# Patient Record
Sex: Male | Born: 1940 | Race: White | Hispanic: No | Marital: Married | Smoking: Former smoker
Health system: Southern US, Community
[De-identification: ages and names within clinical notes are randomized; demographics above are authoritative.]

## PROBLEM LIST (undated history)

## (undated) DIAGNOSIS — M75101 Unspecified rotator cuff tear or rupture of right shoulder, not specified as traumatic: Secondary | ICD-10-CM

## (undated) DIAGNOSIS — I10 Essential (primary) hypertension: Secondary | ICD-10-CM

## (undated) DIAGNOSIS — Z973 Presence of spectacles and contact lenses: Secondary | ICD-10-CM

## (undated) DIAGNOSIS — M12811 Other specific arthropathies, not elsewhere classified, right shoulder: Secondary | ICD-10-CM

## (undated) DIAGNOSIS — G4733 Obstructive sleep apnea (adult) (pediatric): Secondary | ICD-10-CM

## (undated) DIAGNOSIS — R569 Unspecified convulsions: Secondary | ICD-10-CM

## (undated) DIAGNOSIS — T7840XA Allergy, unspecified, initial encounter: Secondary | ICD-10-CM

## (undated) DIAGNOSIS — Z974 Presence of external hearing-aid: Secondary | ICD-10-CM

## (undated) DIAGNOSIS — I495 Sick sinus syndrome: Secondary | ICD-10-CM

## (undated) DIAGNOSIS — D494 Neoplasm of unspecified behavior of bladder: Secondary | ICD-10-CM

## (undated) DIAGNOSIS — Z85828 Personal history of other malignant neoplasm of skin: Secondary | ICD-10-CM

## (undated) DIAGNOSIS — Z8673 Personal history of transient ischemic attack (TIA), and cerebral infarction without residual deficits: Secondary | ICD-10-CM

## (undated) DIAGNOSIS — K219 Gastro-esophageal reflux disease without esophagitis: Secondary | ICD-10-CM

## (undated) DIAGNOSIS — E785 Hyperlipidemia, unspecified: Secondary | ICD-10-CM

## (undated) DIAGNOSIS — Z950 Presence of cardiac pacemaker: Secondary | ICD-10-CM

## (undated) DIAGNOSIS — C801 Malignant (primary) neoplasm, unspecified: Secondary | ICD-10-CM

## (undated) DIAGNOSIS — Z860101 Personal history of adenomatous and serrated colon polyps: Secondary | ICD-10-CM

## (undated) DIAGNOSIS — M1990 Unspecified osteoarthritis, unspecified site: Secondary | ICD-10-CM

## (undated) DIAGNOSIS — Z859 Personal history of malignant neoplasm, unspecified: Secondary | ICD-10-CM

## (undated) DIAGNOSIS — Z86010 Personal history of colonic polyps: Secondary | ICD-10-CM

## (undated) DIAGNOSIS — G459 Transient cerebral ischemic attack, unspecified: Secondary | ICD-10-CM

## (undated) DIAGNOSIS — Z9889 Other specified postprocedural states: Secondary | ICD-10-CM

## (undated) DIAGNOSIS — G4730 Sleep apnea, unspecified: Secondary | ICD-10-CM

## (undated) DIAGNOSIS — R351 Nocturia: Secondary | ICD-10-CM

## (undated) DIAGNOSIS — G3184 Mild cognitive impairment, so stated: Secondary | ICD-10-CM

## (undated) DIAGNOSIS — G2581 Restless legs syndrome: Secondary | ICD-10-CM

## (undated) DIAGNOSIS — Z87898 Personal history of other specified conditions: Secondary | ICD-10-CM

## (undated) HISTORY — DX: Essential (primary) hypertension: I10

## (undated) HISTORY — PX: PACEMAKER PLACEMENT: SHX43

## (undated) HISTORY — DX: Gastro-esophageal reflux disease without esophagitis: K21.9

## (undated) HISTORY — DX: Unspecified osteoarthritis, unspecified site: M19.90

## (undated) HISTORY — DX: Allergy, unspecified, initial encounter: T78.40XA

## (undated) HISTORY — DX: Sleep apnea, unspecified: G47.30

## (undated) HISTORY — DX: Sick sinus syndrome: I49.5

## (undated) HISTORY — DX: Unspecified convulsions: R56.9

## (undated) HISTORY — PX: COLONOSCOPY: SHX174

## (undated) HISTORY — PX: SKIN CANCER EXCISION: SHX779

## (undated) HISTORY — DX: Hyperlipidemia, unspecified: E78.5

## (undated) HISTORY — PX: POLYPECTOMY: SHX149

---

## 2000-07-29 ENCOUNTER — Encounter (INDEPENDENT_AMBULATORY_CARE_PROVIDER_SITE_OTHER): Payer: Self-pay | Admitting: *Deleted

## 2000-07-29 ENCOUNTER — Ambulatory Visit (HOSPITAL_COMMUNITY): Admission: RE | Admit: 2000-07-29 | Discharge: 2000-07-29 | Payer: Self-pay | Admitting: Gastroenterology

## 2003-04-21 DIAGNOSIS — Z87898 Personal history of other specified conditions: Secondary | ICD-10-CM

## 2003-04-21 HISTORY — DX: Personal history of other specified conditions: Z87.898

## 2004-04-23 ENCOUNTER — Ambulatory Visit: Payer: Self-pay | Admitting: Cardiology

## 2004-04-30 ENCOUNTER — Ambulatory Visit: Payer: Self-pay

## 2005-02-17 ENCOUNTER — Ambulatory Visit (HOSPITAL_BASED_OUTPATIENT_CLINIC_OR_DEPARTMENT_OTHER): Admission: RE | Admit: 2005-02-17 | Discharge: 2005-02-17 | Payer: Self-pay | Admitting: Family Medicine

## 2005-02-22 ENCOUNTER — Ambulatory Visit: Payer: Self-pay | Admitting: Internal Medicine

## 2005-08-18 ENCOUNTER — Ambulatory Visit (HOSPITAL_BASED_OUTPATIENT_CLINIC_OR_DEPARTMENT_OTHER): Admission: RE | Admit: 2005-08-18 | Discharge: 2005-08-18 | Payer: Self-pay | Admitting: Family Medicine

## 2005-08-23 ENCOUNTER — Ambulatory Visit: Payer: Self-pay | Admitting: Internal Medicine

## 2007-08-10 ENCOUNTER — Encounter: Payer: Self-pay | Admitting: Family Medicine

## 2007-12-03 ENCOUNTER — Emergency Department (HOSPITAL_COMMUNITY): Admission: EM | Admit: 2007-12-03 | Discharge: 2007-12-03 | Payer: Self-pay | Admitting: Emergency Medicine

## 2008-04-20 DIAGNOSIS — I495 Sick sinus syndrome: Secondary | ICD-10-CM

## 2008-04-20 HISTORY — DX: Sick sinus syndrome: I49.5

## 2008-04-27 LAB — HM COLONOSCOPY

## 2008-05-02 ENCOUNTER — Encounter: Payer: Self-pay | Admitting: Internal Medicine

## 2008-07-17 ENCOUNTER — Ambulatory Visit: Payer: Self-pay | Admitting: Family Medicine

## 2008-07-17 DIAGNOSIS — E785 Hyperlipidemia, unspecified: Secondary | ICD-10-CM | POA: Insufficient documentation

## 2008-07-17 DIAGNOSIS — I10 Essential (primary) hypertension: Secondary | ICD-10-CM

## 2008-07-17 HISTORY — DX: Hyperlipidemia, unspecified: E78.5

## 2008-07-17 HISTORY — DX: Essential (primary) hypertension: I10

## 2008-07-24 ENCOUNTER — Telehealth: Payer: Self-pay | Admitting: Family Medicine

## 2008-07-24 DIAGNOSIS — R55 Syncope and collapse: Secondary | ICD-10-CM | POA: Insufficient documentation

## 2008-07-25 LAB — CONVERTED CEMR LAB
ALT: 18 units/L (ref 0–53)
AST: 24 units/L (ref 0–37)
Albumin: 4 g/dL (ref 3.5–5.2)
Alkaline Phosphatase: 43 units/L (ref 39–117)
BUN: 14 mg/dL (ref 6–23)
Bilirubin, Direct: 0 mg/dL (ref 0.0–0.3)
CO2: 33 meq/L — ABNORMAL HIGH (ref 19–32)
Calcium: 9.3 mg/dL (ref 8.4–10.5)
Chloride: 100 meq/L (ref 96–112)
Cholesterol: 263 mg/dL — ABNORMAL HIGH (ref 0–200)
Creatinine, Ser: 0.8 mg/dL (ref 0.4–1.5)
Direct LDL: 185.1 mg/dL
GFR calc non Af Amer: 103 mL/min (ref >60)
Glucose, Bld: 98 mg/dL (ref 70–99)
HDL: 58 mg/dL (ref >39.00)
PSA: 0.84 ng/mL (ref 0.10–4.00)
Potassium: 4.9 meq/L (ref 3.5–5.1)
Sodium: 138 meq/L (ref 135–145)
Total Bilirubin: 1.1 mg/dL (ref 0.3–1.2)
Total CHOL/HDL Ratio: 5
Total Protein: 7 g/dL (ref 6.0–8.3)
Triglycerides: 104 mg/dL (ref 0.0–149.0)
VLDL: 20.8 mg/dL (ref 0.0–40.0)

## 2008-08-14 ENCOUNTER — Ambulatory Visit: Payer: Self-pay | Admitting: Internal Medicine

## 2008-08-15 LAB — CONVERTED CEMR LAB
BUN: 13 mg/dL (ref 6–23)
Basophils Absolute: 0 10*3/uL (ref 0.0–0.1)
Basophils Relative: 0.5 % (ref 0.0–3.0)
CO2: 32 meq/L (ref 19–32)
Calcium: 8.9 mg/dL (ref 8.4–10.5)
Chloride: 100 meq/L (ref 96–112)
Creatinine, Ser: 0.9 mg/dL (ref 0.4–1.5)
Eosinophils Absolute: 0.1 10*3/uL (ref 0.0–0.7)
Eosinophils Relative: 2.1 % (ref 0.0–5.0)
GFR calc non Af Amer: 89.89 mL/min (ref >60)
Glucose, Bld: 91 mg/dL (ref 70–99)
HCT: 43.5 % (ref 39.0–52.0)
Hemoglobin: 15 g/dL (ref 13.0–17.0)
INR: 1 (ref 0.8–1.0)
Lymphocytes Relative: 24.8 % (ref 12.0–46.0)
Lymphs Abs: 1.5 10*3/uL (ref 0.7–4.0)
MCHC: 34.6 g/dL (ref 30.0–36.0)
MCV: 95.4 fL (ref 78.0–100.0)
Monocytes Absolute: 0.5 10*3/uL (ref 0.1–1.0)
Monocytes Relative: 7.7 % (ref 3.0–12.0)
Neutro Abs: 4 10*3/uL (ref 1.4–7.7)
Neutrophils Relative %: 64.9 % (ref 43.0–77.0)
Platelets: 176 10*3/uL (ref 150.0–400.0)
Potassium: 4.2 meq/L (ref 3.5–5.1)
Prothrombin Time: 10.5 s — ABNORMAL LOW (ref 10.9–13.3)
RBC: 4.56 M/uL (ref 4.22–5.81)
RDW: 13.6 % (ref 11.5–14.6)
Sodium: 138 meq/L (ref 135–145)
WBC: 6.1 10*3/uL (ref 4.5–10.5)
aPTT: 26.2 s (ref 21.7–28.8)

## 2008-08-17 ENCOUNTER — Ambulatory Visit: Payer: Self-pay | Admitting: Internal Medicine

## 2008-08-17 ENCOUNTER — Ambulatory Visit (HOSPITAL_COMMUNITY): Admission: RE | Admit: 2008-08-17 | Discharge: 2008-08-17 | Payer: Self-pay | Admitting: Internal Medicine

## 2008-08-17 HISTORY — PX: LOOP RECORDER IMPLANT: SHX5954

## 2008-08-20 ENCOUNTER — Encounter: Payer: Self-pay | Admitting: Internal Medicine

## 2008-09-03 ENCOUNTER — Encounter: Payer: Self-pay | Admitting: Internal Medicine

## 2008-09-03 ENCOUNTER — Ambulatory Visit: Payer: Self-pay

## 2008-09-05 ENCOUNTER — Telehealth (INDEPENDENT_AMBULATORY_CARE_PROVIDER_SITE_OTHER): Payer: Self-pay | Admitting: *Deleted

## 2008-12-18 ENCOUNTER — Ambulatory Visit: Payer: Self-pay | Admitting: Internal Medicine

## 2008-12-18 DIAGNOSIS — Z950 Presence of cardiac pacemaker: Secondary | ICD-10-CM

## 2008-12-18 HISTORY — DX: Presence of cardiac pacemaker: Z95.0

## 2009-08-02 ENCOUNTER — Encounter: Payer: Self-pay | Admitting: Family Medicine

## 2009-09-30 ENCOUNTER — Ambulatory Visit: Payer: Self-pay | Admitting: Family Medicine

## 2009-10-11 ENCOUNTER — Ambulatory Visit: Payer: Self-pay | Admitting: Family Medicine

## 2009-10-11 DIAGNOSIS — R071 Chest pain on breathing: Secondary | ICD-10-CM | POA: Insufficient documentation

## 2009-11-25 ENCOUNTER — Telehealth: Payer: Self-pay | Admitting: *Deleted

## 2009-12-24 ENCOUNTER — Ambulatory Visit: Payer: Self-pay | Admitting: Internal Medicine

## 2009-12-26 ENCOUNTER — Telehealth: Payer: Self-pay | Admitting: Internal Medicine

## 2010-01-01 ENCOUNTER — Telehealth: Payer: Self-pay | Admitting: Internal Medicine

## 2010-01-13 ENCOUNTER — Ambulatory Visit: Payer: Self-pay | Admitting: Internal Medicine

## 2010-01-16 ENCOUNTER — Encounter: Payer: Self-pay | Admitting: Family Medicine

## 2010-01-20 ENCOUNTER — Ambulatory Visit: Payer: Self-pay | Admitting: Internal Medicine

## 2010-01-20 LAB — CONVERTED CEMR LAB
BUN: 12 mg/dL (ref 6–23)
Basophils Absolute: 0 10*3/uL (ref 0.0–0.1)
Basophils Relative: 0.5 % (ref 0.0–3.0)
CO2: 30 meq/L (ref 19–32)
Calcium: 9 mg/dL (ref 8.4–10.5)
Chloride: 94 meq/L — ABNORMAL LOW (ref 96–112)
Creatinine, Ser: 0.8 mg/dL (ref 0.4–1.5)
Eosinophils Absolute: 0.2 10*3/uL (ref 0.0–0.7)
Eosinophils Relative: 2.9 % (ref 0.0–5.0)
GFR calc non Af Amer: 99.65 mL/min (ref >60)
Glucose, Bld: 101 mg/dL — ABNORMAL HIGH (ref 70–99)
HCT: 42.3 % (ref 39.0–52.0)
Hemoglobin: 14.7 g/dL (ref 13.0–17.0)
INR: 0.9 (ref 0.8–1.0)
Lymphocytes Relative: 22.7 % (ref 12.0–46.0)
Lymphs Abs: 1.9 10*3/uL (ref 0.7–4.0)
MCHC: 34.8 g/dL (ref 30.0–36.0)
MCV: 96.8 fL (ref 78.0–100.0)
Monocytes Absolute: 0.8 10*3/uL (ref 0.1–1.0)
Monocytes Relative: 9 % (ref 3.0–12.0)
Neutro Abs: 5.5 10*3/uL (ref 1.4–7.7)
Neutrophils Relative %: 64.9 % (ref 43.0–77.0)
Platelets: 179 10*3/uL (ref 150.0–400.0)
Potassium: 5.3 meq/L — ABNORMAL HIGH (ref 3.5–5.1)
Prothrombin Time: 10.1 s (ref 9.7–11.8)
RBC: 4.37 M/uL (ref 4.22–5.81)
RDW: 13.8 % (ref 11.5–14.6)
Sodium: 133 meq/L — ABNORMAL LOW (ref 135–145)
WBC: 8.4 10*3/uL (ref 4.5–10.5)
aPTT: 26.4 s (ref 21.7–28.8)

## 2010-01-27 ENCOUNTER — Ambulatory Visit: Payer: Self-pay | Admitting: Internal Medicine

## 2010-01-27 ENCOUNTER — Ambulatory Visit (HOSPITAL_COMMUNITY): Admission: RE | Admit: 2010-01-27 | Discharge: 2010-01-27 | Payer: Self-pay | Admitting: Internal Medicine

## 2010-01-27 HISTORY — PX: CARDIAC PACEMAKER PLACEMENT: SHX583

## 2010-01-28 ENCOUNTER — Telehealth: Payer: Self-pay | Admitting: Internal Medicine

## 2010-01-30 ENCOUNTER — Encounter: Payer: Self-pay | Admitting: Internal Medicine

## 2010-02-12 ENCOUNTER — Ambulatory Visit: Payer: Self-pay

## 2010-02-12 ENCOUNTER — Encounter: Payer: Self-pay | Admitting: Internal Medicine

## 2010-04-28 ENCOUNTER — Ambulatory Visit
Admission: RE | Admit: 2010-04-28 | Discharge: 2010-04-28 | Payer: Self-pay | Source: Home / Self Care | Attending: Internal Medicine | Admitting: Internal Medicine

## 2010-04-28 ENCOUNTER — Encounter: Payer: Self-pay | Admitting: Internal Medicine

## 2010-05-05 ENCOUNTER — Encounter: Payer: Self-pay | Admitting: Internal Medicine

## 2010-05-20 NOTE — Cardiovascular Report (Signed)
Summary: Office Visit   Office Visit   Imported By: Roderic Ovens 02/27/2010 14:36:36  _____________________________________________________________________  External Attachment:    Type:   Image     Comment:   External Document

## 2010-05-20 NOTE — Letter (Signed)
Summary: Implantable Device Instructions  Architectural technologist, Main Office  [address]   [place], [place] [address]   Phone: [phone]  Fax: [phone]      Implantable Device Instructions  You are scheduled for:  _X____ Permanent Transvenous Pacemaker REMOVAL OF LOOP RECORDER _____ Implantable Cardioverter Defibrillator _____ Implantable Loop Recorder _____ Generator Change  on _10/10/11____ with Dr. __KLEIN___.  1.  Please arrive at the Short Stay Center at [facility] at _8:00____ on the day of your procedure.  2.  Do not eat or drink the night before your procedure.  3.  Complete lab work on __10/3/11___.  The lab at [facility] is open from 8:30 AM to 1:30 PM and from 2:30 PM to 5:00 PM.  The lab at [facility] is open from 7:30 AM to 5:30 PM.  You do not have to be fasting.   4.  Plan for an overnight stay.  Bring your insurance cards and a list of your medications.  5.  Wash your chest and neck with antibacterial soap (any brand) the evening before and the morning of your procedure.  Rinse well.  6 .  Education material received:     Pacemaker ___X__           ICD _____           Arrhythmia _____  *If you have ANY questions after you get home, please call the office [phone].  *Every attempt is made to prevent procedures from being rescheduled.  Due to the nauture of Electrophysiology, rescheduling can happen.  The physician is always aware and directs the staff when this occurs.

## 2010-05-20 NOTE — Cardiovascular Report (Signed)
Summary: Office Visit   Office Visit   Imported By: Roderic Ovens 11/12/2008 14:03:04  _____________________________________________________________________  External Attachment:    Type:   Image     Comment:   External Document

## 2010-05-20 NOTE — Progress Notes (Signed)
Summary: Shingles vaccine offered, LMTCB  Phone Note Outgoing Call   Call placed by: Sid Falcon LPN,  November 25, 2009 4:39 PM Call placed to: Patient Summary of Call: Pt on list to get Shingles vaccine. LMTCB if he would like the Zostavax.  Pt was informed on $290.00 and th check with his Insurance Co.  He will need to sign a form agreeing to pay if insurance does not. Initial call taken by: Sid Falcon LPN,  November 25, 2009 4:41 PM  Follow-up for Phone Call        attempt to call - ans mach - LMTCB by Monday 12noon if interested in rec'ing shingles vaccine. If not  we will remove from list  . Informed about cost , calling ins and signing ppwk at time of injections. KIK Follow-up by: Duard Brady LPN,  November 29, 2009 9:29 AM

## 2010-05-20 NOTE — Cardiovascular Report (Signed)
Summary: Asytole Episode  Asytole Episode   Imported By: Marylou Mccoy 01/27/2010 13:53:16  _____________________________________________________________________  External Attachment:    Type:   Image     Comment:   External Document

## 2010-05-20 NOTE — Procedures (Signed)
Summary: 10-14 day pacemaker ck/mt   Current Medications (verified): 1)  Benazepril Hcl 40 Mg Tabs (Benazepril Hcl) .... Once Daily 2)  Centrum Ultra Mens  Tabs (Multiple Vitamins-Minerals) .... Once Daily 3)  Co Q-10 Plus Red Yeast Rice 60-600 Mg Caps (Coenzyme Q10-Red Yeast Rice) .... Once Daily 4)  Vitamin C 500 Mg Tabs (Ascorbic Acid) .Marland Kitchen.. 1 Once Daily 5)  Fish Oil 1000 Mg Caps (Omega-3 Fatty Acids) .... Take 1 Capsule By Mouth Once A Day 6)  Crestor 5 Mg Tabs (Rosuvastatin Calcium) .... Take 1 Tablet By Mouth Every Other Day 7)  Red Yeast Rice 600 Mg Tabs (Red Yeast Rice Extract) .... Take 1 Tablet By Mouth Once A Day 8)  Saw Palmetto 500 Mg Caps (Saw Palmetto (Serenoa Repens)) .... Take 1 Capsule By Mouth Once A Day 9)  Naproxen Sodium 220 Mg Tabs (Naproxen Sodium) .... Once Daily 10)  Glucosamine-Chondroitin 1500-1200 Mg/11ml Liqd (Glucosamine-Chondroitin) .... Once Daily 11)  Super B-50 B Complex  Caps (B Complex-Biotin-Fa) .Marland Kitchen.. 1 Once Daily 12)  Prostate Therapy Complex  Caps (Misc Natural Products) .Marland Kitchen.. 1 Once Daily  Allergies (verified): No Known Drug Allergies  PPM Specifications Following MD:  Sherryl Manges, MD     PPM Vendor:  St Jude     PPM Model Number:  [id number]     PPM Serial Number:  [id number] PPM DOI:  01/27/2010     PPM Implanting MD:  Sherryl Manges, MD  Lead 1    Location: RA     DOI: 01/27/2010     Model #: [id number]     Serial #: [id number]     Status: active Lead 2    Location: RV     DOI: 01/27/2010     Model #: [id number]     Serial #: [id number]     Status: active  Magnet Response Rate:  BOL 100 ERI 85  Indications:  Sick sinus syndrome   PPM Follow Up Battery Voltage:  3.01 V     Battery Est. Longevity:  6.7-13.2 yrs       PPM Device Measurements Atrium  Amplitude: 4.7 mV, Impedance: 410 ohms, Threshold: 0.75 V at 0.4 msec Right Ventricle  Amplitude: 12.0 mV, Impedance: 480 ohms, Threshold: 0.75 V at 0.4 msec  Episodes MS Episodes:  0     Ventricular High Rate:   0     Atrial Pacing:  1.9%     Ventricular Pacing:  <1%  Parameters Mode:  DDD     Lower Rate Limit:  50     Upper Rate Limit:  110 Paced AV Delay:  250     Sensed AV Delay:  250 Next Cardiology Appt Due:  04/21/2010 Tech Comments:  WOUND CHECK---STERI STRIPS REMOVED.  NO REDNESS OR SWELLING AT SITE.  NORMAL DEVICE FUNCTION.  NO CHANGES MADE. ROV IN [place] W/SK. Vella Kohler  February 12, 2010 12:54 PM  ILR Following MD Sherryl Manges, MD DOI:  08/17/2008 Vendor:  St Jude     Model Number:  [id number]     Serial Number [id number]

## 2010-05-20 NOTE — Assessment & Plan Note (Signed)
Summary: cpx/pt will come in fasting/njr   Vital Signs:  Patient profile:   70 year old male Height:      68 inches Weight:      182 pounds BMI:     27.77 Temp:     97.7 degrees F oral Pulse rate:   72 / minute Pulse rhythm:   regular Resp:     12 per minute BP sitting:   130 / 72  (left arm) Cuff size:   regular  Vitals Entered By: Sid Falcon LPN (September 30, 2009 10:38 AM)  Nutrition Counseling: Patient's BMI is greater than 25 and therefore counseled on weight management options. CC: CPX, pt fasting   History of Present Illness: Here for CPE.  Colonoscopy 2004.  Tetanus up to date.  PVX 2 years ago. No Zostavax.  Exercises most days.  Hx of syncopal episodes but none recently.   Workup per cardiology-s/p loop recorder implantation.  Recent labs per [place] reviewed and no signif abnormality noted.  PMH, SH , AND FH REVIEWED.  Allergies (verified): No Known Drug Allergies  Past History:  Past Medical History: Last updated: 08/20/2008 Hyperlipidemia Hypertension hearing loss Sleep Apnea 2007  Family History: Last updated: 07/17/2008 Family History Hypertension parent Alcoholism, grandparent Stroke, parent  Social History: Last updated: 08/07/2008 Occupation:  Psychologist, sport and exercise Alcohol use-yes--2- beers every day Regular exercise-yes Tobacco Use - Former. --quit 1984  Risk Factors: Exercise: yes (07/17/2008)  Risk Factors: Smoking Status: quit (08/07/2008)  Review of Systems  The patient denies anorexia, fever, weight loss, weight gain, vision loss, decreased hearing, hoarseness, chest pain, syncope, dyspnea on exertion, peripheral edema, prolonged cough, headaches, hemoptysis, abdominal pain, melena, hematochezia, severe indigestion/heartburn, hematuria, incontinence, genital sores, muscle weakness, suspicious skin lesions, transient blindness, difficulty walking, depression, unusual weight change, enlarged lymph nodes, and testicular masses.    Physical  Exam  General:  Well-developed,well-nourished,in no acute distress; alert,appropriate and cooperative throughout examination Head:  Normocephalic and atraumatic without obvious abnormalities. No apparent alopecia or balding. Eyes:  pupils equal, pupils round, and pupils reactive to light.   Ears:  External ear exam shows no significant lesions or deformities.  Otoscopic examination reveals clear canals, tympanic membranes are intact bilaterally without bulging, retraction, inflammation or discharge. Hearing is grossly normal bilaterally. Mouth:  Oral mucosa and oropharynx without lesions or exudates.  Teeth in good repair. Neck:  No deformities, masses, or tenderness noted. Lungs:  Normal respiratory effort, chest expands symmetrically. Lungs are clear to auscultation, no crackles or wheezes. Heart:  Normal rate and regular rhythm. S1 and S2 normal without gallop, murmur, click, rub or other extra sounds. Abdomen:  Bowel sounds positive,abdomen soft and non-tender without masses, organomegaly or hernias noted. Rectal:  No external abnormalities noted. Normal sphincter tone. No rectal masses or tenderness. Prostate:  Prostate gland firm and smooth, no enlargement, nodularity, tenderness, mass, asymmetry or induration. Extremities:  No clubbing, cyanosis, edema, or deformity noted with normal full range of motion of all joints.     Impression & Recommendations:  Problem # 1:  ROUTINE GENERAL MEDICAL EXAM@HEALTH  CARE FACL (ICD-V70.0)  Complete Medication List: 1)  Benazepril Hcl 40 Mg Tabs (Benazepril hcl) .... Once daily 2)  Centrum Ultra Mens Tabs (Multiple vitamins-minerals) .... Once daily 3)  Co Q-10 Plus Red Yeast Rice 60-600 Mg Caps (Coenzyme q10-red yeast rice) .... Once daily 4)  Vitamin C Cr 1000 Mg Cr-tabs (Ascorbic acid) .... Once daily 5)  Fish Oil 1000 Mg Caps (Omega-3 fatty acids) .... Take  1 capsule by mouth once a day 6)  Crestor 10 Mg Tabs (Rosuvastatin calcium) .... Take one  tablet by mouth every other day 7)  Red Yeast Rice 600 Mg Tabs (Red yeast rice extract) .... Take 1 tablet by mouth once a day 8)  Saw Palmetto 500 Mg Caps (Saw palmetto (serenoa repens)) .... Take 1 capsule by mouth once a day 9)  Naproxen Sodium 220 Mg Tabs (Naproxen sodium) .... Once daily 10)  Glucosamine-chondroitin 1500-1200 Mg/32ml Liqd (Glucosamine-chondroitin) .... Once daily  Patient Instructions: 1)  It is important that you exercise reguarly at least 20 minutes 5 times a week. If you develop chest pain, have severe difficulty breathing, or feel very tired, stop exercising immediately and seek medical attention.

## 2010-05-20 NOTE — Cardiovascular Report (Signed)
Summary: Office Visit   Office Visit   Imported By: Roderic Ovens 01/14/2010 15:01:19  _____________________________________________________________________  External Attachment:    Type:   Image     Comment:   External Document

## 2010-05-20 NOTE — Progress Notes (Signed)
Summary: echo results  Phone Note Outgoing Call Call back at [facility] Phone [phone]   Call placed by: Gypsy Balsam RN BSN,  Sep 05, 2008 10:38 AM Call placed to: Patient Summary of Call: Pt's spouse notified of echo results Initial call taken by: Gypsy Balsam RN BSN,  Sep 05, 2008 10:38 AM

## 2010-05-20 NOTE — Assessment & Plan Note (Signed)
Summary: 3 month/dmp   Visit Type:  Follow-up Primary Provider:  Evelena Peat MD  CC:  device check.  History of Present Illness:     [name] [name] is seen because of recurrent syncope. He has had 3 episodes dating back to last 5 years.    he is s/p loop recorder implantation and is without recurrent events  Current Medications (verified): 1)  Benazepril Hcl 40 Mg Tabs (Benazepril Hcl) .... Once Daily 2)  Centrum Ultra Mens  Tabs (Multiple Vitamins-Minerals) .... Once Daily 3)  Co Q-10 Plus Red Yeast Rice 60-600 Mg Caps (Coenzyme Q10-Red Yeast Rice) .... Once Daily 4)  Vitamin C Cr 1000 Mg Cr-Tabs (Ascorbic Acid) .... Once Daily 5)  Fish Oil 1000 Mg Caps (Omega-3 Fatty Acids) .... Take 1 Capsule By Mouth Once A Day 6)  Crestor 10 Mg Tabs (Rosuvastatin Calcium) .... Take One Tablet By Mouth Every Other Day 7)  Red Yeast Rice 600 Mg Tabs (Red Yeast Rice Extract) .... Take 1 Tablet By Mouth Once A Day 8)  Saw Palmetto 500 Mg Caps (Saw Palmetto (Serenoa Repens)) .... Take 1 Capsule By Mouth Once A Day  Allergies (verified): No Known Drug Allergies  Vital Signs:  Patient profile:   70 year old male Height:      68 inches Weight:      177 pounds BMI:     27.28 Pulse rate:   68 / minute Pulse rhythm:   regular Resp:     18 per minute BP sitting:   112 / 66  (left arm) Cuff size:   large  Vitals Entered By: Vikki Ports (December 18, 2008 10:33 AM)  Physical Exam  General:  The patient was alert and oriented in no acute distress. Neck veins were flat, carotids were brisk.  Lungs were clear.  Heart sounds were regular without murmurs or gallops.  Abdomen was soft with active bowel sounds. There is no clubbing cyanosis or edema. Skin Warm and dry\par     ILR Following MD Sherryl Manges, MD DOI:  08/17/2008 Vendor:  [employer] Jude     Model Number:  [id number]     Serial Number [id number]       Tachy Episodes:  1     Brady Episodes:  72 ILR Next Due 03/20/2009  Tech Comments:  All  bradycardia and asystole episodes are undersensing.  Dr Graciela Husbands to interpret tachy episode. Gypsy Balsam RN BSN  December 18, 2008 10:57 AM   Impression & Recommendations:  Problem # 1:  SYNCOPE (ICD-780.2) No recurrences His updated medication list for this problem includes:    Benazepril Hcl 40 Mg Tabs (Benazepril hcl) ..... Once daily  Problem # 2:  IMPLANTABLE LOOP RECORDER (ICD-V45.09) interrogation demonstrated multiple events, both undersensing and oversensing.  There was no syncope and I am interpreting all of the events as artifact.  I have advised him that coming for syncope is important and general surveillance will likely be of little benefit because of the sensing issues  Patient Instructions: 1)  Your physician recommends that you schedule a follow-up appointment in: 12 months
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
## 2010-05-20 NOTE — Miscellaneous (Signed)
Summary: Living Will and Healthcare Power of Attorney  Living Will and Healthcare Power of Attorney   Imported By: Maryln Gottron 01/31/2010 12:21:30  _____________________________________________________________________  External Attachment:    Type:   Image     Comment:   External Document

## 2010-05-20 NOTE — Progress Notes (Signed)
Summary: Question about pt visit and pacemaker  Phone Note Call from Patient Call back at Home Phone [phone] Call back at [phone]   Caller: Spouse/Peggy Summary of Call: Ptv wife calling regarding the pt visit  on 11/23/09 calling with question about pacemaker Initial call taken by: Judie Grieve,  December 26, 2009 2:43 PM  Follow-up for Phone Call        12/26/09--3PM--dr klein and christine--had a coversation today with mr and mrs Mirabella, who is sched for pacer implant on 01/27/10--this couple do not appear to feel mr Spellman needs pacer--they had general ? about electrical activity of heart ( is dr Graciela Husbands sure that mr Lobato's heart accually stops--could the recorder be broken--how can he feel so good, be able to exercise without any problems--he hasn't had syncope since 2010, could the disorder be gone--etc--they don't seem to feel that he needs the pacer right now--i attempted to answer their ?, but feel they don't want to have this done--i advised i would speak to dr Graciela Husbands and cristine to see what they suggest--this couple also asked if they should get second opinion--i spoke with dr Graciela Husbands about all of the above and he stated he would call this family--nt Follow-up by: Ledon Snare, RN,  December 26, 2009 3:26 PM

## 2010-05-20 NOTE — Procedures (Signed)
Summary: eph./ wound check. gd   Current Medications (verified): 1)  Benazepril Hcl 40 Mg Tabs (Benazepril Hcl) .... Once Daily 2)  Omega-3 350 Mg Caps (Omega-3 Fatty Acids) .... Once Daily 3)  Centrum Ultra Mens  Tabs (Multiple Vitamins-Minerals) .... Once Daily 4)  Co Q-10 Plus Red Yeast Rice 60-600 Mg Caps (Coenzyme Q10-Red Yeast Rice) .... Once Daily 5)  Vitamin C Cr 1000 Mg Cr-Tabs (Ascorbic Acid) .... Once Daily 6)  Crestor 5 Mg Tabs (Rosuvastatin Calcium) .... Take One Tablet By Mouth Every Other Day  Allergies (verified): 1)  ! * No Shellfish Allergy 2)  ! * No Latex Allergy 3)  ! * No Shellfish Allergy   ILR   Tachy Episodes:  1     Brady Episodes:  72  Tech Comments:  All episodes noise.   Steri-strips removed by patient.  No redness or edema. Altha Harm, LPN  Sep 03, 2008 2:50 PM

## 2010-05-20 NOTE — Miscellaneous (Signed)
Summary: Device change out  Clinical Lists Changes  Observations: Added new observation of PPM INDICATN: Sick sinus syndrome (01/30/2010 8:47) Added new observation of MAGNET RTE: BOL 100 ERI 85 (01/30/2010 8:47) Added new observation of PPMLEADSTAT2: active (01/30/2010 8:47) Added new observation of PPMLEADSER2: [id number] (01/30/2010 8:47) Added new observation of PPMLEADMOD2: 2088TC (01/30/2010 8:47) Added new observation of PPMLEADLOC2: RV (01/30/2010 8:47) Added new observation of PPMLEADSTAT1: active (01/30/2010 8:47) Added new observation of PPMLEADSER1: [id number] (01/30/2010 8:47) Added new observation of PPMLEADMOD1: 2088TC (01/30/2010 8:47) Added new observation of PPMLEADLOC1: RA (01/30/2010 8:47) Added new observation of PPM IMP MD: Sherryl Manges, MD (01/30/2010 8:47) Added new observation of PPMLEADDOI2: 01/27/2010 (01/30/2010 8:47) Added new observation of PPMLEADDOI1: 01/27/2010 (01/30/2010 8:47) Added new observation of PPM DOI: 01/27/2010 (01/30/2010 8:47) Added new observation of PPM SERL#: [id number]  (01/30/2010 8:47) Added new observation of PPM MODL#: [id number]  (01/30/2010 8:41) Added new observation of PACEMAKERMFG: St Jude  (01/30/2010 8:47) Added new observation of PACEMAKER MD: Sherryl Manges, MD  (01/30/2010 8:47) Added new observation of ILR TECHCOMM: 01/27/10 St. Jude [id number] explanted  (01/30/2010 8:47)      PPM Specifications Following MD:  Sherryl Manges, MD     PPM Vendor:  St Jude     PPM Model Number:  [id number]     PPM Serial Number:  [id number] PPM DOI:  01/27/2010     PPM Implanting MD:  Sherryl Manges, MD  Lead 1    Location: RA     DOI: 01/27/2010     Model #: [id number]     Serial #: [id number]     Status: active Lead 2    Location: RV     DOI: 01/27/2010     Model #: [id number]     Serial #: [id number]     Status: active  Magnet Response Rate:  BOL 100 ERI 85  Indications:  Sick sinus syndrome   ILR Following MD Sherryl Manges, MD DOI:   08/17/2008 Vendor:  St Jude     Model Number:  [id number]     Serial Number [id number]        Tech Comments:  01/27/10 St. Jude [id number] explanted

## 2010-05-20 NOTE — Letter (Signed)
Summary: Records from [facility] 2004 - 2009  Records from [facility] 2004 - 2009   Imported By: Maryln Gottron 08/29/2008 09:50:18  _____________________________________________________________________  External Attachment:    Type:   Image     Comment:   External Document

## 2010-05-20 NOTE — Assessment & Plan Note (Signed)
Summary: ?chest congestion/hurts to take deep breath/pt req to get che...   Vital Signs:  Patient profile:   70 year old male Temp:     98.0 degrees F oral BP sitting:   140 / 72  (left arm) Cuff size:   regular  Vitals Entered By: Sid Falcon LPN (October 11, 2009 3:41 PM) CC: Chest congestion, hurts to take  a breath   History of Present Illness: Yesterday pain with deep breath.  Location L upper chest. No dyspnea.  No chest pain or tightness. Not exertional .  Exercising OK-eliptical 45 minues today without difficulty.  No cough.  No hemoptysis.  Sore to touch. Does some weights.  Better today.  No known injury.  Allergies (verified): No Known Drug Allergies  Past History:  Past Medical History: Last updated: 08/20/2008 Hyperlipidemia Hypertension hearing loss Sleep Apnea 2007  Review of Systems  The patient denies fever, hoarseness, chest pain, syncope, dyspnea on exertion, peripheral edema, prolonged cough, hemoptysis, and abdominal pain.    Physical Exam  General:  Well-developed,well-nourished,in no acute distress; alert,appropriate and cooperative throughout examination Head:  Normocephalic and atraumatic without obvious abnormalities. No apparent alopecia or balding. Ears:  External ear exam shows no significant lesions or deformities.  Otoscopic examination reveals clear canals, tympanic membranes are intact bilaterally without bulging, retraction, inflammation or discharge. Hearing is grossly normal bilaterally. Mouth:  Oral mucosa and oropharynx without lesions or exudates.  Teeth in good repair. Neck:  No deformities, masses, or tenderness noted. Chest Wall:  slisghtly tender L upper chest wall without any rashes. Lungs:  Normal respiratory effort, chest expands symmetrically. Lungs are clear to auscultation, no crackles or wheezes. Heart:  Normal rate and regular rhythm. S1 and S2 normal without gallop, murmur, click, rub or other extra sounds. Abdomen:  soft  and non-tender.     Impression & Recommendations:  Problem # 1:  CHEST WALL PAIN, ACUTE (ICD-786.52) suspsect musculoskeletal. His updated medication list for this problem includes:    Naproxen Sodium 220 Mg Tabs (Naproxen sodium) ..... Once daily  Complete Medication List: 1)  Benazepril Hcl 40 Mg Tabs (Benazepril hcl) .... Once daily 2)  Centrum Ultra Mens Tabs (Multiple vitamins-minerals) .... Once daily 3)  Co Q-10 Plus Red Yeast Rice 60-600 Mg Caps (Coenzyme q10-red yeast rice) .... Once daily 4)  Vitamin C Cr 1000 Mg Cr-tabs (Ascorbic acid) .... Once daily 5)  Fish Oil 1000 Mg Caps (Omega-3 fatty acids) .... Take 1 capsule by mouth once a day 6)  Crestor 10 Mg Tabs (Rosuvastatin calcium) .... Take one tablet by mouth every other day 7)  Red Yeast Rice 600 Mg Tabs (Red yeast rice extract) .... Take 1 tablet by mouth once a day 8)  Saw Palmetto 500 Mg Caps (Saw palmetto (serenoa repens)) .... Take 1 capsule by mouth once a day 9)  Naproxen Sodium 220 Mg Tabs (Naproxen sodium) .... Once daily 10)  Glucosamine-chondroitin 1500-1200 Mg/61ml Liqd (Glucosamine-chondroitin) .... Once daily  Patient Instructions: 1)  Increase Naproxen to twice daily. 2)  Consider topical ice as needed. 3)  Touch base by next week if no better.

## 2010-05-20 NOTE — Progress Notes (Signed)
  Phone Note Call from Patient Call back at [phone]   Caller: Patient- voice mail Reason for Call: Talk to Doctor Summary of Call: Returning Dr Burchette's call from last week. Thanks. Initial call taken by: Warnell Forester,  July 24, 2008 9:02 AM  Follow-up for Phone Call        Spoke with patient.  He has had recurrent syncope of unknown etiology.  First episode about 5 years ago with second episode 01/2008.  Most recent episode 07-12-2008.  No warning sxs.  Prior workup including stress test, holter monitor, carotid dopplers unrevealing.  Recommend referral to Dr. Berton Mount with [facility] for consideration for Tilt Table and possible other testing.  Patient agrees. Follow-up by: Evelena Peat MD,  July 25, 2008 1:03 PM  Additional Follow-up for Phone Call Additional follow up Details #1::        Phone call to patient today and no further syncopal episodes.  He is aware of f/u with cardiologist on April 27. Additional Follow-up by: Evelena Peat MD,  July 31, 2008 8:22 AM  New Problems: SYNCOPE (ICD-780.2)   New Problems: SYNCOPE (ICD-780.2)

## 2010-05-20 NOTE — Assessment & Plan Note (Signed)
Summary: loop check.[email or web address].[email or web address]   Primary Provider:  Evelena Peat MD   History of Present Illness:     [name] [name] is seen because of recurrent syncope. He has had 3 episodes dating back to last 5 years.    he is s/p loop recorder implantation and is without recurrent events  The patient denies SOB, chest pain, edema or palpitations   Current Medications (verified): 1)  Benazepril Hcl 40 Mg Tabs (Benazepril Hcl) .... Once Daily 2)  Centrum Ultra Mens  Tabs (Multiple Vitamins-Minerals) .... Once Daily 3)  Co Q-10 Plus Red Yeast Rice 60-600 Mg Caps (Coenzyme Q10-Red Yeast Rice) .... Once Daily 4)  Vitamin C 500 Mg Tabs (Ascorbic Acid) .Marland Kitchen.. 1 Once Daily 5)  Fish Oil 1000 Mg Caps (Omega-3 Fatty Acids) .... Take 1 Capsule By Mouth Once A Day 6)  Crestor 5 Mg Tabs (Rosuvastatin Calcium) .Marland Kitchen.. 1 Once Daily 7)  Red Yeast Rice 600 Mg Tabs (Red Yeast Rice Extract) .... Take 1 Tablet By Mouth Once A Day 8)  Saw Palmetto 500 Mg Caps (Saw Palmetto (Serenoa Repens)) .... Take 1 Capsule By Mouth Once A Day 9)  Naproxen Sodium 220 Mg Tabs (Naproxen Sodium) .... Once Daily 10)  Glucosamine-Chondroitin 1500-1200 Mg/31ml Liqd (Glucosamine-Chondroitin) .... Once Daily 11)  Super B-50 B Complex  Caps (B Complex-Biotin-Fa) .Marland Kitchen.. 1 Once Daily 12)  Prostate Therapy Complex  Caps (Misc Natural Products) .Marland Kitchen.. 1 Once Daily  Allergies (verified): No Known Drug Allergies  Past History:  Past Medical History: Last updated: 08/20/2008 Hyperlipidemia Hypertension hearing loss Sleep Apnea 2007  Family History: Last updated: 07/17/2008 Family History Hypertension parent Alcoholism, grandparent Stroke, parent  Social History: Last updated: 08/07/2008 Occupation:  Psychologist, sport and exercise Alcohol use-yes--2- beers every day Regular exercise-yes Tobacco Use - Former. --quit 1984  Vital Signs:  Patient profile:   70 year old male Height:      68 inches Weight:      177.8 pounds BMI:     27.13 Pulse rate:   73  / minute Pulse rhythm:   regular BP sitting:   138 / 72  (right arm) Cuff size:   regular  Vitals Entered By: Judithe Modest CMA (December 24, 2009 12:04 PM)  Physical Exam  General:  The patient was alert and oriented in no acute distress. HEENT Normal.  Neck veins were flat, carotids were brisk.  Lungs were clear.  Heart sounds were regular without murmurs or gallops.  Abdomen was soft with active bowel sounds. There is no clubbing cyanosis or edema. Skin Warm and dry     ILR Following MD Sherryl Manges, MD DOI:  08/17/2008 Vendor:  St Jude     Model Number:  [id number]     Serial Number [id number]       Tachy Episodes:  1     Brady Episodes:  1 ILR Next Due 03/20/2010  Tech Comments:  1 asystole episode shows a pause of 5 seconds.  [name] [name] has not had any syncopal episodes.  ROV 3 months clinic. Altha Harm, LPN  December 24, 2009 12:05 PM   Impression & Recommendations:  Problem # 1:  SYNCOPE (ICD-780.2) interrogation of his loop recorder today demonstrates a 5.7 second pause interrupted by junctional beat and followed then by a slowed sinus return. It is a slow sinus return beat that makes me thiink  that this episode out of many "asystole" episodes is in fact real . I have reviewed this with the patient. Will plan  to proceed with loop recorder extraction and dual-chamber pacemaker implantation. I have reviewed the potential benefits as well as risks including but not limited to death infection they decide that he understands these risks and is willing to proceed His updated medication list for this problem includes:    Benazepril Hcl 40 Mg Tabs (Benazepril hcl) ..... Once daily  Problem # 2:  IMPLANTABLE LOOP RECORDER (ICD-V45.09) as above  Problem # 3:  HYPERTENSION (ICD-401.9) stable His updated medication list for this problem includes:    Benazepril Hcl 40 Mg Tabs (Benazepril hcl) ..... Once daily

## 2010-05-20 NOTE — Letter (Signed)
Summary: Tachy Episode  Tachy Episode   Imported By: Kassie Mends 01/09/2009 11:28:45  _____________________________________________________________________  External Attachment:    Type:   Image     Comment:   External Document

## 2010-05-20 NOTE — Progress Notes (Signed)
Summary: pt wants to talk Dr. Marikay Alar  Phone Note Call from Patient Call back at [phone] or [phone]   Caller: Patient Reason for Call: Talk to Nurse, Talk to Doctor Summary of Call: pt still has not heard from Dr. Graciela Husbands and he really would like to talk to him about the procedure and would like to either make an appt or talk to him on the phone which ever is easier but he wants to so it asap Initial call taken by: Omer Jack,  January 01, 2010 11:20 AM  Follow-up for Phone Call        spoke w/pt and he will be out of town next week and really wants to speak w/md regarding pause found on loop recorder. rqst an appointment 9/26 to speak with dr. Graciela Husbands but no appt available. pt stated that dr. Graciela Husbands can call him next week on his cell phone, [phone], and that [place] is 2 hours behind [employer] time. Claris Gladden, RN  Additional Follow-up for Phone Call Additional follow up Details #1::        return office visit scheduled for 9/26 at 4:15, left vox msg to advise pt of appt. Claris Gladden, RN

## 2010-05-20 NOTE — Cardiovascular Report (Signed)
Summary: Implantable Device Registration Form   Implantable Device Registration Form   Imported By: Roderic Ovens 02/27/2010 14:37:38  _____________________________________________________________________  External Attachment:    Type:   Image     Comment:   External Document

## 2010-05-20 NOTE — Miscellaneous (Signed)
Summary: Device preload  Clinical Lists Changes  Observations: Added new observation of ILR DTOFINS: 08/17/2008 (08/20/2008 16:52) Added new observation of ILR SERIAL: [id number]  (08/20/2008 16:52) Added new observation of ILR MODEL: [id number]  (08/20/2008 16:52) Added new observation of ILR VENDOR: St Jude  (08/20/2008 16:52)      ILR Vendor:  St Jude     Model Number:  [id number]     Serial Number [id number]     DOI:  08/17/2008

## 2010-05-20 NOTE — Progress Notes (Signed)
Summary: pt requesting pain med  Phone Note Call from Patient   Caller: Patient Reason for Call: Talk to Nurse Summary of Call: pt was dc from hospital yesterday w/o pain med, wants to know if he can get something [name] [phone] Initial call taken by: Glynda Jaeger,  January 28, 2010 9:27 AM  Follow-up for Phone Call        spoke w/pt wife and she adv he has not taken anything and very sore. recommend taking tylenol and to let me know if not effective.  Follow-up by: Claris Gladden RN,  January 28, 2010 9:42 AM

## 2010-05-20 NOTE — Assessment & Plan Note (Signed)
Summary: pt will come in fasting/njr   Vital Signs:  Patient profile:   70 year old male Height:      68 inches Weight:      182 pounds BMI:     27.77 Temp:     97.6 degrees F oral BP sitting:   130 / 68  (left arm) Cuff size:   regular  Vitals Entered By: Sid Falcon LPN (July 17, 2008 9:16 AM)   History of Present Illness: Patient 70 year old gentleman seeing me to establish care. History hypertension hyperlipidemia. He went through a lifeline screening recently and this was significant for carotid ultrasound revealing 15-39% bilateral plaque. He had echocardiogram which showed trace aortic valve regurgitation otherwise normal. His other screening tests were entirely normal. He went to the [facility] back in December of 2009 and had lipids with LDL of 106 HDL 56. He has been on Crestor for several years but had some neck pain which he thought may be attributable to the Cretor and January he stopped Crestor. His neck pains have improved. He's had previous intolerance to Zocor, Lipitor, Z., Pravachol, and Vytorin. With some of those medications he had upset stomach with others he had muscle ache. He would like to check his labs today off Crestor to see where his lipids stand.  He's not had any problems recently with chest pains or shortness of breath. He takes benazepril and hydrochlorothiazide for his blood pressure. No orthostatic symptoms.  Preventive Screening-Counseling & Management     Smoking Status: never     Does Patient Exercise: yes  Past History:  Family History:    Family History Hypertension parent    Alcoholism, grandparent    Stroke, parent     (07/17/2008)  Social History:    Occupation:  Psychologist, sport and exercise    Never Smoked    Alcohol use-yes    Regular exercise-yes     (07/17/2008)  Risk Factors:    Alcohol Use: N/A    >5 drinks/d w/in last 3 months: N/A    Caffeine Use: N/A    Diet: N/A    Exercise: yes (07/17/2008)  Risk Factors:    Smoking Status:  never (07/17/2008)    Packs/Day: N/A    Cigars/wk: N/A    Pipe Use/wk: N/A    Cans of tobacco/wk: N/A    Passive Smoke Exposure: N/A  Past Medical History:    Hyperlipidemia    Hypertension  Family History:    Family History Hypertension parent    Alcoholism, grandparent    Stroke, parent  Social History:    Occupation:  Psychologist, sport and exercise    Never Smoked    Alcohol use-yes    Regular exercise-yes    Occupation:  employed    Smoking Status:  never    Does Patient Exercise:  yes  Review of Systems  The patient denies weight loss, weight gain, vision loss, chest pain, dyspnea on exertion, peripheral edema, prolonged cough, headaches, hemoptysis, and abdominal pain.    Physical Exam  General:  Well-developed,well-nourished,in no acute distress; alert,appropriate and cooperative throughout examination Head:  Normocephalic and atraumatic without obvious abnormalities. No apparent alopecia or balding. Mouth:  Oral mucosa and oropharynx without lesions or exudates.  Teeth in good repair. Neck:  No deformities, masses, or tenderness noted. Lungs:  Normal respiratory effort, chest expands symmetrically. Lungs are clear to auscultation, no crackles or wheezes. Heart:  Normal rate and regular rhythm. S1 and S2 normal without gallop, murmur, click, rub or other extra  sounds. Pulses:  R and L carotid,radial,femoral,dorsalis pedis and posterior tibial pulses are full and equal bilaterally Extremities:  No clubbing, cyanosis, edema, or deformity noted with normal full range of motion of all joints.   Neurologic:  No cranial nerve deficits noted. Station and gait are normal. Plantar reflexes are down-going bilaterally. DTRs are symmetrical throughout. Sensory, motor and coordinative functions appear intact.   Impression & Recommendations:  Problem # 1:  HYPERTENSION (ICD-401.9)  Stable. Continue current medications. His updated medication list for this problem includes:    Benazepril Hcl 40  Mg Tabs (Benazepril hcl) ..... Once daily    Hydrochlorothiazide 25 Mg Tabs (Hydrochlorothiazide) ..... Once daily  Orders: TLB-BMP (Basic Metabolic Panel-BMET) (80048-METABOL) Venipuncture (16109)  Problem # 2:  HYPERLIPIDEMIA (ICD-272.4)  Possible side effects from Crestor. His pain it was very localized and I would like to check lipids stay off medication but suspect we'll need to start back Crestor possibly a reduced dosage of 5 mg daily  Orders: TLB-Lipid Panel (80061-LIPID) TLB-BMP (Basic Metabolic Panel-BMET) (80048-METABOL) TLB-Hepatic/Liver Function Pnl (80076-HEPATIC) Venipuncture (60454)  Problem # 3:  Screening PSA (ICD-V76.44) Patient requests a screening PSA today. He has not had this in over a year.  Complete Medication List: 1)  Benazepril Hcl 40 Mg Tabs (Benazepril hcl) .... Once daily 2)  Hydrochlorothiazide 25 Mg Tabs (Hydrochlorothiazide) .... Once daily 3)  Aspirin Adult Low Strength 81 Mg Tbec (Aspirin) .... Once daily 4)  Coq10 200 Mg Caps (Coenzyme q10) .... Once daily 5)  Omega-3 350 Mg Caps (Omega-3 fatty acids) .... Once daily 6)  Centrum Ultra Mens Tabs (Multiple vitamins-minerals) .... Once daily 7)  Co Q-10 Plus Red Yeast Rice 60-600 Mg Caps (Coenzyme q10-red yeast rice) .... Once daily 8)  Vitamin C Cr 1000 Mg Cr-tabs (Ascorbic acid) .... Once daily  Other Orders: TLB-PSA (Prostate Specific Antigen) (84153-PSA)  Patient Instructions: 1)  Please schedule a follow-up appointment in 6 months .  2)  It is important that you exercise reguarly at least 20 minutes 5 times a week. If you develop chest pain, have severe difficulty breathing, or feel very tired, stop exercising immediately and seek medical attention.   Preventive Care Screening  Last Pneumovax:    Date:  04/21/2007    Results:  given   Last Tetanus Booster:    Date:  05/21/2006    Results:  Td    Appended Document: Preload-Problems-Medications-Allergies      Past  History:  Past Medical History:    Hyperlipidemia    Hypertension    hearing loss    Sleep Apnea 2007   Colonoscopy Result Date:  07/20/2002 Colonoscopy Result:  normal     Preventive Care Screening  Colonoscopy:    Date:  07/20/2002    Next Due:  07/19/2012    Results:  normal  Last Tetanus Booster:    Date:  11/03/2007    Results:  Tdap

## 2010-05-20 NOTE — Assessment & Plan Note (Signed)
Summary: ROV-ADDITIONAL ?S   Visit Type:  ROV Primary Provider:  Evelena Peat MD  CC:  no complaints.  History of Present Illness:     [name] [name] is seen because of recurrent syncope. He has had 3 episodes dating back to last 5 years.    he is s/p loop recorder implantation and is without recurrent events  The patient denies SOB, chest pain, edema or palpitations   Current Medications (verified): 1)  Benazepril Hcl 40 Mg Tabs (Benazepril Hcl) .... Once Daily 2)  Centrum Ultra Mens  Tabs (Multiple Vitamins-Minerals) .... Once Daily 3)  Co Q-10 Plus Red Yeast Rice 60-600 Mg Caps (Coenzyme Q10-Red Yeast Rice) .... Once Daily 4)  Vitamin C 500 Mg Tabs (Ascorbic Acid) .Marland Kitchen.. 1 Once Daily 5)  Fish Oil 1000 Mg Caps (Omega-3 Fatty Acids) .... Take 1 Capsule By Mouth Once A Day 6)  Crestor 5 Mg Tabs (Rosuvastatin Calcium) .Marland Kitchen.. 1 Once Daily 7)  Red Yeast Rice 600 Mg Tabs (Red Yeast Rice Extract) .... Take 1 Tablet By Mouth Once A Day 8)  Saw Palmetto 500 Mg Caps (Saw Palmetto (Serenoa Repens)) .... Take 1 Capsule By Mouth Once A Day 9)  Naproxen Sodium 220 Mg Tabs (Naproxen Sodium) .... Once Daily 10)  Glucosamine-Chondroitin 1500-1200 Mg/16ml Liqd (Glucosamine-Chondroitin) .... Once Daily 11)  Super B-50 B Complex  Caps (B Complex-Biotin-Fa) .Marland Kitchen.. 1 Once Daily 12)  Prostate Therapy Complex  Caps (Misc Natural Products) .Marland Kitchen.. 1 Once Daily  Allergies (verified): No Known Drug Allergies  Vital Signs:  Patient profile:   70 year old male Height:      68 inches Weight:      180.25 pounds BMI:     27.51 Pulse rate:   62 / minute BP sitting:   136 / 72  (left arm)  Vitals Entered By: Caralee Ates CMA (January 13, 2010 4:22 PM)  Physical Exam  General:  The patient was alert and oriented in no acute distress. HEENT Normal.  Neck veins were flat, carotids were brisk.  Lungs were clear.  Heart sounds were regular without murmurs or gallops.  Abdomen was soft with active bowel  sounds. There is no clubbing cyanosis or edema. Skin Warm and dry     ILR Following MD Sherryl Manges, MD DOI:  08/17/2008 Vendor:  [employer] Jude     Model Number:  [id number]     Serial Number [id number]        Impression & Recommendations:  Problem # 1:  SYNCOPE (ICD-780.2) the patient has sinus node dysfunction identified by loop recorder with antecednet syncope  there was a five second pause.  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  we will remove loop recorder at the same time His updated medication list for this problem includes:    Benazepril Hcl 40 Mg Tabs (Benazepril hcl) ..... Once daily  Problem # 2:  SINUS ARREST (ICD-427.81) as above His updated medication list for this problem includes:    Benazepril Hcl 40 Mg Tabs (Benazepril hcl) ..... Once daily  Problem # 3:  IMPLANTABLE LOOP RECORDER (ICD-V45.09) Device parameters and data were reviewed and no changes were made

## 2010-05-22 NOTE — Cardiovascular Report (Signed)
Summary: Office Visit   Office Visit   Imported By: Roderic Ovens 05/12/2010 11:13:05  _____________________________________________________________________  External Attachment:    Type:   Image     Comment:   External Document

## 2010-05-22 NOTE — Cardiovascular Report (Signed)
Summary: Office Visit   Office Visit   Imported By: Roderic Ovens 05/12/2010 16:19:41  _____________________________________________________________________  External Attachment:    Type:   Image     Comment:   External Document

## 2010-05-22 NOTE — Assessment & Plan Note (Signed)
Summary: eph/3 month per after hours/mt   Visit Type:  PPM-St.Jude Primary Provider:  Evelena Peat MD  CC:  no complaints.  History of Present Illness:     [name] is seen because of recurrent syncope. He has had 3 episodes dating back to last 5 years.    he had undergone loop recorder implant which demonstrated sinus node dysfunction and he underwent PPM implantation in OCT of this year with loop explant.    The patient denies SOB, chest pain, edema or palpitations  He has had no lightheaded spells. There've been no device issues.  Problems Prior to Update: 1)  Noise On The Atrial Lead  (ICD-996.01) 2)  Sinus Arrest  (ICD-427.81) 3)  Chest Wall Pain, Acute  (ICD-786.52) 4)  Routine General Medical Exam@[facility]  (ICD-V70.0) 5)  Pacemaker, Permanent  (ICD-V45.01) 6)  Hypertension  (ICD-401.9) 7)  Hyperlipidemia  (ICD-272.4) 8)  Syncope  (ICD-780.2) 9)  Special Screening Malignant Neoplasm of Prostate  (ICD-V76.44)  Current Medications (verified): 1)  Benazepril Hcl 40 Mg Tabs (Benazepril Hcl) .... 1/2 Daily 2)  Centrum Ultra Mens  Tabs (Multiple Vitamins-Minerals) .... Once Daily 3)  Co Q-10 Plus Red Yeast Rice 60-600 Mg Caps (Coenzyme Q10-Red Yeast Rice) .... Once Daily 4)  Vitamin C 500 Mg Tabs (Ascorbic Acid) .Marland Kitchen.. 1 Once Daily 5)  Fish Oil 1000 Mg Caps (Omega-3 Fatty Acids) .... Take 1 Capsule By Mouth Once A Day 6)  Crestor 5 Mg Tabs (Rosuvastatin Calcium) .... Take 1 Tablet By Mouth Every Day 7)  Red Yeast Rice 600 Mg Tabs (Red Yeast Rice Extract) .... Take 1 Tablet By Mouth Once A Day 8)  Saw Palmetto 500 Mg Caps (Saw Palmetto (Serenoa Repens)) .... Take 1 Capsule By Mouth Once A Day 9)  Naproxen Sodium 220 Mg Tabs (Naproxen Sodium) .... Once Daily 10)  Glucosamine-Chondroitin 1500-1200 Mg/3ml Liqd (Glucosamine-Chondroitin) .... Once Daily 11)  Super B-50 B Complex  Caps (B Complex-Biotin-Fa) .Marland Kitchen.. 1 Once Daily 12)  Prostate Therapy Complex  Caps (Misc  Natural Products) .Marland Kitchen.. 1 Once Daily  Allergies (verified): No Known Drug Allergies  Past History:  Past Medical History: Last updated: 01/10/2010 Hyperlipidemia Hypertension hearing loss Sleep Apnea 2007 Hx syncope- St. Jude Confirm loop-2010  Past Surgical History: Last updated: 01/10/2010  St. Jude loop implantation-St. Jude Confirm/2010  Family History: Last updated: 07/17/2008 Family History Hypertension parent Alcoholism, grandparent Stroke, parent  Social History: Last updated: 08/07/2008 Occupation:  Psychologist, sport and exercise Alcohol use-yes--2- beers every day Regular exercise-yes Tobacco Use - Former. --quit 1984  Risk Factors: Exercise: yes (07/17/2008)  Risk Factors: Smoking Status: quit (08/07/2008)  Vital Signs:  Patient profile:   70 year old male Height:      68 inches Weight:      184 pounds BMI:     28.08 Pulse rate:   72 / minute BP sitting:   128 / 70  (left arm)  Vitals Entered By: Caralee Ates CMA (April 28, 2010 10:43 AM)  Physical Exam  General:  The patient was alert and oriented in no acute distress. HEENT Normal.  Neck veins were flat, carotids were brisk.  Lungs were clear.  Heart sounds were regular without murmurs or gallops.  Abdomen was soft with active bowel sounds. There is no clubbing cyanosis or edema. Skin Warm and dry pacemaker pocket is well-healed   PPM Specifications Following MD:  Sherryl Manges, MD     PPM Vendor:  [facility]  PPM Model Number:  [id number]     PPM Serial Number:  [id number] PPM DOI:  01/27/2010     PPM Implanting MD:  Sherryl Manges, MD  Lead 1    Location: RA     DOI: 01/27/2010     Model #: [id number]     Serial #: [id number]     Status: active Lead 2    Location: RV     DOI: 01/27/2010     Model #: [id number]     Serial #: [id number]     Status: active  Magnet Response Rate:  BOL 100 ERI 85  Indications:  Sick sinus syndrome   PPM Follow Up Remote Check?  No Battery Voltage:  2.98 V     Battery Est. Longevity:   10.6 years     Pacer Dependent:  No       PPM Device Measurements Atrium  Amplitude: 4.9 mV, Impedance: 390 ohms, Threshold: 0.75 V at 0.4 msec Right Ventricle  Amplitude: 9.7 mV, Impedance: 490 ohms, Threshold: 1.375 V at 0.4 msec  Episodes MS Episodes:  3     Percent Mode Switch:  <1%     Coumadin:  No Atrial Pacing:  <1%     Ventricular Pacing:  <1%  Parameters Mode:  DDD     Lower Rate Limit:  50     Upper Rate Limit:  110 Paced AV Delay:  250     Sensed AV Delay:  250 Next Cardiology Appt Due:  01/19/2011 Tech Comments:  Auto capture programmed on in both chambers.  Atrial noise noted with 3 episodes and 2 mode switch episodes noted all 1/8 @ 4pm.  I was not able to reproduce the noise.  ROV 10/12 with Dr.Klein. 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 1/2 daily  Problem # 3:  SYNCOPE (ICD-780.2) no recurrent syncope His updated medication list for this problem includes:    Benazepril Hcl 40 Mg Tabs (Benazepril hcl) .Marland Kitchen... 1/2 daily  Problem # 4:  NOISE ON THE ATRIAL LEAD (ICD-996.01) the patient had a bunch of noise on his atrial lead yesterday. This occurred following [place] on an a shower today Patient. The possibilities are that this represents a short circuit in the sauna or the shower which he was exposed. The noise was picked up on leads which strongly suggests that the exposure was external to the device and not an atrial lead issue.  Patient Instructions: 1)  Your physician recommends that you continue  on your current medications as directed. Please refer to the Current Medication list given to you today. 2)  Your physician wants you to follow-up in: October 2012   You will receive a reminder letter in the mail two months in advance. If you don't receive a letter, please call our office to schedule the follow-up appointment.

## 2010-07-03 LAB — SURGICAL PCR SCREEN
MRSA, PCR: NEGATIVE
Staphylococcus aureus: NEGATIVE

## 2010-08-26 ENCOUNTER — Encounter: Payer: Self-pay | Admitting: Family Medicine

## 2010-08-27 ENCOUNTER — Ambulatory Visit (INDEPENDENT_AMBULATORY_CARE_PROVIDER_SITE_OTHER): Payer: 59 | Admitting: Family Medicine

## 2010-08-27 ENCOUNTER — Encounter: Payer: Self-pay | Admitting: Family Medicine

## 2010-08-27 VITALS — BP 140/68 | Temp 98.5°F | Ht 68.0 in | Wt 180.0 lb

## 2010-08-27 DIAGNOSIS — J45909 Unspecified asthma, uncomplicated: Secondary | ICD-10-CM

## 2010-08-27 MED ORDER — METHYLPREDNISOLONE ACETATE 80 MG/ML IJ SUSP
80.0000 mg | Freq: Once | INTRAMUSCULAR | Status: AC
  Administered 2010-08-27: 80 mg via INTRAMUSCULAR

## 2010-08-27 MED ORDER — DOXYCYCLINE HYCLATE 100 MG PO CAPS: 100.0000 mg | ORAL_CAPSULE | Freq: Two times a day (BID) | ORAL | Status: AC

## 2010-08-27 NOTE — Progress Notes (Signed)
  Subjective:    Patient ID: [name] [name], male    DOB: 31-May-1940, 70 y.o.   MRN: [id number]  HPI Patient seen with onset about 3 weeks ago of cough and some nasal congestion. Cough productive of yellow sputum on occasion. No fever. Some wheezing especially at night. Patient is nonsmoker. Question of some seasonal allergies. He denies any headaches blurry localizing facial pain. No periodic nasal secretions.  History of recurrent syncope. Had a loop monitor her and apparently this revealed some asystole. Patient now has pacemaker with no recurrent syncopal episodes. Hypertension and hyperlipidemia managed with Lotensin and Crestor. Labs followed through the [place] [facility]   Review of Systems  Constitutional: Negative for fever, chills and unexpected weight change.  HENT: Positive for congestion. Negative for sore throat.   Respiratory: Positive for cough and wheezing. Negative for shortness of breath.   Cardiovascular: Negative for chest pain, palpitations and leg swelling.       Objective:   Physical Exam  Constitutional: He appears well-developed and well-nourished.  HENT:  Right Ear: External ear normal.  Left Ear: External ear normal.  Mouth/Throat: Oropharynx is clear and moist. No oropharyngeal exudate.  Neck: Neck supple.  Cardiovascular: Normal rate, regular rhythm and normal heart sounds.   Pulmonary/Chest: Effort normal. No respiratory distress. He has wheezes. He has no rales.  Musculoskeletal: He exhibits no edema.  Lymphadenopathy:    He has no cervical adenopathy.          Assessment & Plan:  Asthmatic bronchitis. Depo-Medrol 80 mg given. Prescription for doxycycline 100 mg twice a day for 10 days start promptly for any fever or worsening productive cough.

## 2010-09-02 NOTE — Op Note (Signed)
NAMEDEMARKO, ZEIMET NO.:  [id number]   MEDICAL RECORD NO.:  [id number]          PATIENT TYPE:  OIB   LOCATION:  2899                         FACILITY:  [facility]   PHYSICIAN:  [name], MD, FACCDATE OF BIRTH:  [birth year]   DATE OF PROCEDURE:  08/17/2008  DATE OF DISCHARGE:  08/17/2008                               OPERATIVE REPORT   SURGEON:  [name], MD, [facility]   Following the negative tilt table testing, the patient was submitted for  loop recorder implantation.   After routine prep and drape of the left upper chest, lidocaine was  infiltrated along the line about 2 cm caudal to the clavicle and 1 cm  lateral to the sternum.  Incision was made and carried down to the layer  of the prepectoral fascia.  Using sharp dissection with electrocautery,  a pocket was formed.  Two 2-0 silk sutures were placed at the cephalad  aspect of the pocket and were used to secure a St. Jude [place], model  [id number], serial number [id number].  The pocket was copiously irrigated with  antibiotic-containing saline solution.  The device was secured.  The  wound was closed in 3 layers in normal fashion.  The wound was washed,  dried, and a benzoin and Steri-Strips dressing was applied.  Needle  counts, sponge counts, and instrument counts were correct at the end of  the procedure according to the staff.  The patient tolerated the  procedure without apparent complication.      [name], MD, [facility]  Electronically Signed     SCK/MEDQ  D:  08/17/2008  T:  08/18/2008  Job:  [id number]

## 2010-09-02 NOTE — Assessment & Plan Note (Signed)
[facility]                         ELECTROPHYSIOLOGY OFFICE NOTE   NAME:[name], [name] BEKELE                       MRN:          [id number]  DATE:12/18/2008                            DOB:          1940/05/23    [name] is seen in followup for recurrent syncope.  He is status post  loop recorder.  There have been no intercurrent episodes.   PHYSICAL EXAMINATION:  VITAL SIGNS:  Blood pressure 112/66; his pulse  was 68; his respirations were 18, unlabored.  LUNGS:  Clear.  HEART:  Heart sounds were regular.  EXTREMITIES:  Without edema.   Interrogation of his St. Jude loop recorder demonstrated artifactual  asystole as well as tachycardia.   We will plan to have him come in for recurrent syncope and otherwise see  him again in 1 year's time.     [name], MD, [facility]  Electronically Signed    SCK/MedQ  DD: 12/18/2008  DT: 12/18/2008  Job #: [id number]

## 2010-09-02 NOTE — Op Note (Signed)
[name] [name], [name] [name]                ACCOUNT NO.:  [id number]   MEDICAL RECORD NO.:  [id number]          PATIENT TYPE:  OIB   LOCATION:  2899                         FACILITY:  [facility]   PHYSICIAN:  [name], MD, FACCDATE OF BIRTH:  Sep 23, 1940   DATE OF PROCEDURE:  DATE OF DISCHARGE:  08/17/2008                               OPERATIVE REPORT   PREOPERATIVE DIAGNOSIS:  Syncope.   POSTOPERATIVE DIAGNOSIS:  Syncope.   PROCEDURE:  Tilt table testing.   The patient was equilibrated in the supine position and then tilted up  to 70 degrees.  Blood pressures remained in the 115-130 range and the  pulses in the 65-75 range which was comparable to his equilibrated  values.   He then received nitroglycerin 400 mcg sublingual.  There was a modest  change in heart rate from the mid 70s to the low 90s.  Blood pressure  remained stable.  The patient was returned to the supine position.   IMPRESSION:  Negative tilt table test.   RECOMMENDATION:  Proceed with loop recorder implantation.      [name], MD, [facility]  Electronically Signed     SCK/MEDQ  D:  08/17/2008  T:  08/18/2008  Job:  [id number]

## 2010-09-05 NOTE — Procedures (Signed)
NAME:  [name] [name], [name] [name]                ACCOUNT NO.:  [id number]   MEDICAL RECORD NO.:  [id number]          PATIENT TYPE:  OUT   LOCATION:  SLEEP CENTER                 [facility]ACILITY[facility]  [facility]   PHYSICIAN:  Clinton D. Maple Hudson, M.D. DATE OF BIRTH:  1941/02/03   DATE OF STUDY:  02/17/2005                              NOCTURNAL POLYSOMNOGRAM   REFERRING PHYSICIAN:  Dr. Dara Hoyer.   DATE OF STUDY:  February 17, 2005.   INDICATION FOR STUDY:  Hypersomnia with sleep apnea.   EPWORTH SLEEPINESS SCORE:  7/24.   BMI:  27.6.   WEIGHT:  187 pounds.   Home medications include Avapro, HCTZ and one other.   SLEEP ARCHITECTURE:  Total sleep time 306 minutes with sleep efficiency 79%.  Stage I 9%, stage II 79%, stages III and IV 1%, REM 11% of total sleep time.  Sleep latency 6 minutes, REM latency 168 minutes, awake after sleep onset 75  minutes, arousal index increased at 79. No bedtime medication taken.   RESPIRATORY DATA:  Apnea/hypopnea index (AHI, RDI) 34.1 obstructive events  per hour. There were 1 central apnea, 52 obstructive apneas, 7 mixed apneas  and 114 hypopneas. All sleep and therefore all events were on right side.  REM AHI 12.2 per hour. There were insufficient early events to permit use of  C-PAP titration by split protocol on the study night.   OXYGEN DATA:  Moderate snoring with oxygen desaturation to a nadir of 88%.  Mean oxygen saturation through the study was 96% on room air.   CARDIAC DATA:  Normal sinus rhythm.   MOVEMENT/PARASOMNIA:  A total of 289 limb jerks were reported of which 181  were associated with arousal or awakening for a significantly abnormal  periodic limb movement with arousal index of 35.5 per hour.   IMPRESSION/RECOMMENDATIONS:  1.  Moderate obstructive sleep apnea/hypopnea syndrome, AHI 34.1 per hour      with moderate snoring and oxygen desaturation to 88%.  2.  Consider return for C-PAP titration or evaluate for alternative      therapies as  appropriate.  3.  Periodic limb movement with arousal syndrome, 35.5 per hour. Consider      specific therapy such as clonazepam or Requip for therapeutic trial if      clinically appropriate.      Clinton D. Maple Hudson, M.D.  Diplomate, Biomedical engineer of Sleep Medicine  Electronically Signed     CDY/MEDQ  D:  02/22/2005 15:23:03  T:  02/23/2005 01:04:10  Job:  [id number]

## 2010-09-05 NOTE — Op Note (Signed)
[place]  Patient:    [name] [name], [name] [name]                         MRN: [id number] Proc. Date: 07/29/00 Attending:  Verlin Grills, M.D. CC:         Teena Irani. Arlyce Dice, M.D., [facility]   Operative Report  PROCEDURE:  Colonoscopy and polypectomy.  REFERRING PHYSICIAN:  Teena Irani. Arlyce Dice, M.D., [facility].  PROCEDURE INDICATION:  [name] (date of birth 1941/02/03) is a 70 year old male who is due for a surveillance colonoscopy with polypectomy to prevent colon cancer.  I discussed with [name] the complications associated with colonoscopy and polypectomy, including intestinal bleeding and intestinal perforation.  [name] has signed the operative permit.  ENDOSCOPIST:  Verlin Grills, M.D.  PREMEDICATION:  Versed 10 mg, Demerol 50 mg.  ENDOSCOPE:  Olympus pediatric colonoscope.  DESCRIPTION OF PROCEDURE:  After obtaining informed consent, the patient was placed in the left lateral decubitus position.  I administered intravenous Demerol and intravenous Versed to achieve conscious sedation for the procedure.  The patients blood pressure, oxygen saturation, and cardiac rhythm were monitored throughout the procedure and documented in the medical record.  Anal inspection was normal.  Digital rectal inspection was normal.  The Olympus pediatric video colonoscope was introduced into the rectum and under direct vision advanced to the cecum, as identified by a normal-appearing ileocecal valve.  Colonic preparation for the exam today was excellent.  Rectum:  From the midrectum, a 2 mm sessile polyp was removed with the electrocautery snare and submitted for pathologic interpretation.  Sigmoid colon and descending colon normal.  Splenic flexure normal.  Transverse colon normal.  Hepatic flexure normal.  Ascending colon:  From the proximal ascending colon, two 1 mm  sessile polyps were removed with the cold biopsy forceps and submitted for pathologic interpretation.  Cecum and ileocecal valve normal.  ASSESSMENT: 1. From the proximal ascending colon, two 1 mm sessile polyps removed with the    cold biopsy forceps and submitted for pathologic interpretation. 2. From the midrectum, a 2 mm sessile polyp removed with the electrocautery    snare and submitted for pathologic interpretation.  RECOMMENDATIONS:  If any of the colonic polyps return neoplastic, [name] should undergo a repeat surveillance colonoscopy in approximately five years. If the polyps removed today are non-neoplastic pathologically, [name] should undergo a repeat surveillance colonoscopy in approximately 10 years. DD:  07/29/00 TD:  07/29/00 Job: [id number]

## 2010-09-05 NOTE — Procedures (Signed)
NAME:  [name] [name], [name] [name]                ACCOUNT NO.:  [id number]   MEDICAL RECORD NO.:  [id number]          PATIENT TYPE:  OUT   LOCATION:  SLEEP CENTER                 [facility]ACILITY[facility]   PHYSICIAN:  Clinton D. Maple Hudson, M.D. DATE OF BIRTH:  1940/10/22   DATE OF STUDY:  08/18/2005                              NOCTURNAL POLYSOMNOGRAM   REFERRING PHYSICIAN:  Dr. Dara Hoyer.   INDICATIONS FOR STUDY:  Hypersomnia with sleep apnea.   EPWORTH SLEEPINESS SCORE:  05/24.   BMI:  27.6.  Weight 187 pounds.   HOME MEDICATION:  Avapro, Crestor, HCTZ.   A baseline diagnostic NPSG on 02/17/2005 gave a AHI of 34.1 per hour.  CPAP  titration is requested.   SLEEP ARCHITECTURE:  Total sleep time 378 minutes with sleep efficiency 82%.  Stage I was 6%, stage II 76%, stages III and IV absent, REM 18% of total  sleep time.  Sleep latency 43 minutes, REM latency 269 minutes, awake after  sleep onset 41 minutes, arousal index 19.7.  No bedtime medication was  reported.   RESPIRATORY DATA:  CPAP titration protocol.  CPAP was titrated to 8 CWP, AHI  0.3 per hour.  A medium ComfortGel mask was used with chin strap and heated  humidifier.   OXYGEN DATA:  Snoring was prevented by final CPAP with oxygen saturation  held 96% on CPAP with room air.   CARDIAC DATA:  Sinus rhythm.  Heart rate 50-62 per minute.   MOVEMENT/PARASOMNIAS:  Frequent limb jerks with a total of 617 recorded of  which 29 were associated with arousal or awakening for periodic limb  movement with arousal index of 4.6 per hour which is mildly increased.   IMPRESSION/RECOMMENDATIONS:  1.  Successful CPAP titration to 8 CWP,  AHI 0.3 per hour.  A medium      ComfortGel mask was used with a chin strap and heated humidifier.  2.  Baseline diagnostic NPSG on 02/17/2005 recorded an AHI of 34.1 per hour.  3.  Periodic limb movement with arousal, 4.6 per hour.  This can be      reconsidered clinically after he has a chance to adjust to home  CPAP,      with consideration of specific therapy such as Requip or Mirapex if      appropriate.      Clinton D. Maple Hudson, M.D.  Diplomate, Biomedical engineer of Sleep Medicine  Electronically Signed     CDY/MEDQ  D:  08/23/2005 11:19:17  T:  08/24/2005 12:41:26  Job:  [id number]

## 2010-10-08 ENCOUNTER — Other Ambulatory Visit: Payer: Self-pay | Admitting: Dermatology

## 2011-01-27 ENCOUNTER — Encounter: Payer: Self-pay | Admitting: Internal Medicine

## 2011-01-27 ENCOUNTER — Ambulatory Visit (INDEPENDENT_AMBULATORY_CARE_PROVIDER_SITE_OTHER): Payer: Medicare Other | Admitting: Internal Medicine

## 2011-01-27 DIAGNOSIS — D509 Iron deficiency anemia, unspecified: Secondary | ICD-10-CM

## 2011-01-27 DIAGNOSIS — I495 Sick sinus syndrome: Secondary | ICD-10-CM

## 2011-01-27 DIAGNOSIS — N183 Chronic kidney disease, stage 3 unspecified: Secondary | ICD-10-CM

## 2011-01-27 DIAGNOSIS — Z950 Presence of cardiac pacemaker: Secondary | ICD-10-CM

## 2011-01-27 DIAGNOSIS — R55 Syncope and collapse: Secondary | ICD-10-CM

## 2011-01-27 HISTORY — DX: Sick sinus syndrome: I49.5

## 2011-01-27 LAB — PACEMAKER DEVICE OBSERVATION
AL AMPLITUDE: 3.9 mv
AL IMPEDENCE PM: 400 Ohm
AL THRESHOLD: 0.75 V
ATRIAL PACING PM: 1.6
BAMS-0001: 150 {beats}/min
BAMS-0003: 70 {beats}/min
BATTERY VOLTAGE: 2.9629 V
DEVICE MODEL PM: 7156802
RV LEAD AMPLITUDE: 7.6 mv
RV LEAD IMPEDENCE PM: 462.5 Ohm
RV LEAD THRESHOLD: 0.75 V
VENTRICULAR PACING PM: 1

## 2011-01-27 NOTE — Patient Instructions (Signed)
Your physician wants you to follow-up in: 1 Year You will receive a reminder letter in the mail two months in advance. If you don't receive a letter, please call our office to schedule the follow-up appointment.   Remote monitoring is used to monitor your Pacemaker of ICD from home. This monitoring reduces the number of office visits required to check your device to one time per year. It allows us to keep an eye on the functioning of your device to ensure it is working properly. You are scheduled for a device check from home on 04/30/2011. You may send your transmission at any time that day. If you have a wireless device, the transmission will be sent automatically. After your physician reviews your transmission, you will receive a postcard with your next transmission date.   

## 2011-01-27 NOTE — Progress Notes (Signed)
  HPI  [name] [name] is a 70 y.o. male  seen because of recurrent syncope. He Had had multiple episodes of syncope over the preceding years and had undergone loop recorder implant which demonstrated sinus node dysfunction . October 2010 he underwent  pacemaker implantation   The patient denies SOB, chest pain, edema or palpitations  He has had no lightheaded spells. There've been no device issues  Past Medical History  Diagnosis Date  . HYPERLIPIDEMIA 07/17/2008  . HYPERTENSION 07/17/2008  . SYNCOPE 07/24/2008  . CHEST WALL PAIN, ACUTE 10/11/2009  . 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 Pacemaker placement     St Jude loop implantation 2010    Current Outpatient Prescriptions  Medication Sig Dispense Refill  . B Complex-C (SUPER B COMPLEX PO) Take 1 tablet by mouth daily.        . benazepril (LOTENSIN) 20 MG tablet Take 20 mg by mouth daily.        . Coenzyme Q10-Vitamin E 100-300 MG-UNIT WAFR Take by mouth.        . Glucosamine-Chondroit-Vit C-Mn (GLUCOSAMINE 1500 COMPLEX) CAPS Take 1 capsule by mouth daily.        . Misc Natural Products (PROSTATE HEALTH) CAPS Take 1 capsule by mouth.        . Multiple Vitamin (MULTIVITAMIN PO) Take 1 tablet by mouth daily.        . naproxen sodium (ANAPROX) 220 MG tablet Take 220 mg by mouth daily.        . Omega-3 Fatty Acids (FISH OIL) 1000 MG CAPS Take 1 capsule by mouth daily.        . rosuvastatin (CRESTOR) 5 MG tablet Take 5 mg by mouth every other day.       . vitamin C (ASCORBIC ACID) 500 MG tablet Take 500 mg by mouth daily.          No Known Allergies  Review of Systems negative except from HPI and PMH  Physical Exam Well developed and well nourished in no acute distress HENT normal E scleral and icterus clear Neck Supple JVP flat; carotids brisk and full Clear to ausculation Regular rate and rhythm, no murmurs gallops or rub Soft with active bowel sounds No clubbing cyanosis and  edema Alert and oriented, grossly normal motor and sensory function Skin Warm and Dry   Assessment and  Plan

## 2011-01-27 NOTE — Assessment & Plan Note (Signed)
2% pacing

## 2011-01-27 NOTE — Assessment & Plan Note (Signed)
The patient's device was interrogated.  The information was reviewed. No changes were made in the programming.    

## 2011-01-27 NOTE — Assessment & Plan Note (Signed)
No recurrnet syncope  

## 2011-04-30 ENCOUNTER — Encounter: Payer: Medicare Other | Admitting: *Deleted

## 2011-05-04 ENCOUNTER — Encounter: Payer: Self-pay | Admitting: *Deleted

## 2011-06-30 ENCOUNTER — Ambulatory Visit (INDEPENDENT_AMBULATORY_CARE_PROVIDER_SITE_OTHER): Payer: Medicare Other | Admitting: Family Medicine

## 2011-06-30 DIAGNOSIS — I10 Essential (primary) hypertension: Secondary | ICD-10-CM

## 2011-06-30 DIAGNOSIS — N320 Bladder-neck obstruction: Secondary | ICD-10-CM | POA: Diagnosis not present

## 2011-06-30 DIAGNOSIS — E785 Hyperlipidemia, unspecified: Secondary | ICD-10-CM

## 2011-06-30 DIAGNOSIS — Z0000 Encounter for general adult medical examination without abnormal findings: Secondary | ICD-10-CM

## 2011-06-30 LAB — HEPATIC FUNCTION PANEL
ALT: 22 U/L (ref 0–53)
AST: 26 U/L (ref 0–37)
Albumin: 4.4 g/dL (ref 3.5–5.2)
Alkaline Phosphatase: 44 U/L (ref 39–117)
Bilirubin, Direct: 0.1 mg/dL (ref 0.0–0.3)
Total Bilirubin: 0.7 mg/dL (ref 0.3–1.2)
Total Protein: 6.8 g/dL (ref 6.0–8.3)

## 2011-06-30 LAB — LIPID PANEL
Cholesterol: 238 mg/dL — ABNORMAL HIGH (ref 0–200)
HDL: 72.1 mg/dL (ref >39.00)
Total CHOL/HDL Ratio: 3
Triglycerides: 51 mg/dL (ref 0.0–149.0)
VLDL: 10.2 mg/dL (ref 0.0–40.0)

## 2011-06-30 LAB — BASIC METABOLIC PANEL
BUN: 15 mg/dL (ref 6–23)
CO2: 28 mEq/L (ref 19–32)
Calcium: 9 mg/dL (ref 8.4–10.5)
Chloride: 94 mEq/L — ABNORMAL LOW (ref 96–112)
Creatinine, Ser: 0.8 mg/dL (ref 0.4–1.5)
GFR: 100.63 mL/min (ref >60.00)
Glucose, Bld: 93 mg/dL (ref 70–99)
Potassium: 4.8 mEq/L (ref 3.5–5.1)
Sodium: 132 mEq/L — ABNORMAL LOW (ref 135–145)

## 2011-06-30 LAB — PSA: PSA: 0.8 ng/mL (ref 0.10–4.00)

## 2011-06-30 LAB — LDL CHOLESTEROL, DIRECT: Direct LDL: 151.9 mg/dL

## 2011-06-30 NOTE — Progress Notes (Signed)
Subjective:    Patient ID: [name] [name], male    DOB: February 16, 1941, 70 y.o.   MRN: [id number]  HPI  Patient for medicare wellness exam and medical followup. Went to the [facility] last October. Had lipids with labs at that tjme. LDL slightly elevated 128. Past medical history is positive for hypertension, hyperlipidemia, sinus node dysfunction with previous syncope stabilized with cardiac pacemaker. Good exercise tolerance at this time. Medications reviewed and compliant.  1.  Risk factors based on Past Medical , Social, and Family history reviewed as indicated elsewhere 2.  Limitations in physical activities exercises regularly. Low risk for fall 3.  Depression/mood no depression issues 4.  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 Followed by cardiologist 5.  ADLs fully independent in all 6.  Cognitive function (orientation to time and place, language, writing, speech,memory) no memory deficits. Judgment intact. Language intact 7.  Home Safety  No issues identified 8.  Height, weight, and visual acuity. All stable 9.  Counseling exercise and diet discussed. Needs repeat colonoscopy. 10. Recommendation of preventive services. Colonoscopy. Abdominal aneurysm screen with risk factors including age, family history of abdominal aneurysm and father, and ex-smoker 4. Labs based on risk factors PSA, lipid panel, hepatic panel, basic metabolic panel 12. Care Plan as above  Past Medical History  Diagnosis Date  . HYPERLIPIDEMIA 07/17/2008  . HYPERTENSION 07/17/2008  . SYNCOPE 07/24/2008  . CHEST WALL PAIN, ACUTE 10/11/2009  . 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 Pacemaker placement     St Jude loop implantation 2010    reports that he quit smoking about 27 years ago. His smoking use included Cigarettes. He has a 20 pack-year smoking history. He does not have any smokeless tobacco history on file. His alcohol and drug histories not on  file. family history includes Alcohol abuse in his other; Hypertension in his other; and Stroke in his other. No Known Allergies     Review of Systems  Constitutional: Negative for fever, activity change, appetite change and fatigue.  HENT: Negative for ear pain, congestion and trouble swallowing.   Eyes: Negative for pain and visual disturbance.  Respiratory: Negative for cough, shortness of breath and wheezing.   Cardiovascular: Negative for chest pain and palpitations.  Gastrointestinal: Negative for nausea, vomiting, abdominal pain, diarrhea, constipation, blood in stool, abdominal distention and rectal pain.  Genitourinary: Negative for dysuria, hematuria and testicular pain.  Musculoskeletal: Negative for joint swelling and arthralgias.  Skin: Negative for rash.  Neurological: Negative for dizziness, syncope and headaches.  Hematological: Negative for adenopathy.  Psychiatric/Behavioral: Negative for confusion and dysphoric mood.       Objective:   Physical Exam  Constitutional: He is oriented to person, place, and time. He appears well-developed and well-nourished. No distress.  HENT:  Head: Normocephalic and atraumatic.  Right Ear: External ear normal.  Left Ear: External ear normal.  Mouth/Throat: Oropharynx is clear and moist.  Eyes: Conjunctivae and EOM are normal. Pupils are equal, round, and reactive to light.  Neck: Normal range of motion. Neck supple. No thyromegaly present.  Cardiovascular: Normal rate, regular rhythm and normal heart sounds.   No murmur heard. Pulmonary/Chest: No respiratory distress. He has no wheezes. He has no rales.  Abdominal: Soft. Bowel sounds are normal. He exhibits no distension and no mass. There is no tenderness. There is no rebound and no guarding.  Genitourinary: Rectum normal and prostate normal.  Musculoskeletal: He exhibits no edema.  Lymphadenopathy:  He has no cervical adenopathy.  Neurological: He is alert and oriented to  person, place, and time. He displays normal reflexes. No cranial nerve deficit.  Skin: No rash noted.  Psychiatric: He has a normal mood and affect. His behavior is normal. Judgment and thought content normal.          Assessment & Plan:  #1Complete physical. Labs ordered. Schedule repeat colonoscopy. Patient requests [facility]. Schedule abdominal aneurysm screen (age, ex-smoker, htn hx, positive FH AAA)  #2Hypertension stable continue Lotensin #3 hyperlipidemia continue Crestor.  Check lipids and hepatic.

## 2011-07-01 ENCOUNTER — Encounter: Payer: Self-pay | Admitting: Family Medicine

## 2011-07-01 NOTE — Progress Notes (Signed)
Quick Note:  Pt informed on home personally identified VM ______ 

## 2011-07-02 ENCOUNTER — Encounter: Payer: Self-pay | Admitting: Internal Medicine

## 2011-07-10 ENCOUNTER — Encounter (INDEPENDENT_AMBULATORY_CARE_PROVIDER_SITE_OTHER): Payer: Medicare Other

## 2011-07-10 DIAGNOSIS — Z0000 Encounter for general adult medical examination without abnormal findings: Secondary | ICD-10-CM | POA: Diagnosis not present

## 2011-07-16 NOTE — Progress Notes (Signed)
Quick Note:  Pt wife Peggy informed ______

## 2011-08-04 ENCOUNTER — Telehealth: Payer: Self-pay | Admitting: Internal Medicine

## 2011-08-04 ENCOUNTER — Ambulatory Visit (AMBULATORY_SURGERY_CENTER): Payer: Medicare Other

## 2011-08-04 VITALS — Ht 68.0 in | Wt 184.2 lb

## 2011-08-04 DIAGNOSIS — Z1211 Encounter for screening for malignant neoplasm of colon: Secondary | ICD-10-CM

## 2011-08-04 MED ORDER — PEG-KCL-NACL-NASULF-NA ASC-C 100 G PO SOLR: 1.0000 | Freq: Once | ORAL | Status: AC

## 2011-08-04 NOTE — Telephone Encounter (Signed)
Received copies from  [facility] 08/04/11. Forwarded 3  pages to Dr. Rhea Belton ,for review.

## 2011-08-04 NOTE — Progress Notes (Signed)
Pt came into the office today for his pre-visit prior to his colonoscopy with Dr Rhea Belton on 08/14/11. Pt states he had a colonoscopy at [facility] in 2002 and will bring a copy of the report to his appointment on 08/14/11. Ulis Rias RN

## 2011-08-11 ENCOUNTER — Encounter: Payer: Self-pay | Admitting: Internal Medicine

## 2011-08-11 DIAGNOSIS — Z86010 Personal history of colonic polyps: Secondary | ICD-10-CM | POA: Insufficient documentation

## 2011-08-14 ENCOUNTER — Encounter: Payer: Self-pay | Admitting: Internal Medicine

## 2011-08-14 ENCOUNTER — Ambulatory Visit (AMBULATORY_SURGERY_CENTER): Payer: Medicare Other | Admitting: Internal Medicine

## 2011-08-14 VITALS — BP 145/88 | HR 68 | Temp 97.3°F | Resp 14 | Ht 68.0 in | Wt 184.0 lb

## 2011-08-14 DIAGNOSIS — D126 Benign neoplasm of colon, unspecified: Secondary | ICD-10-CM

## 2011-08-14 DIAGNOSIS — Z1211 Encounter for screening for malignant neoplasm of colon: Secondary | ICD-10-CM

## 2011-08-14 MED ORDER — SODIUM CHLORIDE 0.9 % IV SOLN: 500.0000 mL | INTRAVENOUS | Status: DC

## 2011-08-14 NOTE — Patient Instructions (Signed)
Discharge instructions given with verbal understanding. Handouts on polyps and diverticulosis given. Resume previous medications. YOU HAD AN ENDOSCOPIC PROCEDURE TODAY AT THE [facility]: Refer to the procedure report that was given to you for any specific questions about what was found during the examination.  If the procedure report does not answer your questions, please call your gastroenterologist to clarify.  If you requested that your care partner not be given the details of your procedure findings, then the procedure report has been included in a sealed envelope for you to review at your convenience later.  YOU SHOULD EXPECT: Some feelings of bloating in the abdomen. Passage of more gas than usual.  Walking can help get rid of the air that was put into your GI tract during the procedure and reduce the bloating. If you had a lower endoscopy (such as a colonoscopy or flexible sigmoidoscopy) you may notice spotting of blood in your stool or on the toilet paper. If you underwent a bowel prep for your procedure, then you may not have a normal bowel movement for a few days.  DIET: Your first meal following the procedure should be a light meal and then it is ok to progress to your normal diet.  A half-sandwich or bowl of soup is an example of a good first meal.  Heavy or fried foods are harder to digest and may make you feel nauseous or bloated.  Likewise meals heavy in dairy and vegetables can cause extra gas to form and this can also increase the bloating.  Drink plenty of fluids but you should avoid alcoholic beverages for 24 hours.  ACTIVITY: Your care partner should take you home directly after the procedure.  You should plan to take it easy, moving slowly for the rest of the day.  You can resume normal activity the day after the procedure however you should NOT DRIVE or use heavy machinery for 24 hours (because of the sedation medicines used during the test).    SYMPTOMS TO REPORT  IMMEDIATELY: A gastroenterologist can be reached at any hour.  During normal business hours, 8:30 AM to 5:00 PM Monday through Friday, call [phone].  After hours and on weekends, please call the GI answering service at [phone] who will take a message and have the physician on call contact you.   Following lower endoscopy (colonoscopy or flexible sigmoidoscopy):  Excessive amounts of blood in the stool  Significant tenderness or worsening of abdominal pains  Swelling of the abdomen that is new, acute  Fever of 100F or higher  Following upper endoscopy (EGD)  Vomiting of blood or coffee ground material  New chest pain or pain under the shoulder blades  Painful or persistently difficult swallowing  New shortness of breath  Fever of 100F or higher  Black, tarry-looking stools  FOLLOW UP: If any biopsies were taken you will be contacted by phone or by letter within the next 1-3 weeks.  Call your gastroenterologist if you have not heard about the biopsies in 3 weeks.  Our staff will call the home number listed on your records the next business day following your procedure to check on you and address any questions or concerns that you may have at that time regarding the information given to you following your procedure. This is a courtesy call and so if there is no answer at the home number and we have not heard from you through the emergency physician on call, we will assume that you   have returned to your regular daily activities without incident.  SIGNATURES/CONFIDENTIALITY: You and/or your care partner have signed paperwork which will be entered into your electronic medical record.  These signatures attest to the fact that that the information above on your After Visit Summary has been reviewed and is understood.  Full responsibility of the confidentiality of this discharge information lies with you and/or your care-partner.  

## 2011-08-14 NOTE — Op Note (Addendum)
[facility] [address] [place], [place]  [address]  COLONOSCOPY PROCEDURE REPORT  PATIENT:  [name] [name], [name] [name]  MR#:  [id number] BIRTHDATE:  29-Dec-1940, 70 yrs. old  GENDER:  male ENDOSCOPIST:  Carie Caddy. Gwynn Chalker, MD REF. BY:  Evelena Peat, M.D. PROCEDURE DATE:  08/14/2011 PROCEDURE:  Colonoscopy with snare polypectomy ASA CLASS:  Class II INDICATIONS:  Routine Risk Screening, 2nd colonoscopy (last 10 years ago) MEDICATIONS:   These medications were titrated to patient response per physician's verbal order, Versed 10 mg IV, Fentanyl 100 mcg IV  DESCRIPTION OF PROCEDURE:   After the risks benefits and alternatives of the procedure were thoroughly explained, informed consent was obtained.  Digital rectal exam was performed and revealed no rectal masses.   The LB CF-[id number] endoscope was introduced through the anus and advanced to the terminal ileum which was intubated for a short distance, without limitations. The quality of the prep was good, using MoviPrep.  The instrument was then slowly withdrawn as the colon was fully examined. <<PROCEDUREIMAGES>> FINDINGS:  Two sessile polyps 3 -5 mm were found in the transverse colon. Polyps were snared without cautery. Retrieval was successful. Four sessile polyps 2 - 5 mm were found in the rectum and sigmoid colon. Polyps were snared, 1 in the sigmoid with cautery and the other 3 without cautery. Retrieval was successful. Mild diverticulosis was found in the left colon.   Retroflexed views in the rectum revealed no abnormalities.   The scope was then withdrawn from the cecum and the procedure completed.  COMPLICATIONS:  None ENDOSCOPIC IMPRESSION: 1) Two polyps in the transverse colon. Removed and sent to pathology. 2) Four polyps in the rectum and sigmoid colon. Removed and sent to pathology. 3) Mild diverticulosis in the left colon  RECOMMENDATIONS: 1) Await pathology results 2) High fiber diet. 3) If the polyps  removed today are proven to be adenomatous (pre-cancerous) polyps, you will need a colonoscopy in 3 years. Otherwise you should continue to follow colorectal cancer screening guidelines for "routine risk" patients with a colonoscopy in 10 years. You will receive a letter within 1-2 weeks with the results of your biopsy as well as final recommendations. Please call my office if you have not received a letter after 3 weeks.  Carie Caddy. Rhea Belton, MD  CC:  Evelena Peat, MD The Patient  n. REVISED:  08/14/2011 09:27 AM eSIGNED:   Carie Caddy. Lafern Brinkley at 08/14/2011 09:27 AM  Janalyn Rouse, [phone]

## 2011-08-14 NOTE — Progress Notes (Signed)
Patient did not experience any of the following events: a burn prior to discharge; a fall within the facility; wrong site/side/patient/procedure/implant event; or a hospital transfer or hospital admission upon discharge from the facility. (G8907) Patient did not have preoperative order for IV antibiotic SSI prophylaxis. (G8918)  

## 2011-08-14 NOTE — Progress Notes (Signed)
Pushed iv meds for colonoscopy , site infiltrated. Iv dc'd intact and pressure dressing applied to hand. New iv started in right wrist without difficulty. Sedation restarted

## 2011-08-17 ENCOUNTER — Telehealth: Payer: Self-pay

## 2011-08-17 NOTE — Telephone Encounter (Signed)
Left message on answering machine. 

## 2011-08-19 ENCOUNTER — Encounter: Payer: Self-pay | Admitting: Internal Medicine

## 2011-09-14 ENCOUNTER — Encounter: Payer: Self-pay | Admitting: *Deleted

## 2011-09-21 ENCOUNTER — Ambulatory Visit (INDEPENDENT_AMBULATORY_CARE_PROVIDER_SITE_OTHER): Payer: Medicare Other | Admitting: *Deleted

## 2011-09-21 ENCOUNTER — Telehealth: Payer: Self-pay | Admitting: Internal Medicine

## 2011-09-21 ENCOUNTER — Encounter: Payer: Self-pay | Admitting: Internal Medicine

## 2011-09-21 DIAGNOSIS — I495 Sick sinus syndrome: Secondary | ICD-10-CM | POA: Diagnosis not present

## 2011-09-21 DIAGNOSIS — Z950 Presence of cardiac pacemaker: Secondary | ICD-10-CM

## 2011-09-21 NOTE — Telephone Encounter (Signed)
Pt got a letter regarding his transmission & I think it was from Jan so I asked him to resend another transmission and we will call him if we don't get this one

## 2011-09-22 LAB — REMOTE PACEMAKER DEVICE
AL AMPLITUDE: 3.6 mv
AL IMPEDENCE PM: 390 Ohm
AL THRESHOLD: 0.75 V
ATRIAL PACING PM: 1
BAMS-0001: 150 {beats}/min
BAMS-0003: 70 {beats}/min
BATTERY VOLTAGE: 2.96 V
DEVICE MODEL PM: 7156802
RV LEAD AMPLITUDE: 7.9 mv
RV LEAD IMPEDENCE PM: 380 Ohm
RV LEAD THRESHOLD: 0.75 V
VENTRICULAR PACING PM: 1

## 2011-09-23 ENCOUNTER — Telehealth: Payer: Self-pay | Admitting: Internal Medicine

## 2011-09-23 NOTE — Telephone Encounter (Signed)
Left message on [phone], transmission came through 09/21/11.  Also home # we have in the system is incorrect, patient aware.

## 2011-09-23 NOTE — Telephone Encounter (Signed)
New msg Pt was calling about letter he received about transmission. Please call him back

## 2011-09-23 NOTE — Telephone Encounter (Signed)
Transmission received, patient aware and he will resend 12/31/11.

## 2011-10-13 ENCOUNTER — Encounter: Payer: Self-pay | Admitting: *Deleted

## 2011-10-14 DIAGNOSIS — L821 Other seborrheic keratosis: Secondary | ICD-10-CM | POA: Diagnosis not present

## 2011-10-14 DIAGNOSIS — L570 Actinic keratosis: Secondary | ICD-10-CM | POA: Diagnosis not present

## 2011-10-14 DIAGNOSIS — Z85828 Personal history of other malignant neoplasm of skin: Secondary | ICD-10-CM | POA: Diagnosis not present

## 2011-12-31 ENCOUNTER — Ambulatory Visit (INDEPENDENT_AMBULATORY_CARE_PROVIDER_SITE_OTHER): Payer: Medicare Other | Admitting: *Deleted

## 2011-12-31 ENCOUNTER — Encounter: Payer: Self-pay | Admitting: Internal Medicine

## 2011-12-31 DIAGNOSIS — Z950 Presence of cardiac pacemaker: Secondary | ICD-10-CM

## 2011-12-31 DIAGNOSIS — I495 Sick sinus syndrome: Secondary | ICD-10-CM

## 2012-01-04 ENCOUNTER — Encounter: Payer: Self-pay | Admitting: *Deleted

## 2012-01-04 LAB — REMOTE PACEMAKER DEVICE
AL AMPLITUDE: 4 mv
AL IMPEDENCE PM: 390 Ohm
AL THRESHOLD: 0.75 V
ATRIAL PACING PM: 2.3
BAMS-0001: 150 {beats}/min
BAMS-0003: 70 {beats}/min
BATTERY VOLTAGE: 2.96 V
DEVICE MODEL PM: 7156802
RV LEAD AMPLITUDE: 9 mv
RV LEAD IMPEDENCE PM: 390 Ohm
RV LEAD THRESHOLD: 0.625 V
VENTRICULAR PACING PM: 1

## 2012-01-27 ENCOUNTER — Encounter: Payer: Self-pay | Admitting: *Deleted

## 2012-02-09 ENCOUNTER — Encounter: Payer: Self-pay | Admitting: Internal Medicine

## 2012-02-09 ENCOUNTER — Ambulatory Visit (INDEPENDENT_AMBULATORY_CARE_PROVIDER_SITE_OTHER): Payer: Medicare Other | Admitting: Internal Medicine

## 2012-02-09 VITALS — BP 120/63 | Ht 68.0 in | Wt 179.8 lb

## 2012-02-09 DIAGNOSIS — R55 Syncope and collapse: Secondary | ICD-10-CM

## 2012-02-09 DIAGNOSIS — Z950 Presence of cardiac pacemaker: Secondary | ICD-10-CM | POA: Diagnosis not present

## 2012-02-09 DIAGNOSIS — I495 Sick sinus syndrome: Secondary | ICD-10-CM | POA: Diagnosis not present

## 2012-02-09 LAB — PACEMAKER DEVICE OBSERVATION
AL AMPLITUDE: 4.8 mv
AL IMPEDENCE PM: 390 Ohm
AL THRESHOLD: 1 V
ATRIAL PACING PM: 2
BAMS-0001: 150 {beats}/min
BAMS-0003: 70 {beats}/min
BATTERY VOLTAGE: 2.96 V
DEVICE MODEL PM: 7156802
RV LEAD AMPLITUDE: 7.6 mv
RV LEAD IMPEDENCE PM: 400 Ohm
RV LEAD THRESHOLD: 0.75 V
VENTRICULAR PACING PM: 1

## 2012-02-09 NOTE — Patient Instructions (Signed)
Remote monitoring is used to monitor your Pacemaker of ICD from home. This monitoring reduces the number of office visits required to check your device to one time per year. It allows [place] to keep an eye on the functioning of your device to ensure it is working properly. You are scheduled for a device check from home on May 16, 2012. You may send your transmission at any time that day. If you have a wireless device, the transmission will be sent automatically. After your physician reviews your transmission, you will receive a postcard with your next transmission date.  Your physician wants you to follow-up in: 1 year with Dr. Graciela Husbands. You will receive a reminder letter in the mail two months in advance. If you don't receive a letter, please call our office to schedule the follow-up appointment.

## 2012-02-09 NOTE — Progress Notes (Signed)
Patient Care Team: [name], MD as PCP - General   HPI  [name] [name] is a 71 y.o. male Seen in followup for recurrent syncope associated with demonstrated sinus node dysfunction by implantable loop recorder. He is status post pacemaker implantation 10/11  No recurrent syncope  Past Medical History  Diagnosis Date  . HYPERLIPIDEMIA 07/17/2008  . HYPERTENSION 07/17/2008  . SYNCOPE 07/24/2008  . CHEST WALL PAIN, ACUTE 10/11/2009  . 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 Sleep apnea     Past Surgical History  Procedure Date  . Pacemaker placement     St Jude loop implantation 2010  . Colonoscopy     Current Outpatient Prescriptions  Medication Sig Dispense Refill  . B Complex-C (SUPER B COMPLEX PO) Take 1 tablet by mouth daily.        . benazepril (LOTENSIN) 20 MG tablet Take 20 mg by mouth daily.        . Coenzyme Q10-Vitamin E 100-300 MG-UNIT WAFR Take by mouth.        . Glucosamine-Chondroit-Vit C-Mn (GLUCOSAMINE 1500 COMPLEX) CAPS Take 1 capsule by mouth daily.        . Misc Natural Products (PROSTATE HEALTH) CAPS Take 1 capsule by mouth.        . Multiple Vitamin (MULTIVITAMIN PO) Take 1 tablet by mouth daily.        . naproxen sodium (ANAPROX) 220 MG tablet Take 220 mg by mouth daily.        . Omega-3 Fatty Acids (FISH OIL) 1000 MG CAPS Take 1 capsule by mouth daily.        . rosuvastatin (CRESTOR) 5 MG tablet Take 5 mg by mouth every other day.       . vitamin C (ASCORBIC ACID) 500 MG tablet Take 500 mg by mouth daily.          No Known Allergies  Review of Systems negative except from HPI and PMH  Physical Exam BP 120/63  Ht 5\' 8"  (1.727 m)  Wt 179 lb 12.8 oz (81.557 kg)  BMI 27.34 kg/m2 Well developed and well nourished in no acute distress HENT normal E scleral and icterus clear Neck Supple JVP flat; carotids brisk and full Clear to ausculation Regular rate and rhythm, no murmurs gallops or rub Soft with active bowel sounds No clubbing cyanosis none  Edema Alert and oriented, grossly normal motor and sensory function Skin Warm and Dry  ECG demonstrates sinus rhythm at 56 intervals 16/10/39 axis of -21 otherwise normal  Assessment and  Plan

## 2012-02-09 NOTE — Assessment & Plan Note (Signed)
No recurrent syncope 

## 2012-02-09 NOTE — Assessment & Plan Note (Signed)
2% atrila pacinig

## 2012-02-09 NOTE — Assessment & Plan Note (Signed)
The patient's device was interrogated.  The information was reviewed. No changes were made in the programming.    

## 2012-02-11 DIAGNOSIS — Z23 Encounter for immunization: Secondary | ICD-10-CM | POA: Diagnosis not present

## 2012-05-03 ENCOUNTER — Ambulatory Visit (INDEPENDENT_AMBULATORY_CARE_PROVIDER_SITE_OTHER): Payer: Medicare Other | Admitting: *Deleted

## 2012-05-03 DIAGNOSIS — I495 Sick sinus syndrome: Secondary | ICD-10-CM | POA: Diagnosis not present

## 2012-05-03 DIAGNOSIS — Z950 Presence of cardiac pacemaker: Secondary | ICD-10-CM | POA: Diagnosis not present

## 2012-05-16 ENCOUNTER — Encounter: Payer: Medicare Other | Admitting: *Deleted

## 2012-05-17 ENCOUNTER — Encounter: Payer: Self-pay | Admitting: *Deleted

## 2012-06-23 ENCOUNTER — Other Ambulatory Visit: Payer: Self-pay

## 2012-06-23 ENCOUNTER — Encounter: Payer: Self-pay | Admitting: Internal Medicine

## 2012-06-23 LAB — REMOTE PACEMAKER DEVICE
AL AMPLITUDE: 3.6 mv
AL IMPEDENCE PM: 380 Ohm
AL THRESHOLD: 0.875 V
ATRIAL PACING PM: 1
BAMS-0001: 150 {beats}/min
BAMS-0003: 70 {beats}/min
BATTERY VOLTAGE: 2.96 V
DEVICE MODEL PM: 7156802
RV LEAD AMPLITUDE: 9.4 mv
RV LEAD IMPEDENCE PM: 360 Ohm
RV LEAD THRESHOLD: 0.75 V
VENTRICULAR PACING PM: 1

## 2012-07-01 ENCOUNTER — Encounter: Payer: Self-pay | Admitting: Family Medicine

## 2012-07-01 ENCOUNTER — Ambulatory Visit (INDEPENDENT_AMBULATORY_CARE_PROVIDER_SITE_OTHER): Payer: Medicare Other | Admitting: Family Medicine

## 2012-07-01 VITALS — BP 120/70 | HR 72 | Temp 98.4°F | Resp 12 | Ht 67.75 in | Wt 185.0 lb

## 2012-07-01 DIAGNOSIS — H0230 Blepharochalasis unspecified eye, unspecified eyelid: Secondary | ICD-10-CM

## 2012-07-01 DIAGNOSIS — I10 Essential (primary) hypertension: Secondary | ICD-10-CM | POA: Diagnosis not present

## 2012-07-01 DIAGNOSIS — Z0000 Encounter for general adult medical examination without abnormal findings: Secondary | ICD-10-CM | POA: Diagnosis not present

## 2012-07-01 DIAGNOSIS — E785 Hyperlipidemia, unspecified: Secondary | ICD-10-CM | POA: Diagnosis not present

## 2012-07-01 LAB — BASIC METABOLIC PANEL
BUN: 18 mg/dL (ref 6–23)
CO2: 30 mEq/L (ref 19–32)
Calcium: 9.1 mg/dL (ref 8.4–10.5)
Chloride: 100 mEq/L (ref 96–112)
Creatinine, Ser: 0.9 mg/dL (ref 0.4–1.5)
GFR: 87.72 mL/min (ref >60.00)
Glucose, Bld: 97 mg/dL (ref 70–99)
Potassium: 4.9 mEq/L (ref 3.5–5.1)
Sodium: 136 mEq/L (ref 135–145)

## 2012-07-01 LAB — LIPID PANEL
Cholesterol: 234 mg/dL — ABNORMAL HIGH (ref 0–200)
HDL: 57.5 mg/dL (ref >39.00)
Total CHOL/HDL Ratio: 4
Triglycerides: 98 mg/dL (ref 0.0–149.0)
VLDL: 19.6 mg/dL (ref 0.0–40.0)

## 2012-07-01 LAB — HEPATIC FUNCTION PANEL
ALT: 24 U/L (ref 0–53)
AST: 26 U/L (ref 0–37)
Albumin: 4.4 g/dL (ref 3.5–5.2)
Alkaline Phosphatase: 49 U/L (ref 39–117)
Bilirubin, Direct: 0.1 mg/dL (ref 0.0–0.3)
Total Bilirubin: 0.9 mg/dL (ref 0.3–1.2)
Total Protein: 7.5 g/dL (ref 6.0–8.3)

## 2012-07-01 LAB — LDL CHOLESTEROL, DIRECT: Direct LDL: 146 mg/dL

## 2012-07-01 NOTE — Progress Notes (Addendum)
Subjective:    Patient ID: [name] [name], male    DOB: 1940-12-07, 72 y.o.   MRN: [id number]  HPI Medicare wellness exam and medical followup Patient has history of hypertension, hyperlipidemia, colon polyps, sinus node dysfunction with pacemaker.  Immunizations up to date. Patient gets medications through [facility] health system. Medications reviewed. Compliant with all. No recent dizziness or syncope. Followed closely by cardiology regarding his pacemaker No other cardiac history. Nonsmoker. He has chronic hearing loss and uses a hearing aid.  Past Medical History  Diagnosis Date  . HYPERLIPIDEMIA 07/17/2008  . HYPERTENSION 07/17/2008  . SYNCOPE 07/24/2008  . CHEST WALL PAIN, ACUTE 10/11/2009  . 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 Sleep apnea    Past Surgical History  Procedure Laterality Date  . Pacemaker placement      St Jude loop implantation 2010  . Colonoscopy      reports that he quit smoking about 29 years ago. His smoking use included Cigarettes. He has a 20 pack-year smoking history. He has never used smokeless tobacco. He reports that he drinks about 7.0 ounces of alcohol per week. He reports that he does not use illicit drugs. family history includes Alcohol abuse in his other; Colitis in his father; Hypertension in his other; and Stroke in his other. No Known Allergies  1.  Risk factors based on Past Medical , Social, and Family history reviewed and as above 2.  Limitations in physical activities no respiratory Regular exercise 3.  Depression/mood no depression or anxiety issues 4.  Hearing chronic hearing loss. Uses hearing aids 5.  ADLs independent in all 6.  Cognitive function (orientation to time and place, language, writing, speech,memory) no memory deficits. Language and judgment intact 7.  Home Safety no issues 8.  Height, weight, and visual acuity. Some mild weight gain over the winter. Vision intact 9.  Counseling continue regular exercise. 10. Recommendation of  preventive services. Immunizations up-to-date. Colonoscopy up to date 11. Labs based on risk factors lipid, hepatic, and basic metabolic panel 12. Care Plan as above    Review of Systems  Constitutional: Negative for fever, activity change, appetite change and fatigue.  HENT: Negative for ear pain, congestion and trouble swallowing.   Eyes: Negative for pain and visual disturbance.  Respiratory: Negative for cough, shortness of breath and wheezing.   Cardiovascular: Negative for chest pain and palpitations.  Gastrointestinal: Negative for nausea, vomiting, abdominal pain, diarrhea, constipation, blood in stool, abdominal distention and rectal pain.  Genitourinary: Negative for dysuria, hematuria and testicular pain.  Musculoskeletal: Negative for joint swelling and arthralgias.  Skin: Negative for rash.  Neurological: Negative for dizziness, syncope and headaches.  Hematological: Negative for adenopathy.  Psychiatric/Behavioral: Negative for confusion and dysphoric mood.       Objective:   Physical Exam  Constitutional: He is oriented to person, place, and time. He appears well-developed and well-nourished. No distress.  HENT:  Head: Normocephalic and atraumatic.  Right Ear: External ear normal.  Left Ear: External ear normal.  Mouth/Throat: Oropharynx is clear and moist.  Eyes: Conjunctivae and EOM are normal. Pupils are equal, round, and reactive to light.  Neck: Normal range of motion. Neck supple. No thyromegaly present.  Cardiovascular: Normal rate, regular rhythm and normal heart sounds.   No murmur heard. Pulmonary/Chest: No respiratory distress. He has no wheezes. He has no rales.  Abdominal: Soft. Bowel sounds are normal. He exhibits no distension and no mass. There is no tenderness. There is no rebound and no guarding.  Musculoskeletal: He exhibits no edema.  Lymphadenopathy:    He has no cervical adenopathy.  Neurological: He is alert and oriented to person, place,  and time. He displays normal reflexes. No cranial nerve deficit.  Skin: No rash noted.  Psychiatric: He has a normal mood and affect.          Assessment & Plan:  Health maintenance. Immunizations up-to-date. Colonoscopy up to date. Continue regular exercise habits. Avoid further weight gain  Hyperlipidemia. Recheck lipid and hepatic panel. Continue low-dose Crestor. His been intolerant of higher doses or other statins  Hypertension. Well controlled. Continue benazepril  Pt called back.  Requesting referral to ophthalmology for possible blepharoplasty. Redundant upper lid tissue starting to affect vision.  Will make referral.

## 2012-07-04 ENCOUNTER — Encounter: Payer: Self-pay | Admitting: Family Medicine

## 2012-07-04 NOTE — Progress Notes (Signed)
Quick Note:  Pt informed on VM ______ 

## 2012-07-05 NOTE — Addendum Note (Signed)
Addended by: [name] on: 07/05/2012 04:32 PM   Modules accepted: Orders

## 2012-07-11 DIAGNOSIS — H02409 Unspecified ptosis of unspecified eyelid: Secondary | ICD-10-CM | POA: Diagnosis not present

## 2012-07-11 DIAGNOSIS — H02839 Dermatochalasis of unspecified eye, unspecified eyelid: Secondary | ICD-10-CM | POA: Diagnosis not present

## 2012-08-01 ENCOUNTER — Encounter: Payer: Medicare Other | Admitting: *Deleted

## 2012-08-02 ENCOUNTER — Other Ambulatory Visit: Payer: Self-pay

## 2012-08-02 ENCOUNTER — Ambulatory Visit (INDEPENDENT_AMBULATORY_CARE_PROVIDER_SITE_OTHER): Payer: Medicare Other | Admitting: *Deleted

## 2012-08-02 ENCOUNTER — Encounter: Payer: Self-pay | Admitting: Internal Medicine

## 2012-08-02 DIAGNOSIS — I495 Sick sinus syndrome: Secondary | ICD-10-CM

## 2012-08-02 DIAGNOSIS — Z950 Presence of cardiac pacemaker: Secondary | ICD-10-CM | POA: Diagnosis not present

## 2012-08-03 LAB — REMOTE PACEMAKER DEVICE
AL AMPLITUDE: 4.2 mv
AL IMPEDENCE PM: 390 Ohm
AL THRESHOLD: 0.75 v
ATRIAL PACING PM: 1
BAMS-0001: 150 {beats}/min
BAMS-0003: 70 {beats}/min
BATTERY VOLTAGE: 2.96 v
DEVICE MODEL PM: 7156802
RV LEAD AMPLITUDE: 11.3 mv
RV LEAD IMPEDENCE PM: 390 Ohm
RV LEAD THRESHOLD: 0.75 v
VENTRICULAR PACING PM: 1

## 2012-08-18 ENCOUNTER — Encounter: Payer: Self-pay | Admitting: *Deleted

## 2012-09-06 ENCOUNTER — Encounter: Payer: Self-pay | Admitting: Internal Medicine

## 2012-10-17 ENCOUNTER — Other Ambulatory Visit: Payer: Self-pay | Admitting: Dermatology

## 2012-10-17 DIAGNOSIS — L570 Actinic keratosis: Secondary | ICD-10-CM | POA: Diagnosis not present

## 2012-10-17 DIAGNOSIS — D044 Carcinoma in situ of skin of scalp and neck: Secondary | ICD-10-CM | POA: Diagnosis not present

## 2012-10-17 DIAGNOSIS — B353 Tinea pedis: Secondary | ICD-10-CM | POA: Diagnosis not present

## 2012-10-17 DIAGNOSIS — D485 Neoplasm of uncertain behavior of skin: Secondary | ICD-10-CM | POA: Diagnosis not present

## 2012-10-17 DIAGNOSIS — L821 Other seborrheic keratosis: Secondary | ICD-10-CM | POA: Diagnosis not present

## 2012-10-17 DIAGNOSIS — Z85828 Personal history of other malignant neoplasm of skin: Secondary | ICD-10-CM | POA: Diagnosis not present

## 2012-10-20 ENCOUNTER — Encounter: Payer: Self-pay | Admitting: Internal Medicine

## 2012-11-07 ENCOUNTER — Encounter: Payer: Medicare Other | Admitting: *Deleted

## 2012-11-08 ENCOUNTER — Encounter: Payer: Self-pay | Admitting: *Deleted

## 2012-11-08 ENCOUNTER — Ambulatory Visit (INDEPENDENT_AMBULATORY_CARE_PROVIDER_SITE_OTHER): Payer: Medicare Other | Admitting: *Deleted

## 2012-11-08 DIAGNOSIS — Z950 Presence of cardiac pacemaker: Secondary | ICD-10-CM | POA: Diagnosis not present

## 2012-11-08 DIAGNOSIS — I495 Sick sinus syndrome: Secondary | ICD-10-CM | POA: Diagnosis not present

## 2012-11-11 LAB — REMOTE PACEMAKER DEVICE
AL AMPLITUDE: 4.4 mv
AL IMPEDENCE PM: 360 Ohm
AL THRESHOLD: 0.875 V
ATRIAL PACING PM: 1
BAMS-0001: 150 {beats}/min
BAMS-0003: 70 {beats}/min
BATTERY VOLTAGE: 2.96 V
DEVICE MODEL PM: 7156802
RV LEAD AMPLITUDE: 6.9 mv
RV LEAD IMPEDENCE PM: 380 Ohm
RV LEAD THRESHOLD: 0.875 V
VENTRICULAR PACING PM: 1

## 2012-11-15 ENCOUNTER — Telehealth: Payer: Self-pay | Admitting: *Deleted

## 2012-11-15 NOTE — Telephone Encounter (Signed)
Confirmed w/ pt his manual home monitor transmission was received on 11/08/12 [name] [name]

## 2012-11-17 ENCOUNTER — Ambulatory Visit (INDEPENDENT_AMBULATORY_CARE_PROVIDER_SITE_OTHER): Payer: Medicare Other | Admitting: Family Medicine

## 2012-11-17 ENCOUNTER — Encounter: Payer: Self-pay | Admitting: Family Medicine

## 2012-11-17 VITALS — BP 120/72 | Temp 98.3°F | Wt 181.0 lb

## 2012-11-17 DIAGNOSIS — J069 Acute upper respiratory infection, unspecified: Secondary | ICD-10-CM | POA: Diagnosis not present

## 2012-11-17 MED ORDER — AMOXICILLIN 875 MG PO TABS: 875.0000 mg | ORAL_TABLET | Freq: Two times a day (BID) | ORAL | Status: DC

## 2012-11-17 NOTE — Patient Instructions (Addendum)
INSTRUCTIONS FOR UPPER RESPIRATORY INFECTION:  -plenty of rest and fluids  -nasal saline wash 2-3 times daily (use prepackaged nasal saline or bottled/distilled water if making your own)   -can use sinex/afrin nasal spray for drainage and nasal congestion - but do NOT use longer then 3-4 days  -can use tylenol or ibuprofen as directed for aches and sorethroat  -if you are taking a cough medication - use only as directed, may also try a teaspoon of honey to coat the throat and throat lozenges  -for sore throat, salt water gargles can help  -follow up if you have fevers, facial pain, tooth pain, difficulty breathing or are worsening or not getting better in 5-7 days

## 2012-11-17 NOTE — Progress Notes (Signed)
Chief Complaint  Patient presents with  . Sinusitis    HPI:  -started: 2 weeks ago -symptoms:nasal congestion, draingage, cough -denies:fever, SOB, NVD, tooth pain -has tried: musinex -sick contacts: wife had similar symptoms recently -Hx of: sinusitis    ROS: See pertinent positives and negatives per HPI.  Past Medical History  Diagnosis Date  . HYPERLIPIDEMIA 07/17/2008  . HYPERTENSION 07/17/2008  . SYNCOPE 07/24/2008  . CHEST WALL PAIN, ACUTE 10/11/2009  . 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 Sleep apnea     Family History  Problem Relation Age of Onset  . Hypertension Other   . Alcohol abuse Other   . Stroke Other   . Colitis Father     History   Social History  . Marital Status: Married    Spouse Name: N/A    Number of Children: N/A  . Years of Education: N/A   Social History Main Topics  . Smoking status: Former Smoker -- 1.00 packs/day for 20 years    Types: Cigarettes    Quit date: 08/27/1982  . Smokeless tobacco: Never Used  . Alcohol Use: 7.0 oz/week    14 drink(s) per week  . Drug Use: No  . Sexually Active: None   Other Topics Concern  . None   Social History Narrative  . None    Current outpatient prescriptions:benazepril (LOTENSIN) 20 MG tablet, Take 20 mg by mouth daily.  , Disp: , Rfl: ;  Coenzyme Q10-Vitamin E 100-300 MG-UNIT WAFR, Take 300 mg by mouth daily. , Disp: , Rfl: ;  Glucosamine-Chondroit-Vit C-Mn (GLUCOSAMINE 1500 COMPLEX) CAPS, Take 1 capsule by mouth daily.  , Disp: , Rfl: ;  Misc Natural Products (PROSTATE HEALTH) CAPS, Take 1 capsule by mouth.  , Disp: , Rfl:  Multiple Vitamin (MULTIVITAMIN PO), Take 1 tablet by mouth daily.  , Disp: , Rfl: ;  naproxen sodium (ANAPROX) 220 MG tablet, Take 220 mg by mouth daily.  , Disp: , Rfl: ;  Omega-3 Fatty Acids (FISH OIL) 1000 MG CAPS, Take 1 capsule by mouth daily.  , Disp: , Rfl: ;  rosuvastatin (CRESTOR) 5 MG tablet, Take 2.5 mg by mouth daily. , Disp: , Rfl: ;  vitamin C (ASCORBIC ACID)  500 MG tablet, Take 500 mg by mouth daily.  , Disp: , Rfl:  amoxicillin (AMOXIL) 875 MG tablet, Take 1 tablet (875 mg total) by mouth 2 (two) times daily., Disp: 20 tablet, Rfl: 0  EXAM:  Filed Vitals:   11/17/12 0802  BP: 120/72  Temp: 98.3 F (36.8 C)    Body mass index is 27.72 kg/(m^2).  GENERAL: vitals reviewed and listed above, alert, oriented, appears well hydrated and in no acute distress  HEENT: atraumatic, conjunttiva clear, no obvious abnormalities on inspection of external nose and ears, normal appearance of ear canals and TMs except for some scarring biat TMs, clear nasal congestion, mild post oropharyngeal erythema with PND, no tonsillar edema or exudate, no sinus TTP  NECK: no obvious masses on inspection  LUNGS: clear to auscultation bilaterally, no wheezes, rales or rhonchi, good air movement  CV: HRRR, no peripheral edema  MS: moves all extremities without noticeable abnormality  PSYCH: pleasant and cooperative, no obvious depression or anxiety  ASSESSMENT AND PLAN:  Discussed the following assessment and plan:  Upper respiratory infection - Plan: amoxicillin (AMOXIL) 875 MG tablet  -likely viral, advised supportive care - rx for abx if not improving or worsening, risks and return precautions discussed -Patient advised to return or notify  a doctor immediately if symptoms worsen or persist or new concerns arise.  Patient Instructions  INSTRUCTIONS FOR UPPER RESPIRATORY INFECTION:  -plenty of rest and fluids  -nasal saline wash 2-3 times daily (use prepackaged nasal saline or bottled/distilled water if making your own)   -can use sinex/afrin nasal spray for drainage and nasal congestion - but do NOT use longer then 3-4 days  -can use tylenol or ibuprofen as directed for aches and sorethroat  -if you are taking a cough medication - use only as directed, may also try a teaspoon of honey to coat the throat and throat lozenges  -for sore throat, salt  water gargles can help  -follow up if you have fevers, facial pain, tooth pain, difficulty breathing or are worsening or not getting better in 5-7 days      [name] Domingo R.

## 2012-11-23 ENCOUNTER — Other Ambulatory Visit: Payer: Self-pay

## 2013-01-05 DIAGNOSIS — Z23 Encounter for immunization: Secondary | ICD-10-CM | POA: Diagnosis not present

## 2013-01-06 ENCOUNTER — Encounter: Payer: Self-pay | Admitting: *Deleted

## 2013-01-10 DIAGNOSIS — H2510 Age-related nuclear cataract, unspecified eye: Secondary | ICD-10-CM | POA: Diagnosis not present

## 2013-01-10 DIAGNOSIS — H40039 Anatomical narrow angle, unspecified eye: Secondary | ICD-10-CM | POA: Diagnosis not present

## 2013-01-17 ENCOUNTER — Encounter: Payer: Self-pay | Admitting: Internal Medicine

## 2013-02-07 ENCOUNTER — Ambulatory Visit (INDEPENDENT_AMBULATORY_CARE_PROVIDER_SITE_OTHER): Payer: Medicare Other | Admitting: Internal Medicine

## 2013-02-07 ENCOUNTER — Encounter: Payer: Self-pay | Admitting: Internal Medicine

## 2013-02-07 VITALS — BP 144/72 | HR 70 | Ht 69.0 in | Wt 179.4 lb

## 2013-02-07 DIAGNOSIS — R55 Syncope and collapse: Secondary | ICD-10-CM | POA: Diagnosis not present

## 2013-02-07 DIAGNOSIS — I495 Sick sinus syndrome: Secondary | ICD-10-CM | POA: Diagnosis not present

## 2013-02-07 DIAGNOSIS — Z950 Presence of cardiac pacemaker: Secondary | ICD-10-CM | POA: Diagnosis not present

## 2013-02-07 LAB — PACEMAKER DEVICE OBSERVATION
AL AMPLITUDE: 3.3 mv
AL IMPEDENCE PM: 362.5 Ohm
AL THRESHOLD: 0.875 V
ATRIAL PACING PM: 1.2
BAMS-0001: 150 {beats}/min
BAMS-0003: 70 {beats}/min
BATTERY VOLTAGE: 2.9629 V
DEVICE MODEL PM: 7156802
RV LEAD AMPLITUDE: 7.6 mv
RV LEAD IMPEDENCE PM: 337.5 Ohm
RV LEAD THRESHOLD: 0.875 V
VENTRICULAR PACING PM: 1

## 2013-02-07 NOTE — Assessment & Plan Note (Signed)
1% atrial pacing without syncope

## 2013-02-07 NOTE — Assessment & Plan Note (Signed)
The patient's device was interrogated.  The information was reviewed. No changes were made in the programming.    

## 2013-02-07 NOTE — Assessment & Plan Note (Signed)
No recurrent syncope 

## 2013-02-07 NOTE — Patient Instructions (Signed)
Remote monitoring is used to monitor your Pacemaker of ICD from home. This monitoring reduces the number of office visits required to check your device to one time per year. It allows us to keep an eye on the functioning of your device to ensure it is working properly. You are scheduled for a device check from home on 05/09/2013. You may send your transmission at any time that day. If you have a wireless device, the transmission will be sent automatically. After your physician reviews your transmission, you will receive a postcard with your next transmission date.  Your physician wants you to follow-up in: one year with Dr. Klein.  You will receive a reminder letter in the mail two months in advance. If you don't receive a letter, please call our office to schedule the follow-up appointment.    

## 2013-02-07 NOTE — Progress Notes (Signed)
      Patient Care Team: [name], MD as PCP - General   HPI  [name] Repress Jadarion Halbig. is a 72 y.o. male Seen in followup for recurrent syncope associated with demonstrated sinus node dysfunction by implantable loop recorder. He is status post pacemaker implantation 10/11    No recurrent syncope. No complaints of chest pain or shortness of breath Past Medical History  Diagnosis Date  . HYPERLIPIDEMIA 07/17/2008  . HYPERTENSION 07/17/2008  . SYNCOPE 07/24/2008  . CHEST WALL PAIN, ACUTE 10/11/2009  . Sleep apnea   . Sinus node dysfunction 01/27/2011  . PACEMAKER-St.Jude 12/18/2008    Past Surgical History  Procedure Laterality Date  . Pacemaker placement      St Jude loop implantation 2010  . Colonoscopy      Current Outpatient Prescriptions  Medication Sig Dispense Refill  . amoxicillin (AMOXIL) 875 MG tablet Take 1 tablet (875 mg total) by mouth 2 (two) times daily.  20 tablet  0  . benazepril (LOTENSIN) 20 MG tablet Take 20 mg by mouth daily.        . Coenzyme Q10-Vitamin E 100-300 MG-UNIT WAFR Take 300 mg by mouth daily.       . Glucosamine-Chondroit-Vit C-Mn (GLUCOSAMINE 1500 COMPLEX) CAPS Take 1 capsule by mouth daily.        . Misc Natural Products (PROSTATE HEALTH) CAPS Take 1 capsule by mouth.        . Multiple Vitamin (MULTIVITAMIN PO) Take 1 tablet by mouth daily.        . naproxen sodium (ANAPROX) 220 MG tablet Take 220 mg by mouth daily.        . Omega-3 Fatty Acids (FISH OIL) 1000 MG CAPS Take 1 capsule by mouth daily.        . rosuvastatin (CRESTOR) 5 MG tablet Take 2.5 mg by mouth daily.       . vitamin C (ASCORBIC ACID) 500 MG tablet Take 500 mg by mouth daily.         No current facility-administered medications for this visit.    No Known Allergies  Review of Systems negative except from HPI and PMH  Physical Exam BP 144/72  Pulse 70  Ht 5\' 9"  (1.753 m)  Wt 179 lb 6.4 oz (81.375 kg)  BMI 26.48 kg/m2 Well developed and well nourished in no acute  distress HENT normal E scleral and icterus clear Neck Supple JVP flat; carotids brisk and full Clear to ausculation  Device pocket well healed; without hematoma or erythema.  There is no tethering Regular rate and rhythm, no murmurs gallops or rub Soft with active bowel sounds No clubbing cyanosis none Edema Alert and oriented, grossly normal motor and sensory function Skin Warm and Dry  ECG is sinus at 70 Intervals 16/10/37  Assessment and  Plan

## 2013-02-14 ENCOUNTER — Encounter: Payer: Self-pay | Admitting: Internal Medicine

## 2013-02-23 ENCOUNTER — Other Ambulatory Visit: Payer: Self-pay

## 2013-04-17 DIAGNOSIS — Z85828 Personal history of other malignant neoplasm of skin: Secondary | ICD-10-CM | POA: Diagnosis not present

## 2013-04-17 DIAGNOSIS — L570 Actinic keratosis: Secondary | ICD-10-CM | POA: Diagnosis not present

## 2013-04-17 DIAGNOSIS — L821 Other seborrheic keratosis: Secondary | ICD-10-CM | POA: Diagnosis not present

## 2013-05-09 ENCOUNTER — Encounter: Payer: Medicare Other | Admitting: *Deleted

## 2013-05-13 ENCOUNTER — Encounter: Payer: Self-pay | Admitting: Internal Medicine

## 2013-05-15 ENCOUNTER — Ambulatory Visit (INDEPENDENT_AMBULATORY_CARE_PROVIDER_SITE_OTHER): Payer: Medicare Other | Admitting: *Deleted

## 2013-05-15 DIAGNOSIS — I495 Sick sinus syndrome: Secondary | ICD-10-CM | POA: Diagnosis not present

## 2013-05-15 LAB — MDC_IDC_ENUM_SESS_TYPE_REMOTE
Battery Remaining Longevity: 119 mo
Battery Voltage: 2.96 V
Brady Statistic AP VP Percent: 1 %
Brady Statistic AP VS Percent: 1 %
Brady Statistic AS VP Percent: 1 %
Brady Statistic AS VS Percent: 99 %
Brady Statistic RA Percent Paced: 1 %
Brady Statistic RV Percent Paced: 1 %
Date Time Interrogation Session: 20150124192101
Implantable Pulse Generator Model: 2110
Implantable Pulse Generator Serial Number: 7156802
Lead Channel Impedance Value: 350 Ohm
Lead Channel Impedance Value: 350 Ohm
Lead Channel Pacing Threshold Amplitude: 0.75 V
Lead Channel Pacing Threshold Amplitude: 0.875 V
Lead Channel Pacing Threshold Pulse Width: 0.4 ms
Lead Channel Pacing Threshold Pulse Width: 0.4 ms
Lead Channel Sensing Intrinsic Amplitude: 3 mV
Lead Channel Sensing Intrinsic Amplitude: 7.7 mV
Lead Channel Setting Pacing Amplitude: 1 V
Lead Channel Setting Pacing Amplitude: 1.875
Lead Channel Setting Pacing Pulse Width: 0.4 ms
Lead Channel Setting Sensing Sensitivity: 2 mV

## 2013-05-16 ENCOUNTER — Encounter: Payer: Self-pay | Admitting: *Deleted

## 2013-05-30 ENCOUNTER — Encounter: Payer: Self-pay | Admitting: *Deleted

## 2013-08-16 ENCOUNTER — Ambulatory Visit (INDEPENDENT_AMBULATORY_CARE_PROVIDER_SITE_OTHER): Payer: Medicare Other | Admitting: *Deleted

## 2013-08-16 DIAGNOSIS — I495 Sick sinus syndrome: Secondary | ICD-10-CM | POA: Diagnosis not present

## 2013-08-22 LAB — MDC_IDC_ENUM_SESS_TYPE_REMOTE
Battery Remaining Longevity: 119 mo
Battery Voltage: 2.96 V
Brady Statistic AP VP Percent: 1 %
Brady Statistic AP VS Percent: 1 %
Brady Statistic AS VP Percent: 1 %
Brady Statistic AS VS Percent: 99 %
Brady Statistic RA Percent Paced: 1 %
Brady Statistic RV Percent Paced: 1 %
Date Time Interrogation Session: 20150429115411
Implantable Pulse Generator Model: 2110
Implantable Pulse Generator Serial Number: 7156802
Lead Channel Impedance Value: 350 Ohm
Lead Channel Impedance Value: 350 Ohm
Lead Channel Pacing Threshold Amplitude: 0.75 V
Lead Channel Pacing Threshold Amplitude: 0.875 V
Lead Channel Pacing Threshold Pulse Width: 0.4 ms
Lead Channel Pacing Threshold Pulse Width: 0.4 ms
Lead Channel Sensing Intrinsic Amplitude: 4.1 mV
Lead Channel Sensing Intrinsic Amplitude: 8.5 mV
Lead Channel Setting Pacing Amplitude: 1.125
Lead Channel Setting Pacing Amplitude: 1.75 V
Lead Channel Setting Pacing Pulse Width: 0.4 ms
Lead Channel Setting Sensing Sensitivity: 2 mV

## 2013-08-31 ENCOUNTER — Ambulatory Visit (INDEPENDENT_AMBULATORY_CARE_PROVIDER_SITE_OTHER): Payer: Medicare Other | Admitting: Family Medicine

## 2013-08-31 ENCOUNTER — Encounter: Payer: Self-pay | Admitting: Family Medicine

## 2013-08-31 VITALS — BP 126/70 | HR 63 | Temp 98.6°F | Ht 68.0 in | Wt 184.0 lb

## 2013-08-31 DIAGNOSIS — Z0000 Encounter for general adult medical examination without abnormal findings: Secondary | ICD-10-CM | POA: Diagnosis not present

## 2013-08-31 DIAGNOSIS — E785 Hyperlipidemia, unspecified: Secondary | ICD-10-CM

## 2013-08-31 DIAGNOSIS — Z23 Encounter for immunization: Secondary | ICD-10-CM | POA: Diagnosis not present

## 2013-08-31 DIAGNOSIS — I10 Essential (primary) hypertension: Secondary | ICD-10-CM

## 2013-08-31 DIAGNOSIS — Z125 Encounter for screening for malignant neoplasm of prostate: Secondary | ICD-10-CM | POA: Diagnosis not present

## 2013-08-31 LAB — BASIC METABOLIC PANEL
BUN: 19 mg/dL (ref 6–23)
CO2: 30 mEq/L (ref 19–32)
Calcium: 9.3 mg/dL (ref 8.4–10.5)
Chloride: 102 mEq/L (ref 96–112)
Creatinine, Ser: 0.8 mg/dL (ref 0.4–1.5)
GFR: 97.22 mL/min (ref >60.00)
Glucose, Bld: 91 mg/dL (ref 70–99)
Potassium: 4.8 mEq/L (ref 3.5–5.1)
Sodium: 137 mEq/L (ref 135–145)

## 2013-08-31 LAB — LIPID PANEL
Cholesterol: 194 mg/dL (ref 0–200)
HDL: 61.7 mg/dL (ref >39.00)
LDL Cholesterol: 122 mg/dL — ABNORMAL HIGH (ref 0–99)
Total CHOL/HDL Ratio: 3
Triglycerides: 53 mg/dL (ref 0.0–149.0)
VLDL: 10.6 mg/dL (ref 0.0–40.0)

## 2013-08-31 LAB — PSA: PSA: 0.86 ng/mL (ref 0.10–4.00)

## 2013-08-31 LAB — HEPATIC FUNCTION PANEL
ALT: 25 U/L (ref 0–53)
AST: 28 U/L (ref 0–37)
Albumin: 4.2 g/dL (ref 3.5–5.2)
Alkaline Phosphatase: 41 U/L (ref 39–117)
Bilirubin, Direct: 0.1 mg/dL (ref 0.0–0.3)
Total Bilirubin: 0.9 mg/dL (ref 0.2–1.2)
Total Protein: 6.6 g/dL (ref 6.0–8.3)

## 2013-08-31 NOTE — Progress Notes (Signed)
Pre visit review using our clinic review tool, if applicable. No additional management support is needed unless otherwise documented below in the visit note. 

## 2013-08-31 NOTE — Progress Notes (Signed)
Subjective:    Patient ID: [name] [name]., male    DOB: 11/03/1940, 73 y.o.   MRN: [id number]  HPI Patient seen for Medicare wellness exam and medical followup. He has hypertension treated with benazepril. Takes low-dose Crestor for hyperlipidemia He has past history of syncope secondary to sinoatrial node dysfunction. His pacemaker and has done well since then with no recurrent syncope. He's had previous 23 Valent pneumococcal vaccine but not Prevnar 13. Other immunizations up to date. Colonoscopy up to date.  Compliant with medications. No side effects. No chest pains. No dyspnea. No change in stool habits. Occasional nocturia.  Past Medical History  Diagnosis Date  . HYPERLIPIDEMIA 07/17/2008  . HYPERTENSION 07/17/2008  . SYNCOPE 07/24/2008  . CHEST WALL PAIN, ACUTE 10/11/2009  . Sleep apnea   . Sinus node dysfunction 01/27/2011  . PACEMAKER-St.Jude 12/18/2008   Past Surgical History  Procedure Laterality Date  . Pacemaker placement      St Jude loop implantation 2010  . Colonoscopy      reports that he quit smoking about 31 years ago. His smoking use included Cigarettes. He has a 20 pack-year smoking history. He has never used smokeless tobacco. He reports that he drinks about 7 ounces of alcohol per week. He reports that he does not use illicit drugs. family history includes Alcohol abuse in his other; Colitis in his father; Hypertension in his other; Stroke in his other. No Known Allergies  1.  Risk factors based on Past Medical , Social, and Family history reviewed and as above with no changes. 2.  Limitations in physical activities very active physically.  No recent falls. Exercises regularly. 3.  Depression/mood no depression issues. 4.  Hearing chronic loss bilaterally assisted 5.  ADLs independent in all. Works full time 6.  Cognitive function (orientation to time and place, language, writing, speech,memory) no memeory deficits.  Judgement intact. 7.  Home Safety no  issues. 8.  Height, weight, and visual acuity.all stable. 9.  Counseling as above. 10. Recommendation of preventive services. 11. Labs based on risk factors 12. Care Plan    Review of Systems  Constitutional: Negative for fever, activity change, appetite change and fatigue.  HENT: Negative for congestion, ear pain and trouble swallowing.   Eyes: Negative for pain and visual disturbance.  Respiratory: Negative for cough, shortness of breath and wheezing.   Cardiovascular: Negative for chest pain and palpitations.  Gastrointestinal: Negative for nausea, vomiting, abdominal pain, diarrhea, constipation, blood in stool, abdominal distention and rectal pain.  Genitourinary: Negative for dysuria, hematuria and testicular pain.  Musculoskeletal: Negative for joint swelling.  Skin: Negative for rash.  Neurological: Negative for dizziness, syncope and headaches.  Hematological: Negative for adenopathy.  Psychiatric/Behavioral: Negative for confusion and dysphoric mood.  All other systems reviewed and are negative.      Objective:   Physical Exam  Constitutional: He is oriented to person, place, and time. He appears well-developed and well-nourished. No distress.  HENT:  Head: Normocephalic and atraumatic.  Right Ear: External ear normal.  Left Ear: External ear normal.  Mouth/Throat: Oropharynx is clear and moist.  Eyes: Conjunctivae and EOM are normal. Pupils are equal, round, and reactive to light.  Neck: Normal range of motion. Neck supple. No thyromegaly present.  Cardiovascular: Normal rate, regular rhythm and normal heart sounds.   No murmur heard. Pulmonary/Chest: No respiratory distress. He has no wheezes. He has no rales.  Abdominal: Soft. Bowel sounds are normal. He exhibits no distension and  no mass. There is no tenderness. There is no rebound and no guarding.  Musculoskeletal: He exhibits no edema.  Lymphadenopathy:    He has no cervical adenopathy.  Neurological: He is  alert and oriented to person, place, and time. He displays normal reflexes. No cranial nerve deficit.  Skin: No rash noted.  Psychiatric: He has a normal mood and affect.          Assessment & Plan:  #1 health maintenance. Prevnar 13 given. Other immunizations up-to-date. #2 hypertension which is stable. Continue Lotensin #3 hyperlipidemia. Check lipid and hepatic panel. Continue low-dose Crestor. Previous intolerance with multiple other statins

## 2013-09-01 ENCOUNTER — Telehealth: Payer: Self-pay | Admitting: Family Medicine

## 2013-09-01 ENCOUNTER — Encounter: Payer: Self-pay | Admitting: Cardiology

## 2013-09-01 NOTE — Telephone Encounter (Signed)
Relevant patient education assigned to patient using Emmi. ° °

## 2013-09-05 ENCOUNTER — Encounter: Payer: Self-pay | Admitting: Internal Medicine

## 2013-10-16 DIAGNOSIS — L821 Other seborrheic keratosis: Secondary | ICD-10-CM | POA: Diagnosis not present

## 2013-10-16 DIAGNOSIS — Z85828 Personal history of other malignant neoplasm of skin: Secondary | ICD-10-CM | POA: Diagnosis not present

## 2013-10-16 DIAGNOSIS — L570 Actinic keratosis: Secondary | ICD-10-CM | POA: Diagnosis not present

## 2013-11-20 ENCOUNTER — Encounter: Payer: Self-pay | Admitting: Family Medicine

## 2013-11-20 ENCOUNTER — Ambulatory Visit (INDEPENDENT_AMBULATORY_CARE_PROVIDER_SITE_OTHER): Payer: Medicare Other | Admitting: *Deleted

## 2013-11-20 DIAGNOSIS — I495 Sick sinus syndrome: Secondary | ICD-10-CM

## 2013-11-21 LAB — MDC_IDC_ENUM_SESS_TYPE_REMOTE
Battery Remaining Longevity: 120 mo
Battery Remaining Percentage: 82 %
Battery Voltage: 2.96 V
Brady Statistic AP VP Percent: 1 %
Brady Statistic AP VS Percent: 1 %
Brady Statistic AS VP Percent: 1 %
Brady Statistic AS VS Percent: 99 %
Brady Statistic RA Percent Paced: 1 %
Brady Statistic RV Percent Paced: 1 %
Date Time Interrogation Session: 20150803154819
Implantable Pulse Generator Model: 2110
Implantable Pulse Generator Serial Number: 7156802
Lead Channel Impedance Value: 340 Ohm
Lead Channel Impedance Value: 390 Ohm
Lead Channel Pacing Threshold Amplitude: 0.75 V
Lead Channel Pacing Threshold Amplitude: 0.75 V
Lead Channel Pacing Threshold Pulse Width: 0.4 ms
Lead Channel Pacing Threshold Pulse Width: 0.4 ms
Lead Channel Sensing Intrinsic Amplitude: 3.6 mV
Lead Channel Sensing Intrinsic Amplitude: 8.2 mV
Lead Channel Setting Pacing Amplitude: 1 V
Lead Channel Setting Pacing Amplitude: 1.75 V
Lead Channel Setting Pacing Pulse Width: 0.4 ms
Lead Channel Setting Sensing Sensitivity: 2 mV

## 2013-11-21 NOTE — Progress Notes (Signed)
Remote pacemaker transmission.   

## 2013-11-22 ENCOUNTER — Telehealth: Payer: Self-pay | Admitting: Internal Medicine

## 2013-11-22 NOTE — Telephone Encounter (Signed)
Patient is scheduled for 12/11/13 10:30 with Amy Esterwood PA.  He is offered an earlier appt, but will be out of town until then

## 2013-12-11 ENCOUNTER — Encounter: Payer: Self-pay | Admitting: Physician Assistant

## 2013-12-11 ENCOUNTER — Ambulatory Visit (INDEPENDENT_AMBULATORY_CARE_PROVIDER_SITE_OTHER): Payer: Medicare Other | Admitting: Physician Assistant

## 2013-12-11 VITALS — BP 124/64 | HR 80 | Ht 67.5 in | Wt 183.5 lb

## 2013-12-11 DIAGNOSIS — Z86010 Personal history of colonic polyps: Secondary | ICD-10-CM

## 2013-12-11 DIAGNOSIS — R1013 Epigastric pain: Secondary | ICD-10-CM

## 2013-12-11 MED ORDER — OMEPRAZOLE 40 MG PO CPDR: 40.0000 mg | DELAYED_RELEASE_CAPSULE | Freq: Every day | ORAL | Status: DC

## 2013-12-11 NOTE — Progress Notes (Addendum)
Subjective:    Patient ID: [name] [name]., male    DOB: 20-Jun-1940, 73 y.o.   MRN: [id number]  HPI  Jahki is a pleasant 73 year old white male known to Dr. Hilarie Fredrickson from colonoscopy done in April of 2013. He was found to have mild diverticulosis and 2 sessile polyps which were tubular adenomas. He is scheduled for 3 year interval followup. He comes in today with a new problem of epigastric pain. He states he's been having abdominal discomfort for about 8 months with intermittent rumbling and gurgling in his abdomen and usually with very early morning of abdominal discomfort which sometimes awakens him from sleep at about 4 AM. He says usually if he gets up and takes Zantac this seems to help the pain subside.Marland Kitchen He describes it as a dull abdominal pain without radiation. His pain has not worsened over the past several months but has been persistent. His appetite has been fine His weight has been stable. He has not noted any change in his bowel habits nor any melena or hematochezia. He does feel that eating sometimes helps his abdominal discomfort as well. He is  not bothered very much during the day.  He has been taking Anaprox 220 mg on a daily basis for the past couple of years and says he does this for generalized aches and pains associated with "old age "not on any other aspirin products. Patient also has history of hypertension and sinus node dysfunction status post pacemaker.    Review of Systems  Constitutional: Negative.   HENT: Negative.   Eyes: Negative.   Respiratory: Negative.   Cardiovascular: Negative.   Gastrointestinal: Positive for abdominal pain.  Endocrine: Negative.   Genitourinary: Negative.   Musculoskeletal: Positive for arthralgias.  Skin: Negative.   Allergic/Immunologic: Negative.   Neurological: Negative.   Hematological: Negative.   Psychiatric/Behavioral: Negative.    Outpatient Prescriptions Prior to Visit  Medication Sig Dispense Refill  . benazepril  (LOTENSIN) 20 MG tablet Take 20 mg by mouth daily.        . Coenzyme Q10-Vitamin E 100-300 MG-UNIT WAFR Take 300 mg by mouth daily.       . Glucosamine-Chondroit-Vit C-Mn (GLUCOSAMINE 1500 COMPLEX) CAPS Take 1 capsule by mouth daily.        . Misc Natural Products (PROSTATE HEALTH) CAPS Take 1 capsule by mouth.        . Multiple Vitamin (MULTIVITAMIN PO) Take 1 tablet by mouth daily.        . naproxen sodium (ANAPROX) 220 MG tablet Take 220 mg by mouth daily.        . Omega-3 Fatty Acids (FISH OIL) 1000 MG CAPS Take 1 capsule by mouth daily.        . rosuvastatin (CRESTOR) 5 MG tablet Take 2.5 mg by mouth daily.       . vitamin C (ASCORBIC ACID) 500 MG tablet Take 500 mg by mouth daily.         No facility-administered medications prior to visit.   No Known Allergies Patient Active Problem List   Diagnosis Date Noted  . History of adenomatous polyp of colon 08/11/2011  . Sinus node dysfunction 01/27/2011  . CHEST WALL PAIN, ACUTE 10/11/2009  . 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 SYNCOPE 07/24/2008  . HYPERLIPIDEMIA 07/17/2008  . HYPERTENSION 07/17/2008   History  Substance Use Topics  . Smoking status: Former Smoker -- 1.00 packs/day for 20 years    Types: Cigarettes    Quit date: 08/27/1982  .  Smokeless tobacco: Never Used  . Alcohol Use: 7.0 oz/week    14 drink(s) per week   family history includes Alcohol abuse in his maternal aunt; Colitis in his father; Hypertension in his father and mother; Stroke in his mother.     Objective:   Physical Exam  well-developed white male in no acute distress, pleasant blood pressure 124/64 pulse 80 height 5 foot 7 weight 183. HEENT; nontraumatic normocephalic EOMI PERRLA sclera anicteric, Supple ;no JVD, Cardiovascular; regular rate and rhythm with S1-S2 no murmur or gallop, capillary clear bilaterally, Abdomen; soft mildly tender in the epigastrium there is no guarding or rebound no palpable mass or hepatosplenomegaly bowel sounds are present,  Rectal; exam not done, Extremities; no clubbing cyanosis or edema skin warm and dry, Psych; mood and affect appropriate        Assessment & Plan:  #49  73 year old male with several month history of dull epigastric slice hypogastric abdominal pain usually most notable in the early morning hours before eating. He is on chronic NSAIDs and suspect she may have an NSAID-induced gastropathy or ulcer. #2 history of adenomatous colon polyps due for followup colonoscopy 2016 #3 sinus node dysfunction status post pacemaker #4 hypertension  Plan; Discussed upper endoscopy with the patient. He would prefer to try medical therapy first and therefore have asked him to stop taking Anaprox and and use Tylenol if needed for arthritic symptoms Start Prilosec 40 mg by mouth every morning Discussed baseline labs, he did have labs done in May with his PCP which were unremarkable and is having his annual labs done through the [place] later this week. He will get copies and bring them with him to his next appointment [facility] plan office followup with Dr. Hilarie Fredrickson or myself in one month, if his symptoms have not completely resolved he will need upper endoscopy and/or if his symptoms worsen in the interim he is to call back and will schedule EGD at that time.  Addendum: Reviewed and agree with initial management. Jerene Bears, MD

## 2013-12-11 NOTE — Patient Instructions (Signed)
Stop the Naproxen.  We made you a follow up appointment with Amy Esterwood PA-C on 9-29- at 10:30 am .  We have given you a prescription for Prilosec 40 mg to take to the [place].  Please get a copy of your labs your are having done and bring the results with you when you see Amy on 9-29.

## 2013-12-12 ENCOUNTER — Encounter: Payer: Self-pay | Admitting: Cardiology

## 2013-12-19 ENCOUNTER — Encounter: Payer: Self-pay | Admitting: Internal Medicine

## 2014-01-15 DIAGNOSIS — Z23 Encounter for immunization: Secondary | ICD-10-CM | POA: Diagnosis not present

## 2014-01-16 ENCOUNTER — Encounter: Payer: Self-pay | Admitting: Physician Assistant

## 2014-01-16 ENCOUNTER — Ambulatory Visit (INDEPENDENT_AMBULATORY_CARE_PROVIDER_SITE_OTHER): Payer: Medicare Other | Admitting: Physician Assistant

## 2014-01-16 VITALS — BP 124/66 | HR 70 | Ht 67.5 in | Wt 183.4 lb

## 2014-01-16 DIAGNOSIS — R1013 Epigastric pain: Secondary | ICD-10-CM | POA: Diagnosis not present

## 2014-01-16 NOTE — Progress Notes (Addendum)
Subjective:    Patient ID: [name] Binet., male    DOB: 07-01-1940, 73 y.o.   MRN: [id number]  HPI  [name] [name] is a pleasant 73 year old white male known to Dr. Hilarie Fredrickson. He was last seen in the office about a month ago with complaints of epigastric pain. He states that he had been having symptoms for about 8 months with intermittent rumbling and gurgling in his abdomen and was waking up most mornings at about 4 AM with achy-type abdominal pain. He had been taking Zantac which was helpful. His weight has been stable. He had not noted any changes in his bowel habits. Appetite is fine. No nausea or indigestion. He had been taking Anaprox chronically and this has since been discontinued. At the time of last office visit he was switched to Prilosec 40 mg by mouth daily. We discussed endoscopy but he preferred to try medication first. He comes in today stating that he is still having symptoms. He had gotten Prilosec from the [place] in 20 mg tablets and has generally been taking 2 at a time. However he is waiting until he wakes up at 4 or 5 AM ,hurting to take the medication. He states usually it subsides within an hour of taking the medicine and he is fine for the rest of the day. He says he is a bit concerned at this point because the pain is persisting.  Last colonoscopy was done in April of 2013, he had mild diverticulosis and 2 tubular adenomas which were removed he is to have followup at a 3 year interval.    Review of Systems  Constitutional: Negative.   HENT: Negative.   Eyes: Negative.   Respiratory: Negative.   Cardiovascular: Negative.   Gastrointestinal: Positive for abdominal pain.  Endocrine: Negative.   Genitourinary: Negative.   Musculoskeletal: Positive for arthralgias.  Skin: Negative.   Allergic/Immunologic: Negative.   Neurological: Negative.   Hematological: Negative.   Psychiatric/Behavioral: Negative.    Outpatient Prescriptions Prior to Visit  Medication Sig Dispense  Refill  . benazepril (LOTENSIN) 20 MG tablet Take 20 mg by mouth daily.        . Coenzyme Q10-Vitamin E 100-300 MG-UNIT WAFR Take 300 mg by mouth daily.       . Glucosamine-Chondroit-Vit C-Mn (GLUCOSAMINE 1500 COMPLEX) CAPS Take 1 capsule by mouth daily.        . Misc Natural Products (PROSTATE HEALTH) CAPS Take 1 capsule by mouth.        . Multiple Vitamin (MULTIVITAMIN PO) Take 1 tablet by mouth daily.        . Omega-3 Fatty Acids (FISH OIL) 1000 MG CAPS Take 1 capsule by mouth daily.        . rosuvastatin (CRESTOR) 5 MG tablet Take 2.5 mg by mouth daily.       . vitamin C (ASCORBIC ACID) 500 MG tablet Take 500 mg by mouth daily.        . naproxen sodium (ANAPROX) 220 MG tablet Take 220 mg by mouth daily.        Marland Kitchen omeprazole (PRILOSEC) 40 MG capsule Take 1 capsule (40 mg total) by mouth daily.  90 capsule  3   No facility-administered medications prior to visit.   No Known Allergies Patient Active Problem List   Diagnosis Date Noted  . History of adenomatous polyp of colon 08/11/2011  . Sinus node dysfunction 01/27/2011  . CHEST WALL PAIN, ACUTE 10/11/2009  . 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 SYNCOPE 07/24/2008  .  HYPERLIPIDEMIA 07/17/2008  . HYPERTENSION 07/17/2008   History  Substance Use Topics  . Smoking status: Former Smoker -- 1.00 packs/day for 20 years    Types: Cigarettes    Quit date: 08/27/1982  . Smokeless tobacco: Never Used  . Alcohol Use: 7.0 oz/week    14 drink(s) per week   family history includes Alcohol abuse in his maternal aunt; Colitis in his father; Hypertension in his father and mother; Stroke in his mother.     Objective:   Physical Exam well-developed older white male in no acute distress, pleasant blood pressure 124/66 pulse 70 height 5 foot 7 weight 183. HEENT; nontraumatic normocephalic EOMI PERRLA sclera anicteric, Supple no JVD, Cardiovascular;egular rate and rhythm with S1-S2 no murmur or gallop, Pulmonary ;clear bilaterally, Abdomen ;soft  basically nontender there is no palpable mass or hepatosplenomegaly, bowel sounds are present, Rectal; exam not done, Extremities ;no clubbing cyanosis or edema skin warm and dry, Psych; mood and affect appropriate        Assessment & Plan:  #74  73 year old male with a several month history of epigastric pain generally waking him from sleep at 4 to 5 AM and relieved with acid blockers. Patient is now on PPI therapy and having persistent symptoms. Need to rule out peptic ulcer disease, gastropathy or occult lesion. #2 history of adenomatous colon polyps due for follow up colonoscopy 2016 #3 status post pacemaker #4 hypertension  Plan; Prilosec will be changed to 20 mg by mouth q. a.m. before breakfast and 20 mg a.c. Dinner Schedule for upper endoscopy with Dr. Hilarie Fredrickson. Procedure discussed in detail with the patient and he is agreeable to proceed. If EGD is negative would proceed with CT scan of the abdomen and pelvis.  Addendum: Reviewed and agree with initial management. Jerene Bears, MD

## 2014-01-16 NOTE — Patient Instructions (Addendum)
You have been scheduled for an endoscopy. Please follow written instructions given to you at your visit today. If you use inhalers (even only as needed), please bring them with you on the day of your procedure. Take the prilosec 20 mg, take 1 cap in the morning with breakfast and take a 2nd dose with your evening meal.

## 2014-02-02 ENCOUNTER — Other Ambulatory Visit: Payer: Self-pay

## 2014-02-05 ENCOUNTER — Ambulatory Visit (AMBULATORY_SURGERY_CENTER): Payer: Medicare Other | Admitting: Internal Medicine

## 2014-02-05 ENCOUNTER — Encounter: Payer: Self-pay | Admitting: Internal Medicine

## 2014-02-05 VITALS — BP 143/77 | HR 50 | Temp 96.6°F | Resp 17 | Ht 67.5 in | Wt 183.0 lb

## 2014-02-05 DIAGNOSIS — I10 Essential (primary) hypertension: Secondary | ICD-10-CM | POA: Diagnosis not present

## 2014-02-05 DIAGNOSIS — E669 Obesity, unspecified: Secondary | ICD-10-CM | POA: Diagnosis not present

## 2014-02-05 DIAGNOSIS — K2950 Unspecified chronic gastritis without bleeding: Secondary | ICD-10-CM | POA: Diagnosis not present

## 2014-02-05 DIAGNOSIS — K2990 Gastroduodenitis, unspecified, without bleeding: Secondary | ICD-10-CM

## 2014-02-05 DIAGNOSIS — R1013 Epigastric pain: Secondary | ICD-10-CM | POA: Diagnosis not present

## 2014-02-05 DIAGNOSIS — R55 Syncope and collapse: Secondary | ICD-10-CM | POA: Diagnosis not present

## 2014-02-05 DIAGNOSIS — K2970 Gastritis, unspecified, without bleeding: Secondary | ICD-10-CM | POA: Diagnosis not present

## 2014-02-05 DIAGNOSIS — K3189 Other diseases of stomach and duodenum: Secondary | ICD-10-CM | POA: Diagnosis not present

## 2014-02-05 DIAGNOSIS — K208 Other esophagitis: Secondary | ICD-10-CM | POA: Diagnosis not present

## 2014-02-05 DIAGNOSIS — G4733 Obstructive sleep apnea (adult) (pediatric): Secondary | ICD-10-CM | POA: Diagnosis not present

## 2014-02-05 HISTORY — PX: ESOPHAGOGASTRODUODENOSCOPY (EGD) WITH PROPOFOL: SHX5813

## 2014-02-05 MED ORDER — SODIUM CHLORIDE 0.9 % IV SOLN: 500.0000 mL | INTRAVENOUS | Status: DC

## 2014-02-05 NOTE — Progress Notes (Signed)
Report to PACU, RN, vss, BBS= Clear.  

## 2014-02-05 NOTE — Patient Instructions (Signed)
Discharge instructions given with verbal understanding. Biopsies taken. Resume previous medications. YOU HAD AN ENDOSCOPIC PROCEDURE TODAY AT THE [facility]: Refer to the procedure report that was given to you for any specific questions about what was found during the examination.  If the procedure report does not answer your questions, please call your gastroenterologist to clarify.  If you requested that your care partner not be given the details of your procedure findings, then the procedure report has been included in a sealed envelope for you to review at your convenience later.  YOU SHOULD EXPECT: Some feelings of bloating in the abdomen. Passage of more gas than usual.  Walking can help get rid of the air that was put into your GI tract during the procedure and reduce the bloating. If you had a lower endoscopy (such as a colonoscopy or flexible sigmoidoscopy) you may notice spotting of blood in your stool or on the toilet paper. If you underwent a bowel prep for your procedure, then you may not have a normal bowel movement for a few days.  DIET: Your first meal following the procedure should be a light meal and then it is ok to progress to your normal diet.  A half-sandwich or bowl of soup is an example of a good first meal.  Heavy or fried foods are harder to digest and may make you feel nauseous or bloated.  Likewise meals heavy in dairy and vegetables can cause extra gas to form and this can also increase the bloating.  Drink plenty of fluids but you should avoid alcoholic beverages for 24 hours.  ACTIVITY: Your care partner should take you home directly after the procedure.  You should plan to take it easy, moving slowly for the rest of the day.  You can resume normal activity the day after the procedure however you should NOT DRIVE or use heavy machinery for 24 hours (because of the sedation medicines used during the test).    SYMPTOMS TO REPORT IMMEDIATELY: A gastroenterologist  can be reached at any hour.  During normal business hours, 8:30 AM to 5:00 PM Monday through Friday, call [phone].  After hours and on weekends, please call the GI answering service at [phone] who will take a message and have the physician on call contact you.   Following upper endoscopy (EGD)  Vomiting of blood or coffee ground material  New chest pain or pain under the shoulder blades  Painful or persistently difficult swallowing  New shortness of breath  Fever of 100F or higher  Black, tarry-looking stools  FOLLOW UP: If any biopsies were taken you will be contacted by phone or by letter within the next 1-3 weeks.  Call your gastroenterologist if you have not heard about the biopsies in 3 weeks.  Our staff will call the home number listed on your records the next business day following your procedure to check on you and address any questions or concerns that you may have at that time regarding the information given to you following your procedure. This is a courtesy call and so if there is no answer at the home number and we have not heard from you through the emergency physician on call, we will assume that you have returned to your regular daily activities without incident.  SIGNATURES/CONFIDENTIALITY: You and/or your care partner have signed paperwork which will be entered into your electronic medical record.  These signatures attest to the fact that that the information above on your After   Visit Summary has been reviewed and is understood.  Full responsibility of the confidentiality of this discharge information lies with you and/or your care-partner. 

## 2014-02-05 NOTE — Op Note (Signed)
[facility]  [address] [place], [id number]   ENDOSCOPY PROCEDURE REPORT  PATIENT: [name] [name]  MR#: [id number] BIRTHDATE: 16-Apr-1941 , 72  yrs. old GENDER: male ENDOSCOPIST: Jerene Bears, MD PROCEDURE DATE:  02/05/2014 PROCEDURE:  EGD w/ biopsy ASA CLASS:     Class III INDICATIONS:  epigastric pain. MEDICATIONS: Monitored anesthesia care and Propofol 200 mg IV TOPICAL ANESTHETIC: none  DESCRIPTION OF PROCEDURE: After the risks benefits and alternatives of the procedure were thoroughly explained, informed consent was obtained.  The LB [id number] endoscope was introduced through the mouth and advanced to the second portion of the duodenum , Without limitations.  The instrument was slowly withdrawn as the mucosa was fully examined.   ESOPHAGUS: The Z line, at 39cm,  appeared irregular and biopsies were taken.   If consistent with Barrett's, Prague Criteria C0M1. The esophagus was otherwise normal.  STOMACH: There was mild antral gastropathy noted.  Cold forcep biopsies were taken at the antrum and angularis.  DUODENUM: Polypoid mucosa was found in the duodenal bulb, query Brunner's gland hyperplasia.  Multiple biopsies were performed using cold forceps.  The remaining examined duodenum was unremarkable to the second portion.  Retroflexed views revealed no abnormalities.     The scope was then withdrawn from the patient and the procedure completed.  COMPLICATIONS: There were no immediate complications.      ENDOSCOPIC IMPRESSION: 1.   Irregular z-line was located 39cm from the incisors; Biopsies to exclude Barrett's esophagus 2.   The esophagus was otherwise normal. 3.   There was mild antral gastropathy noted; multiple biopsies 4.   Polypoid tissue was found in the duodenal bulb; multiple biopsies were performed  RECOMMENDATIONS: 1.  Await biopsy results 2.  Continue taking your PPI (antiacid medicine) as prescribed.  It is best to  be taken 20-30 minutes prior to a meal. 3.  Follow-up of helicobacter pylori status, treat if indicated 4.  If biopsies are unrevealing would pursue imaging to evaluate epigastric abdominal pain which is worse at night   eSigned:  Jerene Bears, MD 02/05/2014 10:29 AM    CC:The Patient and Carolann Littler, MD  PATIENT NAME:  [name] [name], [name] [name] MR#: [id number]

## 2014-02-05 NOTE — Progress Notes (Signed)
Called to room to assist during endoscopic procedure.  Patient ID and intended procedure confirmed with present staff. Received instructions for my participation in the procedure from the performing physician.  

## 2014-02-06 ENCOUNTER — Telehealth: Payer: Self-pay

## 2014-02-06 NOTE — Telephone Encounter (Signed)
  Follow up Call-  Call back number 02/05/2014 08/14/2011  Post procedure Call Back phone  # [phone]  Permission to leave phone message Yes Yes     Patient questions:  Do you have a fever, pain , or abdominal swelling? No. Pain Score  0 *  Have you tolerated food without any problems? Yes.    Have you been able to return to your normal activities? Yes.    Do you have any questions about your discharge instructions: Diet   No. Medications  No. Follow up visit  No.  Do you have questions or concerns about your Care? No.  Actions: * If pain score is 4 or above: No action needed, pain <4.

## 2014-02-14 ENCOUNTER — Encounter: Payer: Self-pay | Admitting: Internal Medicine

## 2014-02-19 ENCOUNTER — Other Ambulatory Visit: Payer: Self-pay

## 2014-02-19 DIAGNOSIS — R109 Unspecified abdominal pain: Secondary | ICD-10-CM

## 2014-02-20 ENCOUNTER — Telehealth: Payer: Self-pay

## 2014-02-20 NOTE — Telephone Encounter (Signed)
Pt scheduled for [place] of abd at [facility] 02/26/14@8am , pt to arrive there at 7:45am. Pt to be NPO after midnight. Pt aware of appt.

## 2014-02-21 ENCOUNTER — Other Ambulatory Visit: Payer: Self-pay | Admitting: Dermatology

## 2014-02-21 DIAGNOSIS — Z85828 Personal history of other malignant neoplasm of skin: Secondary | ICD-10-CM | POA: Diagnosis not present

## 2014-02-21 DIAGNOSIS — D0439 Carcinoma in situ of skin of other parts of face: Secondary | ICD-10-CM | POA: Diagnosis not present

## 2014-02-21 DIAGNOSIS — D485 Neoplasm of uncertain behavior of skin: Secondary | ICD-10-CM | POA: Diagnosis not present

## 2014-02-21 DIAGNOSIS — L821 Other seborrheic keratosis: Secondary | ICD-10-CM | POA: Diagnosis not present

## 2014-02-21 DIAGNOSIS — L570 Actinic keratosis: Secondary | ICD-10-CM | POA: Diagnosis not present

## 2014-02-26 ENCOUNTER — Ambulatory Visit (HOSPITAL_COMMUNITY)
Admission: RE | Admit: 2014-02-26 | Discharge: 2014-02-26 | Disposition: A | Discharge status: Home / Self Care | Payer: Medicare Other | Source: Ambulatory Visit | Attending: Internal Medicine | Admitting: Internal Medicine

## 2014-02-26 DIAGNOSIS — K7689 Other specified diseases of liver: Secondary | ICD-10-CM | POA: Diagnosis not present

## 2014-02-26 DIAGNOSIS — R109 Unspecified abdominal pain: Secondary | ICD-10-CM | POA: Diagnosis not present

## 2014-02-26 DIAGNOSIS — K769 Liver disease, unspecified: Secondary | ICD-10-CM | POA: Diagnosis not present

## 2014-02-26 DIAGNOSIS — N281 Cyst of kidney, acquired: Secondary | ICD-10-CM | POA: Insufficient documentation

## 2014-02-26 DIAGNOSIS — K828 Other specified diseases of gallbladder: Secondary | ICD-10-CM | POA: Diagnosis not present

## 2014-02-28 ENCOUNTER — Telehealth: Payer: Self-pay | Admitting: Internal Medicine

## 2014-02-28 NOTE — Telephone Encounter (Signed)
Pt calling for results of [place], please advise.

## 2014-03-01 NOTE — Telephone Encounter (Signed)
Discussed with pt that Dr. Hilarie Fredrickson is out of town and that we will call him next week when Dr. Hilarie Fredrickson returns.

## 2014-03-01 NOTE — Telephone Encounter (Signed)
Pt calling again to see about results from U/S.  Told pt Dr. Hilarie Fredrickson was not here this week and that may be why he has not heard anything.  He said he would like a call back from a nurse.

## 2014-03-05 ENCOUNTER — Other Ambulatory Visit: Payer: Self-pay

## 2014-03-05 DIAGNOSIS — K769 Liver disease, unspecified: Secondary | ICD-10-CM

## 2014-03-05 NOTE — Telephone Encounter (Signed)
See result note.  

## 2014-03-12 ENCOUNTER — Ambulatory Visit (INDEPENDENT_AMBULATORY_CARE_PROVIDER_SITE_OTHER): Payer: Medicare Other | Admitting: Internal Medicine

## 2014-03-12 ENCOUNTER — Encounter: Payer: Self-pay | Admitting: Internal Medicine

## 2014-03-12 VITALS — BP 154/72 | HR 57 | Ht 67.0 in | Wt 186.8 lb

## 2014-03-12 DIAGNOSIS — Z45018 Encounter for adjustment and management of other part of cardiac pacemaker: Secondary | ICD-10-CM

## 2014-03-12 DIAGNOSIS — I495 Sick sinus syndrome: Secondary | ICD-10-CM | POA: Diagnosis not present

## 2014-03-12 LAB — MDC_IDC_ENUM_SESS_TYPE_INCLINIC
Battery Remaining Longevity: 138 mo
Battery Voltage: 2.95 V
Brady Statistic RA Percent Paced: 0.88 %
Brady Statistic RV Percent Paced: 0.07 %
Date Time Interrogation Session: 20151123093950
Implantable Pulse Generator Model: 2110
Implantable Pulse Generator Serial Number: 7156802
Lead Channel Impedance Value: 337.5 Ohm
Lead Channel Impedance Value: 400 Ohm
Lead Channel Pacing Threshold Amplitude: 0.75 V
Lead Channel Pacing Threshold Amplitude: 0.875 V
Lead Channel Pacing Threshold Pulse Width: 0.4 ms
Lead Channel Pacing Threshold Pulse Width: 0.4 ms
Lead Channel Sensing Intrinsic Amplitude: 4 mV
Lead Channel Sensing Intrinsic Amplitude: 9.3 mV
Lead Channel Setting Pacing Amplitude: 1 V
Lead Channel Setting Pacing Amplitude: 1.875
Lead Channel Setting Pacing Pulse Width: 0.4 ms
Lead Channel Setting Sensing Sensitivity: 2 mV

## 2014-03-12 NOTE — Progress Notes (Signed)
Patient Care Team: [name], MD as PCP - General   HPI  [name] [name]. is a 73 y.o. male Seen in followup for recurrent syncope associated with demonstrated sinus node dysfunction by implantable loop recorder. He is status post pacemaker implantation 10/11    No recurrent syncope. No complaints of chest pain or shortness of breath  His blood pressure home runs in the 130 range or so. Past Medical History  Diagnosis Date  . HYPERLIPIDEMIA 07/17/2008  . HYPERTENSION 07/17/2008  . SYNCOPE 07/24/2008  . CHEST WALL PAIN, ACUTE 10/11/2009  . Sleep apnea   . Sinus node dysfunction 01/27/2011  . 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 GI problem     Past Surgical History  Procedure Laterality Date  . Pacemaker placement      St Jude loop implantation 2010  . Colonoscopy      Current Outpatient Prescriptions  Medication Sig Dispense Refill  . benazepril (LOTENSIN) 20 MG tablet Take 20 mg by mouth daily.      . Coenzyme Q10-Vitamin E 100-300 MG-UNIT WAFR Take 300 mg by mouth daily.     . Glucosamine-Chondroit-Vit C-Mn (GLUCOSAMINE 1500 COMPLEX) CAPS Take 1 capsule by mouth daily.      . Misc Natural Products (PROSTATE HEALTH) CAPS Take 1 capsule by mouth.      . Multiple Vitamin (MULTIVITAMIN PO) Take 1 tablet by mouth daily.      . Omega-3 Fatty Acids (FISH OIL) 1000 MG CAPS Take 1 capsule by mouth daily.      Marland Kitchen omeprazole (PRILOSEC) 20 MG capsule Take 40 mg by mouth daily.    . rosuvastatin (CRESTOR) 5 MG tablet Take 2.5 mg by mouth daily.     . vitamin C (ASCORBIC ACID) 500 MG tablet Take 500 mg by mouth daily.       No current facility-administered medications for this visit.    No Known Allergies  Review of Systems negative except from HPI and PMH  Physical Exam BP 154/72 mmHg  Pulse 57  Ht 5\' 7"  (1.702 m)  Wt 84.732 kg (186 lb 12.8 oz)  BMI 29.25 kg/m2 Well developed and well nourished in no acute distress HENT normal E scleral and icterus clear Neck  Supple JVP flat; carotids brisk and full Clear to ausculation  Device pocket well healed; without hematoma or erythema.  There is no tethering Regular rate and rhythm, no murmurs gallops or rub Soft with active bowel sounds No clubbing cyanosis none Edema Alert and oriented, grossly normal motor and sensory function Skin Warm and Dry  ECG is sinus at 57 Intervals 18/11/40  Assessment and  Plan  Sinus node dysfunction  Syncope  Pacemaker-St. Jude The patient's device was interrogated.  The information was reviewed. No changes were made in the programming.    Hypertension  From an arrhythmia point of view the patient is stable. His pacemaker functions almost never but he has had no recurrent syncope  His blood pressure is high on repeat evaluation today 165. We had a long discussion about the important risks associated with hypertension and new targets based on the SPRINT TRIAL which are now 120. I suggested that he use his home blood pressure machine in conjunction with the blood pressure machines at local pharmacies and/or Dr. Elease Hashimoto to ascertain her blood pressures are at home and if they are in excess of 130-40    for augmented therapy  We spent more than 50% of our >25 min  visit in face to face counseling regarding the above

## 2014-03-12 NOTE — Addendum Note (Signed)
Addended by: Stanton Kidney on: 03/12/2014 10:04 AM   Modules accepted: Level of Service

## 2014-03-12 NOTE — Patient Instructions (Signed)

## 2014-03-13 ENCOUNTER — Encounter: Payer: Self-pay | Admitting: Internal Medicine

## 2014-03-18 ENCOUNTER — Encounter: Payer: Self-pay | Admitting: Internal Medicine

## 2014-03-19 ENCOUNTER — Ambulatory Visit (HOSPITAL_COMMUNITY): Admission: RE | Admit: 2014-03-19 | Payer: Medicare Other | Source: Ambulatory Visit

## 2014-03-19 ENCOUNTER — Telehealth: Payer: Self-pay | Admitting: Internal Medicine

## 2014-03-19 NOTE — Telephone Encounter (Signed)
Spoke with [name] [name] @ [facility].  The patient arrived for his 9am appointment and had to be canceled because he is not MRI compatible.  Per [name] [name] the patient was upset and stated that we([facility]) was aware of the appointment. "  We received an email from Mr. Raju 03/18/14 @ 6:29 pm.

## 2014-03-19 NOTE — Telephone Encounter (Signed)
Pt went for MRI this am but this could not be done due to pts pacemaker. Per MRI Dr. Hilarie Fredrickson could call Dr. [name] Cooks today at [phone] to discuss alternatives to MRI. Dr. Tonye Royalty notified.

## 2014-03-19 NOTE — Telephone Encounter (Signed)
This was handled by device clinic this morning.

## 2014-03-19 NOTE — Telephone Encounter (Signed)
Would proceed with CT scan of the abdomen and pelvis with contrast, same indication as MRI

## 2014-03-20 ENCOUNTER — Other Ambulatory Visit: Payer: Self-pay

## 2014-03-20 DIAGNOSIS — K769 Liver disease, unspecified: Secondary | ICD-10-CM

## 2014-03-20 NOTE — Telephone Encounter (Signed)
Pt scheduled for CT of A/P at [facility] CT 03/27/14@10 :30am. Pt aware and will pick up contrast with instructions and have labs done this week.

## 2014-03-21 ENCOUNTER — Other Ambulatory Visit: Payer: Self-pay | Admitting: Dermatology

## 2014-03-21 ENCOUNTER — Other Ambulatory Visit (INDEPENDENT_AMBULATORY_CARE_PROVIDER_SITE_OTHER): Payer: Medicare Other

## 2014-03-21 DIAGNOSIS — C44319 Basal cell carcinoma of skin of other parts of face: Secondary | ICD-10-CM | POA: Diagnosis not present

## 2014-03-21 DIAGNOSIS — D0439 Carcinoma in situ of skin of other parts of face: Secondary | ICD-10-CM | POA: Diagnosis not present

## 2014-03-21 DIAGNOSIS — K7689 Other specified diseases of liver: Secondary | ICD-10-CM | POA: Diagnosis not present

## 2014-03-21 DIAGNOSIS — K769 Liver disease, unspecified: Secondary | ICD-10-CM

## 2014-03-21 DIAGNOSIS — Z85828 Personal history of other malignant neoplasm of skin: Secondary | ICD-10-CM | POA: Diagnosis not present

## 2014-03-21 DIAGNOSIS — D485 Neoplasm of uncertain behavior of skin: Secondary | ICD-10-CM | POA: Diagnosis not present

## 2014-03-21 LAB — BASIC METABOLIC PANEL
BUN: 17 mg/dL (ref 6–23)
CO2: 30 mEq/L (ref 19–32)
Calcium: 9.2 mg/dL (ref 8.4–10.5)
Chloride: 96 mEq/L (ref 96–112)
Creatinine, Ser: 0.9 mg/dL (ref 0.4–1.5)
GFR: 86.19 mL/min (ref >60.00)
Glucose, Bld: 92 mg/dL (ref 70–99)
Potassium: 4.6 mEq/L (ref 3.5–5.1)
Sodium: 132 mEq/L — ABNORMAL LOW (ref 135–145)

## 2014-03-27 ENCOUNTER — Ambulatory Visit (INDEPENDENT_AMBULATORY_CARE_PROVIDER_SITE_OTHER)
Admission: RE | Admit: 2014-03-27 | Discharge: 2014-03-27 | Disposition: A | Discharge status: Home / Self Care | Payer: Medicare Other | Source: Ambulatory Visit | Attending: Internal Medicine | Admitting: Internal Medicine

## 2014-03-27 DIAGNOSIS — K7689 Other specified diseases of liver: Secondary | ICD-10-CM | POA: Diagnosis not present

## 2014-03-27 DIAGNOSIS — K769 Liver disease, unspecified: Secondary | ICD-10-CM

## 2014-03-29 DIAGNOSIS — Z85828 Personal history of other malignant neoplasm of skin: Secondary | ICD-10-CM | POA: Diagnosis not present

## 2014-03-29 DIAGNOSIS — C44319 Basal cell carcinoma of skin of other parts of face: Secondary | ICD-10-CM | POA: Diagnosis not present

## 2014-04-06 ENCOUNTER — Telehealth: Payer: Self-pay | Admitting: *Deleted

## 2014-04-06 NOTE — Telephone Encounter (Signed)
Patient is calling because when he went for an office visit to Dr Caryl Comes is systolic numbers were high.  He has been recording his blood pressure at home and this morning his blood pressure was 163/67.  His systolic numbers range 998-338 and his distolic numbers have been normal. He is taking lotensin 20 mg daily.  However, he gets his prescriptions from the [place] and they are lotensin 40 mg which he cuts in half. patient would like to know if he should start taking a whole tablet or if he should come in for an office visit. Please advise.

## 2014-04-08 NOTE — Telephone Encounter (Signed)
Increase Lotensin to 40 mg and office follow up within 2-3 weeks to reassess.

## 2014-04-09 NOTE — Telephone Encounter (Signed)
Left message on machine for patient to return our call 

## 2014-04-10 ENCOUNTER — Encounter: Payer: Self-pay | Admitting: Family Medicine

## 2014-04-10 NOTE — Telephone Encounter (Signed)
Patient responded by MyChart.  Message sent patient through [facility].

## 2014-04-17 ENCOUNTER — Telehealth: Payer: Self-pay | Admitting: Family Medicine

## 2014-04-17 ENCOUNTER — Ambulatory Visit (INDEPENDENT_AMBULATORY_CARE_PROVIDER_SITE_OTHER): Payer: Medicare Other | Admitting: Nurse Practitioner

## 2014-04-17 ENCOUNTER — Encounter: Payer: Self-pay | Admitting: Nurse Practitioner

## 2014-04-17 VITALS — BP 156/70 | HR 66 | Temp 98.2°F | Ht 68.0 in | Wt 184.0 lb

## 2014-04-17 DIAGNOSIS — M1990 Unspecified osteoarthritis, unspecified site: Secondary | ICD-10-CM | POA: Insufficient documentation

## 2014-04-17 DIAGNOSIS — I10 Essential (primary) hypertension: Secondary | ICD-10-CM | POA: Diagnosis not present

## 2014-04-17 DIAGNOSIS — G8929 Other chronic pain: Secondary | ICD-10-CM | POA: Insufficient documentation

## 2014-04-17 DIAGNOSIS — R1013 Epigastric pain: Secondary | ICD-10-CM | POA: Diagnosis not present

## 2014-04-17 MED ORDER — AMLODIPINE BESYLATE 5 MG PO TABS: 5.0000 mg | ORAL_TABLET | Freq: Every day | ORAL | Status: DC

## 2014-04-17 NOTE — Telephone Encounter (Signed)
I just sent this patient message via mychart. Can you please schedule the patient for a follow up B/P check.

## 2014-04-17 NOTE — Telephone Encounter (Signed)
Pt states he was going to leave for [place] today. Is it ok to work pt in at the end of day? pls advise on what pt should do.

## 2014-04-17 NOTE — Patient Instructions (Addendum)
Stop naproxen, as it may be raising blood pressure. Cut back alcohol or cut out. You may take 1000 mg tylenol twice daily.  Natural anti-inflammatories can be used as well: Fresh ginger tea daily (grate 1-2 TBLS fresh ginger in & steep in hot water for 5 minutes); handful tart cherries or 1/2 cup tart cherry juice daily; 2-3 curmarin capsules daily. Start 1 supplement at a time & take for 2 weeks before adding a second or third, if needed.  Take probiotic daily. Alternate brands daily :[place] are good brands.  Check blood pressure 3 times at home. Call me if over 150/90 or under 98/60.  When checking blood pressure, feet should be flat on floor, cuff at level of heart, sit for 5 minutes before taking reading.

## 2014-04-17 NOTE — Progress Notes (Signed)
Subjective:     [name] [name]. is a 73 y.o. male c/o elevated bp & epigastric pain. He is accompanied by his wife today. BP: PCP increased lisinopril to 40 mg qd from 20 mg about 10 days ago. Home readings range from 135-176/70. RF: NSAIDS, dyslipidemia. Pt feels well except for occasional epigastric pain that wakes early in am. Reviewed Dr Olin Pia note dated 03/12/14: he recommends BP be treated to goal of 975 systolic. Pt has pacemaker. Exercises regularly. Epigastric pain: Pt saw Dr Hilarie Fredrickson in last few mos, had upper endo revealing gastritis, imaging that revealed GB sludge. RF: Nsaids daily for 5 years, ETOH intake. Pt is taking high dose omeprazole. He has not started probiotic. Pain is worse after not having eaten in several hours.  The following portions of the patient's history were reviewed and updated as appropriate: allergies, current medications, past medical history, past social history, past surgical history and problem list.  Review of Systems Constitutional: negative for fevers and weight loss Ears, nose, mouth, throat, and face: negative for hoarseness, nasal congestion, sore mouth and voice change Respiratory: negative for cough Cardiovascular: negative for chest pain, chest pressure/discomfort, irregular heart beat and near-syncope Gastrointestinal: negative for change in bowel habits, constipation, diarrhea and nausea    Objective:    BP 156/70 mmHg  Pulse 66  Temp(Src) 98.2 F (36.8 C) (Temporal)  Ht 5\' 8"  (1.727 m)  Wt 184 lb (83.462 kg)  BMI 27.98 kg/m2  SpO2 100% BP 156/70 mmHg  Pulse 66  Temp(Src) 98.2 F (36.8 C) (Temporal)  Ht 5\' 8"  (1.727 m)  Wt 184 lb (83.462 kg)  BMI 27.98 kg/m2  SpO2 100% General appearance: alert, cooperative, appears stated age and no distress Head: Normocephalic, without obvious abnormality, atraumatic Eyes: negative findings: lids and lashes normal and conjunctivae and sclerae normal Lungs: clear to auscultation  bilaterally Heart: regular rate and rhythm, S1, S2 normal, no murmur, click, rub or gallop Abdomen: normal findings: no masses palpable and no organomegaly and abnormal findings:  LLQ tender to palpation    Assessment:Plan     1. Essential hypertension, benign Stop NSAIDS, ADD - amLODipine (NORVASC) 5 MG tablet; Take 1 tablet (5 mg total) by mouth daily.  Dispense: 30 tablet; Refill: 2 Check BP at home, F/u 8 to 10 weeks or PRN   2. Abdominal pain, chronic, epigastric Stop NSAIDS, cut back on ETOH intake Start probiotics  3 arthritis, OA, chronic Tylenol Natural anti-inflammatories

## 2014-04-17 NOTE — Progress Notes (Signed)
Pre visit review using our clinic review tool, if applicable. No additional management support is needed unless otherwise documented below in the visit note. 

## 2014-04-17 NOTE — Telephone Encounter (Signed)
Sch with [name] [name] at [facility] today. Patient is aware.

## 2014-04-17 NOTE — Telephone Encounter (Signed)
Pt states he was informed last week to double up on his bp medication due to bp running high.  Pt states he has doubled up on medication but his bp is still elevated.  He is leaving for [place] but wants to know if he should be seen before he leaves since bp is not improving

## 2014-04-19 ENCOUNTER — Telehealth: Payer: Self-pay | Admitting: Nurse Practitioner

## 2014-04-19 NOTE — Telephone Encounter (Signed)
pls call pt: Advise Daily consumption of alcoholic beverages will contribute to gastritis. So, along with stopping NSAIDS, he should cut back on alcoholic beverages for best management of gastritis.

## 2014-04-20 HISTORY — PX: COLONOSCOPY: SHX174

## 2014-04-30 NOTE — Telephone Encounter (Signed)
Unable to reach patient at his preferred contact number. Will try again later.

## 2014-05-23 NOTE — Telephone Encounter (Signed)
Patient notified

## 2014-06-12 LAB — MDC_IDC_ENUM_SESS_TYPE_REMOTE
Battery Remaining Longevity: 109 mo
Battery Remaining Percentage: 74 %
Battery Voltage: 2.95 V
Brady Statistic AP VP Percent: 1 %
Brady Statistic AP VS Percent: 1 %
Brady Statistic AS VP Percent: 1 %
Brady Statistic AS VS Percent: 99 %
Brady Statistic RA Percent Paced: 1 %
Brady Statistic RV Percent Paced: 1 %
Date Time Interrogation Session: 20160223130459
Implantable Pulse Generator Model: 2110
Implantable Pulse Generator Serial Number: 7156802
Lead Channel Impedance Value: 340 Ohm
Lead Channel Impedance Value: 350 Ohm
Lead Channel Pacing Threshold Amplitude: 0.875 V
Lead Channel Pacing Threshold Amplitude: 0.875 V
Lead Channel Pacing Threshold Pulse Width: 0.4 ms
Lead Channel Pacing Threshold Pulse Width: 0.4 ms
Lead Channel Sensing Intrinsic Amplitude: 4.1 mV
Lead Channel Sensing Intrinsic Amplitude: 8.5 mV
Lead Channel Setting Pacing Amplitude: 1.125
Lead Channel Setting Pacing Amplitude: 1.875
Lead Channel Setting Pacing Pulse Width: 0.4 ms
Lead Channel Setting Sensing Sensitivity: 2 mV

## 2014-06-13 ENCOUNTER — Encounter: Payer: Self-pay | Admitting: Internal Medicine

## 2014-06-13 ENCOUNTER — Encounter: Payer: Medicare Other | Admitting: *Deleted

## 2014-06-25 ENCOUNTER — Ambulatory Visit (INDEPENDENT_AMBULATORY_CARE_PROVIDER_SITE_OTHER): Payer: Medicare Other | Admitting: *Deleted

## 2014-06-25 DIAGNOSIS — I495 Sick sinus syndrome: Secondary | ICD-10-CM | POA: Diagnosis not present

## 2014-06-25 NOTE — Progress Notes (Signed)
Remote pacemaker transmission.   

## 2014-06-29 ENCOUNTER — Other Ambulatory Visit: Payer: Self-pay

## 2014-06-29 ENCOUNTER — Telehealth: Payer: Self-pay | Admitting: Internal Medicine

## 2014-06-29 DIAGNOSIS — R1084 Generalized abdominal pain: Secondary | ICD-10-CM

## 2014-06-29 NOTE — Telephone Encounter (Signed)
Pt scheduled for HIDA scan at [facility] 07/23/14@9 :30am. Pt to arrive there at 9:15am. Pt to be NPO after midnight. Pt aware of appt. Pt wants to do Hida and schedule colon later.

## 2014-06-29 NOTE — Telephone Encounter (Signed)
Pt states he is still having abdominal pain a little above his belly button in the middle of his abdomen. Pt states he is due for colon in April but wonders if he should go ahead and have colon done now. Pt also wanted to know if perhaps a hida scan might show something regarding his pain. Please advise.

## 2014-06-29 NOTE — Telephone Encounter (Signed)
Reading back in the chart  HIDA with [facility] was recommended, but he was going to be out of town and was to call back.  It can be ordered now Repeat colonoscopy for polyp surveillance is also due this May 2016.  It can also be scheduled now Thanks

## 2014-07-05 ENCOUNTER — Encounter: Payer: Self-pay | Admitting: Internal Medicine

## 2014-07-19 ENCOUNTER — Encounter: Payer: Self-pay | Admitting: Internal Medicine

## 2014-07-22 ENCOUNTER — Encounter: Payer: Self-pay | Admitting: Internal Medicine

## 2014-07-23 ENCOUNTER — Encounter (HOSPITAL_COMMUNITY)
Admission: RE | Admit: 2014-07-23 | Discharge: 2014-07-23 | Disposition: A | Discharge status: Home / Self Care | Payer: Medicare Other | Source: Ambulatory Visit | Attending: Internal Medicine | Admitting: Internal Medicine

## 2014-07-23 DIAGNOSIS — K828 Other specified diseases of gallbladder: Secondary | ICD-10-CM | POA: Diagnosis not present

## 2014-07-23 DIAGNOSIS — R1084 Generalized abdominal pain: Secondary | ICD-10-CM | POA: Insufficient documentation

## 2014-07-23 MED ORDER — TECHNETIUM TC 99M MEBROFENIN IV KIT
5.3000 | PACK | Freq: Once | INTRAVENOUS | Status: AC | PRN
  Administered 2014-07-23: 5 via INTRAVENOUS

## 2014-07-23 MED ORDER — SINCALIDE 5 MCG IJ SOLR
0.0200 ug/kg | Freq: Once | INTRAMUSCULAR | Status: AC
  Administered 2014-07-23: 1.7 ug via INTRAVENOUS

## 2014-07-24 ENCOUNTER — Encounter: Payer: Self-pay | Admitting: Cardiology

## 2014-07-31 ENCOUNTER — Ambulatory Visit (INDEPENDENT_AMBULATORY_CARE_PROVIDER_SITE_OTHER): Payer: Medicare Other | Admitting: Nurse Practitioner

## 2014-07-31 ENCOUNTER — Encounter: Payer: Self-pay | Admitting: Nurse Practitioner

## 2014-07-31 VITALS — BP 127/74 | HR 57 | Temp 97.7°F | Ht 68.0 in | Wt 180.0 lb

## 2014-07-31 DIAGNOSIS — H6123 Impacted cerumen, bilateral: Secondary | ICD-10-CM

## 2014-07-31 DIAGNOSIS — H6120 Impacted cerumen, unspecified ear: Secondary | ICD-10-CM | POA: Insufficient documentation

## 2014-07-31 NOTE — Progress Notes (Signed)
Subjective:     [name] [name] is a 75 y.o. male who complains of hearing loss. Onset of symptoms was sudden, beginning approximately 1 week ago. Symptoms have been stable since that time. Symptoms include: hearing loss involving both ears. Patient wears hearing aids. Had new aids fitted 1 week ago. He feels that R hearing aid does not go in ear as far as it should. He has been using ear wax removal for several days without relief. Denies ear pain, but ear feels itchy.  The following portions of the patient's history were reviewed and updated as appropriate: allergies, current medications, past medical history, past social history, past surgical history and problem list.  Review of Systems Pertinent items are noted in HPI.    Objective:    BP 127/74 mmHg  Pulse 57  Temp(Src) 97.7 F (36.5 C) (Oral)  Ht 5\' 8"  (1.727 m)  Wt 180 lb (81.647 kg)  BMI 27.38 kg/m2  SpO2 98% General:  alert, cooperative, appears stated age and no distress  Right Ear: canal filled with wax, unable to visualize TM  Left Ear: canal filled with wax, unable to visualize TM  Post lavage: Bilat TMs are opaque. Canals not traumatic. Mild erythema & maceration of canal.  Assessment:Plan  1. Cerumen impaction, bilateral Successful lavage bilat ears. Lavage removed plastic component to hearing aid from R ear  F/u PRN

## 2014-07-31 NOTE — Progress Notes (Signed)
Pre visit review using our clinic review tool, if applicable. No additional management support is needed unless otherwise documented below in the visit note. 

## 2014-07-31 NOTE — Patient Instructions (Signed)
To keep ears clear, use syringe to flush ears with 1:1 solution of warm water & hydrogen peroxide 2-3 times weekly when getting into shower.  Cerumen Impaction A cerumen impaction is when the wax in your ear forms a plug. This plug usually causes reduced hearing. Sometimes it also causes an earache or dizziness. Removing a cerumen impaction can be difficult and painful. The wax sticks to the ear canal. The canal is sensitive and bleeds easily. If you try to remove a heavy wax buildup with a cotton tipped swab, you may push it in further. Irrigation with water, suction, and small ear curettes may be used to clear out the wax. If the impaction is fixed to the skin in the ear canal, ear drops may be needed for a few days to loosen the wax. People who build up a lot of wax frequently can use ear wax removal products available in your local drugstore. SEEK MEDICAL CARE IF:  You develop an earache, increased hearing loss, or marked dizziness. Document Released: 05/14/2004 Document Revised: 06/29/2011 Document Reviewed: 07/04/2009 [facility] Patient Information 2014 [place], [place].

## 2014-08-01 ENCOUNTER — Encounter: Payer: Self-pay | Admitting: Internal Medicine

## 2014-08-24 ENCOUNTER — Encounter: Payer: Medicare Other | Admitting: Internal Medicine

## 2014-09-05 ENCOUNTER — Ambulatory Visit (AMBULATORY_SURGERY_CENTER): Payer: Self-pay | Admitting: *Deleted

## 2014-09-05 VITALS — Ht 67.5 in | Wt 177.8 lb

## 2014-09-05 DIAGNOSIS — Z86010 Personal history of colonic polyps: Secondary | ICD-10-CM

## 2014-09-05 MED ORDER — NA SULFATE-K SULFATE-MG SULF 17.5-3.13-1.6 GM/177ML PO SOLN: 1.0000 | Freq: Once | ORAL | Status: DC

## 2014-09-05 NOTE — Progress Notes (Signed)
No issues with past sedation  No egg/soy allergy  No diet pills   emmi to e mail

## 2014-09-13 ENCOUNTER — Encounter: Payer: Self-pay | Admitting: Internal Medicine

## 2014-09-19 ENCOUNTER — Ambulatory Visit (AMBULATORY_SURGERY_CENTER): Payer: Medicare Other | Admitting: Internal Medicine

## 2014-09-19 ENCOUNTER — Encounter: Payer: Medicare Other | Admitting: Internal Medicine

## 2014-09-19 ENCOUNTER — Encounter: Payer: Self-pay | Admitting: Internal Medicine

## 2014-09-19 VITALS — BP 140/76 | HR 57 | Temp 97.0°F | Resp 36 | Ht 67.0 in | Wt 177.8 lb

## 2014-09-19 DIAGNOSIS — D123 Benign neoplasm of transverse colon: Secondary | ICD-10-CM | POA: Diagnosis not present

## 2014-09-19 DIAGNOSIS — Z86010 Personal history of colonic polyps: Secondary | ICD-10-CM | POA: Diagnosis not present

## 2014-09-19 DIAGNOSIS — D122 Benign neoplasm of ascending colon: Secondary | ICD-10-CM

## 2014-09-19 DIAGNOSIS — G4730 Sleep apnea, unspecified: Secondary | ICD-10-CM | POA: Diagnosis not present

## 2014-09-19 DIAGNOSIS — D126 Benign neoplasm of colon, unspecified: Secondary | ICD-10-CM

## 2014-09-19 MED ORDER — SUCRALFATE 1 G PO TABS: 1.0000 g | ORAL_TABLET | Freq: Three times a day (TID) | ORAL | Status: DC

## 2014-09-19 MED ORDER — SODIUM CHLORIDE 0.9 % IV SOLN: 500.0000 mL | INTRAVENOUS | Status: DC

## 2014-09-19 NOTE — Op Note (Signed)
[place]  [employer] [address]. [place], [address]   COLONOSCOPY PROCEDURE REPORT  PATIENT: [name], [name]  MR#: [id number] BIRTHDATE: 08-23-1940 , 73  yrs. old GENDER: male ENDOSCOPIST: Jerene Bears, MD PROCEDURE DATE:  09/19/2014 PROCEDURE:   Colonoscopy, surveillance , Colonoscopy with snare polypectomy, and Colonoscopy with cold biopsy polypectomy First Screening Colonoscopy - Avg.  risk and is 50 yrs.  old or older - No.  Prior Negative Screening - Now for repeat screening. N/A  History of Adenoma - Now for follow-up colonoscopy & has been > or = to 3 yrs.  Yes hx of adenoma.  Has been 3 or more years since last colonoscopy.  Polyps removed today? Yes ASA CLASS:   Class III INDICATIONS:Surveillance due to prior colonic neoplasia and PH Colon Adenoma. MEDICATIONS: Monitored anesthesia care and Propofol 300 mg IV  DESCRIPTION OF PROCEDURE:   After the risks benefits and alternatives of the procedure were thoroughly explained, informed consent was obtained.  The digital rectal exam revealed no rectal mass.   The LB [id number]  endoscope was introduced through the anus and advanced to the cecum, which was identified by both the appendix and ileocecal valve. No adverse events experienced. The quality of the prep was good.  (Suprep was used)  The instrument was then slowly withdrawn as the colon was fully examined. Estimated blood loss is zero unless otherwise noted in this procedure report.   COLON FINDINGS: Five sessile polyps ranging from 2 to 70mm in size were found in the ascending colon (2), transverse colon (2), and at the splenic flexure (1).  Polypectomies were performed with cold forceps (2) and with a cold snare (3).  The resection was complete, the polyp tissue was completely retrieved and sent to histology. There was mild diverticulosis noted in the left colon.  Retroflexed views revealed no abnormalities. The time to cecum = 3.4 Withdrawal time  = 16.0   The scope was withdrawn and the procedure completed. COMPLICATIONS: There were no immediate complications.  ENDOSCOPIC IMPRESSION: 1.   Five sessile polyps ranging from 2 to 53mm in size were found in the ascending colon, transverse colon, and at the splenic flexure; polypectomies were performed with cold forceps and with a cold snare 2.   Mild diverticulosis was noted in the left colon  RECOMMENDATIONS: 1.  Await pathology results 2.  High fiber diet 3.  Timing of repeat colonoscopy will be determined by pathology findings. 4.  You will receive a letter within 1-2 weeks with the results of your biopsy as well as final recommendations.  Please call my office if you have not received a letter after 3 weeks.  eSigned:  Jerene Bears, MD 09/19/2014 8:35 AM   cc: Carolann Littler, MD and The Patient   PATIENT NAME:  [name] [name], [name] [name] MR#: [id number]

## 2014-09-19 NOTE — Progress Notes (Signed)
Called to room to assist during endoscopic procedure.  Patient ID and intended procedure confirmed with present staff. Received instructions for my participation in the procedure from the performing physician.  

## 2014-09-19 NOTE — Patient Instructions (Signed)
YOU HAD AN ENDOSCOPIC PROCEDURE TODAY AT [facility]:   Refer to the procedure report that was given to you for any specific questions about what was found during the examination.  If the procedure report does not answer your questions, please call your gastroenterologist to clarify.  If you requested that your care partner not be given the details of your procedure findings, then the procedure report has been included in a sealed envelope for you to review at your convenience later.  YOU SHOULD EXPECT: Some feelings of bloating in the abdomen. Passage of more gas than usual.  Walking can help get rid of the air that was put into your GI tract during the procedure and reduce the bloating. If you had a lower endoscopy (such as a colonoscopy or flexible sigmoidoscopy) you may notice spotting of blood in your stool or on the toilet paper. If you underwent a bowel prep for your procedure, you may not have a normal bowel movement for a few days.  Please Note:  You might notice some irritation and congestion in your nose or some drainage.  This is from the oxygen used during your procedure.  There is no need for concern and it should clear up in a day or so.  SYMPTOMS TO REPORT IMMEDIATELY:   Following lower endoscopy (colonoscopy or flexible sigmoidoscopy):  Excessive amounts of blood in the stool  Significant tenderness or worsening of abdominal pains  Swelling of the abdomen that is new, acute  Fever of 100F or higher    For urgent or emergent issues, a gastroenterologist can be reached at any hour by calling [phone].   DIET: Your first meal following the procedure should be a small meal and then it is ok to progress to your normal diet. Heavy or fried foods are harder to digest and may make you feel nauseous or bloated.  Likewise, meals heavy in dairy and vegetables can increase bloating.  Drink plenty of fluids but you should avoid alcoholic beverages for 24  hours.  ACTIVITY:  You should plan to take it easy for the rest of today and you should NOT DRIVE or use heavy machinery until tomorrow (because of the sedation medicines used during the test).    FOLLOW UP: Our staff will call the number listed on your records the next business day following your procedure to check on you and address any questions or concerns that you may have regarding the information given to you following your procedure. If we do not reach you, we will leave a message.  However, if you are feeling well and you are not experiencing any problems, there is no need to return our call.  We will assume that you have returned to your regular daily activities without incident.  If any biopsies were taken you will be contacted by phone or by letter within the next 1-3 weeks.  Please call us at [phone] if you have not heard about the biopsies in 3 weeks.    SIGNATURES/CONFIDENTIALITY: You and/or your care partner have signed paperwork which will be entered into your electronic medical record.  These signatures attest to the fact that that the information above on your After Visit Summary has been reviewed and is understood.  Full responsibility of the confidentiality of this discharge information lies with you and/or your care-partner.   INFORMATION ON POLYPS,DIVERTICULOSIS, &HIGH FIBER DIET GIVEN TO YOU TODAY

## 2014-09-19 NOTE — Progress Notes (Signed)
A/ox3, pleased with MAC, report to RN 

## 2014-09-20 ENCOUNTER — Telehealth: Payer: Self-pay | Admitting: Emergency Medicine

## 2014-09-20 DIAGNOSIS — L570 Actinic keratosis: Secondary | ICD-10-CM | POA: Diagnosis not present

## 2014-09-20 DIAGNOSIS — Z85828 Personal history of other malignant neoplasm of skin: Secondary | ICD-10-CM | POA: Diagnosis not present

## 2014-09-20 DIAGNOSIS — D485 Neoplasm of uncertain behavior of skin: Secondary | ICD-10-CM | POA: Diagnosis not present

## 2014-09-20 DIAGNOSIS — L821 Other seborrheic keratosis: Secondary | ICD-10-CM | POA: Diagnosis not present

## 2014-09-20 NOTE — Telephone Encounter (Signed)
  Follow up Call-  Call back number 09/19/2014 02/05/2014  Post procedure Call Back phone  # [phone]  Permission to leave phone message Yes Yes     Patient questions:  Do you have a fever, pain , or abdominal swelling? No. Pain Score  0 *  Have you tolerated food without any problems? Yes.    Have you been able to return to your normal activities? No.  Do you have any questions about your discharge instructions: Diet   No. Medications  No. Follow up visit  No.  Do you have questions or concerns about your Care? No.  Actions: * If pain score is 4 or above: No action needed, pain <4.

## 2014-09-24 ENCOUNTER — Ambulatory Visit (INDEPENDENT_AMBULATORY_CARE_PROVIDER_SITE_OTHER): Payer: Medicare Other | Admitting: *Deleted

## 2014-09-24 ENCOUNTER — Telehealth: Payer: Self-pay | Admitting: Cardiology

## 2014-09-24 DIAGNOSIS — I495 Sick sinus syndrome: Secondary | ICD-10-CM

## 2014-09-24 NOTE — Telephone Encounter (Signed)
Confirmed remote transmission w/ pt wife.   

## 2014-09-25 NOTE — Progress Notes (Signed)
Remote pacemaker transmission.   

## 2014-09-29 LAB — CUP PACEART REMOTE DEVICE CHECK
Battery Remaining Longevity: 107 mo
Battery Remaining Percentage: 74 %
Battery Voltage: 2.95 V
Brady Statistic AP VP Percent: 1 %
Brady Statistic AP VS Percent: 1 %
Brady Statistic AS VP Percent: 1 %
Brady Statistic AS VS Percent: 99 %
Brady Statistic RA Percent Paced: 1 %
Brady Statistic RV Percent Paced: 1 %
Date Time Interrogation Session: 20160606221830
Lead Channel Impedance Value: 350 Ohm
Lead Channel Impedance Value: 390 Ohm
Lead Channel Pacing Threshold Amplitude: 0.875 V
Lead Channel Pacing Threshold Amplitude: 0.875 V
Lead Channel Pacing Threshold Pulse Width: 0.4 ms
Lead Channel Pacing Threshold Pulse Width: 0.4 ms
Lead Channel Sensing Intrinsic Amplitude: 3.8 mV
Lead Channel Sensing Intrinsic Amplitude: 8.7 mV
Lead Channel Setting Pacing Amplitude: 1.125
Lead Channel Setting Pacing Amplitude: 1.875
Lead Channel Setting Pacing Pulse Width: 0.4 ms
Lead Channel Setting Sensing Sensitivity: 2 mV
Pulse Gen Model: 2110
Pulse Gen Serial Number: 7156802

## 2014-10-01 ENCOUNTER — Encounter: Payer: Self-pay | Admitting: Internal Medicine

## 2014-10-04 ENCOUNTER — Telehealth: Payer: Self-pay | Admitting: Internal Medicine

## 2014-10-04 NOTE — Telephone Encounter (Signed)
New problem    Pt want to know if his remote check was received.

## 2014-10-04 NOTE — Telephone Encounter (Signed)
LMOVM for pt informing him that his transmission was received on 09-24-14.

## 2014-10-10 ENCOUNTER — Encounter: Payer: Self-pay | Admitting: Cardiology

## 2014-10-15 ENCOUNTER — Other Ambulatory Visit: Payer: Self-pay

## 2014-10-23 ENCOUNTER — Encounter: Payer: Self-pay | Admitting: Internal Medicine

## 2014-11-20 ENCOUNTER — Telehealth: Payer: Self-pay | Admitting: Internal Medicine

## 2014-11-20 MED ORDER — SUCRALFATE 1 G PO TABS: 1.0000 g | ORAL_TABLET | Freq: Three times a day (TID) | ORAL | Status: DC

## 2014-11-20 NOTE — Telephone Encounter (Signed)
Ok to refill 

## 2014-11-20 NOTE — Telephone Encounter (Signed)
Left message for patient to call back  

## 2014-11-20 NOTE — Telephone Encounter (Signed)
Dr Hilarie Fredrickson, should I continue to refill?

## 2014-11-21 NOTE — Telephone Encounter (Signed)
Patient is advised to come for rx. He verbalizes understanding.

## 2015-01-04 ENCOUNTER — Ambulatory Visit (INDEPENDENT_AMBULATORY_CARE_PROVIDER_SITE_OTHER): Payer: Medicare Other | Admitting: Family Medicine

## 2015-01-04 ENCOUNTER — Encounter: Payer: Self-pay | Admitting: Family Medicine

## 2015-01-04 VITALS — BP 130/80 | HR 60 | Temp 97.9°F | Ht 67.0 in | Wt 179.0 lb

## 2015-01-04 DIAGNOSIS — I10 Essential (primary) hypertension: Secondary | ICD-10-CM

## 2015-01-04 DIAGNOSIS — Z0000 Encounter for general adult medical examination without abnormal findings: Secondary | ICD-10-CM | POA: Diagnosis not present

## 2015-01-04 DIAGNOSIS — Z23 Encounter for immunization: Secondary | ICD-10-CM

## 2015-01-04 DIAGNOSIS — K219 Gastro-esophageal reflux disease without esophagitis: Secondary | ICD-10-CM | POA: Diagnosis not present

## 2015-01-04 NOTE — Patient Instructions (Signed)
YOU WILL NEED TETANUS BOOSTER IN 3 YEARS OTHER IMMUNIZATIONS ARE UP TO DATE.

## 2015-01-04 NOTE — Progress Notes (Signed)
Pre visit review using our clinic review tool, if applicable. No additional management support is needed unless otherwise documented below in the visit note. 

## 2015-01-04 NOTE — Progress Notes (Signed)
Subjective:    Patient ID: [name] [name], male    DOB: Sep 23, 1940, 74 y.o.   MRN: [id number]  HPI   Patient seen for Medicare wellness exam and medical follow-up. He has history of hypertension, history of recurrent syncope with sinus node dysfunction currently stable with pacemaker. Had some chronic epigastric pain and last year had extensive GI workup which was unrevealing. He does have some diverticulosis had several benign colon polyps for colonoscopy last year. He has chronic hearing loss. Medications reviewed. His blood pressure has been stable on benazepril. He no longer takes amlodipine.  He's had some shoulder pains right shoulder for about 6 months intermittently. Pain is minimal. No weakness. No radiculopathy symptoms. Occasional left hip pains past few months. No pain with ambulation. Pain is mostly anterior. No low back pain. No numbness or weakness.  Past Medical History  Diagnosis Date  . HYPERTENSION 07/17/2008  . SYNCOPE 07/24/2008  . CHEST WALL PAIN, ACUTE 10/11/2009  . Sinus node dysfunction 01/27/2011  . 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 GI problem   . Sleep apnea     doesnt wear cpap  . HYPERLIPIDEMIA 07/17/2008    on medicine  . GERD (gastroesophageal reflux disease)    Past Surgical History  Procedure Laterality Date  . Pacemaker placement      St Jude loop implantation 2010  . Colonoscopy    . Polypectomy      reports that he quit smoking about 32 years ago. His smoking use included Cigarettes. He has a 20 pack-year smoking history. He has never used smokeless tobacco. He reports that he drinks about 7.0 oz of alcohol per week. He reports that he does not use illicit drugs. family history includes Alcohol abuse in his maternal aunt; Colitis in his father; Hypertension in his father and mother; Other in his father; Stroke in his mother. There is no history of Colon cancer, Rectal cancer, Stomach cancer, or Esophageal cancer. No Known Allergies  1.  Risk factors  based on Past Medical , Social, and Family history reviewed and as indicated above with no changes 2.  Limitations in physical activities None.  No recent falls. 3.  Depression/mood No active depression or anxiety issues 4.  Hearing bilateral hearing aids 5.  ADLs independent in all. 6.  Cognitive function (orientation to time and place, language, writing, speech,memory) no short or long term memory issues.  Language and judgement intact. 7.  Home Safety no issues 8.  Height, weight, and visual acuity.all stable. 9.  Counseling discussed ongoing regular exercise 10. Recommendation of preventive services. Flu vaccine 11. Labs based on risk factors- none.  [facility] and these were reviewed.   12. Care Plan as below 13. Other Providers-Cardiology Dr Caryl Comes,  GI-Dr Pyrtle. 14. Written schedule of screening/prevention services given to patient.    Review of Systems  Constitutional: Negative for fever, activity change, appetite change and fatigue.  HENT: Negative for congestion, ear pain and trouble swallowing.   Eyes: Negative for pain and visual disturbance.  Respiratory: Negative for cough, shortness of breath and wheezing.   Cardiovascular: Negative for chest pain and palpitations.  Gastrointestinal: Negative for nausea, vomiting, abdominal pain, diarrhea, constipation, blood in stool, abdominal distention and rectal pain.  Genitourinary: Negative for dysuria, hematuria and testicular pain.  Musculoskeletal: Negative for joint swelling and arthralgias.  Skin: Negative for rash.  Neurological: Negative for dizziness, syncope and headaches.  Hematological: Negative for adenopathy.  Psychiatric/Behavioral: Negative for confusion  and dysphoric mood.       Objective:   Physical Exam  Constitutional: He is oriented to person, place, and time. He appears well-developed and well-nourished. No distress.  HENT:  Head: Normocephalic and atraumatic.  Right Ear:  External ear normal.  Left Ear: External ear normal.  Mouth/Throat: Oropharynx is clear and moist.  Eyes: Conjunctivae and EOM are normal. Pupils are equal, round, and reactive to light.  Neck: Normal range of motion. Neck supple. No thyromegaly present.  Cardiovascular: Normal rate, regular rhythm and normal heart sounds.   No murmur heard. Pulmonary/Chest: No respiratory distress. He has no wheezes. He has no rales.  Abdominal: Soft. Bowel sounds are normal. He exhibits no distension and no mass. There is no tenderness. There is no rebound and no guarding.  Musculoskeletal: He exhibits no edema.  Lymphadenopathy:    He has no cervical adenopathy.  Neurological: He is alert and oriented to person, place, and time. He displays normal reflexes. No cranial nerve deficit.  Skin: No rash noted.  Psychiatric: He has a normal mood and affect.          Assessment & Plan:  #1 health maintenance. Flu vaccine given. Other immunizations up-to-date. Recent colonoscopy as above #2 hypertension stable and at goal. #3 history of GERD stable. Currently on sucralfate. He quit taking omeprazole secondary to concerns reports linking possible Alzheimer's.

## 2015-01-06 DIAGNOSIS — K219 Gastro-esophageal reflux disease without esophagitis: Secondary | ICD-10-CM | POA: Insufficient documentation

## 2015-03-12 ENCOUNTER — Encounter: Payer: Medicare Other | Admitting: Internal Medicine

## 2015-03-26 DIAGNOSIS — L570 Actinic keratosis: Secondary | ICD-10-CM | POA: Diagnosis not present

## 2015-03-26 DIAGNOSIS — D225 Melanocytic nevi of trunk: Secondary | ICD-10-CM | POA: Diagnosis not present

## 2015-03-26 DIAGNOSIS — D692 Other nonthrombocytopenic purpura: Secondary | ICD-10-CM | POA: Diagnosis not present

## 2015-03-26 DIAGNOSIS — C44311 Basal cell carcinoma of skin of nose: Secondary | ICD-10-CM | POA: Diagnosis not present

## 2015-03-26 DIAGNOSIS — L821 Other seborrheic keratosis: Secondary | ICD-10-CM | POA: Diagnosis not present

## 2015-03-26 DIAGNOSIS — Z85828 Personal history of other malignant neoplasm of skin: Secondary | ICD-10-CM | POA: Diagnosis not present

## 2015-03-26 DIAGNOSIS — D0462 Carcinoma in situ of skin of left upper limb, including shoulder: Secondary | ICD-10-CM | POA: Diagnosis not present

## 2015-03-26 DIAGNOSIS — D485 Neoplasm of uncertain behavior of skin: Secondary | ICD-10-CM | POA: Diagnosis not present

## 2015-04-02 ENCOUNTER — Ambulatory Visit (INDEPENDENT_AMBULATORY_CARE_PROVIDER_SITE_OTHER): Payer: Medicare Other | Admitting: Internal Medicine

## 2015-04-02 ENCOUNTER — Encounter: Payer: Self-pay | Admitting: Internal Medicine

## 2015-04-02 VITALS — BP 150/82 | HR 53 | Ht 67.5 in | Wt 178.4 lb

## 2015-04-02 DIAGNOSIS — I495 Sick sinus syndrome: Secondary | ICD-10-CM | POA: Diagnosis not present

## 2015-04-02 DIAGNOSIS — Z950 Presence of cardiac pacemaker: Secondary | ICD-10-CM | POA: Diagnosis not present

## 2015-04-02 MED ORDER — LISINOPRIL-HYDROCHLOROTHIAZIDE 20-12.5 MG PO TABS: 1.0000 | ORAL_TABLET | Freq: Every day | ORAL | Status: DC

## 2015-04-02 NOTE — Patient Instructions (Signed)
Medication Instructions: 1) Stop benazepril 2) Start lisinopril/ hctz 20/12.5 mg one tablet by mouth once daily  Labwork: - Your physician recommends that you return for lab work Tuesday 04/16/15: BMP (lab is open from 7:30 am - 5 pm)   Procedures/Testing: - none  Follow-Up: - Remote monitoring is used to monitor your Pacemaker of ICD from home. This monitoring reduces the number of office visits required to check your device to one time per year. It allows [place] to keep an eye on the functioning of your device to ensure it is working properly. You are scheduled for a device check from home on 07/02/15. You may send your transmission at any time that day. If you have a wireless device, the transmission will be sent automatically. After your physician reviews your transmission, you will receive a postcard with your next transmission date.  - Your physician wants you to follow-up in: 1 year with Dr. Caryl Comes. You will receive a reminder letter in the mail two months in advance. If you don't receive a letter, please call our office to schedule the follow-up appointment.  Any Additional Special Instructions Will Be Listed Below (If Applicable).

## 2015-04-02 NOTE — Progress Notes (Signed)
      Patient Care Team: [name], MD as PCP - General   HPI  [name] [name] is a 74 y.o. male Seen in followup for recurrent syncope associated with demonstrated sinus node dysfunction by implantable loop recorder. He is status post pacemaker implantation 10/11    No recurrent syncope. No complaints of chest pain or shortness of breath  His blood pressure home runs in the 150 range or so. Past Medical History  Diagnosis Date  . HYPERTENSION 07/17/2008  . SYNCOPE 07/24/2008  . CHEST WALL PAIN, ACUTE 10/11/2009  . Sinus node dysfunction ([place]) 01/27/2011  . 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 GI problem   . Sleep apnea     doesnt wear cpap  . HYPERLIPIDEMIA 07/17/2008    on medicine  . GERD (gastroesophageal reflux disease)     Past Surgical History  Procedure Laterality Date  . Pacemaker placement      St Jude loop implantation 2010  . Colonoscopy    . Polypectomy      Current Outpatient Prescriptions  Medication Sig Dispense Refill  . benazepril (LOTENSIN) 20 MG tablet Take 20 mg by mouth 2 (two) times daily.    . Coenzyme Q10-Vitamin E 100-300 MG-UNIT WAFR Take 300 mg by mouth daily.     . Glucosamine-Chondroit-Vit C-Mn (GLUCOSAMINE 1500 COMPLEX) CAPS Take 1 capsule by mouth daily.      . Misc Natural Products (PROSTATE HEALTH) CAPS Take 1 capsule by mouth.      . Multiple Vitamin (MULTIVITAMIN PO) Take 1 tablet by mouth daily.      . Omega-3 Fatty Acids (FISH OIL) 1000 MG CAPS Take 1 capsule by mouth daily.      . rosuvastatin (CRESTOR) 5 MG tablet Take 2.5 mg by mouth daily.     . sucralfate (CARAFATE) 1 G tablet Take 1 tablet (1 g total) by mouth 4 (four) times daily -  with meals and at bedtime. 360 tablet 0  . vitamin C (ASCORBIC ACID) 500 MG tablet Take 500 mg by mouth daily.       No current facility-administered medications for this visit.    No Known Allergies  Review of Systems negative except from HPI and PMH  Physical Exam BP 150/82 mmHg  Pulse  53  Ht 5' 7.5" (1.715 m)  Wt 178 lb 6.4 oz (80.922 kg)  BMI 27.51 kg/m2 Well developed and well nourished in no acute distress HENT normal E scleral and icterus clear Neck Supple JVP flat; carotids brisk and full Clear to ausculation  Device pocket well healed; without hematoma or erythema.  There is no tethering Regular rate and rhythm, no murmurs gallops or rub Soft with active bowel sounds No clubbing cyanosis none Edema Alert and oriented, grossly normal motor and sensory function Skin Warm and Dry  ECG is sinus at 53 Intervals 18/10/41 Otherwise normal  Assessment and  Plan  Sinus node dysfunction  Syncope  Pacemaker-St. Jude The patient's device was interrogated.  The information was reviewed. No changes were made in the programming.    Hypertension  BP is not at goal We will switch him lisinopril and add HCTZ for BP control Chlorthalidone is a cost concert  He remains on low dose statin Rx His LDL 5/15 122 with HDL 62  No syncope

## 2015-04-03 LAB — CUP PACEART INCLINIC DEVICE CHECK
Battery Remaining Longevity: 138
Battery Voltage: 2.95 V
Brady Statistic RA Percent Paced: 0.83 %
Brady Statistic RV Percent Paced: 0.07 %
Date Time Interrogation Session: 20161213214611
Implantable Lead Implant Date: 20111010
Implantable Lead Implant Date: 20111010
Implantable Lead Location: 753859
Implantable Lead Location: 753860
Lead Channel Impedance Value: 350 Ohm
Lead Channel Impedance Value: 387.5 Ohm
Lead Channel Pacing Threshold Amplitude: 1 V
Lead Channel Pacing Threshold Amplitude: 1 V
Lead Channel Pacing Threshold Amplitude: 1 V
Lead Channel Pacing Threshold Amplitude: 1 V
Lead Channel Pacing Threshold Pulse Width: 0.4 ms
Lead Channel Pacing Threshold Pulse Width: 0.4 ms
Lead Channel Pacing Threshold Pulse Width: 0.4 ms
Lead Channel Pacing Threshold Pulse Width: 0.4 ms
Lead Channel Sensing Intrinsic Amplitude: 3.7 mV
Lead Channel Sensing Intrinsic Amplitude: 8.4 mV
Lead Channel Setting Pacing Amplitude: 1 V
Lead Channel Setting Pacing Amplitude: 1.75 V
Lead Channel Setting Pacing Pulse Width: 0.4 ms
Lead Channel Setting Sensing Sensitivity: 2 mV
Pulse Gen Model: 2110
Pulse Gen Serial Number: 7156802

## 2015-04-09 DIAGNOSIS — C44311 Basal cell carcinoma of skin of nose: Secondary | ICD-10-CM | POA: Diagnosis not present

## 2015-04-09 DIAGNOSIS — Z85828 Personal history of other malignant neoplasm of skin: Secondary | ICD-10-CM | POA: Diagnosis not present

## 2015-04-16 ENCOUNTER — Other Ambulatory Visit (INDEPENDENT_AMBULATORY_CARE_PROVIDER_SITE_OTHER): Payer: Medicare Other | Admitting: *Deleted

## 2015-04-16 DIAGNOSIS — I495 Sick sinus syndrome: Secondary | ICD-10-CM

## 2015-04-16 LAB — BASIC METABOLIC PANEL
BUN: 14 mg/dL (ref 7–25)
CO2: 28 mmol/L (ref 20–31)
Calcium: 9.5 mg/dL (ref 8.6–10.3)
Chloride: 94 mmol/L — ABNORMAL LOW (ref 98–110)
Creat: 0.84 mg/dL (ref 0.70–1.18)
Glucose, Bld: 94 mg/dL (ref 65–99)
Potassium: 4.5 mmol/L (ref 3.5–5.3)
Sodium: 132 mmol/L — ABNORMAL LOW (ref 135–146)

## 2015-04-16 NOTE — Addendum Note (Signed)
Addended by: Embry Huss K on: 04/16/2015 07:37 AM   Modules accepted: Orders  

## 2015-07-02 ENCOUNTER — Telehealth: Payer: Self-pay | Admitting: Internal Medicine

## 2015-07-02 ENCOUNTER — Ambulatory Visit (INDEPENDENT_AMBULATORY_CARE_PROVIDER_SITE_OTHER): Payer: Medicare Other | Admitting: *Deleted

## 2015-07-02 DIAGNOSIS — I495 Sick sinus syndrome: Secondary | ICD-10-CM

## 2015-07-02 DIAGNOSIS — Z950 Presence of cardiac pacemaker: Secondary | ICD-10-CM

## 2015-07-02 NOTE — Telephone Encounter (Signed)
error 

## 2015-07-02 NOTE — Progress Notes (Signed)
Remote pacemaker transmission.   

## 2015-08-02 LAB — CUP PACEART REMOTE DEVICE CHECK
Battery Remaining Longevity: 117 mo
Battery Remaining Percentage: 81 %
Battery Voltage: 2.93 V
Brady Statistic AP VP Percent: 1 %
Brady Statistic AP VS Percent: 1 %
Brady Statistic AS VP Percent: 1 %
Brady Statistic AS VS Percent: 99 %
Brady Statistic RA Percent Paced: 1 %
Brady Statistic RV Percent Paced: 1 %
Date Time Interrogation Session: 20170314142715
Implantable Lead Implant Date: 20111010
Implantable Lead Implant Date: 20111010
Implantable Lead Location: 753859
Implantable Lead Location: 753860
Lead Channel Impedance Value: 330 Ohm
Lead Channel Impedance Value: 350 Ohm
Lead Channel Pacing Threshold Amplitude: 0.75 V
Lead Channel Pacing Threshold Amplitude: 0.75 V
Lead Channel Pacing Threshold Pulse Width: 0.4 ms
Lead Channel Pacing Threshold Pulse Width: 0.4 ms
Lead Channel Sensing Intrinsic Amplitude: 3 mV
Lead Channel Sensing Intrinsic Amplitude: 8.1 mV
Lead Channel Setting Pacing Amplitude: 1 V
Lead Channel Setting Pacing Amplitude: 1.75 V
Lead Channel Setting Pacing Pulse Width: 0.4 ms
Lead Channel Setting Sensing Sensitivity: 2 mV
Pulse Gen Model: 2110
Pulse Gen Serial Number: 7156802

## 2015-08-06 ENCOUNTER — Encounter: Payer: Self-pay | Admitting: Cardiology

## 2015-08-27 ENCOUNTER — Encounter: Payer: Self-pay | Admitting: Family Medicine

## 2015-08-28 NOTE — Telephone Encounter (Signed)
Please review. Should we just have him come in to discuss?

## 2015-09-09 IMAGING — CT CT ABD-PEL WO/W CM
2 of 9 series · 14 of 46 positions shown, 19 images · IV contrast (Omnipaque 300)
Comparison: Ultrasound 02/26/2014

CLINICAL DATA: Indeterminate hepatic lesion. Patient not a
candidate for MRI due to pacemaker.

CT ABDOMEN AND PELVIS WITHOUT AND WITH CONTRAST
TECHNIQUE: Multidetector CT imaging of the abdomen and pelvis was performed
following the standard protocol before and following the bolus
administration of intravenous contrast.
CONTRAST:  100 cc Omnipaque

[Series 6: liver portal-venous · axial · portal-venous · 0.75mm/px · z∈[-556,-156]mm · 11 of 157 slices shown, 16 images]
[im 12/157  soft-tissue]
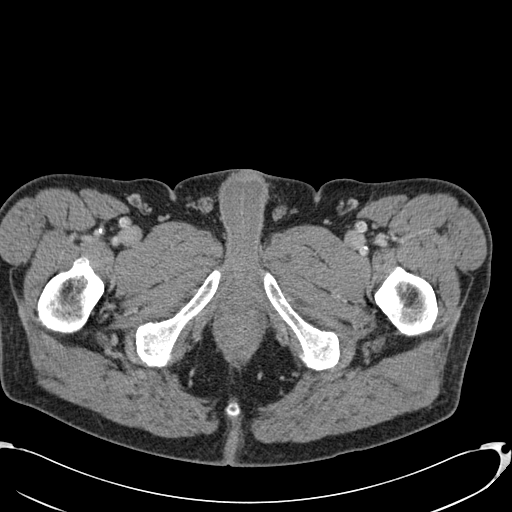
[im 12/157  bone]
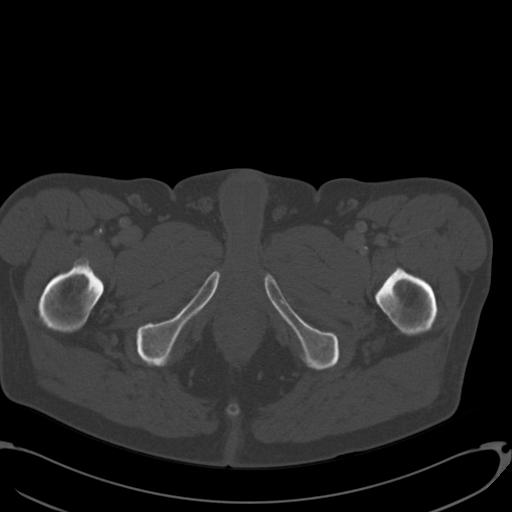
[im 23/157  soft-tissue]
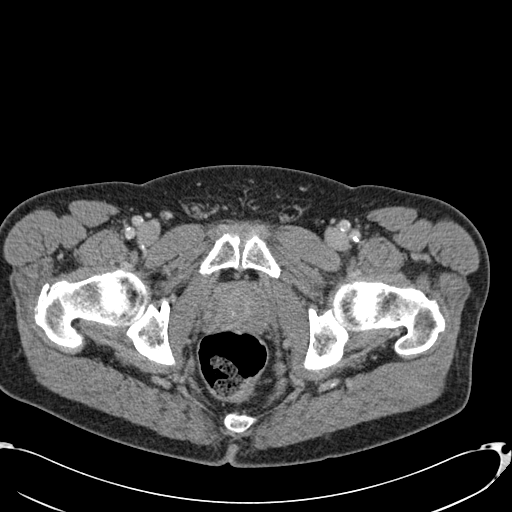
[im 45/157  soft-tissue]
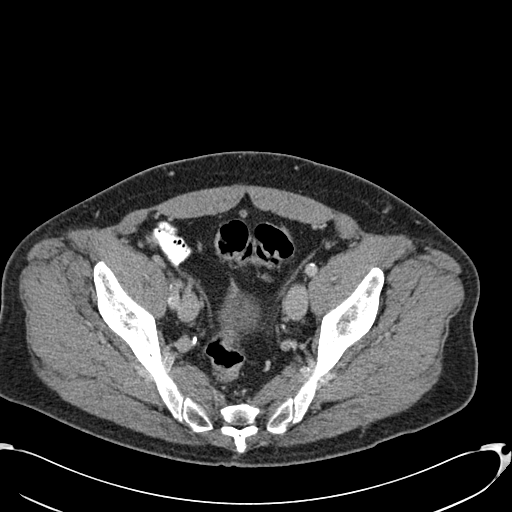
[im 56/157  soft-tissue]
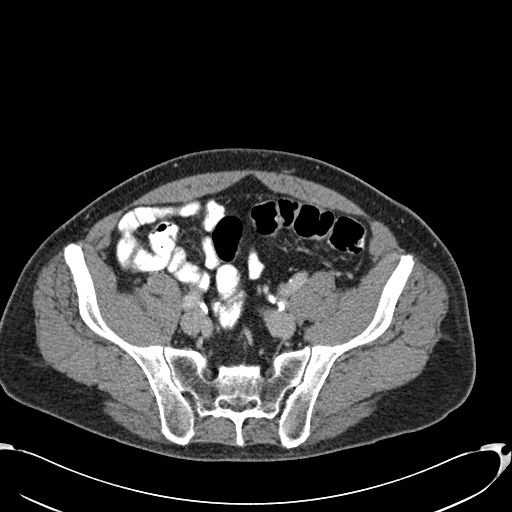
[im 67/157  soft-tissue]
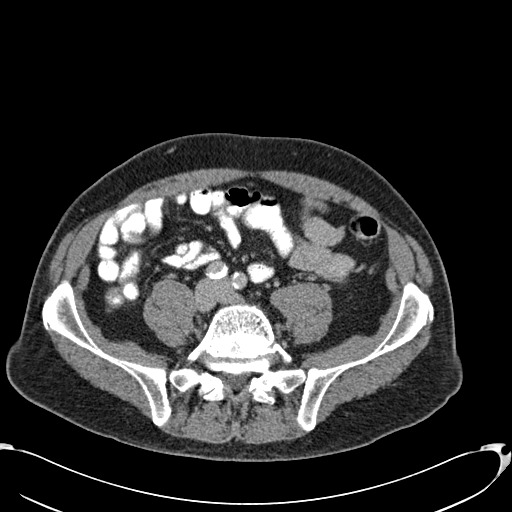
[im 90/157  soft-tissue]
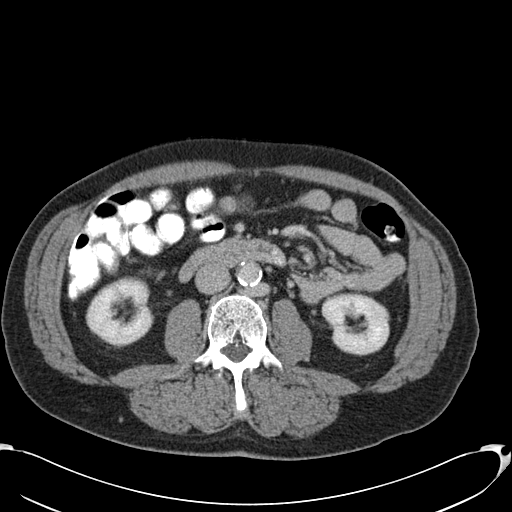
[im 101/157  soft-tissue]
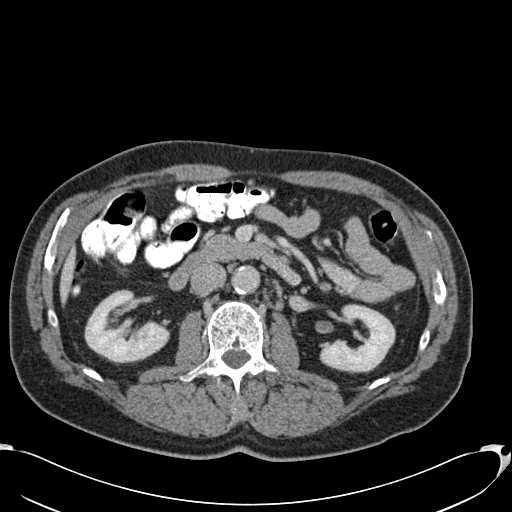
[im 112/157  soft-tissue]
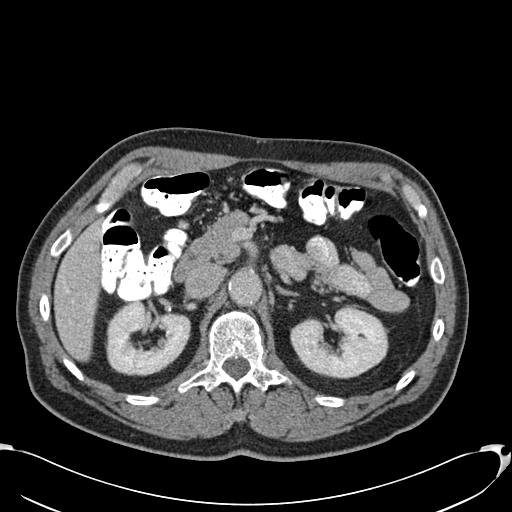
[im 112/157  lung]
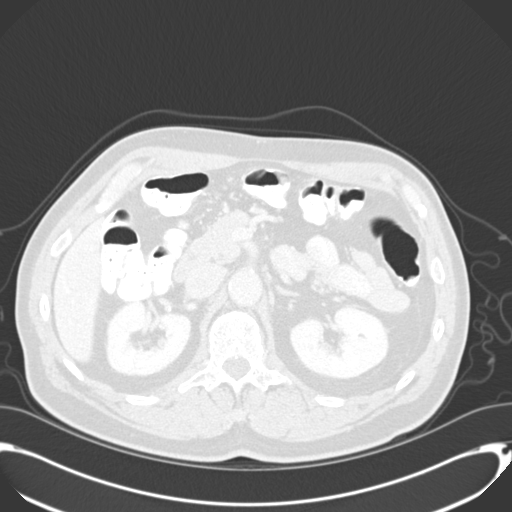
[im 123/157  lung]
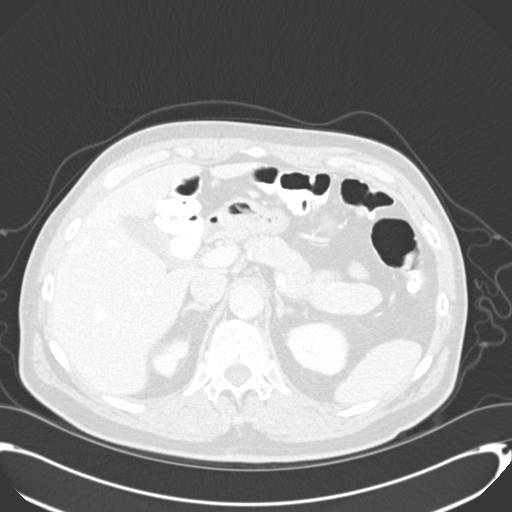
[im 134/157  soft-tissue]
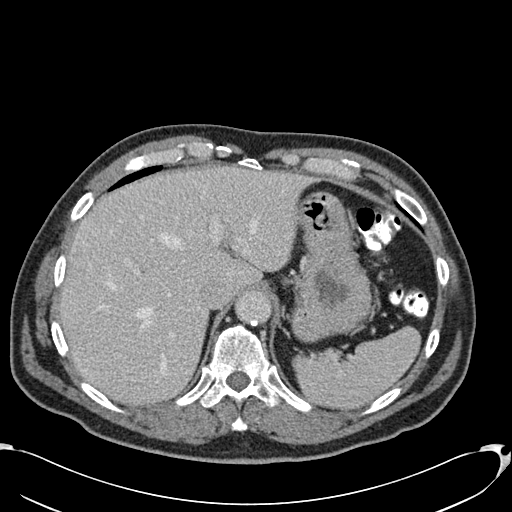
[im 134/157  lung]
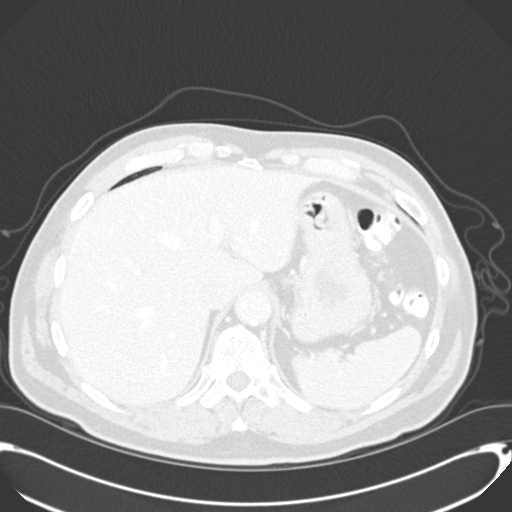
[im 134/157  bone]
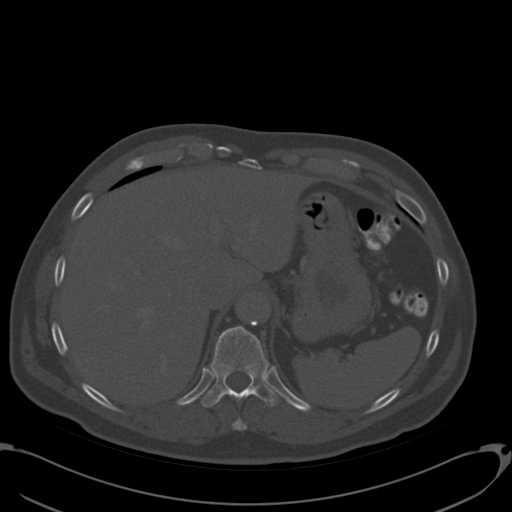
[im 145/157  soft-tissue]
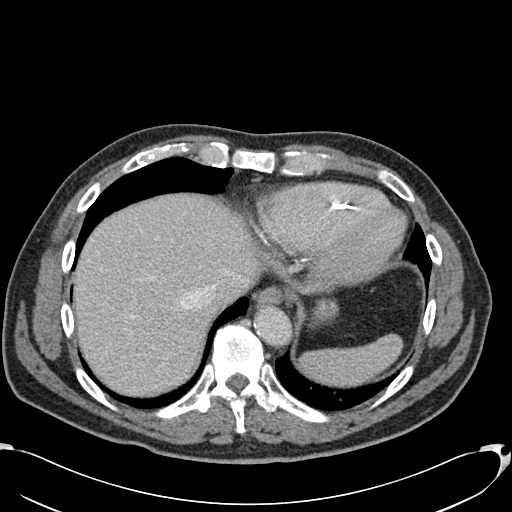
[im 145/157  lung]
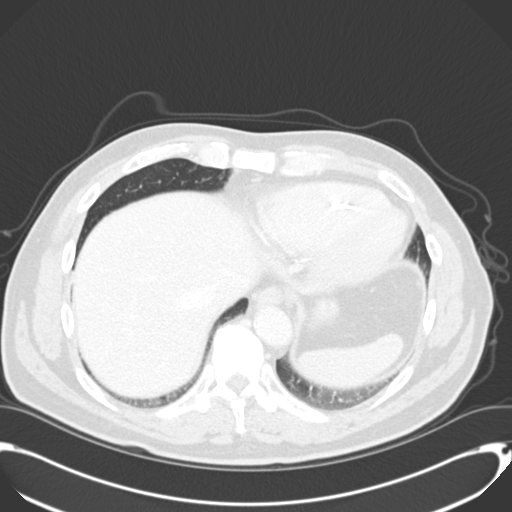

[Series 602: coronal arterial · coronal · arterial · 0.75mm/px · 3 of 114 slices shown]
[im 29/114  soft-tissue]
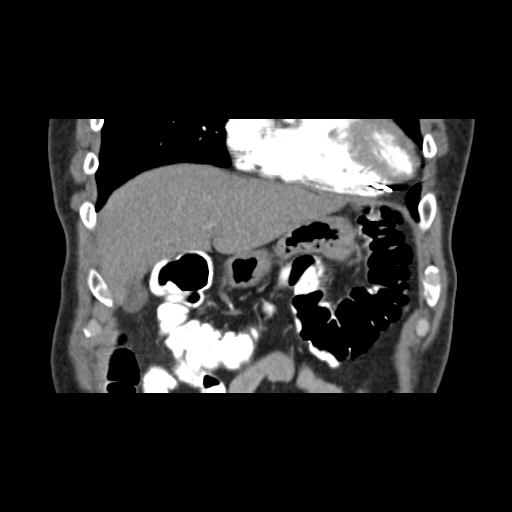
[im 57/114  soft-tissue]
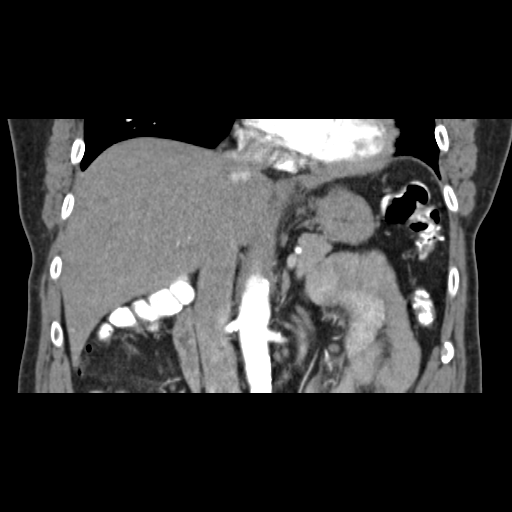
[im 85/114  soft-tissue]
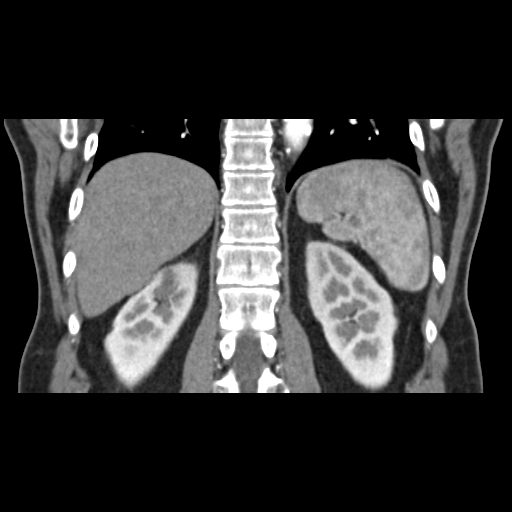

[14 of 46 positions shown; findings below may reference images not displayed]

FINDINGS: Liver: Within the subcapsular right hepatic lobe, there is a
low-density nonenhancing 13 mm lesion consistent benign hepatic
cyst(image 28 series 6). Within the inferior right hepatic lobe
there is an early arterial enhancing lesion measuring 19 mm (image
46 series 5). The is vascular lesion is evident on the more delayed
series but poorly defined. No additional hepatic lesions are
identified. The bladder is normal.

Pancreas, spleen, adrenal glands and kidneys are normal.

The stomach and limited view of the small bowel: Unremarkable.

Abdominal aorta is mildly calcified. No retroperitoneal periportal.
The
IMPRESSION: 1. 1 benign subcapsular cyst the right hepatic lobe.
2. Second hypervascular lesion inferior right hepatic lobe likely
represents a flash filling atypical hemangioma or other benign
vascular lesion. Recommend followup CT in 6 months to demonstrate
stability.

## 2015-09-25 ENCOUNTER — Ambulatory Visit: Payer: Medicare Other | Admitting: Family Medicine

## 2015-09-25 ENCOUNTER — Encounter: Payer: Self-pay | Admitting: Family Medicine

## 2015-09-25 ENCOUNTER — Ambulatory Visit (INDEPENDENT_AMBULATORY_CARE_PROVIDER_SITE_OTHER): Payer: Medicare Other | Admitting: Family Medicine

## 2015-09-25 VITALS — BP 130/80 | HR 82 | Temp 97.9°F | Ht 67.5 in | Wt 172.0 lb

## 2015-09-25 DIAGNOSIS — R1032 Left lower quadrant pain: Secondary | ICD-10-CM | POA: Diagnosis not present

## 2015-09-25 DIAGNOSIS — D1801 Hemangioma of skin and subcutaneous tissue: Secondary | ICD-10-CM | POA: Diagnosis not present

## 2015-09-25 DIAGNOSIS — M25511 Pain in right shoulder: Secondary | ICD-10-CM

## 2015-09-25 DIAGNOSIS — L570 Actinic keratosis: Secondary | ICD-10-CM | POA: Diagnosis not present

## 2015-09-25 DIAGNOSIS — L821 Other seborrheic keratosis: Secondary | ICD-10-CM | POA: Diagnosis not present

## 2015-09-25 DIAGNOSIS — Z85828 Personal history of other malignant neoplasm of skin: Secondary | ICD-10-CM | POA: Diagnosis not present

## 2015-09-25 NOTE — Progress Notes (Signed)
Pre visit review using our clinic review tool, if applicable. No additional management support is needed unless otherwise documented below in the visit note. 

## 2015-09-25 NOTE — Patient Instructions (Addendum)
Rotator Cuff Tendinitis Rotator cuff tendinitis is inflammation of the tough, cord-like bands that connect muscle to bone (tendons) in your rotator cuff. Your rotator cuff is the collection of all the muscles and tendons that connect your arm to your shoulder. Your rotator cuff holds the head of your upper arm bone (humerus) in the cup (fossa) of your shoulder blade (scapula). CAUSES Rotator cuff tendinitis is usually caused by overusing the joint involved.  SIGNS AND SYMPTOMS  Deep ache in the shoulder also felt on the outside upper arm over the shoulder muscle.  Point tenderness over the area that is injured.  Pain comes on gradually and becomes worse with lifting the arm to the side (abduction) or turning it inward (internal rotation).  May lead to a chronic tear: When a rotator cuff tendon becomes inflamed, it runs the risk of losing its blood supply, causing some tendon fibers to die. This increases the risk that the tendon can fray and partially or completely tear. DIAGNOSIS Rotator cuff tendinitis is diagnosed by taking a medical history, performing a physical exam, and reviewing results of imaging exams. The medical history is useful to help determine the type of rotator cuff injury. The physical exam will include looking at the injured shoulder, feeling the injured area, and watching you do range-of-motion exercises. X-ray exams are typically done to rule out other causes of shoulder pain, such as fractures. MRI is the imaging exam usually used for significant shoulder injuries. Sometimes a dye study called CT arthrogram is done, but it is not as widely used as MRI. In some institutions, special ultrasound tests may also be used to aid in the diagnosis. TREATMENT  Less Severe Cases  Use of a sling to rest the shoulder for a short period of time. Prolonged use of the sling can cause stiffness, weakness, and loss of motion of the shoulder joint.  Anti-inflammatory medicines, such as  ibuprofen or naproxen sodium, may be prescribed. More Severe Cases  Physical therapy.  Use of steroid injections into the shoulder joint.  Surgery. HOME CARE INSTRUCTIONS   Use a sling or splint until the pain decreases. Prolonged use of the sling can cause stiffness, weakness, and loss of motion of the shoulder joint.  Apply ice to the injured area:  Put ice in a plastic bag.  Place a towel between your skin and the bag.  Leave the ice on for 20 minutes, 2-3 times a day.  Try to avoid use other than gentle range of motion while your shoulder is painful. Use the shoulder and exercise only as directed by your health care provider. Stop exercises or range of motion if pain or discomfort increases, unless directed otherwise by your health care provider.  Only take over-the-counter or prescription medicines for pain, discomfort, or fever as directed by your health care provider.  If you were given a shoulder sling and straps (immobilizer), do not remove it except as directed, or until you see a health care provider for a follow-up exam. If you need to remove it, move your arm as little as possible or as directed.  You may want to sleep on several pillows at night to lessen swelling and pain. SEEK IMMEDIATE MEDICAL CARE IF:   Your shoulder pain increases or new pain develops in your arm, hand, or fingers and is not relieved with medicines.  You have new, unexplained symptoms, especially increased numbness in the hands or loss of strength.  You develop any worsening of the problems that  brought you in for care.  Your arm, hand, or fingers are numb or tingling.  Your arm, hand, or fingers are swollen, painful, or turn white or blue. MAKE SURE YOU:  Understand these instructions.  Will watch your condition.  Will get help right away if you are not doing well or get worse.   This information is not intended to replace advice given to you by your health care provider. Make sure  you discuss any questions you have with your health care provider.   Document Released: 06/27/2003 Document Revised: 04/27/2014 Document Reviewed: 11/16/2012 Elsevier Interactive Patient Education 2016 [employer].  Try ice application two to three times daily, 15 to 20 minute per application.

## 2015-09-25 NOTE — Progress Notes (Signed)
Subjective:    Patient ID: [name] [name], male    DOB: July 24, 1940, 75 y.o.   MRN: [id number]  HPI  Seen for the following items  Right shoulder pain. He's had some issues now for several months. No injury. Really very minimal pain. He's noted slightly restricted range of motion at times with things like abduction and internal rotation. No neck pain. No recent x-rays. Currently not limiting activity months. 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 No radiculopathy symptoms.  Has taken occasional Aleve without much improvement  Intermittent left lower quadrant bowel pain for a few years. Extensive workup during the past couple years with GI with CT abdomen pelvis as well as colonoscopy. Mild diverticulosis. No history of diverticulitis. No recent stool changes. No bloody stools. No appetite or weight changes. No exacerbating or alleviating factors.  Past Medical History  Diagnosis Date  . HYPERTENSION 07/17/2008  . SYNCOPE 07/24/2008  . CHEST WALL PAIN, ACUTE 10/11/2009  . Sinus node dysfunction ([place]) 01/27/2011  . 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 GI problem   . Sleep apnea     doesnt wear cpap  . HYPERLIPIDEMIA 07/17/2008    on medicine  . GERD (gastroesophageal reflux disease)    Past Surgical History  Procedure Laterality Date  . Pacemaker placement      St Jude loop implantation 2010  . Colonoscopy    . Polypectomy      reports that he quit smoking about 33 years ago. His smoking use included Cigarettes. He has a 20 pack-year smoking history. He has never used smokeless tobacco. He reports that he drinks about 7.0 oz of alcohol per week. He reports that he does not use illicit drugs. family history includes Alcohol abuse in his maternal aunt; Colitis in his father; Hypertension in his father and mother; Other in his father; Stroke in his mother. There is no history of Colon cancer, Rectal cancer, Stomach cancer, or Esophageal cancer. No Known  Allergies   Review of Systems  Constitutional: Negative for fever and chills.  Respiratory: Negative for cough.   Cardiovascular: Negative for chest pain.  Gastrointestinal: Positive for abdominal pain. Negative for nausea, vomiting, diarrhea, constipation, blood in stool and abdominal distention.  Genitourinary: Negative for dysuria.  Musculoskeletal: Negative for back pain.  Neurological: Negative for weakness and numbness.       Objective:   Physical Exam  Constitutional: He appears well-developed and well-nourished. No distress.  Cardiovascular: Normal rate and regular rhythm.   Pulmonary/Chest: Effort normal and breath sounds normal. No respiratory distress. He has no wheezes. He has no rales.  Abdominal: Soft. Bowel sounds are normal. He exhibits no distension and no mass. There is no rebound and no guarding.  Minimally tender to palpation left lower quadrant. No guarding or rebound. No mass.  Musculoskeletal:  Shoulders are symmetric in appearance. He has good range of motion in both shoulders. Minimal biceps tendon tenderness over the proximal biceps tendon. He has minimal pain with abduction against resistance. Mild pain with internal rotation. No acromioclavicular tenderness.  Neurological:  No rotator cuff weakness          Assessment & Plan:  #1 right shoulder pain. Suspect he may have combination of some biceps tendinitis as well as rotator cuff tendinitis. No evidence for significant adhesive capsulitis at this time. Continue range of motion. He'll try some icing. We offered injection for his rotator cuff tendinitis but he declines at this point. Avoid regular use of non-steroidals  #  2 chronic intermittent left lower quadrant abdominal pain. Previous GI workup unrevealing as above. Could have some mild intermittent diverticulitis but has not had a recent fever and symptoms are stable this time. Follow-up promptly for any fever or any persistent or progressive  symptoms  [name] MD [facility]

## 2015-10-01 ENCOUNTER — Ambulatory Visit (INDEPENDENT_AMBULATORY_CARE_PROVIDER_SITE_OTHER): Payer: Medicare Other | Admitting: *Deleted

## 2015-10-01 DIAGNOSIS — I495 Sick sinus syndrome: Secondary | ICD-10-CM

## 2015-10-01 DIAGNOSIS — Z950 Presence of cardiac pacemaker: Secondary | ICD-10-CM | POA: Diagnosis not present

## 2015-10-01 NOTE — Progress Notes (Signed)
Remote pacemaker transmission.   

## 2015-10-07 LAB — CUP PACEART REMOTE DEVICE CHECK
Battery Remaining Longevity: 130 mo
Battery Remaining Percentage: 91 %
Battery Voltage: 2.95 V
Brady Statistic AP VP Percent: 1 %
Brady Statistic AP VS Percent: 1 %
Brady Statistic AS VP Percent: 1 %
Brady Statistic AS VS Percent: 99 %
Brady Statistic RA Percent Paced: 1 %
Brady Statistic RV Percent Paced: 1 %
Date Time Interrogation Session: 20170613121828
Implantable Lead Implant Date: 20111010
Implantable Lead Implant Date: 20111010
Implantable Lead Location: 753859
Implantable Lead Location: 753860
Lead Channel Impedance Value: 330 Ohm
Lead Channel Impedance Value: 340 Ohm
Lead Channel Pacing Threshold Amplitude: 0.75 V
Lead Channel Pacing Threshold Amplitude: 1 V
Lead Channel Pacing Threshold Pulse Width: 0.4 ms
Lead Channel Pacing Threshold Pulse Width: 0.4 ms
Lead Channel Sensing Intrinsic Amplitude: 3.1 mV
Lead Channel Sensing Intrinsic Amplitude: 6.4 mV
Lead Channel Setting Pacing Amplitude: 1.25 V
Lead Channel Setting Pacing Amplitude: 1.75 V
Lead Channel Setting Pacing Pulse Width: 0.4 ms
Lead Channel Setting Sensing Sensitivity: 2 mV
Pulse Gen Model: 2110
Pulse Gen Serial Number: 7156802

## 2015-10-09 ENCOUNTER — Encounter: Payer: Self-pay | Admitting: Cardiology

## 2015-12-05 LAB — CBC AND DIFFERENTIAL
HCT: 40 % — AB (ref 41–53)
Hemoglobin: 14.3 g/dL (ref 13.5–17.5)
Neutrophils Absolute: 58 /uL
Platelets: 171 10*3/uL (ref 150–399)
WBC: 7.1 10^3/mL

## 2015-12-05 LAB — HEPATIC FUNCTION PANEL
ALT: 32 U/L (ref 10–40)
AST: 22 U/L (ref 14–40)
Alkaline Phosphatase: 47 U/L (ref 25–125)
Bilirubin, Total: 0.7 mg/dL

## 2015-12-05 LAB — LIPID PANEL
Cholesterol: 215 mg/dL — AB (ref 0–200)
HDL: 72 mg/dL — AB (ref 35–70)
LDL Cholesterol: 125 mg/dL
Triglycerides: 90 mg/dL (ref 40–160)

## 2015-12-05 LAB — BASIC METABOLIC PANEL
BUN: 21 mg/dL (ref 4–21)
Creatinine: 0.9 mg/dL (ref 0.6–1.3)
Glucose: 88 mg/dL
Potassium: 4.4 mmol/L (ref 3.4–5.3)
Sodium: 136 mmol/L — AB (ref 137–147)

## 2015-12-05 LAB — PSA: PSA: 1.14

## 2015-12-19 ENCOUNTER — Encounter: Payer: Self-pay | Admitting: Family Medicine

## 2015-12-27 ENCOUNTER — Encounter: Payer: Self-pay | Admitting: Family Medicine

## 2015-12-31 ENCOUNTER — Telehealth: Payer: Self-pay | Admitting: Cardiology

## 2015-12-31 ENCOUNTER — Other Ambulatory Visit: Payer: Self-pay

## 2015-12-31 ENCOUNTER — Encounter: Payer: Medicare Other | Admitting: *Deleted

## 2015-12-31 DIAGNOSIS — M25511 Pain in right shoulder: Secondary | ICD-10-CM

## 2015-12-31 NOTE — Telephone Encounter (Signed)
LMOVM reminding pt to send remote transmission.   

## 2016-01-03 ENCOUNTER — Encounter: Payer: Self-pay | Admitting: Cardiology

## 2016-01-06 ENCOUNTER — Ambulatory Visit (INDEPENDENT_AMBULATORY_CARE_PROVIDER_SITE_OTHER): Payer: Medicare Other | Admitting: *Deleted

## 2016-01-06 DIAGNOSIS — I495 Sick sinus syndrome: Secondary | ICD-10-CM | POA: Diagnosis not present

## 2016-01-06 DIAGNOSIS — Z950 Presence of cardiac pacemaker: Secondary | ICD-10-CM

## 2016-01-06 NOTE — Progress Notes (Signed)
Remote pacemaker transmission.   

## 2016-01-08 ENCOUNTER — Encounter: Payer: Self-pay | Admitting: Cardiology

## 2016-01-20 DIAGNOSIS — M19011 Primary osteoarthritis, right shoulder: Secondary | ICD-10-CM | POA: Diagnosis not present

## 2016-01-23 DIAGNOSIS — Z23 Encounter for immunization: Secondary | ICD-10-CM | POA: Diagnosis not present

## 2016-01-30 DIAGNOSIS — M19011 Primary osteoarthritis, right shoulder: Secondary | ICD-10-CM | POA: Diagnosis not present

## 2016-01-30 LAB — CUP PACEART REMOTE DEVICE CHECK
Battery Remaining Longevity: 116 mo
Battery Remaining Percentage: 81 %
Battery Voltage: 2.93 V
Brady Statistic AP VP Percent: 1 %
Brady Statistic AP VS Percent: 1 %
Brady Statistic AS VP Percent: 1 %
Brady Statistic AS VS Percent: 99 %
Brady Statistic RA Percent Paced: 1 %
Brady Statistic RV Percent Paced: 1 %
Date Time Interrogation Session: 20170918121329
Implantable Lead Implant Date: 20111010
Implantable Lead Implant Date: 20111010
Implantable Lead Location: 753859
Implantable Lead Location: 753860
Lead Channel Impedance Value: 310 Ohm
Lead Channel Impedance Value: 330 Ohm
Lead Channel Pacing Threshold Amplitude: 0.75 V
Lead Channel Pacing Threshold Amplitude: 1 V
Lead Channel Pacing Threshold Pulse Width: 0.4 ms
Lead Channel Pacing Threshold Pulse Width: 0.4 ms
Lead Channel Sensing Intrinsic Amplitude: 2.2 mV
Lead Channel Sensing Intrinsic Amplitude: 5.9 mV
Lead Channel Setting Pacing Amplitude: 1.25 V
Lead Channel Setting Pacing Amplitude: 1.75 V
Lead Channel Setting Pacing Pulse Width: 0.4 ms
Lead Channel Setting Sensing Sensitivity: 2 mV
Pulse Gen Model: 2110
Pulse Gen Serial Number: 7156802

## 2016-03-04 DIAGNOSIS — M19011 Primary osteoarthritis, right shoulder: Secondary | ICD-10-CM | POA: Diagnosis not present

## 2016-03-05 ENCOUNTER — Other Ambulatory Visit: Payer: Self-pay | Admitting: Specialist

## 2016-03-05 DIAGNOSIS — M25511 Pain in right shoulder: Secondary | ICD-10-CM

## 2016-03-11 ENCOUNTER — Other Ambulatory Visit: Payer: Self-pay | Admitting: Specialist

## 2016-03-11 DIAGNOSIS — M25511 Pain in right shoulder: Secondary | ICD-10-CM

## 2016-03-23 ENCOUNTER — Other Ambulatory Visit: Payer: Self-pay | Admitting: Internal Medicine

## 2016-03-24 ENCOUNTER — Telehealth: Payer: Self-pay | Admitting: Internal Medicine

## 2016-03-24 NOTE — Telephone Encounter (Signed)
Medication Detail    Disp Refills Start End   lisinopril-hydrochlorothiazide (PRINZIDE,ZESTORETIC) 20-12.5 MG tablet 90 tablet 0 03/24/2016    Sig: TAKE ONE TABLET BY MOUTH DAILY   Notes to Pharmacy: Please keep upcoming appointment for future refills. Thank you   E-Prescribing Status: Receipt confirmed by pharmacy (03/24/2016 8:37 AM EST)   Pharmacy   [facility] [place], [place] [id number]

## 2016-03-24 NOTE — Telephone Encounter (Signed)
Already sent in today as below. 

## 2016-03-24 NOTE — Telephone Encounter (Signed)
New message  1. Lisinopril 20 12.5 mg 1 tab daily 2. [facility]/[place] 3. 90 day supply

## 2016-03-27 ENCOUNTER — Encounter: Payer: Self-pay | Admitting: Internal Medicine

## 2016-03-27 ENCOUNTER — Other Ambulatory Visit: Payer: Self-pay

## 2016-03-30 DIAGNOSIS — Z85828 Personal history of other malignant neoplasm of skin: Secondary | ICD-10-CM | POA: Diagnosis not present

## 2016-03-30 DIAGNOSIS — L821 Other seborrheic keratosis: Secondary | ICD-10-CM | POA: Diagnosis not present

## 2016-03-30 DIAGNOSIS — L570 Actinic keratosis: Secondary | ICD-10-CM | POA: Diagnosis not present

## 2016-03-30 DIAGNOSIS — D1801 Hemangioma of skin and subcutaneous tissue: Secondary | ICD-10-CM | POA: Diagnosis not present

## 2016-03-30 DIAGNOSIS — D692 Other nonthrombocytopenic purpura: Secondary | ICD-10-CM | POA: Diagnosis not present

## 2016-04-01 ENCOUNTER — Other Ambulatory Visit: Payer: Medicare Other

## 2016-04-02 ENCOUNTER — Encounter: Payer: Self-pay | Admitting: Internal Medicine

## 2016-04-02 ENCOUNTER — Ambulatory Visit (INDEPENDENT_AMBULATORY_CARE_PROVIDER_SITE_OTHER): Payer: Medicare Other | Admitting: Internal Medicine

## 2016-04-02 VITALS — BP 116/64 | HR 89 | Ht 67.0 in | Wt 169.6 lb

## 2016-04-02 DIAGNOSIS — I495 Sick sinus syndrome: Secondary | ICD-10-CM

## 2016-04-02 DIAGNOSIS — Z950 Presence of cardiac pacemaker: Secondary | ICD-10-CM | POA: Diagnosis not present

## 2016-04-02 LAB — CUP PACEART INCLINIC DEVICE CHECK
Battery Voltage: 2.93 V
Brady Statistic RA Percent Paced: 0.73 %
Brady Statistic RV Percent Paced: 0.07 %
Date Time Interrogation Session: 20171214165359
Implantable Lead Implant Date: 20111010
Implantable Lead Implant Date: 20111010
Implantable Lead Location: 753859
Implantable Lead Location: 753860
Implantable Pulse Generator Implant Date: 20111010
Lead Channel Impedance Value: 325 Ohm
Lead Channel Impedance Value: 337.5 Ohm
Lead Channel Pacing Threshold Amplitude: 0.75 V
Lead Channel Pacing Threshold Amplitude: 0.75 V
Lead Channel Pacing Threshold Amplitude: 1 V
Lead Channel Pacing Threshold Amplitude: 1 V
Lead Channel Pacing Threshold Pulse Width: 0.4 ms
Lead Channel Pacing Threshold Pulse Width: 0.4 ms
Lead Channel Pacing Threshold Pulse Width: 0.4 ms
Lead Channel Pacing Threshold Pulse Width: 0.4 ms
Lead Channel Sensing Intrinsic Amplitude: 2.5 mV
Lead Channel Sensing Intrinsic Amplitude: 8.5 mV
Lead Channel Setting Pacing Amplitude: 1.125
Lead Channel Setting Pacing Amplitude: 1.75 V
Lead Channel Setting Pacing Pulse Width: 0.4 ms
Lead Channel Setting Sensing Sensitivity: 2 mV
Pulse Gen Model: 2110
Pulse Gen Serial Number: 7156802

## 2016-04-02 MED ORDER — BENAZEPRIL HCL 20 MG PO TABS: 20.0000 mg | ORAL_TABLET | Freq: Every day | ORAL | 3 refills | Status: DC

## 2016-04-02 NOTE — Patient Instructions (Addendum)
Medication Instructions: Your physician has recommended you make the following change in your medication:   --1. START Benazepril 20 mg - take 1 tablet by mouth daily  Labwork: None Ordered  Procedures/Testing: None Ordered  Follow-Up: Remote monitoring is used to monitor your Pacemaker of ICD from home. This monitoring reduces the number of office visits required to check your device to one time per year. It allows [place] to keep an eye on the functioning of your device to ensure it is working properly. You are scheduled for a device check from home on 07/02/2016. You may send your transmission at any time that day. If you have a wireless device, the transmission will be sent automatically. After your physician reviews your transmission, you will receive a postcard with your next transmission date.  Your physician wants you to follow-up in: 1 Year with Chanetta Marshall, NP You will receive a reminder letter in the mail two months in advance. If you don't receive a letter, please call our office to schedule the follow-up appointment.  If you need a refill on your cardiac medications before your next appointment, please call your pharmacy.

## 2016-04-02 NOTE — Progress Notes (Signed)
      Patient Care Team: [name], MD as PCP - General   HPI  [name] [name] is a 75 y.o. male Seen in followup for recurrent syncope associated with demonstrated sinus node dysfunction by implantable loop recorder. He is status post pacemaker implantation 10/11    No recurrent syncope. No complaints of chest pain or shortness of breath  He is selling the [employer]   Past Medical History:  Diagnosis Date  . CHEST WALL PAIN, ACUTE 10/11/2009  . GERD (gastroesophageal reflux disease)   . GI problem   . HYPERLIPIDEMIA 07/17/2008   on medicine  . HYPERTENSION 07/17/2008  . 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 Sinus node dysfunction ([place]) 01/27/2011  . Sleep apnea    doesnt wear cpap  . SYNCOPE 07/24/2008    Past Surgical History:  Procedure Laterality Date  . COLONOSCOPY    . PACEMAKER PLACEMENT     St Jude loop implantation 2010  . POLYPECTOMY      Current Outpatient Prescriptions  Medication Sig Dispense Refill  . lisinopril-hydrochlorothiazide (PRINZIDE,ZESTORETIC) 20-12.5 MG tablet TAKE ONE TABLET BY MOUTH DAILY 90 tablet 0  . rosuvastatin (CRESTOR) 5 MG tablet Take 2.5 mg by mouth daily.     . sucralfate (CARAFATE) 1 GM/10ML suspension Take 1 g by mouth 4 (four) times daily -  with meals and at bedtime.     No current facility-administered medications for this visit.     No Known Allergies  Review of Systems negative except from HPI and PMH  Physical Exam BP 116/64   Pulse 89   Ht 5\' 7"  (1.702 m)   Wt 169 lb 9.6 oz (76.9 kg)   SpO2 97%   BMI 26.56 kg/m  Well developed and well nourished in no acute distress HENT normal E scleral and icterus clear Neck Supple JVP flat; carotids brisk and full Clear to ausculation  Device pocket well healed; without hematoma or erythema.  There is no tethering Regular rate and rhythm, no murmurs gallops or rub Soft with active bowel sounds No clubbing cyanosis none Edema Alert and oriented, grossly normal motor and  sensory function Skin Warm and Dry  ECG is sinus at 53 Intervals 18/10/41 Otherwise normal  Assessment and  Plan  Sinus node dysfunction  Syncope  Pacemaker-St. Jude The patient's device was interrogated.  The information was reviewed. No changes were made in the programming.    Hypertension  Blood pressure is much better. He is frustrated with the diuresis. We will put him back on an benazparil 20 milligrams a day.     No syncope

## 2016-04-09 ENCOUNTER — Ambulatory Visit
Admission: RE | Admit: 2016-04-09 | Discharge: 2016-04-09 | Disposition: A | Discharge status: Home / Self Care | Payer: Medicare Other | Source: Ambulatory Visit | Attending: Specialist | Admitting: Specialist

## 2016-04-09 DIAGNOSIS — M25511 Pain in right shoulder: Secondary | ICD-10-CM | POA: Diagnosis not present

## 2016-04-09 DIAGNOSIS — M7581 Other shoulder lesions, right shoulder: Secondary | ICD-10-CM | POA: Diagnosis not present

## 2016-04-09 MED ORDER — IOPAMIDOL (ISOVUE-M 200) INJECTION 41%
15.0000 mL | Freq: Once | INTRAMUSCULAR | Status: AC
  Administered 2016-04-09: 15 mL via INTRA_ARTICULAR

## 2016-05-05 ENCOUNTER — Encounter: Payer: Self-pay | Admitting: Internal Medicine

## 2016-05-13 DIAGNOSIS — M19011 Primary osteoarthritis, right shoulder: Secondary | ICD-10-CM | POA: Diagnosis not present

## 2016-06-04 ENCOUNTER — Telehealth: Payer: Self-pay | Admitting: Family Medicine

## 2016-06-04 NOTE — Telephone Encounter (Signed)
Pt would like to have a referral to a urologist pt went to the [facility] in [place] 05/28/16 and they stated that he needs to go and see a urologist but they aren't able to get him in until late April and would like to have a call to discuss this further with Dr. Elease Hashimoto.

## 2016-06-05 ENCOUNTER — Encounter: Payer: Self-pay | Admitting: Family Medicine

## 2016-06-05 NOTE — Telephone Encounter (Signed)
[facility]ychart message sent on this pt as well. Please review. Thanks.

## 2016-06-05 NOTE — Telephone Encounter (Signed)
OK to refer but need diagnosis/concern for why they were recommending.

## 2016-06-08 ENCOUNTER — Other Ambulatory Visit: Payer: Self-pay

## 2016-06-08 DIAGNOSIS — N2889 Other specified disorders of kidney and ureter: Secondary | ICD-10-CM

## 2016-06-08 NOTE — Telephone Encounter (Signed)
Order entered for referral. Pt is aware via [facility].

## 2016-06-10 DIAGNOSIS — D3001 Benign neoplasm of right kidney: Secondary | ICD-10-CM | POA: Diagnosis not present

## 2016-07-02 ENCOUNTER — Telehealth: Payer: Self-pay | Admitting: Cardiology

## 2016-07-02 ENCOUNTER — Ambulatory Visit (INDEPENDENT_AMBULATORY_CARE_PROVIDER_SITE_OTHER): Payer: Medicare Other | Admitting: *Deleted

## 2016-07-02 ENCOUNTER — Telehealth: Payer: Self-pay | Admitting: Internal Medicine

## 2016-07-02 DIAGNOSIS — I495 Sick sinus syndrome: Secondary | ICD-10-CM | POA: Diagnosis not present

## 2016-07-02 NOTE — Telephone Encounter (Signed)
New message    Pt is calling about his home remote. He states his tower light came on and all the lights but the read icon never came on.

## 2016-07-02 NOTE — Progress Notes (Signed)
Remote pacemaker transmission.   

## 2016-07-02 NOTE — Telephone Encounter (Signed)
LMOVM reminding pt to send remote transmission.   

## 2016-07-02 NOTE — Telephone Encounter (Signed)
Spoke with pt regarding remote transmission, pt having trouble with home monitor getting transmission to push through, number given to [facility], pt stated he would call.

## 2016-07-03 ENCOUNTER — Encounter: Payer: Self-pay | Admitting: Cardiology

## 2016-07-06 LAB — CUP PACEART REMOTE DEVICE CHECK
Battery Remaining Longevity: 115 mo
Battery Remaining Percentage: 81 %
Battery Voltage: 2.93 V
Brady Statistic AP VP Percent: 1 %
Brady Statistic AP VS Percent: 1 %
Brady Statistic AS VP Percent: 1 %
Brady Statistic AS VS Percent: 99 %
Brady Statistic RA Percent Paced: 1 %
Brady Statistic RV Percent Paced: 1 %
Date Time Interrogation Session: 20180315113251
Implantable Lead Implant Date: 20111010
Implantable Lead Implant Date: 20111010
Implantable Lead Location: 753859
Implantable Lead Location: 753860
Implantable Pulse Generator Implant Date: 20111010
Lead Channel Impedance Value: 310 Ohm
Lead Channel Impedance Value: 330 Ohm
Lead Channel Pacing Threshold Amplitude: 0.75 V
Lead Channel Pacing Threshold Amplitude: 1.125 V
Lead Channel Pacing Threshold Pulse Width: 0.4 ms
Lead Channel Pacing Threshold Pulse Width: 0.4 ms
Lead Channel Sensing Intrinsic Amplitude: 2.7 mV
Lead Channel Sensing Intrinsic Amplitude: 6.4 mV
Lead Channel Setting Pacing Amplitude: 1.375
Lead Channel Setting Pacing Amplitude: 1.75 V
Lead Channel Setting Pacing Pulse Width: 0.4 ms
Lead Channel Setting Sensing Sensitivity: 2 mV
Pulse Gen Model: 2110
Pulse Gen Serial Number: 7156802

## 2016-09-28 DIAGNOSIS — D485 Neoplasm of uncertain behavior of skin: Secondary | ICD-10-CM | POA: Diagnosis not present

## 2016-09-28 DIAGNOSIS — Z85828 Personal history of other malignant neoplasm of skin: Secondary | ICD-10-CM | POA: Diagnosis not present

## 2016-09-28 DIAGNOSIS — L570 Actinic keratosis: Secondary | ICD-10-CM | POA: Diagnosis not present

## 2016-09-28 DIAGNOSIS — B353 Tinea pedis: Secondary | ICD-10-CM | POA: Diagnosis not present

## 2016-09-28 DIAGNOSIS — L821 Other seborrheic keratosis: Secondary | ICD-10-CM | POA: Diagnosis not present

## 2016-09-28 DIAGNOSIS — L720 Epidermal cyst: Secondary | ICD-10-CM | POA: Diagnosis not present

## 2016-10-01 ENCOUNTER — Ambulatory Visit (INDEPENDENT_AMBULATORY_CARE_PROVIDER_SITE_OTHER): Payer: Medicare Other | Admitting: *Deleted

## 2016-10-01 DIAGNOSIS — I495 Sick sinus syndrome: Secondary | ICD-10-CM | POA: Diagnosis not present

## 2016-10-01 NOTE — Progress Notes (Signed)
Remote pacemaker transmission.   

## 2016-10-02 ENCOUNTER — Encounter: Payer: Self-pay | Admitting: Cardiology

## 2016-11-16 DIAGNOSIS — K5790 Diverticulosis of intestine, part unspecified, without perforation or abscess without bleeding: Secondary | ICD-10-CM | POA: Diagnosis present

## 2016-11-16 DIAGNOSIS — I10 Essential (primary) hypertension: Secondary | ICD-10-CM | POA: Diagnosis not present

## 2016-11-16 DIAGNOSIS — R918 Other nonspecific abnormal finding of lung field: Secondary | ICD-10-CM | POA: Diagnosis present

## 2016-11-16 DIAGNOSIS — Z950 Presence of cardiac pacemaker: Secondary | ICD-10-CM | POA: Diagnosis not present

## 2016-11-16 DIAGNOSIS — R10814 Left lower quadrant abdominal tenderness: Secondary | ICD-10-CM | POA: Diagnosis not present

## 2016-11-16 DIAGNOSIS — I495 Sick sinus syndrome: Secondary | ICD-10-CM | POA: Diagnosis present

## 2016-11-16 DIAGNOSIS — N281 Cyst of kidney, acquired: Secondary | ICD-10-CM | POA: Diagnosis not present

## 2016-11-16 DIAGNOSIS — Z87891 Personal history of nicotine dependence: Secondary | ICD-10-CM | POA: Diagnosis not present

## 2016-11-16 DIAGNOSIS — N2889 Other specified disorders of kidney and ureter: Secondary | ICD-10-CM | POA: Diagnosis present

## 2016-11-16 DIAGNOSIS — K7689 Other specified diseases of liver: Secondary | ICD-10-CM | POA: Diagnosis not present

## 2016-11-16 DIAGNOSIS — K5730 Diverticulosis of large intestine without perforation or abscess without bleeding: Secondary | ICD-10-CM | POA: Diagnosis not present

## 2016-11-16 DIAGNOSIS — D72829 Elevated white blood cell count, unspecified: Secondary | ICD-10-CM | POA: Diagnosis not present

## 2016-11-16 DIAGNOSIS — N289 Disorder of kidney and ureter, unspecified: Secondary | ICD-10-CM | POA: Diagnosis not present

## 2016-11-16 DIAGNOSIS — R932 Abnormal findings on diagnostic imaging of liver and biliary tract: Secondary | ICD-10-CM | POA: Diagnosis not present

## 2016-11-16 DIAGNOSIS — D72819 Decreased white blood cell count, unspecified: Secondary | ICD-10-CM | POA: Diagnosis not present

## 2016-11-16 DIAGNOSIS — R5081 Fever presenting with conditions classified elsewhere: Secondary | ICD-10-CM | POA: Diagnosis present

## 2016-11-16 DIAGNOSIS — R509 Fever, unspecified: Secondary | ICD-10-CM | POA: Diagnosis not present

## 2016-11-16 DIAGNOSIS — R3129 Other microscopic hematuria: Secondary | ICD-10-CM | POA: Diagnosis not present

## 2016-11-16 DIAGNOSIS — E785 Hyperlipidemia, unspecified: Secondary | ICD-10-CM | POA: Diagnosis present

## 2016-11-16 DIAGNOSIS — M545 Low back pain: Secondary | ICD-10-CM | POA: Diagnosis not present

## 2016-11-16 DIAGNOSIS — R1032 Left lower quadrant pain: Secondary | ICD-10-CM | POA: Diagnosis not present

## 2016-11-16 DIAGNOSIS — R9431 Abnormal electrocardiogram [ECG] [EKG]: Secondary | ICD-10-CM | POA: Diagnosis not present

## 2016-11-16 DIAGNOSIS — D709 Neutropenia, unspecified: Secondary | ICD-10-CM | POA: Diagnosis not present

## 2016-11-16 DIAGNOSIS — D696 Thrombocytopenia, unspecified: Secondary | ICD-10-CM | POA: Diagnosis not present

## 2016-11-16 DIAGNOSIS — I7409 Other arterial embolism and thrombosis of abdominal aorta: Secondary | ICD-10-CM | POA: Diagnosis not present

## 2016-11-16 LAB — CUP PACEART REMOTE DEVICE CHECK
Date Time Interrogation Session: 20180730092812
Implantable Lead Implant Date: 20111010
Implantable Lead Implant Date: 20111010
Implantable Lead Location: 753859
Implantable Lead Location: 753860
Implantable Pulse Generator Implant Date: 20111010
Pulse Gen Model: 2110
Pulse Gen Serial Number: 7156802

## 2016-11-25 ENCOUNTER — Ambulatory Visit (INDEPENDENT_AMBULATORY_CARE_PROVIDER_SITE_OTHER): Payer: Medicare Other | Admitting: Family Medicine

## 2016-11-25 VITALS — BP 120/70 | HR 85 | Temp 98.2°F | Wt 172.0 lb

## 2016-11-25 DIAGNOSIS — D72819 Decreased white blood cell count, unspecified: Secondary | ICD-10-CM

## 2016-11-25 DIAGNOSIS — D696 Thrombocytopenia, unspecified: Secondary | ICD-10-CM | POA: Diagnosis not present

## 2016-11-25 DIAGNOSIS — A770 Spotted fever due to Rickettsia rickettsii: Secondary | ICD-10-CM | POA: Diagnosis not present

## 2016-11-25 NOTE — Progress Notes (Addendum)
Subjective:     Patient ID: [name] [name], male   DOB: [birth year], 76 y.o.   MRN: [id number]  HPI Patient seen for hospital follow-up. He was up and [place] couple weeks ago visiting family. He went up there July 27 and the following day (which was Saturday) had fever 102.3 along with some malaise, bodyaches, and headache. He initially went to urgent care and had several labs done and then was referred to hospital and was admitted there on Monday. They apparently placed him on broad-spectrum antibiotics here he states he was "dehydrated ". CT abdomen and pelvis reportedly unremarkable. He had by report very low white blood count and low platelets.  We do not have records at this time but he did bring by some this past week and these are currently being scanned into the chart-not immediately available for review.  Marland Kitchen He was not improving with IV fluids and broad-spectrum antibiotics. He was seen in consultation by infectious disease. They apparently added doxycycline to his regimen on Wednesday and by Friday he was improving greatly. He was afebrile and headaches and body aches were improving. He was discharged last Friday and came home last Sunday. He actually returned to home exercises with work out 2 days ago. Is feeling close to baseline at this time.  Never had any rash. He states he was diagnosed with [facility] spotted fever. He is still taking doxycycline .  He recalls a small ?deer tick over a month ago but no more recent tick bites.  Past Medical History:  Diagnosis Date  . CHEST WALL PAIN, ACUTE 10/11/2009  . GERD (gastroesophageal reflux disease)   . GI problem   . HYPERLIPIDEMIA 07/17/2008   on medicine  . HYPERTENSION 07/17/2008  . 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 Sinus node dysfunction ([place]) 01/27/2011  . Sleep apnea    doesnt wear cpap  . SYNCOPE 07/24/2008   Past Surgical History:  Procedure Laterality Date  . COLONOSCOPY    . PACEMAKER PLACEMENT     St Jude loop  implantation 2010  . POLYPECTOMY      reports that he quit smoking about 34 years ago. His smoking use included Cigarettes. He has a 20.00 pack-year smoking history. He has never used smokeless tobacco. He reports that he drinks about 7.0 oz of alcohol per week . He reports that he does not use drugs. family history includes Alcohol abuse in his maternal aunt; Colitis in his father; Hypertension in his father and mother; Other in his father; Stroke in his mother. No Known Allergies   Review of Systems  Constitutional: Negative for chills and fever.  Respiratory: Negative for cough and shortness of breath.   Gastrointestinal: Negative for diarrhea, nausea and vomiting.  Genitourinary: Negative for dysuria.  Musculoskeletal: Negative for back pain.  Skin: Negative for rash.  Neurological: Negative for dizziness and headaches.  Psychiatric/Behavioral: Negative for confusion.       Objective:   Physical Exam  Constitutional: He appears well-developed and well-nourished.  Neck: Neck supple.  Cardiovascular: Normal rate and regular rhythm.   Pulmonary/Chest: Effort normal and breath sounds normal. No respiratory distress. He has no wheezes. He has no rales.  Abdominal: Soft. There is no tenderness.  Musculoskeletal: He exhibits no edema.  Lymphadenopathy:    He has no cervical adenopathy.  Skin: No rash noted.       Assessment:     Recent admission for reported [facility] spotted fever. He had leukopenia and thrombocytopenia and improved  promptly after changing antibiotics to doxycycline.    Plan:     -Repeat CBC and comprehensive metabolic panel. -Finish out doxycycline  -We'll look for old records for review of his hospital admission -Follow-up promptly for any fever, headaches, or other concerns.  [name] MD [facility]  Pt called back and his discharge antibiotics included Doxycycline, Azithromycin, and Atovaquone 750 po bid.  He  continues to do well with no fever.  [name] MD [facility] at [facility]

## 2016-11-26 ENCOUNTER — Other Ambulatory Visit: Payer: Self-pay

## 2016-11-26 DIAGNOSIS — D72819 Decreased white blood cell count, unspecified: Secondary | ICD-10-CM

## 2016-11-26 DIAGNOSIS — D696 Thrombocytopenia, unspecified: Secondary | ICD-10-CM

## 2016-11-26 LAB — CBC WITH DIFFERENTIAL/PLATELET
Basophils Absolute: 0.2 10*3/uL — ABNORMAL HIGH (ref 0.0–0.1)
Basophils Relative: 2.1 % (ref 0.0–3.0)
Eosinophils Absolute: 0.1 10*3/uL (ref 0.0–0.7)
Eosinophils Relative: 1.3 % (ref 0.0–5.0)
HCT: 40.1 % (ref 39.0–52.0)
Hemoglobin: 13.4 g/dL (ref 13.0–17.0)
Lymphocytes Relative: 42.6 % (ref 12.0–46.0)
Lymphs Abs: 4.2 10*3/uL — ABNORMAL HIGH (ref 0.7–4.0)
MCHC: 33.5 g/dL (ref 30.0–36.0)
MCV: 94.3 fl (ref 78.0–100.0)
Monocytes Absolute: 0.9 10*3/uL (ref 0.1–1.0)
Monocytes Relative: 9.2 % (ref 3.0–12.0)
Neutro Abs: 4.4 10*3/uL (ref 1.4–7.7)
Neutrophils Relative %: 44.8 % (ref 43.0–77.0)
Platelets: 243 10*3/uL (ref 150.0–400.0)
RBC: 4.25 Mil/uL (ref 4.22–5.81)
RDW: 14.8 % (ref 11.5–15.5)
WBC: 9.8 10*3/uL (ref 4.0–10.5)

## 2016-11-26 LAB — COMPREHENSIVE METABOLIC PANEL
ALT: 71 U/L — ABNORMAL HIGH (ref 0–53)
AST: 36 U/L (ref 0–37)
Albumin: 4 g/dL (ref 3.5–5.2)
Alkaline Phosphatase: 142 U/L — ABNORMAL HIGH (ref 39–117)
BUN: 20 mg/dL (ref 6–23)
CO2: 31 mEq/L (ref 19–32)
Calcium: 8.8 mg/dL (ref 8.4–10.5)
Chloride: 100 mEq/L (ref 96–112)
Creatinine, Ser: 1.09 mg/dL (ref 0.40–1.50)
GFR: 70.34 mL/min (ref >60.00)
Glucose, Bld: 121 mg/dL — ABNORMAL HIGH (ref 70–99)
Potassium: 4.8 mEq/L (ref 3.5–5.1)
Sodium: 136 mEq/L (ref 135–145)
Total Bilirubin: 0.7 mg/dL (ref 0.2–1.2)
Total Protein: 6.4 g/dL (ref 6.0–8.3)

## 2016-11-27 ENCOUNTER — Encounter: Payer: Self-pay | Admitting: Family Medicine

## 2016-12-22 ENCOUNTER — Encounter: Payer: Self-pay | Admitting: Family Medicine

## 2016-12-22 ENCOUNTER — Ambulatory Visit (INDEPENDENT_AMBULATORY_CARE_PROVIDER_SITE_OTHER): Payer: Medicare Other | Admitting: Family Medicine

## 2016-12-22 VITALS — BP 120/78 | HR 71 | Temp 98.3°F | Wt 174.2 lb

## 2016-12-22 DIAGNOSIS — Z23 Encounter for immunization: Secondary | ICD-10-CM

## 2016-12-22 DIAGNOSIS — I10 Essential (primary) hypertension: Secondary | ICD-10-CM | POA: Diagnosis not present

## 2016-12-22 DIAGNOSIS — R05 Cough: Secondary | ICD-10-CM | POA: Diagnosis not present

## 2016-12-22 DIAGNOSIS — E785 Hyperlipidemia, unspecified: Secondary | ICD-10-CM | POA: Diagnosis not present

## 2016-12-22 DIAGNOSIS — R059 Cough, unspecified: Secondary | ICD-10-CM

## 2016-12-22 LAB — LIPID PANEL
Cholesterol: 212 mg/dL — ABNORMAL HIGH (ref 0–200)
HDL: 58.7 mg/dL (ref >39.00)
LDL Cholesterol: 138 mg/dL — ABNORMAL HIGH (ref 0–99)
NonHDL: 153.04
Total CHOL/HDL Ratio: 4
Triglycerides: 75 mg/dL (ref 0.0–149.0)
VLDL: 15 mg/dL (ref 0.0–40.0)

## 2016-12-22 NOTE — Progress Notes (Signed)
Subjective:     Patient ID: [name] [name], male   DOB: 30-Jan-1941, 76 y.o.   MRN: [id number]  HPI Patient seen for routine medical follow-up.  Recent episode or [facility] spotted fever and patient promptly improved and recovered after starting doxycycline. He just returned from trip to [place] early Sunday morning. One-day history of cough. Nonproductive. No dyspnea. No fever. No chills. No hemoptysis.  Chronic problems include history of hyperlipidemia, hypertension, and GERD. Medications reviewed. Compliant with all. Has not had lipids in one year. Recent hepatic panel during his hospitalization for [facility] spotted fever. His CBC had returned to normal at follow-up. August 9.  Generally feels well.  Past Medical History:  Diagnosis Date  . CHEST WALL PAIN, ACUTE 10/11/2009  . GERD (gastroesophageal reflux disease)   . GI problem   . HYPERLIPIDEMIA 07/17/2008   on medicine  . HYPERTENSION 07/17/2008  . 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 Sinus node dysfunction ([place]) 01/27/2011  . Sleep apnea    doesnt wear cpap  . SYNCOPE 07/24/2008   Past Surgical History:  Procedure Laterality Date  . COLONOSCOPY    . PACEMAKER PLACEMENT     St Jude loop implantation 2010  . POLYPECTOMY      reports that he quit smoking about 34 years ago. His smoking use included Cigarettes. He has a 20.00 pack-year smoking history. He has never used smokeless tobacco. He reports that he drinks about 7.0 oz of alcohol per week . He reports that he does not use drugs. family history includes Alcohol abuse in his maternal aunt; Colitis in his father; Hypertension in his father and mother; Other in his father; Stroke in his mother. No Known Allergies   Review of Systems  Constitutional: Negative for fatigue.  Eyes: Negative for visual disturbance.  Respiratory: Positive for cough. Negative for chest tightness, shortness of breath and wheezing.   Cardiovascular: Negative for chest pain, palpitations and leg  swelling.  Neurological: Negative for dizziness, syncope, weakness, light-headedness and headaches.       Objective:   Physical Exam  Constitutional: He is oriented to person, place, and time. He appears well-developed and well-nourished.  HENT:  Right Ear: External ear normal.  Left Ear: External ear normal.  Mouth/Throat: Oropharynx is clear and moist.  Eyes: Pupils are equal, round, and reactive to light.  Neck: Neck supple. No thyromegaly present.  Cardiovascular: Normal rate and regular rhythm.   Pulmonary/Chest: Effort normal and breath sounds normal. No respiratory distress. He has no wheezes. He has no rales.  Musculoskeletal: He exhibits no edema.  Neurological: He is alert and oriented to person, place, and time.       Assessment:     #1 hypertension stable and at goal  #2 hyperlipidemia  #3 dry cough-suspect viral. Nonfocal exam  #4 recent [facility] spotted fever    Plan:     -Observation regarding his cough. Follow-up promptly for any fever or change of symptoms -Check lipid panel -Continue current medications -Set up Medicare annual wellness visit  [name] MD [facility] at [facility]

## 2016-12-22 NOTE — Patient Instructions (Addendum)
Set up Medicare Annual Wellness visit.

## 2016-12-31 ENCOUNTER — Ambulatory Visit (INDEPENDENT_AMBULATORY_CARE_PROVIDER_SITE_OTHER): Payer: Medicare Other | Admitting: *Deleted

## 2016-12-31 DIAGNOSIS — I495 Sick sinus syndrome: Secondary | ICD-10-CM | POA: Diagnosis not present

## 2016-12-31 NOTE — Progress Notes (Signed)
Remote pacemaker transmission.   

## 2017-01-01 ENCOUNTER — Encounter: Payer: Self-pay | Admitting: Cardiology

## 2017-01-07 ENCOUNTER — Encounter: Payer: Self-pay | Admitting: Family Medicine

## 2017-01-08 LAB — CUP PACEART REMOTE DEVICE CHECK
Battery Remaining Longevity: 116 mo
Battery Remaining Percentage: 81 %
Battery Voltage: 2.93 V
Brady Statistic AP VP Percent: 1 %
Brady Statistic AP VS Percent: 1 %
Brady Statistic AS VP Percent: 1 %
Brady Statistic AS VS Percent: 99 %
Brady Statistic RA Percent Paced: 1 %
Brady Statistic RV Percent Paced: 1 %
Date Time Interrogation Session: 20180913113755
Implantable Lead Implant Date: 20111010
Implantable Lead Implant Date: 20111010
Implantable Lead Location: 753859
Implantable Lead Location: 753860
Implantable Pulse Generator Implant Date: 20111010
Lead Channel Impedance Value: 310 Ohm
Lead Channel Impedance Value: 330 Ohm
Lead Channel Pacing Threshold Amplitude: 0.75 V
Lead Channel Pacing Threshold Amplitude: 1.25 V
Lead Channel Pacing Threshold Pulse Width: 0.4 ms
Lead Channel Pacing Threshold Pulse Width: 0.4 ms
Lead Channel Sensing Intrinsic Amplitude: 2.5 mV
Lead Channel Sensing Intrinsic Amplitude: 5.4 mV
Lead Channel Setting Pacing Amplitude: 1.5 V
Lead Channel Setting Pacing Amplitude: 1.75 V
Lead Channel Setting Pacing Pulse Width: 0.4 ms
Lead Channel Setting Sensing Sensitivity: 2 mV
Pulse Gen Model: 2110
Pulse Gen Serial Number: 7156802

## 2017-01-12 ENCOUNTER — Ambulatory Visit (INDEPENDENT_AMBULATORY_CARE_PROVIDER_SITE_OTHER): Payer: Medicare Other

## 2017-01-12 VITALS — BP 122/80 | HR 71 | Ht 67.0 in | Wt 175.0 lb

## 2017-01-12 DIAGNOSIS — Z0000 Encounter for general adult medical examination without abnormal findings: Secondary | ICD-10-CM

## 2017-01-12 NOTE — Patient Instructions (Addendum)
[name] , Thank you for taking time to come for your Medicare Wellness Visit. I appreciate your ongoing commitment to your health goals. Please review the following plan we discussed and let me know if I can assist you in the future.   Check with the [place] regarding your pneumonia vaccinations and dates.  The Centers for Disease Control are now recommending 2 pneumonia vaccinations after 28. The first is the Prevnar 13. This helps to boost your immunity to community acquired pneumonia as well as some protection from bacterial pneumonia  The 2nd is the pneumovax 23, which offers more broad protection!  ( we have  A date of 04/21/2007)  Please consider taking these as this is your best protection against pneumonia.  Shingrix is a vaccine for the prevention of Shingles in Adults 50 and older.  If you are on Medicare, you can request a prescription from your doctor to be filled at a pharmacy.  Please check with your benefits regarding applicable copays or out of pocket expenses.  The Shingrix is given in 2 vaccines approx 8 weeks apart. You must receive the 2nd dose prior to 6 months from receipt of the first.      These are the goals we discussed: Goals    . patient          Maintain health and curtail ETOH to 2 per day or less        This is a list of the screening recommended for you and due dates:  Health Maintenance  Topic Date Due  . Pneumonia vaccines (2 of 2 - PPSV23) 01/15/2018*  . Tetanus Vaccine  11/02/2017  . Colon Cancer Screening  09/19/2019  . Flu Shot  Completed  *Topic was postponed. The date shown is not the original due date.     Prevention of falls: Remove rugs or any tripping hazards in the home Use Non slip mats in bathtubs and showers Placing grab bars next to the toilet and or shower Placing handrails on both sides of the stair way Adding extra lighting in the home.   Personal safety issues reviewed:  1. Consider starting a community watch program per  [facility] 2.  Changes batteries is smoke detector and/or carbon monoxide detector  3.  If you have firearms; keep them in a safe place 4.  Wear protection when in the sun; Always wear sunscreen or a hat; It is good to have your doctor check your skin annually or review any new areas of concern 5. Driving safety; Keep in the right lane; stay 3 car lengths behind the car in front of you on the highway; look 3 times prior to pulling out; carry your cell phone everywhere you go!    Learn about the Yellow Dot program:  The program allows first responders at your emergency to have access to who your physician is, as well as your medications and medical conditions.  Citizens requesting the Yellow Dot Packages should contact Master Corporal Nunzio Cobbs at the [facility] [phone] for the first week of the program and beginning the week after Easter citizens should contact their Scientist, physiological.       Fall Prevention in the Home Falls can cause injuries. They can happen to people of all ages. There are many things you can do to make your home safe and to help prevent falls. What can I do on the outside of my home?  Regularly fix the edges of  walkways and driveways and fix any cracks.  Remove anything that might make you trip as you walk through a door, such as a raised step or threshold.  Trim any bushes or trees on the path to your home.  Use bright outdoor lighting.  Clear any walking paths of anything that might make someone trip, such as rocks or tools.  Regularly check to see if handrails are loose or broken. Make sure that both sides of any steps have handrails.  Any raised decks and porches should have guardrails on the edges.  Have any leaves, snow, or ice cleared regularly.  Use sand or salt on walking paths during winter.  Clean up any spills in your garage right away. This includes oil or grease spills. What can I do in  the bathroom?  Use night lights.  Install grab bars by the toilet and in the tub and shower. Do not use towel bars as grab bars.  Use non-skid mats or decals in the tub or shower.  If you need to sit down in the shower, use a plastic, non-slip stool.  Keep the floor dry. Clean up any water that spills on the floor as soon as it happens.  Remove soap buildup in the tub or shower regularly.  Attach bath mats securely with double-sided non-slip rug tape.  Do not have throw rugs and other things on the floor that can make you trip. What can I do in the bedroom?  Use night lights.  Make sure that you have a light by your bed that is easy to reach.  Do not use any sheets or blankets that are too big for your bed. They should not hang down onto the floor.  Have a firm chair that has side arms. You can use this for support while you get dressed.  Do not have throw rugs and other things on the floor that can make you trip. What can I do in the kitchen?  Clean up any spills right away.  Avoid walking on wet floors.  Keep items that you use a lot in easy-to-reach places.  If you need to reach something above you, use a strong step stool that has a grab bar.  Keep electrical cords out of the way.  Do not use floor polish or wax that makes floors slippery. If you must use wax, use non-skid floor wax.  Do not have throw rugs and other things on the floor that can make you trip. What can I do with my stairs?  Do not leave any items on the stairs.  Make sure that there are handrails on both sides of the stairs and use them. Fix handrails that are broken or loose. Make sure that handrails are as long as the stairways.  Check any carpeting to make sure that it is firmly attached to the stairs. Fix any carpet that is loose or worn.  Avoid having throw rugs at the top or bottom of the stairs. If you do have throw rugs, attach them to the floor with carpet tape.  Make sure that you  have a light switch at the top of the stairs and the bottom of the stairs. If you do not have them, ask someone to add them for you. What else can I do to help prevent falls?  Wear shoes that: ? Do not have high heels. ? Have rubber bottoms. ? Are comfortable and fit you well. ? Are closed at the toe. Do not wear  sandals.  If you use a stepladder: ? Make sure that it is fully opened. Do not climb a closed stepladder. ? Make sure that both sides of the stepladder are locked into place. ? Ask someone to hold it for you, if possible.  Clearly mark and make sure that you can see: ? Any grab bars or handrails. ? First and last steps. ? Where the edge of each step is.  Use tools that help you move around (mobility aids) if they are needed. These include: ? Canes. ? Walkers. ? Scooters. ? Crutches.  Turn on the lights when you go into a dark area. Replace any light bulbs as soon as they burn out.  Set up your furniture so you have a clear path. Avoid moving your furniture around.  If any of your floors are uneven, fix them.  If there are any pets around you, be aware of where they are.  Review your medicines with your doctor. Some medicines can make you feel dizzy. This can increase your chance of falling. Ask your doctor what other things that you can do to help prevent falls. This information is not intended to replace advice given to you by your health care provider. Make sure you discuss any questions you have with your health care provider. Document Released: 01/31/2009 Document Revised: 09/12/2015 Document Reviewed: 05/11/2014 Elsevier Interactive Patient Education  2018 [employer] Maintenance, Male A healthy lifestyle and preventive care is important for your health and wellness. Ask your health care provider about what schedule of regular examinations is right for you. What should I know about weight and diet? Eat a Healthy Diet  Eat plenty of vegetables,  fruits, whole grains, low-fat dairy products, and lean protein.  Do not eat a lot of foods high in solid fats, added sugars, or salt.  Maintain a Healthy Weight Regular exercise can help you achieve or maintain a healthy weight. You should:  Do at least 150 minutes of exercise each week. The exercise should increase your heart rate and make you sweat (moderate-intensity exercise).  Do strength-training exercises at least twice a week.  Watch Your Levels of Cholesterol and Blood Lipids  Have your blood tested for lipids and cholesterol every 5 years starting at 74 years of age. If you are at high risk for heart disease, you should start having your blood tested when you are 74 years old. You may need to have your cholesterol levels checked more often if: ? Your lipid or cholesterol levels are high. ? You are older than 74 years of age. ? You are at high risk for heart disease.  What should I know about cancer screening? Many types of cancers can be detected early and may often be prevented. Lung Cancer  You should be screened every year for lung cancer if: ? You are a current smoker who has smoked for at least 30 years. ? You are a former smoker who has quit within the past 15 years.  Talk to your health care provider about your screening options, when you should start screening, and how often you should be screened.  Colorectal Cancer  Routine colorectal cancer screening usually begins at 74 years of age and should be repeated every 5-10 years until you are 74 years old. You may need to be screened more often if early forms of precancerous polyps or small growths are found. Your health care provider may recommend screening at an earlier age if you have risk  factors for colon cancer.  Your health care provider may recommend using home test kits to check for hidden blood in the stool.  A small camera at the end of a tube can be used to examine your colon (sigmoidoscopy or  colonoscopy). This checks for the earliest forms of colorectal cancer.  Prostate and Testicular Cancer  Depending on your age and overall health, your health care provider may do certain tests to screen for prostate and testicular cancer.  Talk to your health care provider about any symptoms or concerns you have about testicular or prostate cancer.  Skin Cancer  Check your skin from head to toe regularly.  Tell your health care provider about any new moles or changes in moles, especially if: ? There is a change in a mole's size, shape, or color. ? You have a mole that is larger than a pencil eraser.  Always use sunscreen. Apply sunscreen liberally and repeat throughout the day.  Protect yourself by wearing long sleeves, pants, a wide-brimmed hat, and sunglasses when outside.  What should I know about heart disease, diabetes, and high blood pressure?  If you are 46-59 years of age, have your blood pressure checked every 3-5 years. If you are 37 years of age or older, have your blood pressure checked every year. You should have your blood pressure measured twice-once when you are at a hospital or clinic, and once when you are not at a hospital or clinic. Record the average of the two measurements. To check your blood pressure when you are not at a hospital or clinic, you can use: ? An automated blood pressure machine at a pharmacy. ? A home blood pressure monitor.  Talk to your health care provider about your target blood pressure.  If you are between 58-8 years old, ask your health care provider if you should take aspirin to prevent heart disease.  Have regular diabetes screenings by checking your fasting blood sugar level. ? If you are at a normal weight and have a low risk for diabetes, have this test once every three years after the age of 38. ? If you are overweight and have a high risk for diabetes, consider being tested at a younger age or more often.  A one-time screening for  abdominal aortic aneurysm (AAA) by ultrasound is recommended for men aged 53-75 years who are current or former smokers. What should I know about preventing infection? Hepatitis B If you have a higher risk for hepatitis B, you should be screened for this virus. Talk with your health care provider to find out if you are at risk for hepatitis B infection. Hepatitis C Blood testing is recommended for:  Everyone born from [birth year] through 1965.  Anyone with known risk factors for hepatitis C.  Sexually Transmitted Diseases (STDs)  You should be screened each year for STDs including gonorrhea and chlamydia if: ? You are sexually active and are younger than 74 years of age. ? You are older than 74 years of age and your health care provider tells you that you are at risk for this type of infection. ? Your sexual activity has changed since you were last screened and you are at an increased risk for chlamydia or gonorrhea. Ask your health care provider if you are at risk.  Talk with your health care provider about whether you are at high risk of being infected with HIV. Your health care provider may recommend a prescription medicine to help prevent HIV infection.  What else can I do?  Schedule regular health, dental, and eye exams.  Stay current with your vaccines (immunizations).  Do not use any tobacco products, such as cigarettes, chewing tobacco, and e-cigarettes. If you need help quitting, ask your health care provider.  Limit alcohol intake to no more than 2 drinks per day. One drink equals 12 ounces of beer, 5 ounces of wine, or 1 ounces of hard liquor.  Do not use street drugs.  Do not share needles.  Ask your health care provider for help if you need support or information about quitting drugs.  Tell your health care provider if you often feel depressed.  Tell your health care provider if you have ever been abused or do not feel safe at home. This information is not intended to  replace advice given to you by your health care provider. Make sure you discuss any questions you have with your health care provider. Document Released: 10/03/2007 Document Revised: 12/04/2015 Document Reviewed: 01/08/2015 Elsevier Interactive Patient Education  [employer].

## 2017-01-12 NOTE — Progress Notes (Addendum)
Subjective:   [name] [name] is a 76 y.o. male who presents for Medicare Annual/Subsequent preventive examination.  The Patient was informed that the wellness visit is to identify future health risk and educate and initiate measures that can reduce risk for increased disease through the lifespan.    Annual Wellness Assessment  Reports health as  Recent episode of RMSF - now resolved  Has twin grand-dtr in [place] and was visiting them.   Had found tick in his left arm a couple of days prior to his trip  Preventive Screening -Counseling & Management  Medicare Annual Preventive Care Visit - Subsequent Last OV 12/22/2016  There are no preventive care reminders to display for this patient. Note that is not specific as to when he rec'd [facility] 23; 2009;  BD in November and will be 76; could have had prior to [place] he feels the [place] gave him a pneumonia vaccine so he will check there   [place] as poor, fair, good or great? 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 Satira Sark; gingivitis   Stressors: doing well;   Sleep patterns: sleeps well  Was taking diuretic; melatonin at hs   Pain some Shoulder pain OA  Fell and broken collar bone; 76 yo     Cardiac Risk Factors Addressed Hyperlipidemia - chol 212    Advanced Directives - yes   Patient Care Team: [name], MD as PCP - General  Dr. Ronnald Ramp dermatology GI at [facility] doctor at the [facility]  Dr. Jens Som; pacemaker     Cardiac Risk Factors include: advanced age (>84men, >19 women);dyslipidemia;hypertension;male gender     Objective:    Vitals: BP 122/80   Pulse 71   Ht 5\' 7"   (1.702 m)   Wt 175 lb (79.4 kg)   SpO2 96%   BMI 27.41 kg/m   Body mass index is 27.41 kg/m.  Tobacco History  Smoking Status  . Former Smoker  . Packs/day: 1.00  . Years: 20.00  . Types: Cigarettes  . Quit date: 08/27/1982  Smokeless Tobacco  . Never Used     Counseling given: Yes   Past Medical History:  Diagnosis Date  . CHEST WALL PAIN, ACUTE 10/11/2009  . GERD (gastroesophageal reflux disease)   . GI problem   . HYPERLIPIDEMIA 07/17/2008   on medicine  . HYPERTENSION 07/17/2008  . 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 Sinus node dysfunction ([place]) 01/27/2011  . Sleep apnea    doesnt wear cpap  . SYNCOPE 07/24/2008   Past Surgical History:  Procedure Laterality Date  . COLONOSCOPY    . PACEMAKER PLACEMENT     St Jude loop implantation 2010  . POLYPECTOMY     Family History  Problem Relation Age of Onset  . Hypertension Mother   . Stroke Mother   . Colitis Father   . Hypertension Father   . Other Father        esophageal stricture  . Alcohol abuse Maternal Aunt   . Colon cancer Neg Hx   . Rectal cancer Neg Hx   . Stomach  cancer Neg Hx   . Esophageal cancer Neg Hx    History  Sexual Activity  . Sexual activity: Not on file    Outpatient Encounter Prescriptions as of 01/12/2017  Medication Sig  . atorvastatin (LIPITOR) 10 MG tablet Take 2.5 mg by mouth daily.  . benazepril (LOTENSIN) 20 MG tablet Take 1 tablet (20 mg total) by mouth daily.  Marland Kitchen omeprazole (PRILOSEC) 20 MG capsule Take 20 mg by mouth daily.   No facility-administered encounter medications on file as of 01/12/2017.     Activities of Daily Living In your present state of health, do you have any difficulty performing the following activities: 01/12/2017  Hearing? N  Vision? N  Difficulty concentrating or making decisions? N  Walking or climbing stairs? N  Dressing or bathing? N  Doing errands, shopping? N  Preparing Food and eating ? N  Using the Toilet? N  Comment up at hs   In the past six  months, have you accidently leaked urine? N  Do you have problems with loss of bowel control? N  Managing your Medications? N  Managing your Finances? N  Housekeeping or managing your Housekeeping? N  Some recent data might be hidden    Patient Care Team: [name], MD as PCP - General   Assessment:     Exercise Activities and Dietary recommendations Current Exercise Habits: Home exercise routine, Time (Minutes): > 60, Frequency (Times/Week): 5, Weekly Exercise (Minutes/Week): 0, Intensity: Moderate  Goals    . patient          Maintain health and curtail ETOH to 2 per day or less       Fall Risk Fall Risk  01/12/2017 03/27/2016 01/04/2015 09/01/2013 08/31/2013  Falls in the past year? No No No No No  Comment - Emmi Telephone Survey: data to providers prior to load - - -   Depression Screen PHQ 2/9 Scores 01/12/2017 01/04/2015 09/01/2013 08/31/2013  PHQ - 2 Score 0 0 0 0    Cognitive Function Ad8 score reviewed for issues:  Issues making decisions:  Less interest in hobbies / activities:  Repeats questions, stories (family complaining):  Trouble using ordinary gadgets (microwave, computer, phone):  Forgets the month or year:   Mismanaging finances:   Remembering appts:  Daily problems with thinking and/or memory: Ad8 score is=0 Mother had alzheimer's - in her 16's          Immunization History  Administered Date(s) Administered  . Influenza Split 03/02/2011  . Influenza, High Dose Seasonal PF 12/22/2016  . Influenza,inj,Quad PF,6+ Mos 01/04/2015  . Influenza-Unspecified 12/19/2012, 01/17/2014  . Pneumococcal Conjugate-13 08/31/2013  . Pneumococcal Polysaccharide-23 04/21/2007  . Td 05/21/2006, 11/03/2007   Screening Tests Health Maintenance  Topic Date Due  . PNA vac Low Risk Adult (2 of 2 - PPSV23) 01/15/2018 (Originally 09/01/2014)  . TETANUS/TDAP  11/02/2017  . COLONOSCOPY  09/19/2019  . INFLUENZA VACCINE  Completed      Plan:     PCP  Notes   Health Maintenance Had zoster but not documented Educated regarding Shingrix vaccinations  Discussed PSV 23; thinks he had one at the [place] and will check   Abnormal Screens  None  Referrals  none  Patient concerns; none  Nurse Concerns; none  Next PCP apt TBS just seen 12/22/16      I have personally reviewed and noted the following in the patient's chart:   . Medical and social history . Use of alcohol, tobacco or illicit  drugs  . Current medications and supplements . Functional ability and status . Nutritional status . Physical activity . Advanced directives . List of other physicians . Hospitalizations, surgeries, and ER visits in previous 12 months . Vitals . Screenings to include cognitive, depression, and falls . Referrals and appointments  In addition, I have reviewed and discussed with patient certain preventive protocols, quality metrics, and best practice recommendations. A written personalized care plan for preventive services as well as general preventive health recommendations were provided to patient.     VAPOL,IDCVU, RN  01/12/2017  Agree with assesement as above.  [name] MD [facility] at [facility]

## 2017-02-08 ENCOUNTER — Encounter: Payer: Self-pay | Admitting: Family Medicine

## 2017-02-12 ENCOUNTER — Ambulatory Visit (INDEPENDENT_AMBULATORY_CARE_PROVIDER_SITE_OTHER): Payer: Medicare Other | Admitting: Family Medicine

## 2017-02-12 ENCOUNTER — Encounter: Payer: Self-pay | Admitting: Family Medicine

## 2017-02-12 VITALS — BP 130/80 | HR 64 | Temp 98.0°F | Wt 179.4 lb

## 2017-02-12 DIAGNOSIS — Z7184 Encounter for health counseling related to travel: Secondary | ICD-10-CM

## 2017-02-12 DIAGNOSIS — Z7189 Other specified counseling: Secondary | ICD-10-CM

## 2017-02-12 DIAGNOSIS — Z23 Encounter for immunization: Secondary | ICD-10-CM | POA: Diagnosis not present

## 2017-02-12 MED ORDER — TYPHOID VACCINE PO CPDR: 1.0000 | DELAYED_RELEASE_CAPSULE | ORAL | 0 refills | Status: DC

## 2017-02-12 NOTE — Patient Instructions (Signed)
Food Poisoning and Traveling Food poisoning is an illness caused by organisms present in something you ate or drank. Some types of food poisoning trigger symptoms quickly. Others may take 1-2 weeks for symptoms to appear. Symptoms of food poisoning include:  Diarrhea.  Cramping.  Fever.  Vomiting.  Dizziness.  Aches and pains.  Before you travel, learn as much as you can about the foodborne illnesses that are common in the areas where you are going. The risk for food poisoning varies from country to country and from one region of the world to another. Countries with a low risk include:  The [place].  [place].  [place].  [place].  [place].  Some countries in [place].  Countries with a mid-range risk include:  Countries in Eastern [place].  [place].  Some [place] islands.  Countries with a high risk include:  Countries in Asia.  Countries in the [place].  Countries in [place].  [place].  Countries in [place] and [place].  What types of illness can be passed through food and drinks? Most cases of food poisoning are caused by bacteria or viruses, such as:  E. coli.  Campylobacteriosis.  Shigellosis.  Salmonellosis.  Norovirus.  Rotavirus.  Astrovirus.  Food poisoning can also be caused by some microscopic parasites. These are organisms that live off of another larger organism. Illness caused by parasites can take 1-2 weeks to appear and may last several months. The illnesses include:  Giardiasis.  Amebiasis.  Cyclosporiasis.  Cryptosporidiosis.  Medicines are available to treat these infections. How can I decrease my risk of food poisoning while traveling? Good hand hygiene always helps protect your health. Carry small bottles of alcohol-based hand sanitizer. Use it to clean your hands before you eat. Also follow these basic guidelines for eating and drinking while traveling. Foods that are generally safe to  eat:  Food that is thoroughly cooked.  Food that is served hot.  Hard-boiled eggs.  Fruits and vegetables you wash and peel yourself.  Milk or cheese that is treated with high heat (pasteurized).  Foods to avoid:  Raw or undercooked foods.  Raw or runny eggs.  Food that is not hot (such as food that has been on a buffet or picnic table for a while).  Raw fruits or vegetables that have not been washed and peeled.  Other items made with fresh vegetables or fruits, like salad and salsa.  Milk or cheese that is not pasteurized.  Meat from local animals, such as monkeys and bats.  Drinks that are generally safe:  Bottled waters, soda, or sports drinks.  Drinks you know were sealed until you opened them.  Water you know has been treated, boiled, or filtered to remove microorganisms.  Ice from treated or bottled water.  Drinks made with boiling water, such as tea or coffee.  Pasteurized milk.  Drinks to avoid:  Water from the tap or a well.  Water from a fresh water source, such as a stream.  Ice from a tap, well, or fresh water source.  Beverages that include water from a well, tap, or fresh water source.  Milk that is not pasteurized.  Beverages from soda fountains.  What should I do if I think I have developed food poisoning while traveling?  If you have been vomiting or have diarrhea, drink water or other fluids to replace what you lost.  Take over-the-counter medicine to stop diarrhea. Consider packing a supply of antidiarrheal medicine to take with you.    Contact your health care provider if your symptoms do not clear up after a few days. How is food poisoning treated? Most cases of food poisoning go away without treatment within 48 hours. Food poisoning caused by bacteria may be treated with antibiotic medicines.Viruses cannot be treated with antibiotics.Illnesses caused by parasites might respond to antiparasitic medicines. You can also treat symptoms  of food poisoning with medicines to:  Prevent or slow diarrhea (antidiarrheals).  Rehydrate your body. Oral rehydration therapy (ORT) helps your body replace fluids and electrolytes lost in diarrhea or vomit.  Treat rashes caused by diarrhea using hydrocortisone cream.  Should I be proactively treated for food poisoning before I travel? Talk to your health care provider about your personal risk for contracting food poisoning while traveling. Discuss the foodborne illnesses that are common in the areas you plan to visit. Make sure you understand how to use any medicines your health care provider recommends. Some medicines can help prevent food poisoning. These include:  Bismuth subsalicylate (BSS). This is an ingredient in over-the-counter antidiarrheal medicines. It might help some adult travelers prevent illness.  Probiotics or "good" bacteria. Taking probiotics might help prevent some illnesses.  Antibiotics. These protect against bacteria only. Taking antibiotics to prevent food poisoning is usually suggested only for people who have a compromised immune system.  How can I learn more?  Visit a travel medicine clinic or speak with a health care provider who specializes in travel medicine as soon as you know your travel plans.  Check the Travelers' Health section on the website of the Centers for Disease Control and Prevention (CDC): http://wwwnc.cdc.gov/travel This information is not intended to replace advice given to you by your health care provider. Make sure you discuss any questions you have with your health care provider. Document Released: 06/27/2002 Document Revised: 09/03/2015 Document Reviewed: 07/14/2013 Elsevier Interactive Patient Education  2018 Elsevier Inc.  

## 2017-02-12 NOTE — Progress Notes (Signed)
Subjective:     Patient ID: [name] [name], male   DOB: 1940-05-14, 76 y.o.   MRN: [id number]  HPI Patient seen for travel advice encounter. He plans to travel with his wife to the [place] around end of November.  Centers for Disease Control recommendations include hepatitis A and typhoid vaccination. He has not had either of these previously. His tetanus will run out next year. He is already had flu vaccination.  Regular medications reviewed. His blood pressures been stable.  Past Medical History:  Diagnosis Date  . CHEST WALL PAIN, ACUTE 10/11/2009  . GERD (gastroesophageal reflux disease)   . GI problem   . HYPERLIPIDEMIA 07/17/2008   on medicine  . HYPERTENSION 07/17/2008  . 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 Sinus node dysfunction ([place]) 01/27/2011  . Sleep apnea    doesnt wear cpap  . SYNCOPE 07/24/2008   Past Surgical History:  Procedure Laterality Date  . COLONOSCOPY    . PACEMAKER PLACEMENT     St Jude loop implantation 2010  . POLYPECTOMY      reports that he quit smoking about 34 years ago. His smoking use included Cigarettes. He has a 20.00 pack-year smoking history. He has never used smokeless tobacco. He reports that he drinks about 7.0 oz of alcohol per week . He reports that he does not use drugs. family history includes Alcohol abuse in his maternal aunt; Colitis in his father; Hypertension in his father and mother; Other in his father; Stroke in his mother. No Known Allergies   Review of Systems  Constitutional: Negative for fatigue.  Eyes: Negative for visual disturbance.  Respiratory: Negative for cough, chest tightness and shortness of breath.   Cardiovascular: Negative for chest pain, palpitations and leg swelling.  Neurological: Negative for dizziness, syncope, weakness, light-headedness and headaches.       Objective:   Physical Exam  Constitutional: He appears well-developed and well-nourished.  Cardiovascular: Normal rate and regular rhythm.    Pulmonary/Chest: Effort normal and breath sounds normal. No respiratory distress. He has no wheezes. He has no rales.  Musculoskeletal: He exhibits no edema.       Assessment:     Travel advice encounter    Plan:     -We recommended hepatitis A vaccination followed by booster in 6 months -Discussed typhoid vaccination with options of shot through health department versus oral and he prefers the latter.  Vivotif-take 1 every other day for 4 doses.  Avoid concomitant use of antibiotics -Handout given on food and water safety with travel  [name] MD [facility]

## 2017-02-12 NOTE — Addendum Note (Signed)
Addended by: Westley Hummer B on: 02/12/2017 12:25 PM   Modules accepted: Orders

## 2017-03-25 ENCOUNTER — Other Ambulatory Visit: Payer: Self-pay

## 2017-03-30 DIAGNOSIS — D692 Other nonthrombocytopenic purpura: Secondary | ICD-10-CM | POA: Diagnosis not present

## 2017-03-30 DIAGNOSIS — L821 Other seborrheic keratosis: Secondary | ICD-10-CM | POA: Diagnosis not present

## 2017-03-30 DIAGNOSIS — L570 Actinic keratosis: Secondary | ICD-10-CM | POA: Diagnosis not present

## 2017-03-30 DIAGNOSIS — Z85828 Personal history of other malignant neoplasm of skin: Secondary | ICD-10-CM | POA: Diagnosis not present

## 2017-04-01 ENCOUNTER — Ambulatory Visit (INDEPENDENT_AMBULATORY_CARE_PROVIDER_SITE_OTHER): Payer: Medicare Other | Admitting: *Deleted

## 2017-04-01 ENCOUNTER — Telehealth: Payer: Self-pay | Admitting: Cardiology

## 2017-04-01 DIAGNOSIS — I495 Sick sinus syndrome: Secondary | ICD-10-CM | POA: Diagnosis not present

## 2017-04-01 NOTE — Progress Notes (Signed)
Remote pacemaker transmission.   

## 2017-04-01 NOTE — Telephone Encounter (Signed)
Spoke with pt and reminded pt of remote transmission that is due today. Pt verbalized understanding.   
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
## 2017-04-06 ENCOUNTER — Encounter: Payer: Self-pay | Admitting: Cardiology

## 2017-04-06 ENCOUNTER — Ambulatory Visit (INDEPENDENT_AMBULATORY_CARE_PROVIDER_SITE_OTHER): Payer: Medicare Other | Admitting: Nurse Practitioner

## 2017-04-06 ENCOUNTER — Encounter: Payer: Self-pay | Admitting: Nurse Practitioner

## 2017-04-06 VITALS — BP 156/73 | HR 60 | Ht 67.0 in | Wt 179.0 lb

## 2017-04-06 DIAGNOSIS — I10 Essential (primary) hypertension: Secondary | ICD-10-CM | POA: Diagnosis not present

## 2017-04-06 DIAGNOSIS — I495 Sick sinus syndrome: Secondary | ICD-10-CM

## 2017-04-06 LAB — CUP PACEART INCLINIC DEVICE CHECK
Date Time Interrogation Session: 20181218081328
Implantable Lead Implant Date: 20111010
Implantable Lead Implant Date: 20111010
Implantable Lead Location: 753859
Implantable Lead Location: 753860
Implantable Pulse Generator Implant Date: 20111010
Pulse Gen Model: 2110
Pulse Gen Serial Number: 7156802

## 2017-04-06 NOTE — Patient Instructions (Addendum)
Medication Instructions:    Your physician recommends that you continue on your current medications as directed. Please refer to the Current Medication list given to you today.   If you need a refill on your cardiac medications before your next appointment, please call your pharmacy.  Labwork: NONE ORDERED  TODAY    Testing/Procedures: NONE ORDERED  TODAY    Follow-Up:  Your physician wants you to follow-up in: [place] will receive a reminder letter in the mail two months in advance. If you don't receive a letter, please call our office to schedule the follow-up appointment.  Remote monitoring is used to monitor your Pacemaker of ICD from home. This monitoring reduces the number of office visits required to check your device to one time per year. It allows [place] to keep an eye on the functioning of your device to ensure it is working properly. You are scheduled for a device check from home in  06-2017 You may send your transmission at any time that day. If you have a wireless device, the transmission will be sent automatically. After your physician reviews your transmission, you will receive a postcard with your next transmission date.     Any Other Special Instructions Will Be Listed Below (If Applicable).

## 2017-04-07 ENCOUNTER — Encounter: Payer: Medicare Other | Admitting: Nurse Practitioner

## 2017-04-23 LAB — CUP PACEART REMOTE DEVICE CHECK
Battery Remaining Longevity: 115 mo
Battery Remaining Percentage: 81 %
Battery Voltage: 2.93 V
Brady Statistic AP VP Percent: 1 %
Brady Statistic AP VS Percent: 1 %
Brady Statistic AS VP Percent: 1 %
Brady Statistic AS VS Percent: 99 %
Brady Statistic RA Percent Paced: 1 %
Brady Statistic RV Percent Paced: 1 %
Date Time Interrogation Session: 20181213162301
Implantable Lead Implant Date: 20111010
Implantable Lead Implant Date: 20111010
Implantable Lead Location: 753859
Implantable Lead Location: 753860
Implantable Pulse Generator Implant Date: 20111010
Lead Channel Impedance Value: 300 Ohm
Lead Channel Impedance Value: 300 Ohm
Lead Channel Pacing Threshold Amplitude: 0.75 V
Lead Channel Pacing Threshold Amplitude: 0.875 V
Lead Channel Pacing Threshold Pulse Width: 0.4 ms
Lead Channel Pacing Threshold Pulse Width: 0.4 ms
Lead Channel Sensing Intrinsic Amplitude: 2.9 mV
Lead Channel Sensing Intrinsic Amplitude: 7.6 mV
Lead Channel Setting Pacing Amplitude: 1.125
Lead Channel Setting Pacing Amplitude: 1.75 V
Lead Channel Setting Pacing Pulse Width: 0.4 ms
Lead Channel Setting Sensing Sensitivity: 2 mV
Pulse Gen Model: 2110
Pulse Gen Serial Number: 7156802

## 2017-06-28 DIAGNOSIS — D485 Neoplasm of uncertain behavior of skin: Secondary | ICD-10-CM | POA: Diagnosis not present

## 2017-06-28 DIAGNOSIS — C4441 Basal cell carcinoma of skin of scalp and neck: Secondary | ICD-10-CM | POA: Diagnosis not present

## 2017-06-28 DIAGNOSIS — Z85828 Personal history of other malignant neoplasm of skin: Secondary | ICD-10-CM | POA: Diagnosis not present

## 2017-07-01 ENCOUNTER — Ambulatory Visit (INDEPENDENT_AMBULATORY_CARE_PROVIDER_SITE_OTHER): Payer: Medicare HMO | Admitting: *Deleted

## 2017-07-01 DIAGNOSIS — I495 Sick sinus syndrome: Secondary | ICD-10-CM

## 2017-07-01 NOTE — Progress Notes (Signed)
Remote pacemaker transmission.   

## 2017-07-02 ENCOUNTER — Encounter: Payer: Self-pay | Admitting: Cardiology

## 2017-07-18 LAB — CUP PACEART REMOTE DEVICE CHECK
Battery Remaining Longevity: 118 mo
Battery Remaining Percentage: 81 %
Battery Voltage: 2.93 V
Brady Statistic AP VP Percent: 1 %
Brady Statistic AP VS Percent: 1 %
Brady Statistic AS VP Percent: 1 %
Brady Statistic AS VS Percent: 99 %
Brady Statistic RA Percent Paced: 1 %
Brady Statistic RV Percent Paced: 1 %
Date Time Interrogation Session: 20190314144440
Implantable Lead Implant Date: 20111010
Implantable Lead Implant Date: 20111010
Implantable Lead Location: 753859
Implantable Lead Location: 753860
Implantable Pulse Generator Implant Date: 20111010
Lead Channel Impedance Value: 330 Ohm
Lead Channel Impedance Value: 340 Ohm
Lead Channel Pacing Threshold Amplitude: 0.625 V
Lead Channel Pacing Threshold Amplitude: 0.875 V
Lead Channel Pacing Threshold Pulse Width: 0.4 ms
Lead Channel Pacing Threshold Pulse Width: 0.4 ms
Lead Channel Sensing Intrinsic Amplitude: 2.3 mV
Lead Channel Sensing Intrinsic Amplitude: 5.4 mV
Lead Channel Setting Pacing Amplitude: 1.125
Lead Channel Setting Pacing Amplitude: 1.625
Lead Channel Setting Pacing Pulse Width: 0.4 ms
Lead Channel Setting Sensing Sensitivity: 2 mV
Pulse Gen Model: 2110
Pulse Gen Serial Number: 7156802

## 2017-07-26 ENCOUNTER — Telehealth: Payer: Self-pay | Admitting: Cardiology
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
## 2017-07-27 DIAGNOSIS — Z85828 Personal history of other malignant neoplasm of skin: Secondary | ICD-10-CM | POA: Diagnosis not present

## 2017-07-27 DIAGNOSIS — C44319 Basal cell carcinoma of skin of other parts of face: Secondary | ICD-10-CM | POA: Diagnosis not present

## 2017-07-27 NOTE — Telephone Encounter (Signed)
Mr. Doenges calling back. I assisted him with a manual transmission- unable to stay on the phone as his monitor is connected through the landline.
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
## 2017-07-27 NOTE — Telephone Encounter (Signed)
Transmission still not received. LMOM to call back for assistance.

## 2017-07-28 NOTE — Telephone Encounter (Signed)
Spoke with [name] [name] informed him that transmission was received and reviewed and there were no episodes over the weekend at all on the transmission and that his pacemaker is functioning normally. Encouraged pt to f/u with PCP regarding this incident

## 2017-09-17 LAB — LIPID PANEL
Cholesterol: 171 (ref 0–200)
HDL: 64 (ref 35–70)
LDL Cholesterol: 94
Triglycerides: 63 (ref 40–160)

## 2017-09-28 DIAGNOSIS — Z85828 Personal history of other malignant neoplasm of skin: Secondary | ICD-10-CM | POA: Diagnosis not present

## 2017-09-28 DIAGNOSIS — D0439 Carcinoma in situ of skin of other parts of face: Secondary | ICD-10-CM | POA: Diagnosis not present

## 2017-09-28 DIAGNOSIS — D692 Other nonthrombocytopenic purpura: Secondary | ICD-10-CM | POA: Diagnosis not present

## 2017-09-28 DIAGNOSIS — D485 Neoplasm of uncertain behavior of skin: Secondary | ICD-10-CM | POA: Diagnosis not present

## 2017-09-28 DIAGNOSIS — L570 Actinic keratosis: Secondary | ICD-10-CM | POA: Diagnosis not present

## 2017-09-30 ENCOUNTER — Ambulatory Visit (INDEPENDENT_AMBULATORY_CARE_PROVIDER_SITE_OTHER): Payer: Medicare HMO | Admitting: *Deleted

## 2017-09-30 DIAGNOSIS — I10 Essential (primary) hypertension: Secondary | ICD-10-CM

## 2017-09-30 DIAGNOSIS — I495 Sick sinus syndrome: Secondary | ICD-10-CM | POA: Diagnosis not present

## 2017-09-30 NOTE — Progress Notes (Signed)
Remote pacemaker transmission.   

## 2017-10-13 DIAGNOSIS — M19011 Primary osteoarthritis, right shoulder: Secondary | ICD-10-CM | POA: Diagnosis not present

## 2017-10-13 DIAGNOSIS — M25511 Pain in right shoulder: Secondary | ICD-10-CM | POA: Diagnosis not present

## 2017-10-14 DIAGNOSIS — Z85828 Personal history of other malignant neoplasm of skin: Secondary | ICD-10-CM | POA: Diagnosis not present

## 2017-10-14 DIAGNOSIS — D0439 Carcinoma in situ of skin of other parts of face: Secondary | ICD-10-CM | POA: Diagnosis not present

## 2017-10-22 LAB — CUP PACEART REMOTE DEVICE CHECK
Battery Remaining Longevity: 105 mo
Battery Remaining Percentage: 73 %
Battery Voltage: 2.92 V
Brady Statistic AP VP Percent: 1 %
Brady Statistic AP VS Percent: 1.3 %
Brady Statistic AS VP Percent: 1 %
Brady Statistic AS VS Percent: 99 %
Brady Statistic RA Percent Paced: 1.3 %
Brady Statistic RV Percent Paced: 1 %
Date Time Interrogation Session: 20190613115359
Implantable Lead Implant Date: 20111010
Implantable Lead Implant Date: 20111010
Implantable Lead Location: 753859
Implantable Lead Location: 753860
Implantable Pulse Generator Implant Date: 20111010
Lead Channel Impedance Value: 330 Ohm
Lead Channel Impedance Value: 330 Ohm
Lead Channel Pacing Threshold Amplitude: 0.75 V
Lead Channel Pacing Threshold Amplitude: 1.125 V
Lead Channel Pacing Threshold Pulse Width: 0.4 ms
Lead Channel Pacing Threshold Pulse Width: 0.4 ms
Lead Channel Sensing Intrinsic Amplitude: 2.6 mV
Lead Channel Sensing Intrinsic Amplitude: 6.6 mV
Lead Channel Setting Pacing Amplitude: 1.375
Lead Channel Setting Pacing Amplitude: 1.75 V
Lead Channel Setting Pacing Pulse Width: 0.4 ms
Lead Channel Setting Sensing Sensitivity: 2 mV
Pulse Gen Model: 2110
Pulse Gen Serial Number: 7156802

## 2017-10-26 DIAGNOSIS — Z823 Family history of stroke: Secondary | ICD-10-CM | POA: Diagnosis not present

## 2017-10-26 DIAGNOSIS — I10 Essential (primary) hypertension: Secondary | ICD-10-CM | POA: Diagnosis not present

## 2017-10-26 DIAGNOSIS — Z950 Presence of cardiac pacemaker: Secondary | ICD-10-CM | POA: Diagnosis not present

## 2017-10-26 DIAGNOSIS — Z85828 Personal history of other malignant neoplasm of skin: Secondary | ICD-10-CM | POA: Diagnosis not present

## 2017-10-26 DIAGNOSIS — E785 Hyperlipidemia, unspecified: Secondary | ICD-10-CM | POA: Diagnosis not present

## 2017-10-26 DIAGNOSIS — Z87891 Personal history of nicotine dependence: Secondary | ICD-10-CM | POA: Diagnosis not present

## 2017-11-15 ENCOUNTER — Encounter: Payer: Self-pay | Admitting: Family Medicine

## 2017-11-15 ENCOUNTER — Ambulatory Visit (INDEPENDENT_AMBULATORY_CARE_PROVIDER_SITE_OTHER): Payer: Medicare HMO | Admitting: Family Medicine

## 2017-11-15 VITALS — BP 118/58 | HR 53 | Temp 97.8°F | Wt 172.7 lb

## 2017-11-15 DIAGNOSIS — D692 Other nonthrombocytopenic purpura: Secondary | ICD-10-CM | POA: Diagnosis not present

## 2017-11-15 DIAGNOSIS — T148XXA Other injury of unspecified body region, initial encounter: Secondary | ICD-10-CM

## 2017-11-15 DIAGNOSIS — Z23 Encounter for immunization: Secondary | ICD-10-CM

## 2017-11-15 DIAGNOSIS — B351 Tinea unguium: Secondary | ICD-10-CM

## 2017-11-15 MED ORDER — CICLOPIROX 8 % EX SOLN: Freq: Every day | CUTANEOUS | 5 refills | Status: DC

## 2017-11-15 NOTE — Patient Instructions (Addendum)
Let us know if any worsening of wounds. Keep wounds moist for best healing.   Topical toenail treatment nightly. Expect at least 3 months of treatment for visible improvement.   Fungal Nail Infection Fungal nail infection is a common fungal infection of the toenails or fingernails. This condition affects toenails more often than fingernails. More than one nail may be infected. The condition can be passed from person to person (is contagious). What are the causes? This condition is caused by a fungus. Several types of funguses can cause the infection. These funguses are common in moist and warm areas. If your hands or feet come into contact with the fungus, it may get into a crack in your fingernail or toenail and cause the infection. What increases the risk? The following factors may make you more likely to develop this condition:  Being male.  Having diabetes.  Being of older age.  Living with someone who has the fungus.  Walking barefoot in areas where the fungus thrives, such as showers or locker rooms.  Having poor circulation.  Wearing shoes and socks that cause your feet to sweat.  Having athlete's foot.  Having a nail injury or history of a recent nail surgery.  Having psoriasis.  Having a weak body defense system (immune system).  What are the signs or symptoms? Symptoms of this condition include:  A pale spot on the nail.  Thickening of the nail.  A nail that becomes yellow or brown.  A brittle or ragged nail edge.  A crumbling nail.  A nail that has lifted away from the nail bed.  How is this diagnosed? This condition is diagnosed with a physical exam. Your health care provider may take a scraping or clipping from your nail to test for the fungus. How is this treated? Mild infections do not need treatment. If you have significant nail changes, treatment may include:  Oral antifungal medicines. You may need to take the medicine for several weeks or  several months, and you may not see the results for a long time. These medicines can cause side effects. Ask your health care provider what problems to watch for.  Antifungal nail polish and nail cream. These may be used along with oral antifungal medicines.  Laser treatment of the nail.  Surgery to remove the nail. This may be needed for the most severe infections.  Treatment takes a long time, and the infection may come back. Follow these instructions at home: Medicines  Take or apply over-the-counter and prescription medicines only as told by your health care provider.  Ask your health care provider about using over-the-counter mentholated ointment on your nails. Lifestyle   Do not share personal items, such as towels or nail clippers.  Trim your nails often.  Wash and dry your hands and feet every day.  Wear absorbent socks, and change your socks frequently.  Wear shoes that allow air to circulate, such as sandals or canvas tennis shoes. Throw out old shoes.  Wear rubber gloves if you are working with your hands in wet areas.  Do not walk barefoot in shower rooms or locker rooms.  Do not use a nail salon that does not use clean instruments.  Do not use artificial nails. General instructions  Keep all follow-up visits as told by your health care provider. This is important.  Use antifungal foot powder on your feet and in your shoes. Contact a health care provider if: Your infection is not getting better or it is  getting worse after several months. This information is not intended to replace advice given to you by your health care provider. Make sure you discuss any questions you have with your health care provider. Document Released: 04/03/2000 Document Revised: 09/12/2015 Document Reviewed: 10/08/2014 Elsevier Interactive Patient Education  2018 [employer].

## 2017-11-15 NOTE — Progress Notes (Signed)
  [name] [name] DOB: 1941/04/16 Encounter date: 11/15/2017  This is a 77 y.o. male who presents with Chief Complaint  Patient presents with  . Abrasion    scratch, left ankle, rusty cable, friday, cleaned with peroxide and used neosporin and bandaid,     History of present illness:  Scratched left leg with rusty cable. Had some bleeding, skin sort of scratched off. No more bleeding. No drainage, no redness or swelling. No current pain. Wonders if due for tetanus shot.   Has been working out, going to the gym. Working on losing some weight.   States that toenails have been yellowed/thickened for years. Wondering if there is treatment for this or what is causing problem.    No Known Allergies No outpatient medications have been marked as taking for the 11/15/17 encounter (Office Visit) with Caren Macadam, MD.    Review of Systems  Constitutional: Negative for chills, fever and unexpected weight change (has been working on healthier weight; exercise/pickle ball).  Respiratory: Negative for chest tightness and shortness of breath.   Cardiovascular: Negative for chest pain and leg swelling.  Skin: Positive for wound (improved over weekend).  Hematological: Bruises/bleeds easily (notes that skin is thinning; easier to bruise and easier to bleed).    Objective:  BP (!) 118/58 (BP Location: Left Arm, Patient Position: Sitting, Cuff Size: Normal)   Pulse (!) 53   Temp 97.8 F (36.6 C) (Oral)   Wt 172 lb 11.2 oz (78.3 kg)   SpO2 97%   BMI 27.05 kg/m   Weight: 172 lb 11.2 oz (78.3 kg)   BP Readings from Last 3 Encounters:  11/15/17 (!) 118/58  04/06/17 (!) 156/73  02/12/17 130/80   Wt Readings from Last 3 Encounters:  11/15/17 172 lb 11.2 oz (78.3 kg)  04/06/17 179 lb (81.2 kg)  02/12/17 179 lb 6.4 oz (81.4 kg)    Physical Exam  Constitutional: He appears well-developed and well-nourished. No distress.  Cardiovascular: Normal rate, regular rhythm and normal heart  sounds. Exam reveals no friction rub.  No murmur heard. Pulmonary/Chest: Effort normal and breath sounds normal. No respiratory distress. He has no wheezes. He has no rales.  Skin:  There is a healing abrasion front of left leg with two areas of approx 0.25cm skin tear. No surrounding erythema, no drainage from wounds.   Toenails - thickened, yellow.   Skin on hands and lower legs is thinner. There are noted benign appearing purpura bilat hands.  Psychiatric: He has a normal mood and affect.    Assessment/Plan  1. Abrasion Overdue for tetanus booster; will complete today. Wound healing is appropriate. Ok to keep wounds well moisturized with topical bacitracin/neosporin.  2. Onychomycosis Discussed chances of recurrence and difficulty with treating. Due to less side effects he would like to proceed with topical treatment using penlac.   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 No treatment needed.       Micheline Rough, MD

## 2017-11-16 ENCOUNTER — Encounter: Payer: Self-pay | Admitting: Family Medicine

## 2017-12-30 ENCOUNTER — Ambulatory Visit (INDEPENDENT_AMBULATORY_CARE_PROVIDER_SITE_OTHER): Payer: Medicare HMO | Admitting: *Deleted

## 2017-12-30 ENCOUNTER — Telehealth: Payer: Self-pay

## 2017-12-30 DIAGNOSIS — I495 Sick sinus syndrome: Secondary | ICD-10-CM | POA: Diagnosis not present

## 2017-12-30 NOTE — Telephone Encounter (Signed)
LMOVM reminding pt to send remote transmission.   

## 2017-12-31 NOTE — Progress Notes (Signed)
Remote pacemaker transmission.   

## 2018-01-07 DIAGNOSIS — R69 Illness, unspecified: Secondary | ICD-10-CM | POA: Diagnosis not present

## 2018-01-14 ENCOUNTER — Ambulatory Visit: Payer: Medicare Other

## 2018-01-18 NOTE — Progress Notes (Addendum)
Subjective:   [name] [name] is a 77 y.o. male who presents for Medicare Annual/Subsequent preventive examination.  Reports health as good  BMI 26  Episode of RMSF [facility] to [facility] PSV 23  Diet  In am english muffin with pb and banana Strawberry smoothie Lunch sandwich ; [place] sandwich   Supper; Hello fresh 3 meals per day  To much work; lots of prep  Stopped eating ice cream as much    BMI 27   Exercise routine in gym Elliptical Upper body or and weights  Generally spends > 60 minutes  Play a lot of pickle ball   ETOH 2 beers a day   [facility] him     There are no preventive care reminders to display for this patient.  AAA checked 06/2011 due to remote smoking hx   Colonoscopy 08/2014 and due 09/2019  Dr. Ronnald Ramp dermatology GI going to [facility] doctor at the [place] - new hearing aids last year  Dr. Jens Som; pacemaker   Cardiac Risk Factors include: advanced age (>47men, >19 women);dyslipidemia;hypertension     Objective:    Vitals: BP (!) 144/80   Pulse 76   Ht 5\' 7"  (1.702 m)   Wt 171 lb (77.6 kg)   SpO2 96%   BMI 26.78 kg/m   Body mass index is 26.78 kg/m.  Advanced Directives 01/19/2018 01/12/2017 09/05/2014  Does Patient Have a Medical Advance Directive? Yes Yes Yes  Type of Advance Directive - - [place];Living will    Tobacco Social History   Tobacco Use  Smoking Status Former Smoker  . Packs/day: 1.00  . Years: 20.00  . Pack years: 20.00  . Types: Cigarettes  . Last attempt to quit: 08/27/1982  . Years since quitting: 35.4  Smokeless Tobacco Never Used     Counseling given: Yes   Clinical Intake:   Past Medical History:  Diagnosis Date  . GERD (gastroesophageal reflux disease)   . HYPERLIPIDEMIA 07/17/2008  . HYPERTENSION 07/17/2008  . Sinus node dysfunction ([place]) 01/27/2011   a. s/p STJ dual chamber PPM   . Sleep apnea    doesnt wear cpap   Past Surgical History:  Procedure  Laterality Date  . COLONOSCOPY    . PACEMAKER PLACEMENT     STJ dual chamber PPM implant 2011  . POLYPECTOMY     Family History  Problem Relation Age of Onset  . Hypertension Mother   . Stroke Mother   . Colitis Father   . Hypertension Father   . Other Father        esophageal stricture  . Alcohol abuse Maternal Aunt   . Colon cancer Neg Hx   . Rectal cancer Neg Hx   . Stomach cancer Neg Hx   . Esophageal cancer Neg Hx    Social History   Socioeconomic History  . Marital status: Married    Spouse name: Not on file  . Number of children: 1  . Years of education: Not on file  . Highest education level: Not on file  Occupational History  . Occupation: CO-OWNER    Employer: [employer]  . Financial resource strain: Not on file  . Food insecurity:    Worry: Not on file    Inability: Not on file  . Transportation needs:    Medical: Not on file    Non-medical: Not on file  Tobacco Use  . Smoking status:  Former Smoker    Packs/day: 1.00    Years: 20.00    Pack years: 20.00    Types: Cigarettes    Last attempt to quit: 08/27/1982    Years since quitting: 35.4  . Smokeless tobacco: Never Used  Substance and Sexual Activity  . Alcohol use: Yes    Alcohol/week: 14.0 standard drinks    Types: 14 Standard drinks or equivalent per week    Comment: beer x 2 per day with meals/ may slow down   . Drug use: No  . Sexual activity: Not on file  Lifestyle  . Physical activity:    Days per week: Not on file    Minutes per session: Not on file  . Stress: Not on file  Relationships  . Social connections:    Talks on phone: Not on file    Gets together: Not on file    Attends religious service: Not on file    Active member of club or organization: Not on file    Attends meetings of clubs or organizations: Not on file    Relationship status: Not on file  Other Topics Concern  . Not on file  Social History Narrative  . Not on file    Outpatient Encounter  Medications as of 01/19/2018  Medication Sig  . benazepril (LOTENSIN) 40 MG tablet Take 40 mg by mouth daily.  . ciclopirox (PENLAC) 8 % solution Apply topically at bedtime. Apply over nail and surrounding skin.  . rosuvastatin (CRESTOR) 5 MG tablet 5 mg. Half tab daily   No facility-administered encounter medications on file as of 01/19/2018.     Activities of Daily Living In your present state of health, do you have any difficulty performing the following activities: 01/19/2018  Hearing? N  Vision? N  Difficulty concentrating or making decisions? N  Walking or climbing stairs? N  Dressing or bathing? N  Doing errands, shopping? N  Preparing Food and eating ? N  Using the Toilet? N  In the past six months, have you accidently leaked urine? Y  Comment does get up a couple times at hs   Do you have problems with loss of bowel control? N  Managing your Medications? N  Managing your Finances? N  Housekeeping or managing your Housekeeping? N  Some recent data might be hidden    Patient Care Team: [name], MD as PCP - General   Assessment:   This is a routine wellness examination for [name] [name].  Exercise Activities and Dietary recommendations Current Exercise Habits: Home exercise routine, Type of exercise: strength training/weights(pickle ball; elliptical ), Time (Minutes): 60, Frequency (Times/Week): 5, Weekly Exercise (Minutes/Week): 300, Intensity: Moderate  Goals    . patient     Maintain health and curtail ETOH to 2 per day or less     . Patient Stated     Will maintain good habits in exercise and nutrition       Fall Risk Fall Risk  01/19/2018 03/25/2017 01/12/2017 03/27/2016 01/04/2015  Falls in the past year? No Yes No No No  Comment - Emmi Telephone Survey: data to providers prior to load - Emmi Telephone Survey: data to providers prior to load -  Number falls in past yr: - 1 - - -  Comment - Emmi Telephone Survey Actual Response = 1 - - -  Injury with Fall? -  No - - -     Depression Screen PHQ 2/9 Scores 01/19/2018 01/12/2017 01/04/2015 09/01/2013  PHQ -  2 Score 0 0 0 0    Cognitive Function MMSE - Mini Mental State Exam 01/19/2018  Not completed: (No Data)   Ad8 score reviewed for issues:  Issues making decisions:  Less interest in hobbies / activities:  Repeats questions, stories (family complaining):  Trouble using ordinary gadgets (microwave, computer, phone):  Forgets the month or year:   Mismanaging finances:   Remembering appts:  Daily problems with thinking and/or memory: Ad8 score is=0      Immunization History  Administered Date(s) Administered  . Hepatitis A, Adult 02/12/2017  . Influenza Split 03/02/2011  . Influenza, High Dose Seasonal PF 12/22/2016  . Influenza,inj,Quad PF,6+ Mos 01/04/2015  . Influenza-Unspecified 12/19/2012, 01/17/2014, 01/07/2018  . Pneumococcal Conjugate-13 08/31/2013  . Pneumococcal Polysaccharide-23 04/21/2007  . Td 05/21/2006, 11/03/2007, 11/15/2017      Screening Tests Health Maintenance  Topic Date Due  . PNA vac Low Risk Adult (2 of 2 - PPSV23) 04/19/2018 (Originally 09/01/2014)  . COLONOSCOPY  09/19/2019  . TETANUS/TDAP  11/16/2027  . INFLUENZA VACCINE  Completed           Plan:      PCP Notes   Health Maintenance AAA checked 06/2011 Colonoscopy 08/2014 and due 09/2019 Had PSV 23 most likely at 57 at the [facility]   Dr. Ronnald Ramp dermatology GI going to [facility] doctor at the [place] - new hearing aids last year  Dr. Jens Som; pacemaker     Abnormal Screens  none  Referrals  none  Patient concerns; As noted   Nurse Concerns; As noted   Next PCP apt Will make an apt to see Dr. Elease Hashimoto  Will schedule AWV after he sees Dr. Elease Hashimoto this year    I have personally reviewed and noted the following in the patient's chart:   . Medical and social history . Use of alcohol, tobacco or illicit drugs  . Current medications and supplements . Functional  ability and status . Nutritional status . Physical activity . Advanced directives . List of other physicians . Hospitalizations, surgeries, and ER visits in previous 12 months . Vitals . Screenings to include cognitive, depression, and falls . Referrals and appointments  In addition, I have reviewed and discussed with patient certain preventive protocols, quality metrics, and best practice recommendations. A written personalized care plan for preventive services as well as general preventive health recommendations were provided to patient.     OEVOJ,JKKXF, RN  01/19/2018  I have reviewed the documentation for the AWV and [place] provided by the health coach and agree with their documentation. I was immediately available for any questions  [name] MD [facility]

## 2018-01-19 ENCOUNTER — Ambulatory Visit (INDEPENDENT_AMBULATORY_CARE_PROVIDER_SITE_OTHER): Payer: Medicare HMO

## 2018-01-19 VITALS — BP 144/80 | HR 76 | Ht 67.0 in | Wt 171.0 lb

## 2018-01-19 DIAGNOSIS — Z0000 Encounter for general adult medical examination without abnormal findings: Secondary | ICD-10-CM

## 2018-01-19 LAB — CUP PACEART REMOTE DEVICE CHECK
Battery Remaining Longevity: 105 mo
Battery Remaining Percentage: 73 %
Battery Voltage: 2.92 V
Brady Statistic AP VP Percent: 1 %
Brady Statistic AP VS Percent: 1.6 %
Brady Statistic AS VP Percent: 1 %
Brady Statistic AS VS Percent: 98 %
Brady Statistic RA Percent Paced: 1.5 %
Brady Statistic RV Percent Paced: 1 %
Date Time Interrogation Session: 20190912204317
Implantable Lead Implant Date: 20111010
Implantable Lead Implant Date: 20111010
Implantable Lead Location: 753859
Implantable Lead Location: 753860
Implantable Pulse Generator Implant Date: 20111010
Lead Channel Impedance Value: 310 Ohm
Lead Channel Impedance Value: 330 Ohm
Lead Channel Pacing Threshold Amplitude: 0.625 V
Lead Channel Pacing Threshold Amplitude: 0.875 V
Lead Channel Pacing Threshold Pulse Width: 0.4 ms
Lead Channel Pacing Threshold Pulse Width: 0.4 ms
Lead Channel Sensing Intrinsic Amplitude: 2.6 mV
Lead Channel Sensing Intrinsic Amplitude: 5.9 mV
Lead Channel Setting Pacing Amplitude: 1.125
Lead Channel Setting Pacing Amplitude: 1.625
Lead Channel Setting Pacing Pulse Width: 0.4 ms
Lead Channel Setting Sensing Sensitivity: 2 mV
Pulse Gen Model: 2110
Pulse Gen Serial Number: 7156802

## 2018-01-19 NOTE — Patient Instructions (Addendum)
[name] [name] , Thank you for taking time to come for your Medicare Wellness Visit. I appreciate your ongoing commitment to your health goals. Please review the following plan we discussed and let me know if I can assist you in the future.    Check with the [place] for Official date of your pneumovax (23)  It was most likely after March 11 2008  Check with the [place] about getting your shingrix  Shingrix is a vaccine for the prevention of Shingles in Adults 50 and older.  If you are on Medicare, the shingrix is covered under your Part D plan, so you will take both of the vaccines in the series at your pharmacy. Please check with your benefits regarding applicable copays or out of pocket expenses.  The Shingrix is given in 2 vaccines approx 8 weeks apart. You must receive the 2nd dose prior to 6 months from receipt of the first. Please have the pharmacist print out you Immunization  dates for our office records    These are the goals we discussed: Goals    . patient     Maintain health and curtail ETOH to 2 per day or less     . Patient Stated     Will maintain good habits in exercise and nutrition       This is a list of the screening recommended for you and due dates:  Health Maintenance  Topic Date Due  . Pneumonia vaccines (2 of 2 - PPSV23) 04/19/2018*  . Colon Cancer Screening  09/19/2019  . Tetanus Vaccine  11/16/2027  . Flu Shot  Completed  *Topic was postponed. The date shown is not the original due date.      Fall Prevention in the Home Falls can cause injuries. They can happen to people of all ages. There are many things you can do to make your home safe and to help prevent falls. What can I do on the outside of my home?  Regularly fix the edges of walkways and driveways and fix any cracks.  Remove anything that might make you trip as you walk through a door, such as a raised step or threshold.  Trim any bushes or trees on the path to your home.  Use bright outdoor  lighting.  Clear any walking paths of anything that might make someone trip, such as rocks or tools.  Regularly check to see if handrails are loose or broken. Make sure that both sides of any steps have handrails.  Any raised decks and porches should have guardrails on the edges.  Have any leaves, snow, or ice cleared regularly.  Use sand or salt on walking paths during winter.  Clean up any spills in your garage right away. This includes oil or grease spills. What can I do in the bathroom?  Use night lights.  Install grab bars by the toilet and in the tub and shower. Do not use towel bars as grab bars.  Use non-skid mats or decals in the tub or shower.  If you need to sit down in the shower, use a plastic, non-slip stool.  Keep the floor dry. Clean up any water that spills on the floor as soon as it happens.  Remove soap buildup in the tub or shower regularly.  Attach bath mats securely with double-sided non-slip rug tape.  Do not have throw rugs and other things on the floor that can make you trip. What can I do in the bedroom?  Use night  lights.  Make sure that you have a light by your bed that is easy to reach.  Do not use any sheets or blankets that are too big for your bed. They should not hang down onto the floor.  Have a firm chair that has side arms. You can use this for support while you get dressed.  Do not have throw rugs and other things on the floor that can make you trip. What can I do in the kitchen?  Clean up any spills right away.  Avoid walking on wet floors.  Keep items that you use a lot in easy-to-reach places.  If you need to reach something above you, use a strong step stool that has a grab bar.  Keep electrical cords out of the way.  Do not use floor polish or wax that makes floors slippery. If you must use wax, use non-skid floor wax.  Do not have throw rugs and other things on the floor that can make you trip. What can I do with my  stairs?  Do not leave any items on the stairs.  Make sure that there are handrails on both sides of the stairs and use them. Fix handrails that are broken or loose. Make sure that handrails are as long as the stairways.  Check any carpeting to make sure that it is firmly attached to the stairs. Fix any carpet that is loose or worn.  Avoid having throw rugs at the top or bottom of the stairs. If you do have throw rugs, attach them to the floor with carpet tape.  Make sure that you have a light switch at the top of the stairs and the bottom of the stairs. If you do not have them, ask someone to add them for you. What else can I do to help prevent falls?  Wear shoes that: ? Do not have high heels. ? Have rubber bottoms. ? Are comfortable and fit you well. ? Are closed at the toe. Do not wear sandals.  If you use a stepladder: ? Make sure that it is fully opened. Do not climb a closed stepladder. ? Make sure that both sides of the stepladder are locked into place. ? Ask someone to hold it for you, if possible.  Clearly mark and make sure that you can see: ? Any grab bars or handrails. ? First and last steps. ? Where the edge of each step is.  Use tools that help you move around (mobility aids) if they are needed. These include: ? Canes. ? Walkers. ? Scooters. ? Crutches.  Turn on the lights when you go into a dark area. Replace any light bulbs as soon as they burn out.  Set up your furniture so you have a clear path. Avoid moving your furniture around.  If any of your floors are uneven, fix them.  If there are any pets around you, be aware of where they are.  Review your medicines with your doctor. Some medicines can make you feel dizzy. This can increase your chance of falling. Ask your doctor what other things that you can do to help prevent falls. This information is not intended to replace advice given to you by your health care provider. Make sure you discuss any  questions you have with your health care provider. Document Released: 01/31/2009 Document Revised: 09/12/2015 Document Reviewed: 05/11/2014 Elsevier Interactive Patient Education  2018 [place] Maintenance, Male A healthy lifestyle and preventive care is important for  your health and wellness. Ask your health care provider about what schedule of regular examinations is right for you. What should I know about weight and diet? Eat a Healthy Diet  Eat plenty of vegetables, fruits, whole grains, low-fat dairy products, and lean protein.  Do not eat a lot of foods high in solid fats, added sugars, or salt.  Maintain a Healthy Weight Regular exercise can help you achieve or maintain a healthy weight. You should:  Do at least 150 minutes of exercise each week. The exercise should increase your heart rate and make you sweat (moderate-intensity exercise).  Do strength-training exercises at least twice a week.  Watch Your Levels of Cholesterol and Blood Lipids  Have your blood tested for lipids and cholesterol every 5 years starting at 75 years of age. If you are at high risk for heart disease, you should start having your blood tested when you are 75 years old. You may need to have your cholesterol levels checked more often if: ? Your lipid or cholesterol levels are high. ? You are older than 75 years of age. ? You are at high risk for heart disease.  What should I know about cancer screening? Many types of cancers can be detected early and may often be prevented. Lung Cancer  You should be screened every year for lung cancer if: ? You are a current smoker who has smoked for at least 30 years. ? You are a former smoker who has quit within the past 15 years.  Talk to your health care provider about your screening options, when you should start screening, and how often you should be screened.  Colorectal Cancer  Routine colorectal cancer screening usually begins at 75  years of age and should be repeated every 5-10 years until you are 75 years old. You may need to be screened more often if early forms of precancerous polyps or small growths are found. Your health care provider may recommend screening at an earlier age if you have risk factors for colon cancer.  Your health care provider may recommend using home test kits to check for hidden blood in the stool.  A small camera at the end of a tube can be used to examine your colon (sigmoidoscopy or colonoscopy). This checks for the earliest forms of colorectal cancer.  Prostate and Testicular Cancer  Depending on your age and overall health, your health care provider may do certain tests to screen for prostate and testicular cancer.  Talk to your health care provider about any symptoms or concerns you have about testicular or prostate cancer.  Skin Cancer  Check your skin from head to toe regularly.  Tell your health care provider about any new moles or changes in moles, especially if: ? There is a change in a mole's size, shape, or color. ? You have a mole that is larger than a pencil eraser.  Always use sunscreen. Apply sunscreen liberally and repeat throughout the day.  Protect yourself by wearing long sleeves, pants, a wide-brimmed hat, and sunglasses when outside.  What should I know about heart disease, diabetes, and high blood pressure?  If you are 23-44 years of age, have your blood pressure checked every 3-5 years. If you are 29 years of age or older, have your blood pressure checked every year. You should have your blood pressure measured twice-once when you are at a hospital or clinic, and once when you are not at a hospital or clinic. Record the average of  the two measurements. To check your blood pressure when you are not at a hospital or clinic, you can use: ? An automated blood pressure machine at a pharmacy. ? A home blood pressure monitor.  Talk to your health care provider about your  target blood pressure.  If you are between 52-23 years old, ask your health care provider if you should take aspirin to prevent heart disease.  Have regular diabetes screenings by checking your fasting blood sugar level. ? If you are at a normal weight and have a low risk for diabetes, have this test once every three years after the age of 60. ? If you are overweight and have a high risk for diabetes, consider being tested at a younger age or more often.  A one-time screening for abdominal aortic aneurysm (AAA) by ultrasound is recommended for men aged 69-75 years who are current or former smokers. What should I know about preventing infection? Hepatitis B If you have a higher risk for hepatitis B, you should be screened for this virus. Talk with your health care provider to find out if you are at risk for hepatitis B infection. Hepatitis C Blood testing is recommended for:  Everyone born from [birth year] through 1963.  Anyone with known risk factors for hepatitis C.  Sexually Transmitted Diseases (STDs)  You should be screened each year for STDs including gonorrhea and chlamydia if: ? You are sexually active and are younger than 75 years of age. ? You are older than 75 years of age and your health care provider tells you that you are at risk for this type of infection. ? Your sexual activity has changed since you were last screened and you are at an increased risk for chlamydia or gonorrhea. Ask your health care provider if you are at risk.  Talk with your health care provider about whether you are at high risk of being infected with HIV. Your health care provider may recommend a prescription medicine to help prevent HIV infection.  What else can I do?  Schedule regular health, dental, and eye exams.  Stay current with your vaccines (immunizations).  Do not use any tobacco products, such as cigarettes, chewing tobacco, and e-cigarettes. If you need help quitting, ask your health care  provider.  Limit alcohol intake to no more than 2 drinks per day. One drink equals 12 ounces of beer, 5 ounces of wine, or 1 ounces of hard liquor.  Do not use street drugs.  Do not share needles.  Ask your health care provider for help if you need support or information about quitting drugs.  Tell your health care provider if you often feel depressed.  Tell your health care provider if you have ever been abused or do not feel safe at home. This information is not intended to replace advice given to you by your health care provider. Make sure you discuss any questions you have with your health care provider. Document Released: 10/03/2007 Document Revised: 12/04/2015 Document Reviewed: 01/08/2015 Elsevier Interactive Patient Education  [employer].

## 2018-01-25 ENCOUNTER — Ambulatory Visit (INDEPENDENT_AMBULATORY_CARE_PROVIDER_SITE_OTHER): Payer: Medicare HMO | Admitting: Family Medicine

## 2018-01-25 ENCOUNTER — Encounter: Payer: Self-pay | Admitting: Family Medicine

## 2018-01-25 ENCOUNTER — Other Ambulatory Visit: Payer: Self-pay

## 2018-01-25 VITALS — BP 136/80 | HR 54 | Temp 97.5°F | Ht 67.0 in | Wt 169.9 lb

## 2018-01-25 DIAGNOSIS — E785 Hyperlipidemia, unspecified: Secondary | ICD-10-CM | POA: Diagnosis not present

## 2018-01-25 DIAGNOSIS — I495 Sick sinus syndrome: Secondary | ICD-10-CM | POA: Diagnosis not present

## 2018-01-25 DIAGNOSIS — I10 Essential (primary) hypertension: Secondary | ICD-10-CM | POA: Diagnosis not present

## 2018-01-25 MED ORDER — BENAZEPRIL HCL 40 MG PO TABS: 40.0000 mg | ORAL_TABLET | Freq: Every day | ORAL | 3 refills | Status: DC

## 2018-01-25 MED ORDER — ROSUVASTATIN CALCIUM 5 MG PO TABS: 2.5000 mg | ORAL_TABLET | Freq: Every day | ORAL | 3 refills | Status: DC

## 2018-01-25 NOTE — Progress Notes (Signed)
  Subjective:     Patient ID: [name] [name], male   DOB: 07-13-1940, 77 y.o.   MRN: [id number]  HPI Patient seen for medical follow-up.  He has hypertension treated with Lotensin 40 mg daily.  Blood pressures been stable.  No dizziness.  No headaches.  No recent chest pains.  History of syncope and sinus node dysfunction.  He has pacemaker and sees cardiologist once yearly.  Hyperlipidemia treated with Crestor.  He is on low-dose of 2.5 mg daily.  He had recent lipids done at the [place] back in the spring and these were reviewed.  Generally doing well.  Exercising several days per week.  No recent chest pains.  He had recent Medicare wellness visit.  Immunizations were addressed then.  Colonoscopy up-to-date.  Received recent tetanus.  Flu vaccine back in September  Past Medical History:  Diagnosis Date  . GERD (gastroesophageal reflux disease)   . HYPERLIPIDEMIA 07/17/2008  . HYPERTENSION 07/17/2008  . Sinus node dysfunction ([place]) 01/27/2011   a. s/p STJ dual chamber PPM   . Sleep apnea    doesnt wear cpap   Past Surgical History:  Procedure Laterality Date  . COLONOSCOPY    . PACEMAKER PLACEMENT     STJ dual chamber PPM implant 2011  . POLYPECTOMY      reports that he quit smoking about 35 years ago. His smoking use included cigarettes. He has a 20.00 pack-year smoking history. He has never used smokeless tobacco. He reports that he drinks about 14.0 standard drinks of alcohol per week. He reports that he does not use drugs. family history includes Alcohol abuse in his maternal aunt; Colitis in his father; Hypertension in his father and mother; Other in his father; Stroke in his mother. No Known Allergies   Review of Systems  Constitutional: Negative for fatigue and unexpected weight change.  Eyes: Negative for visual disturbance.  Respiratory: Negative for cough, chest tightness and shortness of breath.   Cardiovascular: Negative for chest pain, palpitations and leg swelling.   Endocrine: Negative for polydipsia and polyuria.  Neurological: Negative for dizziness, syncope, weakness, light-headedness and headaches.       Objective:   Physical Exam  Constitutional: He is oriented to person, place, and time. He appears well-developed and well-nourished.  HENT:  Right Ear: External ear normal.  Left Ear: External ear normal.  Mouth/Throat: Oropharynx is clear and moist.  Eyes: Pupils are equal, round, and reactive to light.  Neck: Neck supple. No thyromegaly present.  Cardiovascular: Normal rate and regular rhythm.  Pulmonary/Chest: Effort normal and breath sounds normal. No respiratory distress. He has no wheezes. He has no rales.  Musculoskeletal: He exhibits no edema.  Neurological: He is alert and oriented to person, place, and time.       Assessment:     #1 hypertension stable  #2 hyperlipidemia-on Crestor  #3 history of sinus node dysfunction with past history of syncope -stable with pacemaker    Plan:     -Refilled benazepril and Crestor for 1 year -He will continue to get lab work through the [place] and have results sent here -Continue regular exercise habits  [name] MD [facility] at [facility]

## 2018-02-07 DIAGNOSIS — R69 Illness, unspecified: Secondary | ICD-10-CM | POA: Diagnosis not present

## 2018-02-28 DIAGNOSIS — D0471 Carcinoma in situ of skin of right lower limb, including hip: Secondary | ICD-10-CM | POA: Diagnosis not present

## 2018-02-28 DIAGNOSIS — L821 Other seborrheic keratosis: Secondary | ICD-10-CM | POA: Diagnosis not present

## 2018-02-28 DIAGNOSIS — Z85828 Personal history of other malignant neoplasm of skin: Secondary | ICD-10-CM | POA: Diagnosis not present

## 2018-02-28 DIAGNOSIS — D485 Neoplasm of uncertain behavior of skin: Secondary | ICD-10-CM | POA: Diagnosis not present

## 2018-02-28 DIAGNOSIS — L570 Actinic keratosis: Secondary | ICD-10-CM | POA: Diagnosis not present

## 2018-03-31 ENCOUNTER — Telehealth: Payer: Self-pay | Admitting: Cardiology

## 2018-03-31 NOTE — Telephone Encounter (Signed)
LMOVM reminding pt to send remote transmission.   

## 2018-04-01 ENCOUNTER — Encounter: Payer: Self-pay | Admitting: Cardiology

## 2018-04-26 ENCOUNTER — Telehealth: Payer: Self-pay | Admitting: Internal Medicine

## 2018-04-26 NOTE — Telephone Encounter (Signed)
See phone note attached to pt my chart message.

## 2018-04-26 NOTE — Telephone Encounter (Signed)
  Patient would like a call back to make sure his transmission went through

## 2018-04-26 NOTE — Telephone Encounter (Signed)
Spoke w/ pt and he informed me that he is currently in [facility] until April 2020 and he has a hard time finding a land line phone to hook the monitor up to. I instructed pt that I would be sending him a cell adapter to replace the land line phone. Pt verbalized understanding.

## 2018-05-09 ENCOUNTER — Ambulatory Visit (INDEPENDENT_AMBULATORY_CARE_PROVIDER_SITE_OTHER): Payer: Medicare HMO

## 2018-05-09 DIAGNOSIS — I495 Sick sinus syndrome: Secondary | ICD-10-CM | POA: Diagnosis not present

## 2018-05-10 NOTE — Progress Notes (Signed)
Remote pacemaker transmission.   

## 2018-05-11 LAB — CUP PACEART REMOTE DEVICE CHECK
Battery Remaining Longevity: 94 mo
Battery Remaining Percentage: 65 %
Battery Voltage: 2.9 V
Brady Statistic AP VP Percent: 1 %
Brady Statistic AP VS Percent: 1.7 %
Brady Statistic AS VP Percent: 1 %
Brady Statistic AS VS Percent: 98 %
Brady Statistic RA Percent Paced: 1.6 %
Brady Statistic RV Percent Paced: 1 %
Date Time Interrogation Session: 20200117215418
Implantable Lead Implant Date: 20111010
Implantable Lead Implant Date: 20111010
Implantable Lead Location: 753859
Implantable Lead Location: 753860
Implantable Pulse Generator Implant Date: 20111010
Lead Channel Impedance Value: 310 Ohm
Lead Channel Impedance Value: 310 Ohm
Lead Channel Pacing Threshold Amplitude: 0.625 V
Lead Channel Pacing Threshold Amplitude: 1 V
Lead Channel Pacing Threshold Pulse Width: 0.4 ms
Lead Channel Pacing Threshold Pulse Width: 0.4 ms
Lead Channel Sensing Intrinsic Amplitude: 2.7 mV
Lead Channel Sensing Intrinsic Amplitude: 8.2 mV
Lead Channel Setting Pacing Amplitude: 1.25 V
Lead Channel Setting Pacing Amplitude: 1.625
Lead Channel Setting Pacing Pulse Width: 0.4 ms
Lead Channel Setting Sensing Sensitivity: 2 mV
Pulse Gen Model: 2110
Pulse Gen Serial Number: 7156802

## 2018-08-02 ENCOUNTER — Other Ambulatory Visit: Payer: Self-pay

## 2018-08-02 ENCOUNTER — Telehealth (INDEPENDENT_AMBULATORY_CARE_PROVIDER_SITE_OTHER): Payer: Medicare HMO | Admitting: Internal Medicine

## 2018-08-02 ENCOUNTER — Telehealth: Payer: Medicare HMO | Admitting: Internal Medicine

## 2018-08-02 VITALS — BP 127/75 | HR 61 | Ht 67.0 in | Wt 170.0 lb

## 2018-08-02 DIAGNOSIS — I10 Essential (primary) hypertension: Secondary | ICD-10-CM

## 2018-08-02 DIAGNOSIS — E785 Hyperlipidemia, unspecified: Secondary | ICD-10-CM

## 2018-08-02 DIAGNOSIS — I495 Sick sinus syndrome: Secondary | ICD-10-CM

## 2018-08-02 DIAGNOSIS — Z950 Presence of cardiac pacemaker: Secondary | ICD-10-CM

## 2018-08-02 NOTE — Progress Notes (Signed)
Electrophysiology TeleHealth Note   Due to national recommendations of social distancing due to COVID 19, an audio/video telehealth visit is felt to be most appropriate for this patient at this time.  See MyChart message from today for the patient's consent to telehealth for [facility].   Date:  08/02/2018   ID:  [name] [name], DOB Sep 12, 1940, MRN [id number]  Location: patient's home  Provider location: [address], [place]  Evaluation Performed: Follow-up visit  PCP:  [name], MD    Electrophysiologist:  SK   Chief Complaint:  syncope  History of Present Illness:    [name] [name] is a 78 y.o. male who presents via audio/video conferencing for a telehealth visit today.  Since last being seen in our clinic, the patient denies chest pain, shortness of breath, nocturnal dyspnea, orthopnea or peripheral edema.  There have been no palpitations, lightheadedness or syncope.    Playing pickle ball   Hx of syncope 2/2 sinus node dysfunction demonstrated by loop recorder.  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  Staying isolated   Past Medical History:  Diagnosis Date  . GERD (gastroesophageal reflux disease)   . HYPERLIPIDEMIA 07/17/2008  . HYPERTENSION 07/17/2008  . Sinus node dysfunction ([place]) 01/27/2011   a. s/p STJ dual chamber PPM   . Sleep apnea    doesnt wear cpap    Past Surgical History:  Procedure Laterality Date  . COLONOSCOPY    . PACEMAKER PLACEMENT     STJ dual chamber PPM implant 2011  . POLYPECTOMY      Current Outpatient Medications  Medication Sig Dispense Refill  . acetaminophen (TYLENOL) 500 MG tablet Take 500 mg by mouth daily.    . benazepril (LOTENSIN) 40 MG tablet Take 1 tablet (40 mg total) by mouth daily. 90 tablet 3  . rosuvastatin (CRESTOR) 5 MG tablet Take 0.5 tablets (2.5 mg total) by mouth at bedtime. Half tab daily 45  tablet 3  . ciclopirox (PENLAC) 8 % solution Apply topically at bedtime. Apply over nail and surrounding skin. (Patient not taking: Reported on 08/02/2018) 6.6 mL 5   No current facility-administered medications for this visit.     Allergies:   Patient has no known allergies.   Social History:  The patient  reports that he quit smoking about 35 years ago. His smoking use included cigarettes. He has a 20.00 pack-year smoking history. He has never used smokeless tobacco. He reports current alcohol use of about 14.0 standard drinks of alcohol per week. He reports that he does not use drugs.   Family History:  The patient's   family history includes Alcohol abuse in his maternal aunt; Colitis in his father; Hypertension in his father and mother; Other in his father; Stroke in his mother.   ROS:  Please see the history of present illness.   All other systems are personally reviewed and negative.    Exam:    Vital Signs:  BP 127/75   Pulse 61   Ht 5\' 7"  (1.702 m)   Wt 170 lb (77.1 kg)   BMI 26.63 kg/m     Well appearing, alert and conversant, regular work of breathing,  good skin color Eyes- anicteric, neuro- grossly intact, skin- no apparent rash or lesions or cyanosis, mouth- oral mucosa is pink   Labs/Other Tests and Data Reviewed:    Recent Labs: No  results found for requested labs within last 8760 hours.   Wt Readings from Last 3 Encounters:  08/02/18 170 lb (77.1 kg)  01/25/18 169 lb 14.4 oz (77.1 kg)  01/19/18 171 lb (77.6 kg)     Other studies personally reviewed:  *  Last device remote is reviewed from [facility] PDF dated 1/20 which reveals normal device function, est longevity 7-8 yrs  ASSESSMENT & PLAN:    Syncope  Sinus node dysfunction  Pacemaker St Jude    Device function normal  No syncope  BP well controlled     COVID 19 screen The patient denies symptoms of COVID 19 at this time.  The importance of social distancing was discussed today.   Follow-up:  34 m Next remote: As Scheduled   Current medicines are reviewed at length with the patient today.   The patient does not have concerns regarding his medicines.  The following changes were made today:  none  Labs/ tests ordered today include:   No orders of the defined types were placed in this encounter.   Future tests ( post COVID )    Patient Risk:  after full review of this patients clinical status, I feel that they are at moderate risk at this time.  Today, I have spent 18 minutes with the patient with telehealth technology discussing the above.  Signed, Virl Axe, MD  08/02/2018 2:55 PM     [place] [address] [place] [address] [phone] (office) [phone] (fax)

## 2018-08-03 DIAGNOSIS — D3001 Benign neoplasm of right kidney: Secondary | ICD-10-CM | POA: Diagnosis not present

## 2018-08-08 ENCOUNTER — Other Ambulatory Visit: Payer: Self-pay

## 2018-08-08 ENCOUNTER — Ambulatory Visit (INDEPENDENT_AMBULATORY_CARE_PROVIDER_SITE_OTHER): Payer: Medicare HMO | Admitting: *Deleted

## 2018-08-08 DIAGNOSIS — I495 Sick sinus syndrome: Secondary | ICD-10-CM

## 2018-08-09 ENCOUNTER — Telehealth: Payer: Self-pay

## 2018-08-09 LAB — CUP PACEART REMOTE DEVICE CHECK
Battery Remaining Longevity: 94 mo
Battery Remaining Percentage: 65 %
Battery Voltage: 2.9 V
Brady Statistic AP VP Percent: 1 %
Brady Statistic AP VS Percent: 2 %
Brady Statistic AS VP Percent: 1 %
Brady Statistic AS VS Percent: 98 %
Brady Statistic RA Percent Paced: 1.9 %
Brady Statistic RV Percent Paced: 1 %
Date Time Interrogation Session: 20200421132634
Implantable Lead Implant Date: 20111010
Implantable Lead Implant Date: 20111010
Implantable Lead Location: 753859
Implantable Lead Location: 753860
Implantable Pulse Generator Implant Date: 20111010
Lead Channel Impedance Value: 330 Ohm
Lead Channel Impedance Value: 330 Ohm
Lead Channel Pacing Threshold Amplitude: 0.75 V
Lead Channel Pacing Threshold Amplitude: 0.875 V
Lead Channel Pacing Threshold Pulse Width: 0.4 ms
Lead Channel Pacing Threshold Pulse Width: 0.4 ms
Lead Channel Sensing Intrinsic Amplitude: 3.2 mV
Lead Channel Sensing Intrinsic Amplitude: 6.4 mV
Lead Channel Setting Pacing Amplitude: 1.125
Lead Channel Setting Pacing Amplitude: 1.75 V
Lead Channel Setting Pacing Pulse Width: 0.4 ms
Lead Channel Setting Sensing Sensitivity: 2 mV
Pulse Gen Model: 2110
Pulse Gen Serial Number: 7156802

## 2018-08-09 NOTE — Telephone Encounter (Signed)
Spoke with patient to remind of missed remote transmission 

## 2018-08-16 ENCOUNTER — Encounter: Payer: Self-pay | Admitting: Cardiology

## 2018-08-16 NOTE — Progress Notes (Signed)
Remote pacemaker transmission.   

## 2018-08-30 DIAGNOSIS — Z85828 Personal history of other malignant neoplasm of skin: Secondary | ICD-10-CM | POA: Diagnosis not present

## 2018-08-30 DIAGNOSIS — L570 Actinic keratosis: Secondary | ICD-10-CM | POA: Diagnosis not present

## 2018-08-30 DIAGNOSIS — L821 Other seborrheic keratosis: Secondary | ICD-10-CM | POA: Diagnosis not present

## 2018-08-30 DIAGNOSIS — L603 Nail dystrophy: Secondary | ICD-10-CM | POA: Diagnosis not present

## 2018-08-30 DIAGNOSIS — D2372 Other benign neoplasm of skin of left lower limb, including hip: Secondary | ICD-10-CM | POA: Diagnosis not present

## 2018-08-30 DIAGNOSIS — D225 Melanocytic nevi of trunk: Secondary | ICD-10-CM | POA: Diagnosis not present

## 2018-10-27 DIAGNOSIS — Z823 Family history of stroke: Secondary | ICD-10-CM | POA: Diagnosis not present

## 2018-10-27 DIAGNOSIS — Z7722 Contact with and (suspected) exposure to environmental tobacco smoke (acute) (chronic): Secondary | ICD-10-CM | POA: Diagnosis not present

## 2018-10-27 DIAGNOSIS — G4730 Sleep apnea, unspecified: Secondary | ICD-10-CM | POA: Diagnosis not present

## 2018-10-27 DIAGNOSIS — I10 Essential (primary) hypertension: Secondary | ICD-10-CM | POA: Diagnosis not present

## 2018-10-27 DIAGNOSIS — Z8249 Family history of ischemic heart disease and other diseases of the circulatory system: Secondary | ICD-10-CM | POA: Diagnosis not present

## 2018-10-27 DIAGNOSIS — Z85828 Personal history of other malignant neoplasm of skin: Secondary | ICD-10-CM | POA: Diagnosis not present

## 2018-10-27 DIAGNOSIS — Z820 Family history of epilepsy and other diseases of the nervous system: Secondary | ICD-10-CM | POA: Diagnosis not present

## 2018-10-27 DIAGNOSIS — E785 Hyperlipidemia, unspecified: Secondary | ICD-10-CM | POA: Diagnosis not present

## 2018-10-27 DIAGNOSIS — R69 Illness, unspecified: Secondary | ICD-10-CM | POA: Diagnosis not present

## 2018-10-27 DIAGNOSIS — N529 Male erectile dysfunction, unspecified: Secondary | ICD-10-CM | POA: Diagnosis not present

## 2018-10-27 DIAGNOSIS — G4700 Insomnia, unspecified: Secondary | ICD-10-CM | POA: Diagnosis not present

## 2018-11-07 ENCOUNTER — Ambulatory Visit (INDEPENDENT_AMBULATORY_CARE_PROVIDER_SITE_OTHER): Payer: Medicare HMO | Admitting: *Deleted

## 2018-11-07 DIAGNOSIS — I495 Sick sinus syndrome: Secondary | ICD-10-CM | POA: Diagnosis not present

## 2018-11-08 LAB — CUP PACEART REMOTE DEVICE CHECK
Date Time Interrogation Session: 20200721092014
Implantable Lead Implant Date: 20111010
Implantable Lead Implant Date: 20111010
Implantable Lead Location: 753859
Implantable Lead Location: 753860
Implantable Pulse Generator Implant Date: 20111010
Pulse Gen Model: 2110
Pulse Gen Serial Number: 7156802

## 2018-11-21 ENCOUNTER — Encounter: Payer: Self-pay | Admitting: Cardiology

## 2018-11-21 NOTE — Progress Notes (Signed)
Remote pacemaker transmission.   

## 2018-12-05 DIAGNOSIS — R69 Illness, unspecified: Secondary | ICD-10-CM | POA: Diagnosis not present

## 2019-02-06 ENCOUNTER — Ambulatory Visit (INDEPENDENT_AMBULATORY_CARE_PROVIDER_SITE_OTHER): Payer: Medicare HMO | Admitting: *Deleted

## 2019-02-06 DIAGNOSIS — R55 Syncope and collapse: Secondary | ICD-10-CM

## 2019-02-06 DIAGNOSIS — I495 Sick sinus syndrome: Secondary | ICD-10-CM

## 2019-02-06 LAB — CUP PACEART REMOTE DEVICE CHECK
Date Time Interrogation Session: 20201019220646
Implantable Lead Implant Date: 20111010
Implantable Lead Implant Date: 20111010
Implantable Lead Location: 753859
Implantable Lead Location: 753860
Implantable Pulse Generator Implant Date: 20111010
Pulse Gen Model: 2110
Pulse Gen Serial Number: 7156802

## 2019-02-27 NOTE — Progress Notes (Signed)
Remote pacemaker transmission.   

## 2019-03-02 DIAGNOSIS — Z85828 Personal history of other malignant neoplasm of skin: Secondary | ICD-10-CM | POA: Diagnosis not present

## 2019-03-02 DIAGNOSIS — D1801 Hemangioma of skin and subcutaneous tissue: Secondary | ICD-10-CM | POA: Diagnosis not present

## 2019-03-02 DIAGNOSIS — L821 Other seborrheic keratosis: Secondary | ICD-10-CM | POA: Diagnosis not present

## 2019-03-02 DIAGNOSIS — L570 Actinic keratosis: Secondary | ICD-10-CM | POA: Diagnosis not present

## 2019-03-03 ENCOUNTER — Ambulatory Visit (INDEPENDENT_AMBULATORY_CARE_PROVIDER_SITE_OTHER): Payer: Medicare HMO | Admitting: Family Medicine

## 2019-03-03 ENCOUNTER — Encounter: Payer: Self-pay | Admitting: Family Medicine

## 2019-03-03 ENCOUNTER — Other Ambulatory Visit: Payer: Self-pay

## 2019-03-03 VITALS — BP 116/70 | HR 59 | Temp 98.4°F | Resp 16 | Ht 67.0 in | Wt 164.2 lb

## 2019-03-03 DIAGNOSIS — Z0000 Encounter for general adult medical examination without abnormal findings: Secondary | ICD-10-CM | POA: Diagnosis not present

## 2019-03-03 NOTE — Progress Notes (Signed)
Subjective:     Patient ID: [name] [name], male   DOB: 1940-07-14, 78 y.o.   MRN: [id number]  HPI [name] [name] is seen today for physical exam.  He has history of sinus node dysfunction with remote history of syncope.  He has pacemaker in place and gets that followed by cardiology regularly.  No recent dizziness or syncope.  No chest pains.  He has hypertension treated with benazepril.  He has hyperlipidemia treated with low-dose Crestor.  He brought in copy yesterday of labs from [place] that were done in October.  This included chemistries and lipid panel.  Lipids were well controlled.  A1c 5.7%.  Renal function normal.  CBC normal.  He plays pickle ball regularly.  No recent falls.  No depression issues.  Sold a marina couple years ago  Past Medical History:  Diagnosis Date  . GERD (gastroesophageal reflux disease)   . HYPERLIPIDEMIA 07/17/2008  . HYPERTENSION 07/17/2008  . Sinus node dysfunction () 01/27/2011   a. s/p STJ dual chamber PPM   . Sleep apnea    doesnt wear cpap   Past Surgical History:  Procedure Laterality Date  . COLONOSCOPY    . PACEMAKER PLACEMENT     STJ dual chamber PPM implant 2011  . POLYPECTOMY      reports that he quit smoking about 36 years ago. His smoking use included cigarettes. He has a 20.00 pack-year smoking history. He has never used smokeless tobacco. He reports current alcohol use of about 14.0 standard drinks of alcohol per week. He reports that he does not use drugs. family history includes Alcohol abuse in his maternal aunt; Colitis in his father; Hypertension in his father and mother; Other in his father; Stroke in his mother. No Known Allergies   Review of Systems  Constitutional: Negative for activity change, appetite change, fatigue and fever.  HENT: Negative for congestion, ear pain and trouble swallowing.   Eyes: Negative for pain and visual disturbance.  Respiratory: Negative for cough, shortness of breath and wheezing.   Cardiovascular:  Negative for chest pain and palpitations.  Gastrointestinal: Negative for abdominal distention, abdominal pain, blood in stool, constipation, diarrhea, nausea, rectal pain and vomiting.  Genitourinary: Negative for dysuria, hematuria and testicular pain.  Musculoskeletal: Negative for arthralgias and joint swelling.  Skin: Negative for rash.  Neurological: Negative for dizziness, syncope and headaches.  Hematological: Negative for adenopathy.  Psychiatric/Behavioral: Negative for confusion and dysphoric mood.       Objective:   Physical Exam Constitutional:      General: He is not in acute distress.    Appearance: He is well-developed.  HENT:     Head: Normocephalic and atraumatic.     Right Ear: External ear normal.     Left Ear: External ear normal.  Eyes:     Conjunctiva/sclera: Conjunctivae normal.     Pupils: Pupils are equal, round, and reactive to light.  Neck:     Musculoskeletal: Normal range of motion and neck supple.     Thyroid: No thyromegaly.  Cardiovascular:     Rate and Rhythm: Normal rate and regular rhythm.     Heart sounds: Normal heart sounds. No murmur.  Pulmonary:     Effort: No respiratory distress.     Breath sounds: No wheezing or rales.  Abdominal:     General: Bowel sounds are normal. There is no distension.     Palpations: Abdomen is soft. There is no mass.     Tenderness: There is  no abdominal tenderness. There is no guarding or rebound.  Lymphadenopathy:     Cervical: No cervical adenopathy.  Skin:    Findings: No rash.  Neurological:     Mental Status: He is alert and oriented to person, place, and time.     Cranial Nerves: No cranial nerve deficit.     Deep Tendon Reflexes: Reflexes normal.        Assessment:     Physical exam.  Chronic stable problems as above.  We discussed the following health maintenance issues    Plan:     -Flu vaccine already given -Patient recalls having previous Zostavax.  We discussed Shingrix vaccine and  he will check on insurance coverage and through the [place] -Continue current medications -Other immunizations up-to-date -Colonoscopy due next year  [name] MD [facility]

## 2019-03-03 NOTE — Patient Instructions (Signed)
Discuss with [facility] possible Shingrix vaccine at next follow up.

## 2019-04-03 ENCOUNTER — Other Ambulatory Visit: Payer: Self-pay | Admitting: Family Medicine

## 2019-04-21 DIAGNOSIS — G40909 Epilepsy, unspecified, not intractable, without status epilepticus: Secondary | ICD-10-CM

## 2019-04-21 HISTORY — DX: Epilepsy, unspecified, not intractable, without status epilepticus: G40.909

## 2019-05-01 DIAGNOSIS — R69 Illness, unspecified: Secondary | ICD-10-CM | POA: Diagnosis not present

## 2019-05-08 ENCOUNTER — Ambulatory Visit (INDEPENDENT_AMBULATORY_CARE_PROVIDER_SITE_OTHER): Payer: Medicare HMO | Admitting: *Deleted

## 2019-05-08 DIAGNOSIS — I495 Sick sinus syndrome: Secondary | ICD-10-CM | POA: Diagnosis not present

## 2019-05-09 DIAGNOSIS — M25561 Pain in right knee: Secondary | ICD-10-CM | POA: Diagnosis not present

## 2019-05-15 ENCOUNTER — Telehealth: Payer: Self-pay

## 2019-05-15 LAB — CUP PACEART REMOTE DEVICE CHECK
Battery Remaining Longevity: 73 mo
Battery Remaining Percentage: 51 %
Battery Voltage: 2.87 V
Brady Statistic AP VP Percent: 1 %
Brady Statistic AP VS Percent: 2.2 %
Brady Statistic AS VP Percent: 1 %
Brady Statistic AS VS Percent: 98 %
Brady Statistic RA Percent Paced: 2.1 %
Brady Statistic RV Percent Paced: 1 %
Date Time Interrogation Session: 20210119183734
Implantable Lead Implant Date: 20111010
Implantable Lead Implant Date: 20111010
Implantable Lead Location: 753859
Implantable Lead Location: 753860
Implantable Pulse Generator Implant Date: 20111010
Lead Channel Impedance Value: 290 Ohm
Lead Channel Impedance Value: 300 Ohm
Lead Channel Pacing Threshold Amplitude: 0.625 V
Lead Channel Pacing Threshold Amplitude: 1.375 V
Lead Channel Pacing Threshold Pulse Width: 0.4 ms
Lead Channel Pacing Threshold Pulse Width: 0.4 ms
Lead Channel Sensing Intrinsic Amplitude: 3 mV
Lead Channel Sensing Intrinsic Amplitude: 5.8 mV
Lead Channel Setting Pacing Amplitude: 1.625
Lead Channel Setting Pacing Amplitude: 1.625
Lead Channel Setting Pacing Pulse Width: 0.4 ms
Lead Channel Setting Sensing Sensitivity: 2 mV
Pulse Gen Model: 2110
Pulse Gen Serial Number: 7156802

## 2019-05-15 NOTE — Telephone Encounter (Signed)
Pt states he been trying to send a transmission since Monday. He spoke with [facility] and the issue is unresolved. He is reaching out to [place] again today.

## 2019-05-22 ENCOUNTER — Other Ambulatory Visit: Payer: Self-pay | Admitting: Family Medicine

## 2019-05-23 ENCOUNTER — Ambulatory Visit (INDEPENDENT_AMBULATORY_CARE_PROVIDER_SITE_OTHER): Payer: Medicare HMO

## 2019-05-23 VITALS — BP 150/75 | HR 76 | Ht 67.0 in | Wt 165.0 lb

## 2019-05-23 DIAGNOSIS — Z0000 Encounter for general adult medical examination without abnormal findings: Secondary | ICD-10-CM

## 2019-05-23 NOTE — Patient Instructions (Addendum)
[name] [name] , Thank you for taking time to participate in your Medicare Wellness Visit. I appreciate your ongoing commitment to your health goals. Please review the following plan we discussed and let me know if I can assist you in the future.   Screening recommendations/referrals: Colorectal Screening: colonoscopy completed 09/19/2014; due again 09/20/2019.   Vision and Dental Exams: Recommended annual ophthalmology exams for early detection of glaucoma and other disorders of the eye. He has this done at [facility]. Recommended annual dental exams for proper oral hygiene. He sees a dentist 2 x year.  Diabetic Exams: Diabetic Eye Exam: N/A Diabetic Foot Exam: N/A  Vaccinations: Influenza vaccine: completed 12/05/2018. Pneumococcal vaccine: completed 04/21/2007 & 08/31/2013. Tdap vaccine: completed 11/15/2017; repeat 11/16/2027. Shingles vaccine: Please obtain these from [facility] at least one month after you complete the covid vaccines. This is a series of two vaccines that are to be given 2-6 months apart.  Advanced directives: Advance directives discussed with you today.  Please bring a copy of your POA (Power of [place]) and/or Living Will to your next appointment.  Goals: Recommend to drink at least 6-8 8oz glasses of water per day.  Continue to exercise for at least 150 minutes per week.  Recommend to remove any items from the home that may cause slips or trips.  Next appointment: Please schedule your Annual Wellness Visit with your Nurse Health Advisor in one year.  Preventive Care 42 Years and Older, Male Preventive care refers to lifestyle choices and visits with your health care provider that can promote health and wellness. What does preventive care include?  A yearly physical exam. This is also called an annual well check.  Dental exams once or twice a year.  Routine eye exams. Ask your health care provider how often you should have your eyes checked.  Personal  lifestyle choices, including:  Daily care of your teeth and gums.  Regular physical activity.  Eating a healthy diet.  Avoiding tobacco and drug use.  Limiting alcohol use.  Practicing safe sex.  Taking low doses of aspirin every day if recommended by your health care provider..  Taking vitamin and mineral supplements as recommended by your health care provider. What happens during an annual well check? The services and screenings done by your health care provider during your annual well check will depend on your age, overall health, lifestyle risk factors, and family history of disease. Counseling  Your health care provider may ask you questions about your:  Alcohol use.  Tobacco use.  Drug use.  Emotional well-being.  Home and relationship well-being.  Sexual activity.  Eating habits.  History of falls.  Memory and ability to understand (cognition).  Work and work Statistician. Screening  You may have the following tests or measurements:  Height, weight, and BMI.  Blood pressure.  Lipid and cholesterol levels. These may be checked every 5 years, or more frequently if you are over 78 years old.  Skin check.  Lung cancer screening. You may have this screening every year starting at age 95 if you have a 30-pack-year history of smoking and currently smoke or have quit within the past 15 years.  Fecal occult blood test (FOBT) of the stool. You may have this test every year starting at age 70.  Flexible sigmoidoscopy or colonoscopy. You may have a sigmoidoscopy every 5 years or a colonoscopy every 10 years starting at age 35.  Prostate cancer screening. Recommendations will vary depending on your family history and other  risks.  Hepatitis C blood test.  Hepatitis B blood test.  Sexually transmitted disease (STD) testing.  Diabetes screening. This is done by checking your blood sugar (glucose) after you have not eaten for a while (fasting). You may have  this done every 1-3 years.  Abdominal aortic aneurysm (AAA) screening. You may need this if you are a current or former smoker.  Osteoporosis. You may be screened starting at age 3 if you are at high risk. Talk with your health care provider about your test results, treatment options, and if necessary, the need for more tests. Vaccines  Your health care provider may recommend certain vaccines, such as:  Influenza vaccine. This is recommended every year.  Tetanus, diphtheria, and acellular pertussis (Tdap, Td) vaccine. You may need a Td booster every 10 years.  Zoster vaccine. You may need this after age 51.  Pneumococcal 13-valent conjugate (PCV13) vaccine. One dose is recommended after age 16.  Pneumococcal polysaccharide (PPSV23) vaccine. One dose is recommended after age 56. Talk to your health care provider about which screenings and vaccines you need and how often you need them. This information is not intended to replace advice given to you by your health care provider. Make sure you discuss any questions you have with your health care provider. Document Released: 05/03/2015 Document Revised: 12/25/2015 Document Reviewed: 02/05/2015 Elsevier Interactive Patient Education  2017 [place] Prevention in the Home Falls can cause injuries. They can happen to people of all ages. There are many things you can do to make your home safe and to help prevent falls. What can I do on the outside of my home?  Regularly fix the edges of walkways and driveways and fix any cracks.  Remove anything that might make you trip as you walk through a door, such as a raised step or threshold.  Trim any bushes or trees on the path to your home.  Use bright outdoor lighting.  Clear any walking paths of anything that might make someone trip, such as rocks or tools.  Regularly check to see if handrails are loose or broken. Make sure that both sides of any steps have handrails.  Any raised  decks and porches should have guardrails on the edges.  Have any leaves, snow, or ice cleared regularly.  Use sand or salt on walking paths during winter.  Clean up any spills in your garage right away. This includes oil or grease spills. What can I do in the bathroom?  Use night lights.  Install grab bars by the toilet and in the tub and shower. Do not use towel bars as grab bars.  Use non-skid mats or decals in the tub or shower.  If you need to sit down in the shower, use a plastic, non-slip stool.  Keep the floor dry. Clean up any water that spills on the floor as soon as it happens.  Remove soap buildup in the tub or shower regularly.  Attach bath mats securely with double-sided non-slip rug tape.  Do not have throw rugs and other things on the floor that can make you trip. What can I do in the bedroom?  Use night lights.  Make sure that you have a light by your bed that is easy to reach.  Do not use any sheets or blankets that are too big for your bed. They should not hang down onto the floor.  Have a firm chair that has side arms. You can use this for support  while you get dressed.  Do not have throw rugs and other things on the floor that can make you trip. What can I do in the kitchen?  Clean up any spills right away.  Avoid walking on wet floors.  Keep items that you use a lot in easy-to-reach places.  If you need to reach something above you, use a strong step stool that has a grab bar.  Keep electrical cords out of the way.  Do not use floor polish or wax that makes floors slippery. If you must use wax, use non-skid floor wax.  Do not have throw rugs and other things on the floor that can make you trip. What can I do with my stairs?  Do not leave any items on the stairs.  Make sure that there are handrails on both sides of the stairs and use them. Fix handrails that are broken or loose. Make sure that handrails are as long as the stairways.  Check  any carpeting to make sure that it is firmly attached to the stairs. Fix any carpet that is loose or worn.  Avoid having throw rugs at the top or bottom of the stairs. If you do have throw rugs, attach them to the floor with carpet tape.  Make sure that you have a light switch at the top of the stairs and the bottom of the stairs. If you do not have them, ask someone to add them for you. What else can I do to help prevent falls?  Wear shoes that:  Do not have high heels.  Have rubber bottoms.  Are comfortable and fit you well.  Are closed at the toe. Do not wear sandals.  If you use a stepladder:  Make sure that it is fully opened. Do not climb a closed stepladder.  Make sure that both sides of the stepladder are locked into place.  Ask someone to hold it for you, if possible.  Clearly mark and make sure that you can see:  Any grab bars or handrails.  First and last steps.  Where the edge of each step is.  Use tools that help you move around (mobility aids) if they are needed. These include:  Canes.  Walkers.  Scooters.  Crutches.  Turn on the lights when you go into a dark area. Replace any light bulbs as soon as they burn out.  Set up your furniture so you have a clear path. Avoid moving your furniture around.  If any of your floors are uneven, fix them.  If there are any pets around you, be aware of where they are.  Review your medicines with your doctor. Some medicines can make you feel dizzy. This can increase your chance of falling. Ask your doctor what other things that you can do to help prevent falls. This information is not intended to replace advice given to you by your health care provider. Make sure you discuss any questions you have with your health care provider. Document Released: 01/31/2009 Document Revised: 09/12/2015 Document Reviewed: 05/11/2014 Elsevier Interactive Patient Education  2017 [employer].

## 2019-05-23 NOTE — Progress Notes (Signed)
This visit is being conducted via phone call due to the COVID-19 pandemic. This patient has given me verbal consent via phone to conduct this visit, patient states they are participating from their home address. Some vital signs may be absent or patient reported.   Patient identification: identified by name, DOB, and current address.  Location provider: [facility], Office Persons participating in the virtual visit:  Mr. Erroll "Nat" Flores and Franne Forts, LPN.    Subjective:   AVANTE WITTKOPP is a 79 y.o. male who presents for Medicare Annual/Subsequent preventive examination.  Mr. Murrie is doing well at this time. He is physically active at the gym and playing pickle ball. He eats 3 balanced meals every day. Additionally, he maintains care at [facility] as well.   Review of Systems:  No ROS; Annual Medicare Wellness subsequent visit       Objective:    Vitals: BP (!) 150/75   Pulse 76   Ht 5\' 7"  (1.702 m)   Wt 165 lb (74.8 kg)   BMI 25.84 kg/m   Body mass index is 25.84 kg/m.  Advanced Directives 05/23/2019 01/19/2018 01/12/2017 09/05/2014  Does Patient Have a Medical Advance Directive? Yes Yes Yes Yes  Type of Paramedic of [place];Living will - - [place];Living will  Does patient want to make changes to medical advance directive? No - Patient declined - - -  Copy of [place] in Chart? No - copy requested - - -    Tobacco Social History   Tobacco Use  Smoking Status Former Smoker  . Packs/day: 1.00  . Years: 20.00  . Pack years: 20.00  . Types: Cigarettes  . Quit date: 08/27/1982  . Years since quitting: 36.7  Smokeless Tobacco Never Used     Counseling given: Not Answered   Clinical Intake:     Pain : No/denies pain     BMI - recorded: 25.84 Nutritional Status: BMI 25 -29 Overweight Nutritional Risks: Other (Comment) Diabetes: No  How often do you need to have someone help you  when you read instructions, pamphlets, or other written materials from your doctor or pharmacy?: 1 - Never  Interpreter Needed?: No  Information entered by :: Franne Forts, LPN.  Past Medical History:  Diagnosis Date  . GERD (gastroesophageal reflux disease)   . HYPERLIPIDEMIA 07/17/2008  . HYPERTENSION 07/17/2008  . Sinus node dysfunction ([place]) 01/27/2011   a. s/p STJ dual chamber PPM   . Sleep apnea    doesnt wear cpap   Past Surgical History:  Procedure Laterality Date  . COLONOSCOPY    . PACEMAKER PLACEMENT     STJ dual chamber PPM implant 2011  . POLYPECTOMY     Family History  Problem Relation Age of Onset  . Hypertension Mother   . Stroke Mother   . Colitis Father   . Hypertension Father   . Other Father        esophageal stricture  . Alcohol abuse Maternal Aunt   . Colon cancer Neg Hx   . Rectal cancer Neg Hx   . Stomach cancer Neg Hx   . Esophageal cancer Neg Hx    Social History   Socioeconomic History  . Marital status: Married    Spouse name: Not on file  . Number of children: 1  . Years of education: Not on file  . Highest education level: Not on file  Occupational History  . Occupation: CO-OWNER  Employer: [employer]  Tobacco Use  . Smoking status: Former Smoker    Packs/day: 1.00    Years: 20.00    Pack years: 20.00    Types: Cigarettes    Quit date: 08/27/1982    Years since quitting: 36.7  . Smokeless tobacco: Never Used  Substance and Sexual Activity  . Alcohol use: Yes    Alcohol/week: 14.0 standard drinks    Types: 14 Standard drinks or equivalent per week    Comment: beer x 2 per day with meals/ may slow down   . Drug use: No  . Sexual activity: Not on file  Other Topics Concern  . Not on file  Social History Narrative   Married   [place] 2   2 children    3-4 c caffeine    Social Determinants of Health   Financial Resource Strain: Low Risk   . Difficulty of Paying Living Expenses: Not hard at all  Food Insecurity: No  Food Insecurity  . Worried About Charity fundraiser in the Last Year: Never true  . Ran Out of Food in the Last Year: Never true  Transportation Needs: No Transportation Needs  . Lack of Transportation (Medical): No  . Lack of Transportation (Non-Medical): No  Physical Activity: Sufficiently Active  . Days of Exercise per Week: 7 days  . Minutes of Exercise per Session: 90 min  Stress: No Stress Concern Present  . Feeling of Stress : Only a little  Social Connections: Unknown  . Frequency of Communication with Friends and Family: More than three times a week  . Frequency of Social Gatherings with Friends and Family: Once a week  . Attends Religious Services: Not on file  . Active Member of Clubs or Organizations: Not on file  . Attends Archivist Meetings: Not on file  . Marital Status: Married    Outpatient Encounter Medications as of 05/23/2019  Medication Sig  . benazepril (LOTENSIN) 40 MG tablet TAKE 1 TABLET BY MOUTH EVERY DAY  . rosuvastatin (CRESTOR) 5 MG tablet TAKE 0.5 TABLETS (2.5 MG TOTAL) BY MOUTH AT BEDTIME.  . ciclopirox (PENLAC) 8 % solution Apply topically at bedtime. Apply over nail and surrounding skin. (Patient not taking: Reported on 08/02/2018)   No facility-administered encounter medications on file as of 05/23/2019.    Activities of Daily Living No flowsheet data found.  Patient Care Team: [name], MD as PCP - General   Assessment:   This is a routine wellness examination for [place].  Exercise Activities and Dietary recommendations    Goals    . patient     Maintain health and curtail ETOH to 2 per day or less     . Patient Stated     Will maintain good habits in exercise and nutrition       Fall Risk Fall Risk  03/03/2019 01/19/2018 03/25/2017 01/12/2017 03/27/2016  Falls in the past year? 0 No Yes No No  Comment - - Emmi Telephone Survey: data to providers prior to load - Emmi Telephone Survey: data to providers prior to load   Number falls in past yr: - - 1 - -  Comment - - Emmi Telephone Survey Actual Response = 1 - -  Injury with Fall? - - No - -   Is the patient's home free of loose throw rugs in walkways, pet beds, electrical cords, etc?   yes      Grab bars in the bathroom? yes  Handrails on the stairs?   yes      Adequate lighting?   yes  Timed Get Up and Go Performed: N/A due to telephone visit  Depression Screen PHQ 2/9 Scores 05/23/2019 03/03/2019 01/19/2018 01/12/2017  PHQ - 2 Score 0 0 0 0    Cognitive Function MMSE - Mini Mental State Exam 01/19/2018  Not completed: (No Data)     6CIT Screen 05/23/2019  What Year? 0 points  What month? 0 points  What time? 0 points  Count back from 20 0 points  Months in reverse 0 points  Repeat phrase 0 points  Total Score 0    Immunization History  Administered Date(s) Administered  . Hepatitis A, Adult 02/12/2017  . Influenza Split 03/02/2011  . Influenza, High Dose Seasonal PF 12/22/2016, 01/07/2018, 12/05/2018  . Influenza,inj,Quad PF,6+ Mos 01/04/2015  . Influenza-Unspecified 12/19/2012, 01/17/2014, 01/07/2018  . Pneumococcal Conjugate-13 08/31/2013  . Pneumococcal Polysaccharide-23 04/21/2007  . Td 05/21/2006, 11/03/2007, 11/15/2017    Qualifies for Shingles Vaccine? He plans on getting this at [facility] after he completes covid vaccines.  Screening Tests Health Maintenance  Topic Date Due  . PNA vac Low Risk Adult (2 of 2 - PPSV23) 09/01/2014  . COLONOSCOPY  09/19/2019  . TETANUS/TDAP  11/16/2027  . INFLUENZA VACCINE  Completed   Cancer Screenings: Lung: Low Dose CT Chest recommended if Age 38-80 years, 30 pack-year currently smoking OR have quit w/in 15years. Patient does not qualify. Colorectal: yes  Additional Screenings: Hepatitis C Screening: N/A due to age    Plan:   Mr. Quigg has already received #1 of 2 covid vaccines (Moderna)  from [facility] and is scheduled to receive next one week of 05/29/2019. He will be able  to obtain the shingrix vaccines from [facility] as well. Lastly, he will be due for possibly his last colonoscopy in June 2021.   I have personally reviewed and noted the following in the patient's chart:   . Medical and social history . Use of alcohol, tobacco or illicit drugs  . Current medications and supplements . Functional ability and status . Nutritional status . Physical activity . Advanced directives . List of other physicians . Hospitalizations, surgeries, and ER visits in previous 12 months . Vitals . Screenings to include cognitive, depression, and falls . Referrals and appointments  In addition, I have reviewed and discussed with patient certain preventive protocols, quality metrics, and best practice recommendations. A written personalized care plan for preventive services as well as general preventive health recommendations were provided to patient.     Franne Forts, LPN  [id number]

## 2019-06-06 DIAGNOSIS — M25561 Pain in right knee: Secondary | ICD-10-CM | POA: Diagnosis not present

## 2019-06-30 ENCOUNTER — Encounter: Payer: Self-pay | Admitting: Family Medicine

## 2019-07-03 ENCOUNTER — Other Ambulatory Visit: Payer: Self-pay

## 2019-07-03 ENCOUNTER — Ambulatory Visit (INDEPENDENT_AMBULATORY_CARE_PROVIDER_SITE_OTHER): Payer: Medicare HMO | Admitting: Family Medicine

## 2019-07-03 ENCOUNTER — Encounter: Payer: Self-pay | Admitting: Family Medicine

## 2019-07-03 VITALS — BP 120/64 | HR 76 | Temp 97.6°F | Wt 162.9 lb

## 2019-07-03 DIAGNOSIS — M7121 Synovial cyst of popliteal space [Baker], right knee: Secondary | ICD-10-CM | POA: Diagnosis not present

## 2019-07-03 DIAGNOSIS — M25561 Pain in right knee: Secondary | ICD-10-CM

## 2019-07-03 NOTE — Progress Notes (Signed)
  Subjective:     Patient ID: [name] [name], male   DOB: 09/16/1940, 79 y.o.   MRN: [id number]  HPI  [name] [name] seen with right knee pain.  Onset back in January.  [name] [name] was playing pickle ball and planted his foot and twisted felt a pain medial aspect of the right knee.  [name] [name] applied some ice and Ace wrap.  [name] [name] was seen by [facility] by PA apparently back in January.  [name] [name] had x-rays which were unremarkable.  [name] [name] was given cortisone injection and had some really good improvement for several weeks.  [name] [name] now has some recurrent pain on the medial aspect of the knee.  Has had some persistent swelling.  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  Still playing pickle ball about 4 days/week.  Able to ambulate without much difficulty but is having persistent medial knee pain.  No locking or giving way.  No prior knee surgery.  Past Medical History:  Diagnosis Date  . GERD (gastroesophageal reflux disease)   . HYPERLIPIDEMIA 07/17/2008  . HYPERTENSION 07/17/2008  . Sinus node dysfunction ([place]) 01/27/2011   a. s/p STJ dual chamber PPM   . Sleep apnea    doesnt wear cpap   Past Surgical History:  Procedure Laterality Date  . COLONOSCOPY    . PACEMAKER PLACEMENT     STJ dual chamber PPM implant 2011  . POLYPECTOMY      reports that [name] [name] quit smoking about 36 years ago. His smoking use included cigarettes. [name] [name] has a 20.00 pack-year smoking history. [name] [name] has never used smokeless tobacco. [name] [name] reports current alcohol use of about 14.0 standard drinks of alcohol per week. [name] [name] reports that [name] [name] does not use drugs. family history includes Alcohol abuse in his maternal aunt; Colitis in his father; Hypertension in his father and mother; Other in his father; Stroke in his mother. No Known Allergies   Review of Systems  Neurological: Negative for weakness and numbness.       Objective:   Physical Exam Vitals reviewed.  Constitutional:      Appearance: Normal  appearance.  Cardiovascular:     Rate and Rhythm: Normal rate and regular rhythm.  Musculoskeletal:     Comments: Right knee reveals no erythema.  No ecchymosis.  No warmth.  Full range of motion.  [name] [name] has small effusion.  [name] [name] has some mild medial joint line tenderness.  [name] [name] has swelling posterior knee medially consistent with Baker's cyst  Neurological:     Mental Status: [name] [name] is alert.        Assessment:     Acute right knee pain with onset back in January.  [name] [name] has small effusion and evidence for Baker's cyst posteriorly and suspect probable medial meniscus tear.  Did have initial steroid injection back in January per orthopedics with some transient improvement    Plan:     -Continue with some icing as needed and set up sports medicine referral -Avoid aggravating activities such as squatting and twisting  [name] MD [facility] at [facility]

## 2019-07-03 NOTE — Patient Instructions (Signed)
Baker Cyst ° °A Baker cyst, also called a popliteal cyst, is a growth that forms at the back of the knee. The cyst forms when the fluid-filled sac (bursa) that cushions the knee joint becomes enlarged. °What are the causes? °In most cases, a Baker cyst results from another knee problem that causes swelling inside the knee. This makes the fluid inside the knee joint (synovial fluid) flow into the bursa behind the knee, causing the bursa to enlarge. °What increases the risk? °You may be more likely to develop a Baker cyst if you already have a knee problem, such as: °· A tear in cartilage that cushions the knee joint (meniscal tear). °· A tear in the tissues that connect the bones of the knee joint (ligament tear). °· Knee swelling from osteoarthritis, rheumatoid arthritis, or gout. °What are the signs or symptoms? °The main symptom of this condition is a lump behind the knee. This may be the only symptom of the condition. The lump may be painful, especially when the knee is straightened. If the lump is painful, the pain may come and go. The knee may also be stiff. °Symptoms may quickly get more severe if the cyst breaks open (ruptures). If the cyst ruptures, you may feel the following in your knee and calf: °· Sudden or worsening pain. °· Swelling. °· Bruising. °· Redness in the calf. °A Baker cyst does not always cause symptoms. °How is this diagnosed? °This condition may be diagnosed based on your symptoms and medical history. Your health care provider will also do a physical exam. This may include: °· Feeling the cyst to check whether it is tender. °· Checking your knee for signs of another knee condition that causes swelling. °You may have imaging tests, such as: °· X-rays. °· MRI. °· Ultrasound. °How is this treated? °A Baker cyst that is not painful may go away without treatment. If the cyst gets large or painful, it will likely get better if the underlying knee problem is treated. °If needed, treatment for a  Baker cyst may include: °· Resting. °· Keeping weight off of the knee. This means not leaning on the knee to support your body weight. °· Taking NSAIDs, such as ibuprofen, to reduce pain and swelling. °· Having a procedure to drain the fluid from the cyst with a needle (aspiration). You may also get an injection of a medicine that reduces swelling (steroid). °· Having surgery. This may be needed if other treatments do not work. This usually involves correcting knee damage and removing the cyst. °Follow these instructions at home: ° °Activity °· Rest as told by your health care provider. °· Avoid activities that make pain or swelling worse. °· Return to your normal activities as told by your health care provider. Ask your health care provider what activities are safe for you. °· Do not use the injured limb to support your body weight until your health care provider says that you can. Use crutches as told by your health care provider. °General instructions °· Take over-the-counter and prescription medicines only as told by your health care provider. °· Keep all follow-up visits as told by your health care provider. This is important. °Contact a health care provider if: °· You have knee pain, stiffness, or swelling that does not get better. °Get help right away if: °· You have sudden or worsening pain and swelling in your calf area. °Summary °· A Baker cyst, also called a popliteal cyst, is a growth that forms at the   back of the knee. °· In most cases, a Baker cyst results from another knee problem that causes swelling inside the knee. °· A Baker cyst that is not painful may go away without treatment. °· If needed, treatment for a Baker cyst may include resting, keeping weight off of the knee, medicines, or draining fluid from the cyst. °· Surgery may be needed if other treatments are not effective. °This information is not intended to replace advice given to you by your health care provider. Make sure you discuss any  questions you have with your health care provider. °Document Revised: 08/19/2018 Document Reviewed: 08/19/2018 °Elsevier Patient Education © 2020 [employer] ° ° ° °

## 2019-07-04 ENCOUNTER — Encounter: Payer: Self-pay | Admitting: Family Medicine

## 2019-07-13 ENCOUNTER — Other Ambulatory Visit: Payer: Self-pay

## 2019-07-13 ENCOUNTER — Encounter: Payer: Self-pay | Admitting: Family Medicine

## 2019-07-13 ENCOUNTER — Ambulatory Visit (INDEPENDENT_AMBULATORY_CARE_PROVIDER_SITE_OTHER): Payer: Medicare HMO

## 2019-07-13 ENCOUNTER — Ambulatory Visit: Payer: Medicare HMO | Admitting: Family Medicine

## 2019-07-13 VITALS — BP 118/82 | HR 50 | Ht 67.0 in | Wt 162.0 lb

## 2019-07-13 DIAGNOSIS — G8929 Other chronic pain: Secondary | ICD-10-CM

## 2019-07-13 DIAGNOSIS — M7121 Synovial cyst of popliteal space [Baker], right knee: Secondary | ICD-10-CM

## 2019-07-13 DIAGNOSIS — M1711 Unilateral primary osteoarthritis, right knee: Secondary | ICD-10-CM | POA: Insufficient documentation

## 2019-07-13 DIAGNOSIS — S83241A Other tear of medial meniscus, current injury, right knee, initial encounter: Secondary | ICD-10-CM | POA: Diagnosis not present

## 2019-07-13 DIAGNOSIS — M2550 Pain in unspecified joint: Secondary | ICD-10-CM

## 2019-07-13 DIAGNOSIS — M25561 Pain in right knee: Secondary | ICD-10-CM

## 2019-07-13 DIAGNOSIS — Z800 Family history of malignant neoplasm of digestive organs: Secondary | ICD-10-CM | POA: Diagnosis not present

## 2019-07-13 DIAGNOSIS — R7989 Other specified abnormal findings of blood chemistry: Secondary | ICD-10-CM | POA: Diagnosis not present

## 2019-07-13 LAB — COMPREHENSIVE METABOLIC PANEL
ALT: 20 U/L (ref 0–53)
AST: 23 U/L (ref 0–37)
Albumin: 4.2 g/dL (ref 3.5–5.2)
Alkaline Phosphatase: 52 U/L (ref 39–117)
BUN: 16 mg/dL (ref 6–23)
CO2: 31 mEq/L (ref 19–32)
Calcium: 9 mg/dL (ref 8.4–10.5)
Chloride: 97 mEq/L (ref 96–112)
Creatinine, Ser: 0.83 mg/dL (ref 0.40–1.50)
GFR: 90 mL/min (ref >60.00)
Glucose, Bld: 93 mg/dL (ref 70–99)
Potassium: 4.9 mEq/L (ref 3.5–5.1)
Sodium: 132 mEq/L — ABNORMAL LOW (ref 135–145)
Total Bilirubin: 0.6 mg/dL (ref 0.2–1.2)
Total Protein: 6.5 g/dL (ref 6.0–8.3)

## 2019-07-13 LAB — IBC PANEL
Iron: 78 ug/dL (ref 42–165)
Saturation Ratios: 22.1 % (ref 20.0–50.0)
Transferrin: 252 mg/dL (ref 212.0–360.0)

## 2019-07-13 LAB — CBC WITH DIFFERENTIAL/PLATELET
Basophils Absolute: 0.1 10*3/uL (ref 0.0–0.1)
Basophils Relative: 1.1 % (ref 0.0–3.0)
Eosinophils Absolute: 0.1 10*3/uL (ref 0.0–0.7)
Eosinophils Relative: 1.6 % (ref 0.0–5.0)
HCT: 40.6 % (ref 39.0–52.0)
Hemoglobin: 13.9 g/dL (ref 13.0–17.0)
Lymphocytes Relative: 22.8 % (ref 12.0–46.0)
Lymphs Abs: 1.2 10*3/uL (ref 0.7–4.0)
MCHC: 34.2 g/dL (ref 30.0–36.0)
MCV: 94.8 fl (ref 78.0–100.0)
Monocytes Absolute: 0.5 10*3/uL (ref 0.1–1.0)
Monocytes Relative: 9 % (ref 3.0–12.0)
Neutro Abs: 3.6 10*3/uL (ref 1.4–7.7)
Neutrophils Relative %: 65.5 % (ref 43.0–77.0)
Platelets: 174 10*3/uL (ref 150.0–400.0)
RBC: 4.29 Mil/uL (ref 4.22–5.81)
RDW: 13.8 % (ref 11.5–15.5)
WBC: 5.5 10*3/uL (ref 4.0–10.5)

## 2019-07-13 LAB — URIC ACID: Uric Acid, Serum: 4.1 mg/dL (ref 4.0–7.8)

## 2019-07-13 LAB — PSA: PSA: 0.88 ng/mL (ref 0.10–4.00)

## 2019-07-13 LAB — SEDIMENTATION RATE: Sed Rate: 23 mm/hr — ABNORMAL HIGH (ref 0–20)

## 2019-07-13 LAB — TESTOSTERONE: Testosterone: 569.22 ng/dL (ref 300.00–890.00)

## 2019-07-13 LAB — FERRITIN: Ferritin: 43.5 ng/mL (ref 22.0–322.0)

## 2019-07-13 LAB — VITAMIN D 25 HYDROXY (VIT D DEFICIENCY, FRACTURES): VITD: 31.81 ng/mL (ref 30.00–100.00)

## 2019-07-13 NOTE — Patient Instructions (Addendum)
Good to see you.  Labs today Drained Baker's cyst Ice 20 minutes 2 times daily. Usually after activity and before bed. Exercises 3 times a week.  Thurmond Butts will call about brace Turmeric 500mg  daily  Tart cherry extract 1200mg  at night Vitamin D 2000 IU daily  See me again in 4-5 weeks

## 2019-07-13 NOTE — Assessment & Plan Note (Signed)
New undiagnosed problem.  Patient did have a Baker's cyst aspirated today and steroid injection given.  Could be candidate may be for viscosupplementation.  Due to family history of liver cancer will get a different laboratory work-up with patient's abnormal weight loss he states.  Patient's will get a medial unloader brace secondary to the abnormal thigh to calf ratio and the instability.  We discussed topical anti-inflammatories and over-the-counter medications.  Follow-up again in 4 to 6 weeks.

## 2019-07-13 NOTE — Assessment & Plan Note (Signed)
Baker's cyst aspiration given.Discussed icing regimen compression, repetitive injections secondary to the meniscal tear and the arthritis.  Follow-up again in 4 to 6 weeks

## 2019-07-13 NOTE — Progress Notes (Signed)
[place] Phone: [phone] Subjective:    I'm seeing this patient by the request  of:  [name], MD  CC: Right knee pain  [id number]  [name] [name] is a 79 y.o. male coming in with complaint of right knee pain. Patient states that he was playing pickleball on January 19th and he planted, twisted and felt a pop. Has cortisone injection from [facility]. Pain went away but came back. Went back to see Dr and didn't give him any further treatments. Is able to play but uses ice regularly. Was told he has Baker's cyst as well. Pain over medial and anterior aspect.    Xray 05/09/2019 [facility] reviewed x-rays of right knee showing athersclerosis but no fracture or signs of osteoarthritis  exam suggests possible meniscal tear and hamstring strain  reviewed options and pt would like to try and injection for pain relief and watch his symptoms  recommend MRI for worsening symptoms  pt in agreement  denies any need for pain medications  gentle activity as tolerated  Patient educational handouts: No information available.   Past Medical History:  Diagnosis Date  . GERD (gastroesophageal reflux disease)   . HYPERLIPIDEMIA 07/17/2008  . HYPERTENSION 07/17/2008  . Sinus node dysfunction ([place]) 01/27/2011   a. s/p STJ dual chamber PPM   . Sleep apnea    doesnt wear cpap   Past Surgical History:  Procedure Laterality Date  . COLONOSCOPY    . PACEMAKER PLACEMENT     STJ dual chamber PPM implant 2011  . POLYPECTOMY     Social History   Socioeconomic History  . Marital status: Married    Spouse name: Not on file  . Number of children: 1  . Years of education: Not on file  . Highest education level: Not on file  Occupational History  . Occupation: CO-OWNER    Employer:  [employer]  Tobacco Use  . Smoking status: Former Smoker    Packs/day: 1.00    Years: 20.00    Pack years: 20.00   Types: Cigarettes    Quit date: 08/27/1982    Years since quitting: 36.9  . Smokeless tobacco: Never Used  Substance and Sexual Activity  . Alcohol use: Yes    Alcohol/week: 14.0 standard drinks    Types: 14 Standard drinks or equivalent per week    Comment: beer x 2 per day with meals/ may slow down   . Drug use: No  . Sexual activity: Not on file  Other Topics Concern  . Not on file  Social History Narrative   Married   [place] 2   2 children    3-4 c caffeine    Social Determinants of Health   Financial Resource Strain: Low Risk   . Difficulty of Paying Living Expenses: Not hard at all  Food Insecurity: No Food Insecurity  . Worried About Charity fundraiser in the Last Year: Never true  . Ran Out of Food in the Last Year: Never true  Transportation Needs: No Transportation Needs  . Lack of Transportation (Medical): No  . Lack of Transportation (Non-Medical): No  Physical Activity: Sufficiently Active  . Days of Exercise per Week: 7 days  . Minutes of Exercise per Session: 90 min  Stress: No Stress Concern Present  . Feeling of Stress : Only a little  Social Connections: Unknown  . Frequency of Communication with Friends and Family: More than  three times a week  . Frequency of Social Gatherings with Friends and Family: Once a week  . Attends Religious Services: Not on file  . Active Member of Clubs or Organizations: Not on file  . Attends Archivist Meetings: Not on file  . Marital Status: Married   No Known Allergies Family History  Problem Relation Age of Onset  . Hypertension Mother   . Stroke Mother   . Colitis Father   . Hypertension Father   . Other Father        esophageal stricture  . Alcohol abuse Maternal Aunt   . Colon cancer Neg Hx   . Rectal cancer Neg Hx   . Stomach cancer Neg Hx   . Esophageal cancer Neg Hx      Current Outpatient Medications (Cardiovascular):  .  benazepril (LOTENSIN) 40 MG tablet, TAKE 1 TABLET BY MOUTH EVERY DAY .   rosuvastatin (CRESTOR) 5 MG tablet, TAKE 0.5 TABLETS (2.5 MG TOTAL) BY MOUTH AT BEDTIME.     Current Outpatient Medications (Other):  .  ciclopirox (PENLAC) 8 % solution, Apply topically at bedtime. Apply over nail and surrounding skin.   Reviewed prior external information including notes and imaging from  primary care provider As well as notes that were available from care everywhere and other healthcare systems.  Past medical history, social, surgical and family history all reviewed in electronic medical record.  No pertanent information unless stated regarding to the chief complaint.   Review of Systems:  No headache, visual changes, nausea, vomiting, diarrhea, constipation, dizziness, abdominal pain, skin rash, fevers, chills, night sweats, weight loss, swollen lymph nodes, body aches, joint swelling, chest pain, shortness of breath, mood changes. POSITIVE muscle aches  Objective  Blood pressure 118/82, pulse (!) 50, height 5\' 7"  (1.702 m), weight 162 lb (73.5 kg), SpO2 99 %.   General: No apparent distress alert and oriented x3 mood and affect normal, dressed appropriately.  HEENT: Pupils equal, extraocular movements intact  Respiratory: Patient's speak in full sentences and does not appear short of breath  Cardiovascular: No lower extremity edema, non tender, no erythema  Neuro: Cranial nerves II through XII are intact, neurovascularly intact in all extremities with 2+ DTRs and 2+ pulses.  Gait normal with good balance and coordination.  MSK:  Non tender with full range of motion and good stability and symmetric strength and tone of shoulders, elbows, wrist, hip and ankles bilaterally.  Right knee exam shows the patient does have tenderness to palpation over the medial joint line.  Positive McMurray's.  Mild instability noted with valgus varus and valgus force.  Mild abnormal thigh to calf ratio noted.  Near full range of motion though but does lack the last 10 degrees of flexion.   Baker's cyst palpated in the posterior region.  Limited musculoskeletal ultrasound was performed and interpreted by Lyndal Pulley  Limited ultrasound of patient's knee shows the patient does have a large medial meniscal tear noted.  Mild displacement noted of at least 25%.  In addition to this large Baker's cyst noted in the popliteal fossa.  Moderate narrowing of the joint space medially.  Procedure: Real-time Ultrasound Guided Injection of right knee Device: GE Logiq Q7 Ultrasound guided injection is preferred based studies that show increased duration, increased effect, greater accuracy, decreased procedural pain, increased response rate, and decreased cost with ultrasound guided versus blind injection.  Verbal informed consent obtained.  Time-out conducted.  Noted no overlying erythema, induration, or other signs  of local infection.  Skin prepped in a sterile fashion.  Local anesthesia: Topical Ethyl chloride.  With sterile technique and under real time ultrasound guidance: With a 22-gauge 2 inch needle patient was injected with 4 cc of 0.5% Marcaine aspirated 25 cc of straw like colored fluid then injected 1 cc of Kenalog 40 mg/dL. This was from a posterior approach.  Completed without difficulty  Pain immediately resolved suggesting accurate placement of the medication.  Advised to call if fevers/chills, erythema, induration, drainage, or persistent bleeding.  Images permanently stored and available for review in the ultrasound unit.  Impression: Technically successful ultrasound guided injection.   Impression and Recommendations:     This case required medical decision making of moderate complexity. The above documentation has been reviewed and is accurate and complete Lyndal Pulley, DO       Note: This dictation was prepared with Dragon dictation along with smaller phrase technology. Any transcriptional errors that result from this process are unintentional.

## 2019-07-14 LAB — PTH, INTACT AND CALCIUM
Calcium: 9 mg/dL (ref 8.6–10.3)
PTH: 34 pg/mL (ref 14–64)

## 2019-07-14 LAB — AFP TUMOR MARKER: AFP-Tumor Marker: 3 ng/mL (ref <6.1)

## 2019-07-14 LAB — CALCIUM, IONIZED: Calcium, Ion: 4.97 mg/dL (ref 4.8–5.6)

## 2019-07-14 LAB — ANA: Anti Nuclear Antibody (ANA): NEGATIVE

## 2019-07-19 DIAGNOSIS — R69 Illness, unspecified: Secondary | ICD-10-CM | POA: Diagnosis not present

## 2019-07-19 DIAGNOSIS — Z85828 Personal history of other malignant neoplasm of skin: Secondary | ICD-10-CM | POA: Diagnosis not present

## 2019-07-19 DIAGNOSIS — M1990 Unspecified osteoarthritis, unspecified site: Secondary | ICD-10-CM | POA: Diagnosis not present

## 2019-07-19 DIAGNOSIS — Z809 Family history of malignant neoplasm, unspecified: Secondary | ICD-10-CM | POA: Diagnosis not present

## 2019-07-19 DIAGNOSIS — Z87891 Personal history of nicotine dependence: Secondary | ICD-10-CM | POA: Diagnosis not present

## 2019-07-19 DIAGNOSIS — Z823 Family history of stroke: Secondary | ICD-10-CM | POA: Diagnosis not present

## 2019-07-19 DIAGNOSIS — I10 Essential (primary) hypertension: Secondary | ICD-10-CM | POA: Diagnosis not present

## 2019-07-19 DIAGNOSIS — G8929 Other chronic pain: Secondary | ICD-10-CM | POA: Diagnosis not present

## 2019-07-19 DIAGNOSIS — Z950 Presence of cardiac pacemaker: Secondary | ICD-10-CM | POA: Diagnosis not present

## 2019-07-19 DIAGNOSIS — E785 Hyperlipidemia, unspecified: Secondary | ICD-10-CM | POA: Diagnosis not present

## 2019-07-21 DIAGNOSIS — M1711 Unilateral primary osteoarthritis, right knee: Secondary | ICD-10-CM | POA: Diagnosis not present

## 2019-08-07 ENCOUNTER — Ambulatory Visit (INDEPENDENT_AMBULATORY_CARE_PROVIDER_SITE_OTHER): Payer: Medicare HMO | Admitting: *Deleted

## 2019-08-07 DIAGNOSIS — I495 Sick sinus syndrome: Secondary | ICD-10-CM | POA: Diagnosis not present

## 2019-08-07 DIAGNOSIS — D3001 Benign neoplasm of right kidney: Secondary | ICD-10-CM | POA: Diagnosis not present

## 2019-08-08 LAB — CUP PACEART REMOTE DEVICE CHECK
Battery Remaining Longevity: 73 mo
Battery Remaining Percentage: 51 %
Battery Voltage: 2.87 V
Brady Statistic AP VP Percent: 1 %
Brady Statistic AP VS Percent: 2.1 %
Brady Statistic AS VP Percent: 1 %
Brady Statistic AS VS Percent: 98 %
Brady Statistic RA Percent Paced: 2 %
Brady Statistic RV Percent Paced: 1 %
Date Time Interrogation Session: 20210420070235
Implantable Lead Implant Date: 20111010
Implantable Lead Implant Date: 20111010
Implantable Lead Location: 753859
Implantable Lead Location: 753860
Implantable Pulse Generator Implant Date: 20111010
Lead Channel Impedance Value: 300 Ohm
Lead Channel Impedance Value: 330 Ohm
Lead Channel Pacing Threshold Amplitude: 0.75 V
Lead Channel Pacing Threshold Amplitude: 1.25 V
Lead Channel Pacing Threshold Pulse Width: 0.4 ms
Lead Channel Pacing Threshold Pulse Width: 0.4 ms
Lead Channel Sensing Intrinsic Amplitude: 2.8 mV
Lead Channel Sensing Intrinsic Amplitude: 5 mV
Lead Channel Setting Pacing Amplitude: 1.5 V
Lead Channel Setting Pacing Amplitude: 1.75 V
Lead Channel Setting Pacing Pulse Width: 0.4 ms
Lead Channel Setting Sensing Sensitivity: 2 mV
Pulse Gen Model: 2110
Pulse Gen Serial Number: 7156802

## 2019-08-08 NOTE — Progress Notes (Signed)
Ppm Remote  

## 2019-08-09 NOTE — Progress Notes (Signed)
[place] Phone: [phone] Subjective:    I'm seeing this patient by the request  of:  [name], MD  CC: Knee pain, shoulder pain  [id number]   07/13/2019 New undiagnosed problem.  Patient did have a Baker's cyst aspirated today and steroid injection given.  Could be candidate may be for viscosupplementation.  Due to family history of liver cancer will get a different laboratory work-up with patient's abnormal weight loss he states.  Patient's will get a medial unloader brace secondary to the abnormal thigh to calf ratio and the instability.  We discussed topical anti-inflammatories and over-the-counter medications.  Follow-up again in 4 to 6 weeks.  Update 08/10/2019 [name] [name] is a 79 y.o. male coming in with complaint of right knee, Baker's cyst. Patient states the knee is better is unsure if he is wearing the brace correctly.  Patient states that feeling 80% better and can do more activity.  Patient does complain more of her right shoulder.  Having significant discomfort with cough.  States that the back pain has become more difficult.  Rates the severity of pain is 8 out of 10 sometimes.    Past Medical History:  Diagnosis Date  . GERD (gastroesophageal reflux disease)   . HYPERLIPIDEMIA 07/17/2008  . HYPERTENSION 07/17/2008  . Sinus node dysfunction ([place]) 01/27/2011   a. s/p STJ dual chamber PPM   . Sleep apnea    doesnt wear cpap   Past Surgical History:  Procedure Laterality Date  . COLONOSCOPY    . PACEMAKER PLACEMENT     STJ dual chamber PPM implant 2011  . POLYPECTOMY     Social History   Socioeconomic History  . Marital status: Married    Spouse name: Not on file  . Number of children: 1  . Years of education: Not on file  . Highest education level: Not on file  Occupational History  . Occupation: CO-OWNER    Employer: [employer]  Tobacco Use  . Smoking status:  Former Smoker    Packs/day: 1.00    Years: 20.00    Pack years: 20.00    Types: Cigarettes    Quit date: 08/27/1982    Years since quitting: 36.9  . Smokeless tobacco: Never Used  Substance and Sexual Activity  . Alcohol use: Yes    Alcohol/week: 14.0 standard drinks    Types: 14 Standard drinks or equivalent per week    Comment: beer x 2 per day with meals/ may slow down   . Drug use: No  . Sexual activity: Not on file  Other Topics Concern  . Not on file  Social History Narrative   Married   [place] 2   2 children    3-4 c caffeine    Social Determinants of Health   Financial Resource Strain: Low Risk   . Difficulty of Paying Living Expenses: Not hard at all  Food Insecurity: No Food Insecurity  . Worried About Charity fundraiser in the Last Year: Never true  . Ran Out of Food in the Last Year: Never true  Transportation Needs: No Transportation Needs  . Lack of Transportation (Medical): No  . Lack of Transportation (Non-Medical): No  Physical Activity: Sufficiently Active  . Days of Exercise per Week: 7 days  . Minutes of Exercise per Session: 90 min  Stress: No Stress Concern Present  . Feeling of Stress : Only a little  Social  Connections: Unknown  . Frequency of Communication with Friends and Family: More than three times a week  . Frequency of Social Gatherings with Friends and Family: Once a week  . Attends Religious Services: Not on file  . Active Member of Clubs or Organizations: Not on file  . Attends Archivist Meetings: Not on file  . Marital Status: Married   No Known Allergies Family History  Problem Relation Age of Onset  . Hypertension Mother   . Stroke Mother   . Colitis Father   . Hypertension Father   . Other Father        esophageal stricture  . Alcohol abuse Maternal Aunt   . Colon cancer Neg Hx   . Rectal cancer Neg Hx   . Stomach cancer Neg Hx   . Esophageal cancer Neg Hx      Current Outpatient Medications (Cardiovascular):    .  benazepril (LOTENSIN) 40 MG tablet, TAKE 1 TABLET BY MOUTH EVERY DAY .  rosuvastatin (CRESTOR) 5 MG tablet, TAKE 0.5 TABLETS (2.5 MG TOTAL) BY MOUTH AT BEDTIME.     Current Outpatient Medications (Other):  .  ciclopirox (PENLAC) 8 % solution, Apply topically at bedtime. Apply over nail and surrounding skin.   Reviewed prior external information including notes and imaging from  primary care provider As well as notes that were available from care everywhere and other healthcare systems.  Past medical history, social, surgical and family history all reviewed in electronic medical record.  No pertanent information unless stated regarding to the chief complaint.   Review of Systems:  No headache, visual changes, nausea, vomiting, diarrhea, constipation, dizziness, abdominal pain, skin rash, fevers, chills, night sweats, weight loss, swollen lymph nodes, body aches, joint swelling, chest pain, shortness of breath, mood changes. POSITIVE muscle aches  Objective  Blood pressure 120/60, pulse 73, height 5\' 7"  (1.702 m), weight 160 lb (72.6 kg), SpO2 99 %.   General: No apparent distress alert and oriented x3 mood and affect normal, dressed appropriately.  HEENT: Pupils equal, extraocular movements intact  Respiratory: Patient's speak in full sentences and does not appear short of breath  Cardiovascular: No lower extremity edema, non tender, no erythema  Neuro: Cranial nerves II through XII are intact, neurovascularly intact in all extremities with 2+ DTRs and 2+ pulses.  Gait improvement in gait with brace MSK: Patient still has some tenderness to palpation in the knee itself.  More on the medial joint line.  Did not stress knee secondary to patient wearing the brace.  Right shoulder does show significant decrease in range of motion.  Crepitus noted with range of motion.  Patient has 4 out of 5 strength compared to contralateral side.    Impression and Recommendations:     This case  required medical decision making of moderate complexity. The above documentation has been reviewed and is accurate and complete Lyndal Pulley, DO       Note: This dictation was prepared with Dragon dictation along with smaller phrase technology. Any transcriptional errors that result from this process are unintentional.

## 2019-08-10 ENCOUNTER — Ambulatory Visit: Payer: Medicare HMO | Admitting: Family Medicine

## 2019-08-10 ENCOUNTER — Other Ambulatory Visit: Payer: Self-pay

## 2019-08-10 ENCOUNTER — Encounter: Payer: Self-pay | Admitting: Family Medicine

## 2019-08-10 DIAGNOSIS — M1711 Unilateral primary osteoarthritis, right knee: Secondary | ICD-10-CM

## 2019-08-10 DIAGNOSIS — M12811 Other specific arthropathies, not elsewhere classified, right shoulder: Secondary | ICD-10-CM | POA: Diagnosis not present

## 2019-08-10 DIAGNOSIS — M75101 Unspecified rotator cuff tear or rupture of right shoulder, not specified as traumatic: Secondary | ICD-10-CM

## 2019-08-10 NOTE — Patient Instructions (Addendum)
Good to see you  Glad the knee is doing better Contact [name] for brace See me in May for PRP in shoulder

## 2019-08-11 ENCOUNTER — Encounter: Payer: Self-pay | Admitting: Family Medicine

## 2019-08-11 DIAGNOSIS — M12811 Other specific arthropathies, not elsewhere classified, right shoulder: Secondary | ICD-10-CM | POA: Insufficient documentation

## 2019-08-11 NOTE — Assessment & Plan Note (Signed)
Patient does have what appears to be more of a rotator cuff arthropathy.  Discussed with patient in great length about icing regimen, home exercise, which activities to doing which wants to avoid.  Patient should do relatively well with injections could be possible.  Discussed PRP and patient will consider this.

## 2019-08-11 NOTE — Assessment & Plan Note (Signed)
Doing much better after the operation of her cyst as well as a custom brace helping with the instability of the knee.  Patient should do relatively well and can follow-up with me again as needed.

## 2019-08-24 ENCOUNTER — Other Ambulatory Visit: Payer: Self-pay

## 2019-08-24 ENCOUNTER — Telehealth: Payer: Self-pay

## 2019-08-24 ENCOUNTER — Telehealth (INDEPENDENT_AMBULATORY_CARE_PROVIDER_SITE_OTHER): Payer: Medicare HMO | Admitting: Internal Medicine

## 2019-08-24 VITALS — BP 127/54 | HR 52 | Wt 160.0 lb

## 2019-08-24 DIAGNOSIS — Z950 Presence of cardiac pacemaker: Secondary | ICD-10-CM

## 2019-08-24 DIAGNOSIS — R69 Illness, unspecified: Secondary | ICD-10-CM | POA: Diagnosis not present

## 2019-08-24 DIAGNOSIS — R55 Syncope and collapse: Secondary | ICD-10-CM | POA: Diagnosis not present

## 2019-08-24 DIAGNOSIS — I495 Sick sinus syndrome: Secondary | ICD-10-CM

## 2019-08-24 NOTE — Patient Instructions (Signed)

## 2019-08-24 NOTE — Progress Notes (Signed)
Electrophysiology TeleHealth Note   Due to national recommendations of social distancing due to COVID 19, an audio/video telehealth visit is felt to be most appropriate for this patient at this time.  See MyChart message from today for the patient's consent to telehealth for [facility].   Date:  08/24/2019   ID:  [name] [name], DOB 08/14/1940, MRN [id number]  Location: patient's home  Provider location: [address], [place]  Evaluation Performed: Follow-up visit  PCP:  [name], MD  Cardiologist:     Electrophysiologist:  SK   Chief Complaint:  Syncope   History of Present Illness:    [name] [name] is a 79 y.o. male who presents via audio/video conferencing for a telehealth visit today.  Since last being seen in our clinic for syncope and sinus node dysfunction--s/p pacemaker St Jude 10/11, the patient reports doing well  The patient denies chest pain, shortness of breath, nocturnal dyspnea, orthopnea or peripheral edema.  There have been no palpitations, lightheadedness or syncope.    Playing pickleball.   Vaccinated x 2   The patient denies symptoms of fevers, chills, cough, or new SOB worrisome for COVID 19.    Past Medical History:  Diagnosis Date  . GERD (gastroesophageal reflux disease)   . HYPERLIPIDEMIA 07/17/2008  . HYPERTENSION 07/17/2008  . Sinus node dysfunction ([place]) 01/27/2011   a. s/p STJ dual chamber PPM   . Sleep apnea    doesnt wear cpap    Past Surgical History:  Procedure Laterality Date  . COLONOSCOPY    . PACEMAKER PLACEMENT     STJ dual chamber PPM implant 2011  . POLYPECTOMY      Current Outpatient Medications  Medication Sig Dispense Refill  . benazepril (LOTENSIN) 40 MG tablet TAKE 1 TABLET BY MOUTH EVERY DAY 90 tablet 2  . cholecalciferol (VITAMIN D3) 25 MCG (1000 UNIT) tablet Take 1,000 Units by mouth daily.    . melatonin 5 MG TABS Take 5 mg by mouth.    Marland Kitchen omeprazole (PRILOSEC) 20 MG capsule Take 20  mg by mouth daily.    . rosuvastatin (CRESTOR) 5 MG tablet TAKE 0.5 TABLETS (2.5 MG TOTAL) BY MOUTH AT BEDTIME. 45 tablet 3  . Turmeric (QC TUMERIC COMPLEX PO) Take by mouth.    . ciclopirox (PENLAC) 8 % solution Apply topically at bedtime. Apply over nail and surrounding skin. (Patient not taking: Reported on 08/24/2019) 6.6 mL 5   No current facility-administered medications for this visit.    Allergies:   Patient has no known allergies.   Social History:  The patient  reports that he quit smoking about 37 years ago. His smoking use included cigarettes. He has a 20.00 pack-year smoking history. He has never used smokeless tobacco. He reports current alcohol use of about 14.0 standard drinks of alcohol per week. He reports that he does not use drugs.   Family History:  The patient's   family history includes Alcohol abuse in his maternal aunt; Colitis in his father; Hypertension in his father and mother; Other in his father; Stroke in his mother.   ROS:  Please see the history of present illness.   All other systems are personally reviewed and negative.    Exam:    Vital Signs:  BP (!) 127/54   Pulse (!) 52   Wt 160 lb (72.6 kg)   BMI 25.06 kg/m     Labs/Other Tests and Data Reviewed:  Recent Labs: 07/13/2019: ALT 20; BUN 16; Creatinine, Ser 0.83; Hemoglobin 13.9; Platelets 174.0; Potassium 4.9; Sodium 132   Personally reviewed    Wt Readings from Last 3 Encounters:  08/24/19 160 lb (72.6 kg)  08/10/19 160 lb (72.6 kg)  07/13/19 162 lb (73.5 kg)     Other studies personally reviewed: Additional studies/ records that were reviewed today include: As above   Last device remote is reviewed from [facility] PDF dated 4/21* which reveals normal device function,   arrhythmias - none  False positive afib 2/2 atrial oversensing    ASSESSMENT & PLAN:   Syncope  Sinus node dysfunction  Pacemaker St Jude The patient's device was interrogated.  The information was reviewed. No  changes were made in the programming.     Doing exceptionally well  No syncope   COVID 19 screen The patient denies symptoms of COVID 19 at this time.  The importance of social distancing was discussed today.  Follow-up: 64m  Current medicines are reviewed at length with the patient today.   The patient does not have concerns regarding his medicines.  The following changes were made today:  none  Labs/ tests ordered today include:   No orders of the defined types were placed in this encounter.   Future tests ( post COVID )     Patient Risk:  after full review of this patients clinical status, I feel that they are at moderate risk at this time.  Today, I have spent 7  minutes with the patient with telehealth technology discussing the above.  Signed, Virl Axe, MD  08/24/2019 3:34 PM     [facility] [address] [place]  [address] [phone] (office) [phone] (fax)

## 2019-08-24 NOTE — Telephone Encounter (Signed)
  Patient Consent for Virtual Visit         [name] [name] has provided verbal consent on 08/24/2019 for a virtual visit (video or telephone).   CONSENT FOR VIRTUAL VISIT FOR:  [name] [name]  By participating in this virtual visit I agree to the following:  I hereby voluntarily request, consent and authorize [facility] and its employed or contracted physicians, physician assistants, nurse practitioners or other licensed health care professionals (the Practitioner), to provide me with telemedicine health care services (the "Services") as deemed necessary by the treating Practitioner. I acknowledge and consent to receive the Services by the Practitioner via telemedicine. I understand that the telemedicine visit will involve communicating with the Practitioner through live audiovisual communication technology and the disclosure of certain medical information by electronic transmission. I acknowledge that I have been given the opportunity to request an in-person assessment or other available alternative prior to the telemedicine visit and am voluntarily participating in the telemedicine visit.  I understand that I have the right to withhold or withdraw my consent to the use of telemedicine in the course of my care at any time, without affecting my right to future care or treatment, and that the Practitioner or I may terminate the telemedicine visit at any time. I understand that I have the right to inspect all information obtained and/or recorded in the course of the telemedicine visit and may receive copies of available information for a reasonable fee.  I understand that some of the potential risks of receiving the Services via telemedicine include:  Marland Kitchen Delay or interruption in medical evaluation due to technological equipment failure or disruption; . Information transmitted may not be sufficient (e.g. poor resolution of images) to allow for appropriate medical decision making by the Practitioner;  and/or  . In rare instances, security protocols could fail, causing a breach of personal health information.  Furthermore, I acknowledge that it is my responsibility to provide information about my medical history, conditions and care that is complete and accurate to the best of my ability. I acknowledge that Practitioner's advice, recommendations, and/or decision may be based on factors not within their control, such as incomplete or inaccurate data provided by me or distortions of diagnostic images or specimens that may result from electronic transmissions. I understand that the practice of medicine is not an exact science and that Practitioner makes no warranties or guarantees regarding treatment outcomes. I acknowledge that a copy of this consent can be made available to me via my patient portal ([facility]), or I can request a printed copy by calling the office of [facility].    I understand that my insurance will be billed for this visit.   I have read or had this consent read to me. . I understand the contents of this consent, which adequately explains the benefits and risks of the Services being provided via telemedicine.  . I have been provided ample opportunity to ask questions regarding this consent and the Services and have had my questions answered to my satisfaction. . I give my informed consent for the services to be provided through the use of telemedicine in my medical care

## 2019-08-30 DIAGNOSIS — B353 Tinea pedis: Secondary | ICD-10-CM | POA: Diagnosis not present

## 2019-08-30 DIAGNOSIS — Z85828 Personal history of other malignant neoplasm of skin: Secondary | ICD-10-CM | POA: Diagnosis not present

## 2019-08-30 DIAGNOSIS — L570 Actinic keratosis: Secondary | ICD-10-CM | POA: Diagnosis not present

## 2019-08-30 DIAGNOSIS — D692 Other nonthrombocytopenic purpura: Secondary | ICD-10-CM | POA: Diagnosis not present

## 2019-09-01 ENCOUNTER — Ambulatory Visit (INDEPENDENT_AMBULATORY_CARE_PROVIDER_SITE_OTHER): Payer: Medicare HMO | Admitting: Adult Health

## 2019-09-01 ENCOUNTER — Other Ambulatory Visit: Payer: Self-pay

## 2019-09-01 ENCOUNTER — Encounter: Payer: Self-pay | Admitting: Adult Health

## 2019-09-01 VITALS — BP 142/78 | Temp 98.2°F | Wt 159.0 lb

## 2019-09-01 DIAGNOSIS — H6123 Impacted cerumen, bilateral: Secondary | ICD-10-CM

## 2019-09-01 NOTE — Progress Notes (Signed)
Subjective:    Patient ID: [name] [name], male    DOB: [birth year], 79 y.o.   MRN: [id number]  HPI 79 year old male who  has a past medical history of GERD (gastroesophageal reflux disease), HYPERLIPIDEMIA (07/17/2008), HYPERTENSION (07/17/2008), Sinus node dysfunction ([place]) (01/27/2011), and Sleep apnea.  He presents to the office today for the feeling of ear fullness and decreased hearing. He denies any other symptoms   Review of Systems See HPI   Past Medical History:  Diagnosis Date  . GERD (gastroesophageal reflux disease)   . HYPERLIPIDEMIA 07/17/2008  . HYPERTENSION 07/17/2008  . Sinus node dysfunction ([place]) 01/27/2011   a. s/p STJ dual chamber PPM   . Sleep apnea    doesnt wear cpap    Social History   Socioeconomic History  . Marital status: Married    Spouse name: Not on file  . Number of children: 1  . Years of education: Not on file  . Highest education level: Not on file  Occupational History  . Occupation: CO-OWNER    Employer: [employer]  Tobacco Use  . Smoking status: Former Smoker    Packs/day: 1.00    Years: 20.00    Pack years: 20.00    Types: Cigarettes    Quit date: 08/27/1982    Years since quitting: 37.0  . Smokeless tobacco: Never Used  Substance and Sexual Activity  . Alcohol use: Yes    Alcohol/week: 14.0 standard drinks    Types: 14 Standard drinks or equivalent per week    Comment: beer x 2 per day with meals/ may slow down   . Drug use: No  . Sexual activity: Not on file  Other Topics Concern  . Not on file  Social History Narrative   Married   [place] 2   2 children    3-4 c caffeine    Social Determinants of Health   Financial Resource Strain: Low Risk   . Difficulty of Paying Living Expenses: Not hard at all  Food Insecurity: No Food Insecurity  . Worried About Charity fundraiser in the Last Year: Never true  . Ran Out of Food in the Last Year: Never true  Transportation Needs: No Transportation Needs  . Lack of  Transportation (Medical): No  . Lack of Transportation (Non-Medical): No  Physical Activity: Sufficiently Active  . Days of Exercise per Week: 7 days  . Minutes of Exercise per Session: 90 min  Stress: No Stress Concern Present  . Feeling of Stress : Only a little  Social Connections: Unknown  . Frequency of Communication with Friends and Family: More than three times a week  . Frequency of Social Gatherings with Friends and Family: Once a week  . Attends Religious Services: Not on file  . Active Member of Clubs or Organizations: Not on file  . Attends Archivist Meetings: Not on file  . Marital Status: Married  Human resources officer Violence:   . Fear of Current or Ex-Partner:   . Emotionally Abused:   Marland Kitchen Physically Abused:   . Sexually Abused:     Past Surgical History:  Procedure Laterality Date  . COLONOSCOPY    . PACEMAKER PLACEMENT     STJ dual chamber PPM implant 2011  . POLYPECTOMY      Family History  Problem Relation Age of Onset  . Hypertension Mother   . Stroke Mother   . Colitis Father   . Hypertension Father   . Other Father  esophageal stricture  . Alcohol abuse Maternal Aunt   . Colon cancer Neg Hx   . Rectal cancer Neg Hx   . Stomach cancer Neg Hx   . Esophageal cancer Neg Hx     No Known Allergies  Current Outpatient Medications on File Prior to Visit  Medication Sig Dispense Refill  . benazepril (LOTENSIN) 40 MG tablet TAKE 1 TABLET BY MOUTH EVERY DAY 90 tablet 2  . cholecalciferol (VITAMIN D3) 25 MCG (1000 UNIT) tablet Take 1,000 Units by mouth daily.    . ciclopirox (PENLAC) 8 % solution Apply topically at bedtime. Apply over nail and surrounding skin. (Patient not taking: Reported on 08/24/2019) 6.6 mL 5  . melatonin 5 MG TABS Take 5 mg by mouth.    Marland Kitchen omeprazole (PRILOSEC) 20 MG capsule Take 20 mg by mouth daily.    . rosuvastatin (CRESTOR) 5 MG tablet TAKE 0.5 TABLETS (2.5 MG TOTAL) BY MOUTH AT BEDTIME. 45 tablet 3  . Turmeric (QC  TUMERIC COMPLEX PO) Take by mouth.     No current facility-administered medications on file prior to visit.    There were no vitals taken for this visit.      Objective:   Physical Exam Vitals and nursing note reviewed.  Constitutional:      Appearance: Normal appearance.  HENT:     Right Ear: Tympanic membrane, ear canal and external ear normal. There is impacted cerumen.     Left Ear: Tympanic membrane, ear canal and external ear normal. There is impacted cerumen.  Skin:    Capillary Refill: Capillary refill takes less than 2 seconds.  Neurological:     General: No focal deficit present.     Mental Status: He is alert and oriented to person, place, and time.  Psychiatric:        Mood and Affect: Mood normal.        Behavior: Behavior normal.        Thought Content: Thought content normal.        Judgment: Judgment normal.           Assessment & Plan:  1. Bilateral impacted cerumen Warm water was applied and gentle ear lavage performed bilaterally.There was no complications and following the disimpaction the tympanic membrane were visible.  Tympanic membrane was intact following the procedure.  Auditory canals are normal.  The patient reported relief of symptoms after removal of cerumen. Patient tolerated procedure well.   Dorothyann Peng, NP

## 2019-09-12 DIAGNOSIS — R109 Unspecified abdominal pain: Secondary | ICD-10-CM | POA: Diagnosis not present

## 2019-09-12 DIAGNOSIS — R4182 Altered mental status, unspecified: Secondary | ICD-10-CM | POA: Diagnosis not present

## 2019-09-12 DIAGNOSIS — M25552 Pain in left hip: Secondary | ICD-10-CM | POA: Diagnosis not present

## 2019-09-12 DIAGNOSIS — R4781 Slurred speech: Secondary | ICD-10-CM | POA: Diagnosis not present

## 2019-09-12 DIAGNOSIS — R0600 Dyspnea, unspecified: Secondary | ICD-10-CM | POA: Diagnosis not present

## 2019-09-12 DIAGNOSIS — R531 Weakness: Secondary | ICD-10-CM | POA: Diagnosis not present

## 2019-09-12 DIAGNOSIS — G459 Transient cerebral ischemic attack, unspecified: Secondary | ICD-10-CM | POA: Diagnosis not present

## 2019-09-12 DIAGNOSIS — N2889 Other specified disorders of kidney and ureter: Secondary | ICD-10-CM | POA: Diagnosis not present

## 2019-09-12 DIAGNOSIS — G9340 Encephalopathy, unspecified: Secondary | ICD-10-CM | POA: Diagnosis not present

## 2019-09-12 DIAGNOSIS — I2510 Atherosclerotic heart disease of native coronary artery without angina pectoris: Secondary | ICD-10-CM | POA: Diagnosis not present

## 2019-09-12 DIAGNOSIS — I6523 Occlusion and stenosis of bilateral carotid arteries: Secondary | ICD-10-CM | POA: Diagnosis not present

## 2019-09-12 DIAGNOSIS — Z743 Need for continuous supervision: Secondary | ICD-10-CM | POA: Diagnosis not present

## 2019-09-12 DIAGNOSIS — K7689 Other specified diseases of liver: Secondary | ICD-10-CM | POA: Diagnosis not present

## 2019-09-12 DIAGNOSIS — I495 Sick sinus syndrome: Secondary | ICD-10-CM | POA: Diagnosis not present

## 2019-09-12 DIAGNOSIS — N400 Enlarged prostate without lower urinary tract symptoms: Secondary | ICD-10-CM | POA: Diagnosis not present

## 2019-09-12 DIAGNOSIS — R410 Disorientation, unspecified: Secondary | ICD-10-CM | POA: Diagnosis not present

## 2019-09-12 DIAGNOSIS — R55 Syncope and collapse: Secondary | ICD-10-CM | POA: Diagnosis not present

## 2019-09-12 DIAGNOSIS — Z1159 Encounter for screening for other viral diseases: Secondary | ICD-10-CM | POA: Diagnosis not present

## 2019-09-12 DIAGNOSIS — R05 Cough: Secondary | ICD-10-CM | POA: Diagnosis not present

## 2019-09-12 DIAGNOSIS — R9431 Abnormal electrocardiogram [ECG] [EKG]: Secondary | ICD-10-CM | POA: Diagnosis not present

## 2019-09-12 DIAGNOSIS — N281 Cyst of kidney, acquired: Secondary | ICD-10-CM | POA: Diagnosis not present

## 2019-09-12 DIAGNOSIS — E785 Hyperlipidemia, unspecified: Secondary | ICD-10-CM | POA: Diagnosis not present

## 2019-09-12 DIAGNOSIS — Z01810 Encounter for preprocedural cardiovascular examination: Secondary | ICD-10-CM | POA: Diagnosis not present

## 2019-09-12 DIAGNOSIS — I700 Atherosclerosis of aorta: Secondary | ICD-10-CM | POA: Diagnosis not present

## 2019-09-12 DIAGNOSIS — K769 Liver disease, unspecified: Secondary | ICD-10-CM | POA: Diagnosis not present

## 2019-09-12 DIAGNOSIS — R634 Abnormal weight loss: Secondary | ICD-10-CM | POA: Diagnosis not present

## 2019-09-12 DIAGNOSIS — I10 Essential (primary) hypertension: Secondary | ICD-10-CM | POA: Diagnosis not present

## 2019-09-12 DIAGNOSIS — R9082 White matter disease, unspecified: Secondary | ICD-10-CM | POA: Diagnosis not present

## 2019-09-12 DIAGNOSIS — Z20822 Contact with and (suspected) exposure to covid-19: Secondary | ICD-10-CM | POA: Diagnosis not present

## 2019-09-12 LAB — GLUCOSE, CAPILLARY

## 2019-09-13 DIAGNOSIS — E785 Hyperlipidemia, unspecified: Secondary | ICD-10-CM | POA: Diagnosis not present

## 2019-09-13 DIAGNOSIS — N2889 Other specified disorders of kidney and ureter: Secondary | ICD-10-CM | POA: Diagnosis not present

## 2019-09-13 DIAGNOSIS — G459 Transient cerebral ischemic attack, unspecified: Secondary | ICD-10-CM | POA: Diagnosis not present

## 2019-09-13 DIAGNOSIS — R4182 Altered mental status, unspecified: Secondary | ICD-10-CM | POA: Diagnosis not present

## 2019-09-13 DIAGNOSIS — I10 Essential (primary) hypertension: Secondary | ICD-10-CM | POA: Diagnosis not present

## 2019-09-13 DIAGNOSIS — K769 Liver disease, unspecified: Secondary | ICD-10-CM | POA: Diagnosis not present

## 2019-09-15 ENCOUNTER — Encounter: Payer: Self-pay | Admitting: Family Medicine

## 2019-09-18 NOTE — Progress Notes (Signed)
[facility] Phone: [phone] Subjective:   IKandace Blitz , am serving as a scribe for Dr. Hulan Saas. This visit occurred during the SARS-CoV-2 public health emergency.  Safety protocols were in place, including screening questions prior to the visit, additional usage of staff PPE, and extensive cleaning of exam room while observing appropriate contact time as indicated for disinfecting solutions.   I'm seeing this patient by the request  of:  [name], MD  CC: Shoulder pain follow-up  [id number]  NEWLAND SANTELL is a 79 y.o. male coming in with complaint of shoulder pain.  Patient has been seen previously and does have a rotator cuff arthropathy.  Patient has failed most all conservative therapy.  Wanting to avoid surgery if possible. Here for PRP injection.     Past Medical History:  Diagnosis Date  . GERD (gastroesophageal reflux disease)   . HYPERLIPIDEMIA 07/17/2008  . HYPERTENSION 07/17/2008  . Sinus node dysfunction ([place]) 01/27/2011   a. s/p STJ dual chamber PPM   . Sleep apnea    doesnt wear cpap   Past Surgical History:  Procedure Laterality Date  . COLONOSCOPY    . PACEMAKER PLACEMENT     STJ dual chamber PPM implant 2011  . POLYPECTOMY     Social History   Socioeconomic History  . Marital status: Married    Spouse name: Not on file  . Number of children: 1  . Years of education: Not on file  . Highest education level: Not on file  Occupational History  . Occupation: CO-OWNER    Employer: [employer]  Tobacco Use  . Smoking status: Former Smoker    Packs/day: 1.00    Years: 20.00    Pack years: 20.00    Types: Cigarettes    Quit date: 08/27/1982    Years since quitting: 37.0  . Smokeless tobacco: Never Used  Substance and Sexual Activity  . Alcohol use: Yes    Alcohol/week: 14.0 standard drinks    Types: 14 Standard drinks or equivalent per week    Comment:  beer x 2 per day with meals/ may slow down   . Drug use: No  . Sexual activity: Not on file  Other Topics Concern  . Not on file  Social History Narrative   Married   [place] 2   2 children    3-4 c caffeine    Social Determinants of Health   Financial Resource Strain: Low Risk   . Difficulty of Paying Living Expenses: Not hard at all  Food Insecurity: No Food Insecurity  . Worried About Charity fundraiser in the Last Year: Never true  . Ran Out of Food in the Last Year: Never true  Transportation Needs: No Transportation Needs  . Lack of Transportation (Medical): No  . Lack of Transportation (Non-Medical): No  Physical Activity: Sufficiently Active  . Days of Exercise per Week: 7 days  . Minutes of Exercise per Session: 90 min  Stress: No Stress Concern Present  . Feeling of Stress : Only a little  Social Connections: Unknown  . Frequency of Communication with Friends and Family: More than three times a week  . Frequency of Social Gatherings with Friends and Family: Once a week  . Attends Religious Services: Not on file  . Active Member of Clubs or Organizations: Not on file  . Attends Archivist Meetings: Not on file  . Marital  Status: Married   No Known Allergies Family History  Problem Relation Age of Onset  . Hypertension Mother   . Stroke Mother   . Colitis Father   . Hypertension Father   . Other Father        esophageal stricture  . Alcohol abuse Maternal Aunt   . Colon cancer Neg Hx   . Rectal cancer Neg Hx   . Stomach cancer Neg Hx   . Esophageal cancer Neg Hx      Current Outpatient Medications (Cardiovascular):  .  benazepril (LOTENSIN) 40 MG tablet, TAKE 1 TABLET BY MOUTH EVERY DAY .  rosuvastatin (CRESTOR) 5 MG tablet, TAKE 0.5 TABLETS (2.5 MG TOTAL) BY MOUTH AT BEDTIME.     Current Outpatient Medications (Other):  .  cholecalciferol (VITAMIN D3) 25 MCG (1000 UNIT) tablet, Take 1,000 Units by mouth daily. .  melatonin 5 MG TABS, Take 5  mg by mouth. Marland Kitchen  omeprazole (PRILOSEC) 20 MG capsule, Take 20 mg by mouth daily. .  Turmeric (QC TUMERIC COMPLEX PO), Take by mouth.   Reviewed prior external information including notes and imaging from  primary care provider As well as notes that were available from care everywhere and other healthcare systems.  Past medical history, social, surgical and family history all reviewed in electronic medical record.  No pertanent information unless stated regarding to the chief complaint.   Review of Systems:  No headache, visual changes, nausea, vomiting, diarrhea, constipation, dizziness, abdominal pain, skin rash, fevers, chills, night sweats, weight loss, swollen lymph nodes, body aches, joint swelling, chest pain, shortness of breath, mood changes. POSITIVE muscle aches  Objective  Height 5\' 7"  (1.702 m).   General: No apparent distress alert and oriented x3 mood and affect normal, dressed appropriately.  HEENT: Pupils equal, extraocular movements intact  Respiratory: Patient's speak in full sentences and does not appear short of breath  Cardiovascular: No lower extremity edema, non tender, no erythema  Neuro: Cranial nerves II through XII are intact, neurovascularly intact in all extremities with 2+ DTRs and 2+ pulses.  Gait normal with good balance and coordination.  MSK: Shoulder exam shows the patient does have some atrophy noted.  Crepitus noted.  Decreased range of motion in all planes in 4-5 strength of the rotator cuff noted  Procedure: Real-time Ultrasound Guided Injection of right glenohumeral joint Device: GE Logiq Q7  Ultrasound guided injection is preferred based studies that show increased duration, increased effect, greater accuracy, decreased procedural pain, increased response rate with ultrasound guided versus blind injection.  Verbal informed consent obtained.  Time-out conducted.  Noted no overlying erythema, induration, or other signs of local infection.  Skin  prepped in a sterile fashion.  Local anesthesia: Topical Ethyl chloride.  With sterile technique and under real time ultrasound guidance:  Joint visualized.  23g 1  inch needle inserted posterior approach. Pictures taken for needle placement. Patient did have injection of 2 cc 0.5 cc of 0.5% Marcaine and then 6 cc of leukocyte poor PRP Completed without difficulty  Pain immediately resolved suggesting accurate placement of the medication.  Advised to call if fevers/chills, erythema, induration, drainage, or persistent bleeding.  Images permanently stored and available for review in the ultrasound unit.  Impression: Technically successful ultrasound guided injection.    Impression and Recommendations:     The above documentation has been reviewed and is accurate and complete Lyndal Pulley, DO       Note: This dictation was prepared with Dragon dictation  along with smaller phrase technology. Any transcriptional errors that result from this process are unintentional.

## 2019-09-19 ENCOUNTER — Ambulatory Visit: Payer: Self-pay | Admitting: Family Medicine

## 2019-09-19 ENCOUNTER — Encounter: Payer: Self-pay | Admitting: Family Medicine

## 2019-09-19 ENCOUNTER — Ambulatory Visit (INDEPENDENT_AMBULATORY_CARE_PROVIDER_SITE_OTHER): Payer: Medicare HMO

## 2019-09-19 ENCOUNTER — Other Ambulatory Visit: Payer: Self-pay

## 2019-09-19 VITALS — BP 140/70 | HR 79 | Ht 67.0 in | Wt 164.0 lb

## 2019-09-19 DIAGNOSIS — M75101 Unspecified rotator cuff tear or rupture of right shoulder, not specified as traumatic: Secondary | ICD-10-CM

## 2019-09-19 DIAGNOSIS — G8929 Other chronic pain: Secondary | ICD-10-CM

## 2019-09-19 DIAGNOSIS — M25511 Pain in right shoulder: Secondary | ICD-10-CM

## 2019-09-19 DIAGNOSIS — M12811 Other specific arthropathies, not elsewhere classified, right shoulder: Secondary | ICD-10-CM

## 2019-09-19 NOTE — Patient Instructions (Addendum)
Good to see you See me again in 4 weeks

## 2019-09-19 NOTE — Assessment & Plan Note (Signed)
Rotator cuff tendinopathy and arthropathy noted.  Patient tried PRP today.  Discussed which activities to do.  Return to activity progression given.  Discussed avoiding icing.  Patient will follow up again in  4 weeks.

## 2019-09-20 ENCOUNTER — Encounter: Payer: Self-pay | Admitting: Family Medicine

## 2019-09-20 ENCOUNTER — Other Ambulatory Visit: Payer: Self-pay

## 2019-09-20 ENCOUNTER — Ambulatory Visit (INDEPENDENT_AMBULATORY_CARE_PROVIDER_SITE_OTHER): Payer: Medicare HMO | Admitting: Family Medicine

## 2019-09-20 VITALS — BP 124/62 | HR 66 | Temp 97.6°F | Wt 163.4 lb

## 2019-09-20 DIAGNOSIS — I495 Sick sinus syndrome: Secondary | ICD-10-CM | POA: Diagnosis not present

## 2019-09-20 DIAGNOSIS — E785 Hyperlipidemia, unspecified: Secondary | ICD-10-CM | POA: Diagnosis not present

## 2019-09-20 DIAGNOSIS — R4182 Altered mental status, unspecified: Secondary | ICD-10-CM | POA: Diagnosis not present

## 2019-09-20 MED ORDER — ROSUVASTATIN CALCIUM 5 MG PO TABS: 5.0000 mg | ORAL_TABLET | Freq: Every day | ORAL | 3 refills | Status: DC

## 2019-09-20 NOTE — Progress Notes (Addendum)
Subjective:     Patient ID: [name] [name], male   DOB: 1941-02-02, 79 y.o.   MRN: [id number]  HPI   Dorsie is seen for hospital follow-up.  He was down actually in [place] last week visiting family.  He got up 1 morning around 5 AM.  He recalls having slightly loose stool and went back to bed and then woke up around 6 AM and went to get some coffee.  He was sitting in the sun room and apparently had already finished about half a cup when he was found in altered mental state.  His family found him slumped over in a chair and he was "clammy.  He was arousable but very confused and apparently remained so for at least 30 minutes to 1 hour.  EMS was called.  His blood sugar was normal.  He was taken to the hospital for further evaluation.  We do not have full records.  We only have some labs that were done and they appear to have been generally unremarkable.  He reportedly had "brain scan" which was presumably CT scan.  He has pacemaker and apparently cannot have MRI.  CT of head apparently showed some atrophy changes but we have no records at this time.  Patient reports that he had carotid Dopplers and was told this was normal.  He was seen by neurologist there.  It was felt that he probably had TIA.  He has no history of seizure.  There was no clear observed seizure activity.  He had multiple labs including chemistries, CBC, troponin, drug screen and these were all unremarkable.  He was started back on baby aspirin 81 mg daily per neurology and also Crestor increased to 5 mg daily.  There was mention on his discharge diagnoses of "renal mass "and "liver lesion ".  He states that he has had lesions on both the kidney and liver that have been followed previously and stable.  We again have no records at this time.  He does have hx of sick sinus syndrome and has pacemaker .  No recent palpitations.  Past Medical History:  Diagnosis Date  . GERD (gastroesophageal reflux disease)   .  HYPERLIPIDEMIA 07/17/2008  . HYPERTENSION 07/17/2008  . Sinus node dysfunction ([place]) 01/27/2011   a. s/p STJ dual chamber PPM   . Sleep apnea    doesnt wear cpap   Past Surgical History:  Procedure Laterality Date  . COLONOSCOPY    . PACEMAKER PLACEMENT     STJ dual chamber PPM implant 2011  . POLYPECTOMY      reports that he quit smoking about 37 years ago. His smoking use included cigarettes. He has a 20.00 pack-year smoking history. He has never used smokeless tobacco. He reports current alcohol use of about 14.0 standard drinks of alcohol per week. He reports that he does not use drugs. family history includes Alcohol abuse in his maternal aunt; Colitis in his father; Hypertension in his father and mother; Other in his father; Stroke in his mother. No Known Allergies   Review of Systems  Constitutional: Negative for appetite change, chills, fatigue, fever and unexpected weight change.  Eyes: Negative for visual disturbance.  Respiratory: Negative for cough, chest tightness and shortness of breath.   Cardiovascular: Negative for chest pain, palpitations and leg swelling.  Gastrointestinal: Negative for abdominal pain.  Endocrine: Negative for polydipsia and polyuria.  Genitourinary: Negative for dysuria.  Musculoskeletal: Negative for back pain.  Neurological: Positive for syncope.  Negative for dizziness, weakness, light-headedness and headaches.       See HPI.  No dizziness since event described.  Psychiatric/Behavioral: Negative for dysphoric mood.       Objective:   Physical Exam Constitutional:      Appearance: He is well-developed.  HENT:     Right Ear: External ear normal.     Left Ear: External ear normal.  Eyes:     Pupils: Pupils are equal, round, and reactive to light.  Neck:     Thyroid: No thyromegaly.  Cardiovascular:     Rate and Rhythm: Normal rate and regular rhythm.  Pulmonary:     Effort: Pulmonary effort is normal. No respiratory distress.      Breath sounds: Normal breath sounds. No wheezing or rales.  Musculoskeletal:     Cervical back: Neck supple.  Neurological:     General: No focal deficit present.     Mental Status: He is alert and oriented to person, place, and time.     Cranial Nerves: No cranial nerve deficit.     Sensory: No sensory deficit.     Motor: No weakness.     Coordination: Coordination normal.     Gait: Gait normal.  Psychiatric:        Mood and Affect: Mood normal.        Thought Content: Thought content normal.        Assessment:     Recent episode of altered mental status as above.  He reportedly did not have any focal neurologic findings and concern was whether this was TIA.  This did not sound likely cardiac in origin given his altered mental status.   Seizure could also be in differential.   He feels back to baseline at this time.    Plan:     -Set up neurology referral to further assess  -We have sent request for records from his hospitalization for discharge summary  -Continue aspirin 81 mg daily for now and increased dose of Crestor  -We recommend try to avoid driving as much as possible until further evaluated by neurology.  -Follow-up immediately for any recurrent confusion or other concerns  -We will need repeat lipid panel in about 8 weeks after recent change of Crestor dosage  [name] MD [facility]  Addendum 09/25/2019.  Received hospital discharge notes from [place] CT CAT scan showed 1.4 cm indeterminate lesion right lower renal pole and an indeterminate subcapsular 1.7 cm enhancing liver lesion right lobe of liver.  Patient thinks that both of these lesions have been detected previously.  He just saw urologist in April and had ultrasound.  We have asked that he get those records of most recent ultrasound results for review.  He is still waiting to hear back from neurology office regarding follow-up.  He has had no further episodes.   In fact, he states he played couple hours of pickleball earlier today and felt well.  [name] MD  Primary Care at [facility]

## 2019-09-20 NOTE — Patient Instructions (Addendum)
Confusion °Confusion is the inability to think with the usual speed or clarity. People who are confused often describe their thinking as cloudy or unclear. Confusion can also include feeling disoriented. This means you are unaware of where you are or who you are. You may also not know the date or time. When confused, you may have difficulty remembering, paying attention, or making decisions. Some people also act aggressively when they are confused. °In some cases, confusion may come on quickly. In other cases, it may develop slowly over time. How quickly confusion comes on depends on the cause. °Confusion may be caused by: °· Head injury (concussion). °· Seizures. °· Stroke. °· Fever. °· Brain tumor. °· Decrease in brain function due to a vascular or neurologic condition (dementia). °· Emotions, like rage or terror. °· Inability to know what is real and what is not (hallucinations). °· Infections, such as a urinary tract infection (UTI). °· Using too much alcohol, drugs, or medicines. °· Loss of fluid (dehydration) or an imbalance of salts in the body (electrolytes). °· Lack of sleep. °· Low blood sugar (diabetes). °· Low levels of oxygen. This comes from conditions such as chronic lung disorders. °· Side effects of medicines, or taking medicines that affect other medicines (drug interactions). °· Lack of certain nutrients, especially niacin, thiamine, vitamin C, or vitamin B. °· Sudden drop in body temperature (hypothermia). °· Change in routine, such as traveling or being hospitalized. °Follow these instructions at home: °Pay attention to your symptoms. Tell your health care provider about any changes or if you develop new symptoms. Follow these instructions to control or treat symptoms. Ask a family member or friend for help if needed. °Medicines °· Take over-the-counter and prescription medicines only as told by your health care provider. °· Ask your health care provider about changing or stopping any medicines  that may be causing your confusion. °· Avoid pain medicines or sleep medicines until you have fully recovered. °· Use a pillbox or an alarm to help you take the right medicines at the right time. °Lifestyle ° °· Eat a balanced diet that includes fruits and vegetables. °· Get enough sleep. For most adults, this is 7-9 hours each night. °· Do not drink alcohol. °· Do not become isolated. Spend time with other people and make plans for your days. °· Do not drive until your health care provider says that it is safe to do so. °· Do not use any products that contain nicotine or tobacco, such as cigarettes and e-cigarettes. If you need help quitting, ask your health care provider. °· Stop other activities that may increase your chances of getting hurt. These may include some work duties, sports activities, swimming, or bike riding. Ask your health care provider what activities are safe for you. °What caregivers can do °· Find out if the person is confused. Ask the person to state his or her name, age, and the date. If the person is unsure or answers incorrectly, he or she may be confused. °· Always introduce yourself, no matter how well the person knows you. °· Remind the person of his or her location. Do this often. °· Place a calendar and clock near the person who is confused. °· Talk about current events and plans for the day. °· Keep the environment calm, quiet, and peaceful. °· Help the person do the things that he or she is unable to do. These include: °? Taking medicines. °? Keeping follow-up visits with his or her health care   provider. ? Helping with household duties, including meal preparation. ? Running errands.  Get help if you need it. There are several support groups for caregivers.  If the person you are helping needs more support, consider day care, extended care programs, or a skilled nursing facility. The person's health care provider may be able to help evaluate these options. General  instructions  Monitor yourself for any conditions you may have. These may include: ? Checking your blood glucose levels, if you have diabetes. ? Watching your weight, if you are overweight. ? Monitoring your blood pressure, if you have hypertension. ? Monitoring your body temperature, if you have a fever.  Keep all follow-up visits as told by your health care provider. This is important. Contact a health care provider if:  Your symptoms get worse. Get help right away if you:  Feel that you are not able to care for yourself.  Develop severe headaches, repeated vomiting, seizures, blackouts, or slurred speech.  Have increasing confusion, weakness, numbness, restlessness, or personality changes.  Develop a loss of balance, have marked dizziness, feel uncoordinated, or fall.  Develop severe anxiety, or you have delusions or hallucinations. These symptoms may represent a serious problem that is an emergency. Do not wait to see if the symptoms will go away. Get medical help right away. Call your local emergency services (911 in the [place] Do not drive yourself to the hospital. Summary  Confusion is the inability to think with the usual speed or clarity. People who are confused often describe their thinking as cloudy or unclear.  Confusion can also include having difficulty remembering, paying attention, or making decisions.  Confusion may come on quickly or develop slowly over time, depending on the cause. There are many different causes of confusion.  Ask for help from family members or friends if you are unable to take care of yourself. This information is not intended to replace advice given to you by your health care provider. Make sure you discuss any questions you have with your health care provider. Document Revised: 04/08/2017 Document Reviewed: 04/08/2017 Elsevier Patient Education  2020 [place] with aspirin one daily Minimize driving until further assessed  by neurology We are getting old records and I am setting up neurology referral.

## 2019-09-21 ENCOUNTER — Telehealth: Payer: Self-pay | Admitting: Family Medicine

## 2019-09-21 NOTE — Telephone Encounter (Signed)
Pt would like to know if we received his medical records from the hospital in [place]. IF not, pt states that he did receive his copy and he can drop it off if needed.     Pt would like a call back at  [phone]

## 2019-09-22 NOTE — Telephone Encounter (Signed)
Left detailed message letting pt know we did receive records

## 2019-09-26 ENCOUNTER — Telehealth: Payer: Self-pay | Admitting: Family Medicine

## 2019-09-26 NOTE — Telephone Encounter (Signed)
Pt called in stating that the [facility] information that you all were speaking about was on 08/07/2019 and they sent it over on 08/09/2019 it is in the pt's chart.

## 2019-09-26 NOTE — Telephone Encounter (Signed)
Pt told note below and has understanding nothing further needed

## 2019-09-26 NOTE — Telephone Encounter (Signed)
Lvm for pt to call back. 

## 2019-09-26 NOTE — Telephone Encounter (Signed)
Found it.  No further w/u of renal mass at this time- but keep follow up with Urology as scheduled.

## 2019-10-03 ENCOUNTER — Encounter: Payer: Self-pay | Admitting: Neurology

## 2019-10-03 ENCOUNTER — Ambulatory Visit: Payer: Medicare HMO | Admitting: Neurology

## 2019-10-03 VITALS — BP 155/68 | HR 60 | Ht 67.0 in | Wt 163.0 lb

## 2019-10-03 DIAGNOSIS — G459 Transient cerebral ischemic attack, unspecified: Secondary | ICD-10-CM

## 2019-10-03 DIAGNOSIS — R55 Syncope and collapse: Secondary | ICD-10-CM

## 2019-10-03 MED ORDER — FISH OIL 1000 MG PO CPDR: 1.0000 | DELAYED_RELEASE_CAPSULE | Freq: Every morning | ORAL | 1 refills | Status: DC

## 2019-10-03 NOTE — Progress Notes (Signed)
[facility] [address] [place]. [place] [address] [phone]       OFFICE CONSULT NOTE  [name] Date of Birth:  [birth year] Medical Record Number:  [id number]   Referring MD: Carolann Littler  Reason for Referral: Loss of consciousness episode  HPI: Mr. Buchberger is a pleasant 79 year old Caucasian male seen today for initial office consultation visit.  Is accompanied by his wife.  History is obtained from him and review of referral notes and imaging results though actual films were not available at the time of this visit.  His past medical history of hypertension, hyperlipidemia, sinus node dysfunction status post pacemaker insertion who was visiting [place] when on 09/12/2019 he had a transient episode of confusion with brief loss of consciousness.  He states that he woke up on that day with an episode of diarrhea and went to the bathroom and then subsequently made himself a cup of coffee.  Last thing he remembers is sitting in a chair and drinking coffee.  Reportedly his sister got up and found him asleep on the chair and in awkward position.  She tried to arouse him but he did not wake up and sounded confused and his speech was garbled and did not make sense.  EMS were called who found him to be arousable but he did not have any focal deficits to raise concern for stroke.  He had one episode of vomiting.  By the time he reached the hospital he was slightly disoriented and confused but had no focal deficits.  His lab work was pretty unremarkable urine drug screen was negative.  Alcohol test was negative.  CT scan of the head showed generalized cortical volume loss with changes of small vessel disease and slight enlargement of the ventricles disproportionate to the degree of atrophy as per radiology report.  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  Carotid ultrasound showed no significant extracranial stenosis.  EEG was not done.  Patient states he still does not remember all the details of the episode.  He has a pacemaker since 2010 which is MRI incompatible hence it was not done.  Patient states is felt fine since his come here has had no further episodes of loss of consciousness.  He denies any prior history of seizures, significant head injury with loss of consciousness strokes or TIAs.  He does have a long-term mild memory difficulties which are mostly short-term.  These are not progressive as per him and his wife.  He does have a family history of dementia however.  He denies symptoms suggestive of normal pressure hydrocephalus in the form of progressive gait and balance difficulties, bladder incontinence or progressive memory loss. ROS:   14 system review of systems is positive for memory difficulties, loss of consciousness, confusion, disorientation and all other systems negative PMH:  Past Medical History:  Diagnosis Date  . GERD (gastroesophageal reflux disease)   . HYPERLIPIDEMIA 07/17/2008  . HYPERTENSION 07/17/2008  . Sinus node dysfunction ([place]) 01/27/2011   a. s/p STJ dual chamber PPM   . Sleep apnea    doesnt wear cpap    Social History:  Social History   Socioeconomic History  . Marital status: Married    Spouse name: Not on file  . Number of children: 1  .  Years of education: Not on file  . Highest education level: Not on file  Occupational History  . Occupation: CO-OWNER    Employer: [employer]  Tobacco Use  . Smoking status: Former Smoker    Packs/day: 1.00    Years: 20.00    Pack years: 20.00    Types: Cigarettes    Quit date: 08/27/1982    Years since quitting: 37.1  . Smokeless tobacco: Never Used  Vaping Use  . Vaping Use: Never used  Substance and Sexual Activity  . Alcohol use: Yes    Alcohol/week: 14.0 standard drinks     Types: 14 Standard drinks or equivalent per week    Comment: beer x 2 per day with meals/ may slow down   . Drug use: Yes    Types: Marijuana    Comment: once per day  . Sexual activity: Not on file  Other Topics Concern  . Not on file  Social History Narrative   Married   [place] 2   2 children    3-4 c caffeine    Social Determinants of Health   Financial Resource Strain: Low Risk   . Difficulty of Paying Living Expenses: Not hard at all  Food Insecurity: No Food Insecurity  . Worried About Charity fundraiser in the Last Year: Never true  . Ran Out of Food in the Last Year: Never true  Transportation Needs: No Transportation Needs  . Lack of Transportation (Medical): No  . Lack of Transportation (Non-Medical): No  Physical Activity: Sufficiently Active  . Days of Exercise per Week: 7 days  . Minutes of Exercise per Session: 90 min  Stress: No Stress Concern Present  . Feeling of Stress : Only a little  Social Connections: Unknown  . Frequency of Communication with Friends and Family: More than three times a week  . Frequency of Social Gatherings with Friends and Family: Once a week  . Attends Religious Services: Not on file  . Active Member of Clubs or Organizations: Not on file  . Attends Archivist Meetings: Not on file  . Marital Status: Married  Human resources officer Violence:   . Fear of Current or Ex-Partner:   . Emotionally Abused:   Marland Kitchen Physically Abused:   . Sexually Abused:     Medications:   Current Outpatient Medications on File Prior to Visit  Medication Sig Dispense Refill  . aspirin EC 81 MG tablet Take 81 mg by mouth daily.    . benazepril (LOTENSIN) 40 MG tablet TAKE 1 TABLET BY MOUTH EVERY DAY 90 tablet 2  . cholecalciferol (VITAMIN D3) 25 MCG (1000 UNIT) tablet Take 1,000 Units by mouth daily.    . melatonin 5 MG TABS Take 5 mg by mouth.    Marland Kitchen omeprazole (PRILOSEC) 20 MG capsule Take 20 mg by mouth 4 (four) times daily as needed.     .  rosuvastatin (CRESTOR) 5 MG tablet Take 1 tablet (5 mg total) by mouth daily. 90 tablet 3  . Turmeric (QC TUMERIC COMPLEX PO) Take by mouth.     No current facility-administered medications on file prior to visit.    Allergies:  No Known Allergies  Physical Exam General: well developed, well nourished elderly Caucasian male, seated, in no evident distress Head: head normocephalic and atraumatic.   Neck: supple with no carotid or supraclavicular bruits Cardiovascular: regular rate and rhythm, no murmurs Musculoskeletal: no deformity Skin:  no rash/petichiae Vascular:  Normal pulses all extremities  Neurologic  Exam Mental Status: Awake and fully alert. Oriented to place and time. Recent and remote memory intact. Attention span, concentration and fund of knowledge appropriate. Mood and affect appropriate.  Diminished recall 2/3.  Able to name 15 animals which can walk on 4 legs.  Able to copy intersecting pentagons well.  Clock drawing 4/4. Cranial Nerves: Fundoscopic exam reveals sharp disc margins. Pupils equal, briskly reactive to light. Extraocular movements full without nystagmus. Visual fields full to confrontation. Hearing slightly diminished bilaterally despite hearing aids.. Facial sensation intact. Face, tongue, palate moves normally and symmetrically.  Motor: Normal bulk and tone. Normal strength in all tested extremity muscles. Sensory.: intact to touch , pinprick , position and vibratory sensation.  Coordination: Rapid alternating movements normal in all extremities. Finger-to-nose and heel-to-shin performed accurately bilaterally. Gait and Station: Arises from chair without difficulty. Stance is normal. Gait demonstrates normal stride length and balance . Able to heel, toe and tandem walk without difficulty.  Reflexes: 1+ and symmetric. Toes downgoing.   NIHSS  0 Modified Rankin  0   ASSESSMENT: 79 year old Caucasian male with transient episode of loss of consciousness  followed by some confusion and disorientation of unclear etiology possibly unwitnessed seizure with postictal confusion but TIA is also possible though less likely.  He has longstanding history of short-term memory difficulties due to mild cognitive impairment and family history of Alzheimer's.     PLAN: I had a long d/w patient about his recent episode of brief loss of consciousness and discussed differential diagnosis including TIA versus unwitnessed seizure with postictal confusion, risk for recurrent stroke/TIAs, personally independently reviewed imaging studies and stroke evaluation results and answered questions.Continue aspirin 81 mg daily  for secondary stroke prevention and maintain strict control of hypertension with blood pressure goal below 130/90, diabetes with hemoglobin A1c goal below 6.5% and lipids with LDL cholesterol goal below 70 mg/dL. I also advised the patient to eat a healthy diet with plenty of whole grains, cereals, fruits and vegetables, exercise regularly and maintain ideal body weight.  Check EEG for seizure activity.  We also discussed his long-term mild memory difficulties and mild cognitive impairment.  I encouraged him to increase participation in cognitively challenging activities like solving crossword puzzles, playing bridge and sodoku.  We also discussed memory compensation strategies.  I advised him not to drive for 6 months as per [facility].  Greater than 50% time during this 45-minute consultation was it was spent on counseling and coordination of care about his episode of loss of consciousness and discussion about seizure and answering questions.  Followup in the future with me in 2 months or call earlier if necessary. Antony Contras, MD  [facility] [address] [place], [place] [address]  Phone [phone] Fax [phone] Note: This document was prepared with digital dictation and possible smart phrase technology. Any  transcriptional errors that result from this process are unintentional.

## 2019-10-03 NOTE — Patient Instructions (Signed)
I had a long d/w patient about his recent episode of brief loss of consciousness and discussed differential diagnosis including TIA versus unwitnessed seizure with postictal confusion, risk for recurrent stroke/TIAs, personally independently reviewed imaging studies and stroke evaluation results and answered questions.Continue aspirin 81 mg daily  for secondary stroke prevention and maintain strict control of hypertension with blood pressure goal below 130/90, diabetes with hemoglobin A1c goal below 6.5% and lipids with LDL cholesterol goal below 70 mg/dL. I also advised the patient to eat a healthy diet with plenty of whole grains, cereals, fruits and vegetables, exercise regularly and maintain ideal body weight.  Check EEG for seizure activity.  We also discussed his long-term mild memory difficulties and mild cognitive impairment.  I encouraged him to increase participation in cognitively challenging activities like solving crossword puzzles, playing bridge and sodoku.  We also discussed memory compensation strategies.  I advised him not to drive for 6 months as per [facility] law followup in the future with me in 2 months or call earlier if necessary. Memory Compensation Strategies  1. Use "WARM" strategy.  W= write it down  A= associate it  R= repeat it  M= make a mental note  2.   You can keep a Social worker.  Use a 3-ring notebook with sections for the following: calendar, important names and phone numbers,  medications, doctors' names/phone numbers, lists/reminders, and a section to journal what you did  each day.   3.    Use a calendar to write appointments down.  4.    Write yourself a schedule for the day.  This can be placed on the calendar or in a separate section of the Memory Notebook.  Keeping a  regular schedule can help memory.  5.    Use medication organizer with sections for each day or morning/evening pills.  You may need help loading it  6.    Keep a basket, or  pegboard by the door.  Place items that you need to take out with you in the basket or on the pegboard.  You may also want to  include a message board for reminders.  7.    Use sticky notes.  Place sticky notes with reminders in a place where the task is performed.  For example: " turn off the  stove" placed by the stove, "lock the door" placed on the door at eye level, " take your medications" on  the bathroom mirror or by the place where you normally take your medications.  8.    Use alarms/timers.  Use while cooking to remind yourself to check on food or as a reminder to take your medicine, or as a  reminder to make a call, or as a reminder to perform another task, etc.

## 2019-10-05 ENCOUNTER — Other Ambulatory Visit: Payer: Self-pay

## 2019-10-05 MED ORDER — FISH OIL 1000 MG PO CPDR: 1.0000 | DELAYED_RELEASE_CAPSULE | Freq: Every morning | ORAL | 1 refills | Status: DC

## 2019-10-11 ENCOUNTER — Encounter: Payer: Self-pay | Admitting: Family Medicine

## 2019-10-16 DIAGNOSIS — R69 Illness, unspecified: Secondary | ICD-10-CM | POA: Diagnosis not present

## 2019-10-24 ENCOUNTER — Other Ambulatory Visit: Payer: Medicare HMO

## 2019-10-25 NOTE — Telephone Encounter (Signed)
Transmission from 10/25/19 reviewed. No episodes noted on 09/12/19. No recent HVR episodes. <1% AT/AF burden, EGMs show brief RA lead noise. RA and RV noise reversions previously noted, most recently occurred on 12/26/18, known issue (2088 leads). AP 2.0%, VP <1% since counters last cleared in 03/2017. Rosann Auerbach, RN, made aware of no recorded arrhythmias to correlate with date of syncopal episodes.

## 2019-10-26 ENCOUNTER — Encounter: Payer: Self-pay | Admitting: Internal Medicine

## 2019-10-31 ENCOUNTER — Ambulatory Visit: Payer: Medicare HMO | Admitting: Neurology

## 2019-10-31 DIAGNOSIS — G459 Transient cerebral ischemic attack, unspecified: Secondary | ICD-10-CM

## 2019-10-31 DIAGNOSIS — R55 Syncope and collapse: Secondary | ICD-10-CM | POA: Diagnosis not present

## 2019-11-06 ENCOUNTER — Telehealth: Payer: Self-pay

## 2019-11-06 ENCOUNTER — Ambulatory Visit (INDEPENDENT_AMBULATORY_CARE_PROVIDER_SITE_OTHER): Payer: Medicare HMO | Admitting: *Deleted

## 2019-11-06 DIAGNOSIS — I495 Sick sinus syndrome: Secondary | ICD-10-CM | POA: Diagnosis not present

## 2019-11-06 LAB — CUP PACEART REMOTE DEVICE CHECK
Battery Remaining Longevity: 64 mo
Battery Remaining Percentage: 46 %
Battery Voltage: 2.86 V
Brady Statistic AP VP Percent: 1 %
Brady Statistic AP VS Percent: 2.1 %
Brady Statistic AS VP Percent: 1 %
Brady Statistic AS VS Percent: 98 %
Brady Statistic RA Percent Paced: 2 %
Brady Statistic RV Percent Paced: 1 %
Date Time Interrogation Session: 20210719120502
Implantable Lead Implant Date: 20111010
Implantable Lead Implant Date: 20111010
Implantable Lead Location: 753859
Implantable Lead Location: 753860
Implantable Pulse Generator Implant Date: 20111010
Lead Channel Impedance Value: 260 Ohm
Lead Channel Impedance Value: 300 Ohm
Lead Channel Pacing Threshold Amplitude: 0.75 V
Lead Channel Pacing Threshold Amplitude: 1 V
Lead Channel Pacing Threshold Pulse Width: 0.4 ms
Lead Channel Pacing Threshold Pulse Width: 0.4 ms
Lead Channel Sensing Intrinsic Amplitude: 2.6 mV
Lead Channel Sensing Intrinsic Amplitude: 4.6 mV
Lead Channel Setting Pacing Amplitude: 1.25 V
Lead Channel Setting Pacing Amplitude: 1.75 V
Lead Channel Setting Pacing Pulse Width: 0.4 ms
Lead Channel Setting Sensing Sensitivity: 2 mV
Pulse Gen Model: 2110
Pulse Gen Serial Number: 7156802

## 2019-11-06 NOTE — Telephone Encounter (Signed)
The pt needed help sending a manual transmission. I called tech support to get additional help.

## 2019-11-06 NOTE — Progress Notes (Signed)
Kindly inform the patient that EEG study was normal

## 2019-11-08 NOTE — Progress Notes (Signed)
Remote pacemaker transmission.   

## 2019-11-13 ENCOUNTER — Encounter: Payer: Self-pay | Admitting: Neurology

## 2019-11-13 ENCOUNTER — Ambulatory Visit: Payer: Medicare HMO | Admitting: Neurology

## 2019-11-13 VITALS — BP 159/70 | HR 64 | Ht 67.0 in | Wt 164.0 lb

## 2019-11-13 DIAGNOSIS — R4789 Other speech disturbances: Secondary | ICD-10-CM | POA: Diagnosis not present

## 2019-11-13 DIAGNOSIS — G3184 Mild cognitive impairment, so stated: Secondary | ICD-10-CM | POA: Diagnosis not present

## 2019-11-13 MED ORDER — PREVAGEN 10 MG PO CAPS: 1.0000 | ORAL_CAPSULE | Freq: Every morning | ORAL | 1 refills | Status: DC

## 2019-11-13 NOTE — Progress Notes (Signed)
[facility] [address] [place]. [place] [address] [phone]       OFFICE FOLLOW UP VISIT NOTE  [name] Date of Birth:  11-04-1940 Medical Record Number:  [id number]   Referring MD: Carolann Littler  Reason for Referral: Loss of consciousness episode  JIR:[id number] visit 10/03/2019 ; [name] is a pleasant 79 year old Caucasian male seen today for initial office consultation visit.  Is accompanied by his wife.  History is obtained from him and review of referral notes and imaging results though actual films were not available at the time of this visit.  His past medical history of hypertension, hyperlipidemia, sinus node dysfunction status post pacemaker insertion who was visiting [place] when on 09/12/2019 he had a transient episode of confusion with brief loss of consciousness.  He states that he woke up on that day with an episode of diarrhea and went to the bathroom and then subsequently made himself a cup of coffee.  Last thing he remembers is sitting in a chair and drinking coffee.  Reportedly his sister got up and found him asleep on the chair and in awkward position.  She tried to arouse him but he did not wake up and sounded confused and his speech was garbled and did not make sense.  EMS were called who found him to be arousable but he did not have any focal deficits to raise concern for stroke.  He had one episode of vomiting.  By the time he reached the hospital he was slightly disoriented and confused but had no focal deficits.  His lab work was pretty unremarkable urine drug screen was negative.  Alcohol test was negative.  CT scan of the head showed generalized cortical volume loss with changes of small vessel disease and slight enlargement of the ventricles disproportionate to the degree of atrophy as per radiology report.  CT angiogram of brain and neck was recommended and the neurologist consultation but I do not have   copy of the report she is CT scan  of the abdomen pelvis showed right lower kidney pole mass suspicious for renal cell carcinoma.  Carotid ultrasound showed no significant extracranial stenosis.  EEG was not done.  Patient states he still does not remember all the details of the episode.  He has a pacemaker since 2010 which is MRI incompatible hence it was not done.  Patient states is felt fine since his come here has had no further episodes of loss of consciousness.  He denies any prior history of seizures, significant head injury with loss of consciousness strokes or TIAs.  He does have a long-term mild memory difficulties which are mostly short-term.  These are not progressive as per him and his wife.  He does have a family history of dementia however.  He denies symptoms suggestive of normal pressure hydrocephalus in the form of progressive gait and balance difficulties, bladder incontinence or progressive memory loss. Update 11/13/2019 : He returns for follow-up after last visit 6 weeks ago.  Is accompanied by his wife.  Patient states has not had any seizure-like episode however about a month ago he had a minor spell when he was sitting in the porch outside his house when his wife noticed him to be slightly confused and disoriented.  He had trouble remembering the names of one of the neighbors.  This lasted barely a few minutes and returned back to baseline.  He denies any accompanying headache, tongue bite, injury or incontinence.  The patient continues  to have short-term memory difficulties which are unchanged.  He often misplaces his phone.  He is not participating in any cognitively challenging activities.  Is tolerating aspirin well without bruising or bleeding.  He takes fish oil every day.  He has no new complaints.  He did have EEG done on 11/01/2019 which was normal. ROS:   14 system review of systems is positive for memory difficulties,  , confusion, disorientation and all other systems negative PMH:  Past Medical History:    Diagnosis Date  . GERD (gastroesophageal reflux disease)   . HYPERLIPIDEMIA 07/17/2008  . HYPERTENSION 07/17/2008  . Sinus node dysfunction ([place]) 01/27/2011   a. s/p STJ dual chamber PPM   . Sleep apnea    doesnt wear cpap    Social History:  Social History   Socioeconomic History  . Marital status: Married    Spouse name: Not on file  . Number of children: 1  . Years of education: Not on file  . Highest education level: Not on file  Occupational History  . Occupation: CO-OWNER    Employer: [employer]  Tobacco Use  . Smoking status: Former Smoker    Packs/day: 1.00    Years: 20.00    Pack years: 20.00    Types: Cigarettes    Quit date: 08/27/1982    Years since quitting: 37.2  . Smokeless tobacco: Never Used  Vaping Use  . Vaping Use: Never used  Substance and Sexual Activity  . Alcohol use: Yes    Alcohol/week: 14.0 standard drinks    Types: 14 Standard drinks or equivalent per week    Comment: beer x 2 per day with meals/ may slow down   . Drug use: Yes    Types: Marijuana    Comment: once per day  . Sexual activity: Not on file  Other Topics Concern  . Not on file  Social History Narrative   Married   [place] 2   2 children    3-4 c caffeine    Social Determinants of Health   Financial Resource Strain: Low Risk   . Difficulty of Paying Living Expenses: Not hard at all  Food Insecurity: No Food Insecurity  . Worried About Charity fundraiser in the Last Year: Never true  . Ran Out of Food in the Last Year: Never true  Transportation Needs: No Transportation Needs  . Lack of Transportation (Medical): No  . Lack of Transportation (Non-Medical): No  Physical Activity: Sufficiently Active  . Days of Exercise per Week: 7 days  . Minutes of Exercise per Session: 90 min  Stress: No Stress Concern Present  . Feeling of Stress : Only a little  Social Connections: Unknown  . Frequency of Communication with Friends and Family: More than three times a week  .  Frequency of Social Gatherings with Friends and Family: Once a week  . Attends Religious Services: Not on file  . Active Member of Clubs or Organizations: Not on file  . Attends Archivist Meetings: Not on file  . Marital Status: Married  Human resources officer Violence:   . Fear of Current or Ex-Partner:   . Emotionally Abused:   Marland Kitchen Physically Abused:   . Sexually Abused:     Medications:   Current Outpatient Medications on File Prior to Visit  Medication Sig Dispense Refill  . aspirin EC 81 MG tablet Take 81 mg by mouth daily.    . benazepril (LOTENSIN) 40 MG tablet TAKE 1 TABLET  BY MOUTH EVERY DAY 90 tablet 2  . cholecalciferol (VITAMIN D3) 25 MCG (1000 UNIT) tablet Take 1,000 Units by mouth daily.    . melatonin 5 MG TABS Take 5 mg by mouth.    . Omega-3 Fatty Acids (FISH OIL) 1000 MG CPDR Take 1 capsule by mouth every morning. 30 capsule 1  . omeprazole (PRILOSEC) 20 MG capsule Take 20 mg by mouth 4 (four) times daily as needed.     . rosuvastatin (CRESTOR) 5 MG tablet Take 1 tablet (5 mg total) by mouth daily. 90 tablet 3  . Turmeric (QC TUMERIC COMPLEX PO) Take by mouth.     No current facility-administered medications on file prior to visit.    Allergies:  No Known Allergies  Physical Exam General: well developed, well nourished elderly Caucasian male, seated, in no evident distress Head: head normocephalic and atraumatic.   Neck: supple with no carotid or supraclavicular bruits Cardiovascular: regular rate and rhythm, no murmurs Musculoskeletal: no deformity Skin:  no rash/petichiae Vascular:  Normal pulses all extremities  Neurologic Exam Mental Status: Awake and fully alert. Oriented to place and time. Recent and remote memory intact. Attention span, concentration and fund of knowledge appropriate. Mood and affect appropriate.  Intact recall 3/3.  Able to name 12 animals which can walk on 4 legs.  Able to copy intersecting pentagons well.  Clock drawing  4/4. Cranial Nerves: Fundoscopic exam reveals sharp disc margins. Pupils equal, briskly reactive to light. Extraocular movements full without nystagmus. Visual fields full to confrontation. Hearing slightly diminished bilaterally despite hearing aids.. Facial sensation intact. Face, tongue, palate moves normally and symmetrically.  Motor: Normal bulk and tone. Normal strength in all tested extremity muscles. Sensory.: intact to touch , pinprick , position and vibratory sensation.  Coordination: Rapid alternating movements normal in all extremities. Finger-to-nose and heel-to-shin performed accurately bilaterally. Gait and Station: Arises from chair without difficulty. Stance is normal. Gait demonstrates normal stride length and balance . Able to heel, toe and tandem walk without difficulty.  Reflexes: 1+ and symmetric. Toes downgoing.     ASSESSMENT: 79 year old Caucasian male with transient episode of loss of consciousness followed by some confusion and disorientation of unclear etiology possibly unwitnessed seizure with postictal confusion but TIA is also possible though less likely.  He has longstanding history of short-term memory difficulties due to mild cognitive impairment and family history of Alzheimer's.     PLAN: I had a long discussion the patient and his wife regarding his recent episode of transient confusion and speech difficulties being of unclear etiology.  I discussed results of EEG and differential diagnosis of TIA versus seizure.  I recommend he continue aspirin 81 mg daily for stroke prevention and maintain strict control of hypertension with blood pressure goal below 130/90 and lipids with LDL cholesterol goal below 70 mg percent.  He was encouraged to eat a healthy diet and be active and exercise regularly.  We also discussed his memory difficulties and mild cognitive impairment which appears stable.  I recommend he continue taking fish oil and start Prevagen 10 mg  1 tablet  daily.  We also discussed memory compensation strategies.  He was advised not to drive till his return visit with me in 2 to 3 months and he will call earlier if necessary. Greater than 50% time during this 30 minute  was spent on counseling and coordination of care about his episode of loss of consciousness and discussion about TIA versus seizure , mild cognitive impairment  and answering questions.  Followup in the future with me in 2 months or call earlier if necessary. Antony Contras, MD  [facility] [address] [place], [place] [address]  Phone [phone] Fax [phone] Note: This document was prepared with digital dictation and possible smart phrase technology. Any transcriptional errors that result from this process are unintentional.

## 2019-11-13 NOTE — Patient Instructions (Addendum)
I had a long discussion the patient and his wife regarding his recent episode of transient confusion and speech difficulties being of unclear etiology.  I discussed results of EEG and differential diagnosis of TIA versus seizure.  I recommend he continue aspirin 81 mg daily for stroke prevention and maintain strict control of hypertension with blood pressure goal below 130/90 and lipids with LDL cholesterol goal below 70 mg percent.  He was encouraged to eat a healthy diet and be active and exercise regularly.  We also discussed his memory difficulties and mild cognitive impairment which appears stable.  I recommend he continue taking fish oil and start Prevagen 10 mg  1 tablet daily.  We also discussed memory compensation strategies.  He was advised not to drive till his return visit with me in 2 to 3 months and he will call earlier if necessary. Memory Compensation Strategies  1. Use "WARM" strategy.  W= write it down  A= associate it  R= repeat it  M= make a mental note  2.   You can keep a Social worker.  Use a 3-ring notebook with sections for the following: calendar, important names and phone numbers,  medications, doctors' names/phone numbers, lists/reminders, and a section to journal what you did  each day.   3.    Use a calendar to write appointments down.  4.    Write yourself a schedule for the day.  This can be placed on the calendar or in a separate section of the Memory Notebook.  Keeping a  regular schedule can help memory.  5.    Use medication organizer with sections for each day or morning/evening pills.  You may need help loading it  6.    Keep a basket, or pegboard by the door.  Place items that you need to take out with you in the basket or on the pegboard.  You may also want to  include a message board for reminders.  7.    Use sticky notes.  Place sticky notes with reminders in a place where the task is performed.  For example: " turn off the  stove" placed by the  stove, "lock the door" placed on the door at eye level, " take your medications" on  the bathroom mirror or by the place where you normally take your medications.  8.    Use alarms/timers.  Use while cooking to remind yourself to check on food or as a reminder to take your medicine, or as a  reminder to make a call, or as a reminder to perform another task, etc.

## 2019-11-15 ENCOUNTER — Ambulatory Visit: Payer: Medicare HMO | Admitting: Neurology

## 2019-11-22 ENCOUNTER — Encounter: Payer: Self-pay | Admitting: Family Medicine

## 2019-11-22 ENCOUNTER — Ambulatory Visit: Payer: Medicare HMO | Admitting: Family Medicine

## 2019-11-22 ENCOUNTER — Other Ambulatory Visit: Payer: Self-pay

## 2019-11-22 VITALS — BP 124/74 | HR 66 | Ht 67.0 in | Wt 162.0 lb

## 2019-11-22 DIAGNOSIS — M7121 Synovial cyst of popliteal space [Baker], right knee: Secondary | ICD-10-CM | POA: Diagnosis not present

## 2019-11-22 DIAGNOSIS — M1711 Unilateral primary osteoarthritis, right knee: Secondary | ICD-10-CM

## 2019-11-22 NOTE — Progress Notes (Signed)
[place] Phone: [phone] Subjective:   [name] [name] [name], am serving as a scribe for Dr. Hulan [name].   I'm seeing this patient by the request  of:  [name] Post, MD  CC: knee pain   [id number]   08/10/2019 Doing much better after the operation of her cyst as well as a custom brace helping with the instability of the knee.  Patient should do relatively well and can follow-up with me again as needed.  Update 11/22/2019 [name] [name] [name] is a 79 y.o. male coming in with complaint of knee pain. Patient states that his pain is now over medial aspect of right knee. Pain is less in back of the knee. Using custom brace.       Past Medical History:  Diagnosis Date  . GERD (gastroesophageal reflux disease)   . HYPERLIPIDEMIA 07/17/2008  . HYPERTENSION 07/17/2008  . Sinus node dysfunction ([place]) 01/27/2011   a. s/p STJ dual chamber PPM   . Sleep apnea    doesnt wear cpap   Past Surgical History:  Procedure Laterality Date  . COLONOSCOPY    . PACEMAKER PLACEMENT     STJ dual chamber PPM implant 2011  . POLYPECTOMY     Social History   Socioeconomic History  . Marital status: Married    Spouse name: Not on file  . Number of children: 1  . Years of education: Not on file  . Highest education level: Not on file  Occupational History  . Occupation: CO-OWNER    Employer: [name] [name] [name]  Tobacco Use  . Smoking status: Former Smoker    Packs/day: 1.00    Years: 20.00    Pack years: 20.00    Types: Cigarettes    Quit date: 08/27/1982    Years since quitting: 37.2  . Smokeless tobacco: Never Used  Vaping Use  . Vaping Use: Never used  Substance and Sexual Activity  . Alcohol use: Yes    Alcohol/week: 14.0 standard drinks    Types: 14 Standard drinks or equivalent per week    Comment: beer x 2 per day with meals/ may slow down   . Drug use: Yes    Types: Marijuana    Comment: once per day  .  Sexual activity: Not on file  Other Topics Concern  . Not on file  Social History Narrative   Married   [name] [name] 2   2 children    3-4 c caffeine    Social Determinants of Health   Financial Resource Strain: Low Risk   . Difficulty of Paying Living Expenses: Not hard at all  Food Insecurity: [name] [name] Food Insecurity  . Worried About Charity fundraiser in the Last Year: Never true  . Ran Out of Food in the Last Year: Never true  Transportation Needs: [name] [name] Transportation Needs  . Lack of Transportation (Medical): [name] [name]  . Lack of Transportation (Non-Medical): [name] [name]  Physical Activity: Sufficiently Active  . Days of Exercise per Week: 7 days  . Minutes of Exercise per Session: 90 min  Stress: [name] [name] Stress Concern Present  . Feeling of Stress : Only a little  Social Connections: Unknown  . Frequency of Communication with Friends and Family: More than three times a week  . Frequency of Social Gatherings with Friends and Family: Once a week  . Attends Religious Services: Not on file  . Active Member of Clubs or Organizations: Not on file  .  Attends Archivist Meetings: Not on file  . Marital Status: Married   [name] [name] Known Allergies Family History  Problem Relation Age of Onset  . Hypertension Mother   . Stroke Mother   . Colitis Father   . Hypertension Father   . Other Father        esophageal stricture  . Alcohol abuse Maternal Aunt   . Colon cancer Neg Hx   . Rectal cancer Neg Hx   . Stomach cancer Neg Hx   . Esophageal cancer Neg Hx      Current Outpatient Medications (Cardiovascular):  .  benazepril (LOTENSIN) 40 MG tablet, TAKE 1 TABLET BY MOUTH EVERY DAY .  rosuvastatin (CRESTOR) 5 MG tablet, Take 1 tablet (5 mg total) by mouth daily.   Current Outpatient Medications (Analgesics):  .  aspirin EC 81 MG tablet, Take 81 mg by mouth daily.   Current Outpatient Medications (Other):  .  Apoaequorin (PREVAGEN) 10 MG CAPS, Take 1 capsule by mouth every morning. .  cholecalciferol  (VITAMIN D3) 25 MCG (1000 UNIT) tablet, Take 1,000 Units by mouth daily. .  melatonin 5 MG TABS, Take 5 mg by mouth. .  Omega-3 Fatty Acids (FISH OIL) 1000 MG CPDR, Take 1 capsule by mouth every morning. Marland Kitchen  omeprazole (PRILOSEC) 20 MG capsule, Take 20 mg by mouth 4 (four) times daily as needed.  .  Turmeric (QC TUMERIC COMPLEX PO), Take by mouth.   Reviewed prior external information including notes and imaging from  primary care provider As well as notes that were available from care everywhere and other healthcare systems.  Past medical history, social, surgical and family history all reviewed in electronic medical record.  [name] [name] pertanent information unless stated regarding to the chief complaint.   Review of Systems:  [name] [name] headache, visual changes, nausea, vomiting, diarrhea, constipation, dizziness, abdominal pain, skin rash, fevers, chills, night sweats, weight loss, swollen lymph nodes, body aches, joint swelling, chest pain, shortness of breath, mood changes. POSITIVE muscle aches  Objective  Blood pressure 124/74, pulse 66, height 5\' 7"  (1.702 m), weight 162 lb (73.5 kg), SpO2 97 %.   General: [name] [name] apparent distress alert and oriented x3 mood and affect normal, dressed appropriately.  HEENT: Pupils equal, extraocular movements intact  Respiratory: Patient's speak in full sentences and does not appear short of breath  Cardiovascular: [name] [name] lower extremity edema, non tender, [name] [name] erythema  Gait antalgic. MSK: Knee: valgus deformity noted. Large thigh to calf ratio.  Fullness noted over the popliteal area with a large Baker's cyst. Tender to palpation over medial and PF joint line.  ROM lacks the last 15 degrees of flexion instability with valgus force.  painful patellar compression. Patellar glide with moderate crepitus. Patellar and quadriceps tendons unremarkable. Hamstring and quadriceps strength is normal. Contralateral knee shows mild arthritic changes  Procedure: Real-time  Ultrasound Guided Injection of right knee Device: GE Logiq Q7 Ultrasound guided injection is preferred based studies that show increased duration, increased effect, greater accuracy, decreased procedural pain, increased response rate, and decreased cost with ultrasound guided versus blind injection.  Verbal informed consent obtained.  Time-out conducted.  Noted [name] [name] overlying erythema, induration, or other signs of local infection.  Skin prepped in a sterile fashion.  Local anesthesia: Topical Ethyl chloride.  With sterile technique and under real time ultrasound guidance: With a 22-gauge 2 inch needle patient was injected with 4 cc of 0.5% Marcaine and aspirated 30cc and injected 1 cc of Kenalog 40 mg/dL. This  was from a posterior approach.  Completed without difficulty  Pain immediately resolved suggesting accurate placement of the medication.  Advised to call if fevers/chills, erythema, induration, drainage, or persistent bleeding.  Images permanently stored and available for review in the ultrasound unit.  Impression: Technically successful ultrasound guided injection.    Impression and Recommendations:     The above documentation has been reviewed and is accurate and complete Lyndal Pulley, DO       Note: This dictation was prepared with Dragon dictation along with smaller phrase technology. Any transcriptional errors that result from this process are unintentional.

## 2019-11-22 NOTE — Patient Instructions (Signed)
See me again in 6 weeks 

## 2019-11-22 NOTE — Assessment & Plan Note (Signed)
Degenerative changes of the knee.  Discussed icing regimen and home exercise, which activities to do which wants to avoid.  Increase activity slowly.  Continue the brace.  Aspirated the Baker's cyst today and hopefully this will be beneficial.  Follow-up again in 6 weeks.

## 2019-12-06 ENCOUNTER — Ambulatory Visit (AMBULATORY_SURGERY_CENTER): Payer: Self-pay

## 2019-12-06 ENCOUNTER — Other Ambulatory Visit: Payer: Self-pay

## 2019-12-06 VITALS — Ht 67.0 in | Wt 164.2 lb

## 2019-12-06 DIAGNOSIS — Z86010 Personal history of colonic polyps: Secondary | ICD-10-CM

## 2019-12-06 MED ORDER — SUTAB 1479-225-188 MG PO TABS: 12.0000 | ORAL_TABLET | ORAL | 0 refills | Status: DC

## 2019-12-06 NOTE — Progress Notes (Signed)
No allergies to soy or egg Pt is not on blood thinners or diet pills Denies issues with sedation/intubation Denies atrial flutter/fib Denies constipation   Emmi instructions given to pt  Pt is aware of Covid safety and care partner requirements.  

## 2019-12-07 ENCOUNTER — Encounter: Payer: Self-pay | Admitting: Internal Medicine

## 2019-12-11 ENCOUNTER — Encounter (HOSPITAL_COMMUNITY): Payer: Self-pay | Admitting: Emergency Medicine

## 2019-12-11 ENCOUNTER — Other Ambulatory Visit: Payer: Self-pay

## 2019-12-11 ENCOUNTER — Emergency Department (HOSPITAL_COMMUNITY)
Admission: EM | Admit: 2019-12-11 | Discharge: 2019-12-11 | Disposition: A | Discharge status: Home / Self Care | Payer: Medicare HMO | Attending: Emergency Medicine | Admitting: Emergency Medicine

## 2019-12-11 ENCOUNTER — Telehealth: Payer: Self-pay | Admitting: Neurology

## 2019-12-11 DIAGNOSIS — Z950 Presence of cardiac pacemaker: Secondary | ICD-10-CM | POA: Insufficient documentation

## 2019-12-11 DIAGNOSIS — R569 Unspecified convulsions: Secondary | ICD-10-CM | POA: Diagnosis not present

## 2019-12-11 DIAGNOSIS — Z87891 Personal history of nicotine dependence: Secondary | ICD-10-CM | POA: Diagnosis not present

## 2019-12-11 DIAGNOSIS — R69 Illness, unspecified: Secondary | ICD-10-CM | POA: Diagnosis not present

## 2019-12-11 DIAGNOSIS — R404 Transient alteration of awareness: Secondary | ICD-10-CM | POA: Diagnosis not present

## 2019-12-11 DIAGNOSIS — Z79899 Other long term (current) drug therapy: Secondary | ICD-10-CM | POA: Insufficient documentation

## 2019-12-11 DIAGNOSIS — Z7982 Long term (current) use of aspirin: Secondary | ICD-10-CM | POA: Insufficient documentation

## 2019-12-11 DIAGNOSIS — G40909 Epilepsy, unspecified, not intractable, without status epilepticus: Secondary | ICD-10-CM | POA: Diagnosis not present

## 2019-12-11 DIAGNOSIS — R4182 Altered mental status, unspecified: Secondary | ICD-10-CM | POA: Diagnosis present

## 2019-12-11 DIAGNOSIS — I10 Essential (primary) hypertension: Secondary | ICD-10-CM | POA: Insufficient documentation

## 2019-12-11 DIAGNOSIS — R410 Disorientation, unspecified: Secondary | ICD-10-CM | POA: Diagnosis not present

## 2019-12-11 DIAGNOSIS — R4020 Unspecified coma: Secondary | ICD-10-CM | POA: Diagnosis not present

## 2019-12-11 DIAGNOSIS — I959 Hypotension, unspecified: Secondary | ICD-10-CM | POA: Diagnosis not present

## 2019-12-11 DIAGNOSIS — R0902 Hypoxemia: Secondary | ICD-10-CM | POA: Diagnosis not present

## 2019-12-11 DIAGNOSIS — R55 Syncope and collapse: Secondary | ICD-10-CM | POA: Diagnosis not present

## 2019-12-11 LAB — BASIC METABOLIC PANEL
Anion gap: 12 (ref 5–15)
BUN: 21 mg/dL (ref 8–23)
CO2: 21 mmol/L — ABNORMAL LOW (ref 22–32)
Calcium: 8.7 mg/dL — ABNORMAL LOW (ref 8.9–10.3)
Chloride: 103 mmol/L (ref 98–111)
Creatinine, Ser: 0.99 mg/dL (ref 0.61–1.24)
GFR calc Af Amer: >60 mL/min (ref >60)
GFR calc non Af Amer: >60 mL/min (ref >60)
Glucose, Bld: 104 mg/dL — ABNORMAL HIGH (ref 70–99)
Potassium: 4.2 mmol/L (ref 3.5–5.1)
Sodium: 136 mmol/L (ref 135–145)

## 2019-12-11 LAB — CBC
HCT: 42 % (ref 39.0–52.0)
Hemoglobin: 13.6 g/dL (ref 13.0–17.0)
MCH: 31.9 pg (ref 26.0–34.0)
MCHC: 32.4 g/dL (ref 30.0–36.0)
MCV: 98.6 fL (ref 80.0–100.0)
Platelets: 157 10*3/uL (ref 150–400)
RBC: 4.26 MIL/uL (ref 4.22–5.81)
RDW: 13.2 % (ref 11.5–15.5)
WBC: 6.3 10*3/uL (ref 4.0–10.5)
nRBC: 0 % (ref 0.0–0.2)

## 2019-12-11 MED ORDER — LEVETIRACETAM IN NACL 1500 MG/100ML IV SOLN
1500.0000 mg | Freq: Once | INTRAVENOUS | Status: AC
  Administered 2019-12-11: 1500 mg via INTRAVENOUS
  Filled 2019-12-11: qty 100

## 2019-12-11 MED ORDER — LEVETIRACETAM 500 MG PO TABS: 500.0000 mg | ORAL_TABLET | Freq: Two times a day (BID) | ORAL | 0 refills | Status: DC

## 2019-12-11 NOTE — ED Provider Notes (Signed)
[facility] Provider Note   CSN: [id number] Arrival date & time: 12/11/19  0440     History Chief Complaint  Patient presents with  . Altered Mental Status    [name] [name] is a 79 y.o. male.  HPI Level 5 caveat due to altered mental status. Patient brought in by EMS.  Patient's wife found him in bed with eyes open and grunting and shaking.  Unsure how long it lasted but states it felt like a long time.  Confusion after.  Has a English as a second language teacher.  Has had previous episodes of unresponsiveness that were thought to be seizure versus TIA by Dr. Leonie Man.  Not on antiepileptics at this time.    Past Medical History:  Diagnosis Date  . GERD (gastroesophageal reflux disease)   . HYPERLIPIDEMIA 07/17/2008  . HYPERTENSION 07/17/2008  . Sinus node dysfunction ([place]) 01/27/2011   a. s/p STJ dual chamber PPM   . Sleep apnea    doesnt wear cpap    Patient Active Problem List   Diagnosis Date Noted  . 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 Degenerative arthritis of right knee 07/13/2019  . Acute medial meniscal tear, right, initial encounter 07/13/2019  . Baker's cyst, right 07/13/2019  . GERD (gastroesophageal reflux disease) 01/06/2015  . Cerumen impaction 07/31/2014  . Abdominal pain, chronic, epigastric 04/17/2014  . Arthritis 04/17/2014  . Essential hypertension, benign 04/17/2014  . History of adenomatous polyp of colon 08/11/2011  . Sinus node dysfunction ([place]) 01/27/2011  . CHEST WALL PAIN, ACUTE 10/11/2009  . 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 SYNCOPE 07/24/2008  . Hyperlipidemia 07/17/2008  . Essential hypertension 07/17/2008    Past Surgical History:  Procedure Laterality Date  . COLONOSCOPY  2016  . PACEMAKER PLACEMENT     STJ dual chamber PPM implant 2011  . POLYPECTOMY         Family History  Problem Relation Age of Onset  . Hypertension Mother   . Stroke Mother   . Colitis Father   . Hypertension Father   . Other Father         esophageal stricture  . Alcohol abuse Maternal Aunt   . Rectal cancer Neg Hx   . Stomach cancer Neg Hx   . Esophageal cancer Neg Hx   . Colon polyps Neg Hx   . Colon cancer Neg Hx     Social History   Tobacco Use  . Smoking status: Former Smoker    Packs/day: 1.00    Years: 20.00    Pack years: 20.00    Types: Cigarettes    Quit date: 08/27/1982    Years since quitting: 37.3  . Smokeless tobacco: Never Used  Vaping Use  . Vaping Use: Never used  Substance Use Topics  . Alcohol use: Yes    Alcohol/week: 14.0 standard drinks    Types: 14 Standard drinks or equivalent per week    Comment: beer x 2 per day with meals/ may slow down   . Drug use: Yes    Types: Marijuana    Comment: once per day    Home Medications Prior to Admission medications   Medication Sig Start Date End Date Taking? Authorizing Provider  Acetaminophen (TYLENOL PO) Take by mouth.    [provider]  Apoaequorin (PREVAGEN) 10 MG CAPS Take 1 capsule by mouth every morning. Patient not taking: Reported on 12/06/2019 11/13/19   Garvin Fila, MD  Ascorbic Acid (VITAMIN C PO) Take by mouth.  [provider]  aspirin EC 81 MG tablet Take 81 mg by mouth daily.    [provider]  benazepril (LOTENSIN) 40 MG tablet TAKE 1 TABLET BY MOUTH EVERY DAY 05/22/19   Burchette, Alinda Sierras, MD  cholecalciferol (VITAMIN D3) 25 MCG (1000 UNIT) tablet Take 1,000 Units by mouth daily.    [provider]  Coenzyme Q10 (COQ10 PO) Take by mouth.    [provider]  levETIRAcetam (KEPPRA) 500 MG tablet Take 1 tablet (500 mg total) by mouth 2 (two) times daily. 12/11/19   Davonna Belling, MD  melatonin 5 MG TABS Take 5 mg by mouth.    [provider]  Misc Natural Products ([place]) Take by mouth.    [provider]  Omega-3 Fatty Acids (FISH OIL) 1000 MG CPDR Take 1 capsule by mouth every morning. 10/05/19   Garvin Fila, MD  omeprazole (PRILOSEC) 20  MG capsule Take 20 mg by mouth 4 (four) times daily as needed.     [provider]  rosuvastatin (CRESTOR) 5 MG tablet Take 1 tablet (5 mg total) by mouth daily. 09/20/19   Burchette, Alinda Sierras, MD  Sodium Sulfate-Mag Sulfate-KCl (SUTAB) [phone] MG TABS Take 12 tablets by mouth as directed. MANUFACTURER CODES!! BIN: [id number] PCN: CN GROUP: [id number] MEMBER ID: [id number];AJG AS CASH;NO PRIOR AUTHORIZATION 12/06/19   Pyrtle, Lajuan Lines, MD  Turmeric (QC TUMERIC COMPLEX PO) Take by mouth.    [provider]    Allergies    Patient has no known allergies.  Review of Systems   Review of Systems  Unable to perform ROS: Mental status change    Physical Exam Updated Vital Signs BP (!) 141/68   Pulse 77   Temp 98.2 F (36.8 C) (Oral)   Resp (!) 21   Ht 5\' 7"  (1.702 m)   Wt 72.6 kg   SpO2 94%   BMI 25.06 kg/m   Physical Exam Vitals and nursing note reviewed.  HENT:     Head: Atraumatic.  Eyes:     Pupils: Pupils are equal, round, and reactive to light.  Cardiovascular:     Rate and Rhythm: Regular rhythm.  Pulmonary:     Breath sounds: No wheezing or rhonchi.  Abdominal:     Tenderness: There is no abdominal tenderness.  Musculoskeletal:        General: No tenderness.  Skin:    General: Skin is warm.     Capillary Refill: Capillary refill takes less than 2 seconds.  Neurological:     Mental Status: He is alert.     Comments: Patient is awake but confused.  Can answer some questions but still some confusion.  Does not remember what happened.  Mental status improving during time in ER.     ED Results / Procedures / Treatments   Labs (all labs ordered are listed, but only abnormal results are displayed) Labs Reviewed  BASIC METABOLIC PANEL - Abnormal; Notable for the following components:      Result Value   CO2 21 (*)    Glucose, Bld 104 (*)    Calcium 8.7 (*)    All other components within normal limits  CBC    EKG EKG Interpretation  Date/Time:   Monday December 11 2019 04:45:54 EDT Ventricular Rate:  77 PR Interval:    QRS Duration: 105 QT Interval:  366 QTC Calculation: 415 R Axis:   -15 Text Interpretation: Sinus rhythm Borderline left axis deviation Confirmed by  Davonna Belling [phone]) on 12/11/2019 6:29:39 AM   Radiology No results found.  Procedures Procedures (including critical care time)  Medications Ordered in ED Medications  levETIRAcetam (KEPPRA) IVPB 1500 mg/ 100 mL premix (0 mg Intravenous Stopped 12/11/19 4314)    ED Course  I have reviewed the triage vital signs and the nursing notes.  Pertinent labs & imaging results that were available during my care of the patient were reviewed by me and considered in my medical decision making (see chart for details).    MDM Rules/Calculators/A&P                         Patient with mental status change.  Unresponsive at home.  Confused after.  Had some possible seizure activity.  Mental status improving now.  Previously seen by neurology for other syncope episodes and thought potentially related to seizure.  I think likely this could be a seizure now.  With this higher likelihood and recurrent events will treat with antiepileptics.  EKG reassuring.  Start on [place].  Discharge home. Final Clinical Impression(s) / ED Diagnoses Final diagnoses:  Seizure [facility])    Rx / DC Orders ED Discharge Orders         Ordered    levETIRAcetam (KEPPRA) 500 MG tablet  2 times daily        12/11/19 2767           Davonna Belling, MD 12/11/19 [id number]

## 2019-12-11 NOTE — ED Triage Notes (Signed)
Patient brought in by EMS. Patient's wife found patient awake in bed grunting and shaking (possible seizure). Patient able to follow some commands and answer some questions but is altered and confused and is not answering some questions correctly. CBG 100 and patient sinus tach on the monitor.

## 2019-12-11 NOTE — Telephone Encounter (Signed)
The patient's wife has returned the call - Toua Stites at [phone].  Reports patient had seizure this morning while in bed. His wife heard him making guttural noises and thought he was having a nightmare. When she turned on the light, he was unresponsive to her voice, drooling with a rigid body.  The event lasted about 1-2 minutes. He was transported to the ED by ambulance. He has experienced one other similar event in April 2021. EEG was normal.   The hospital prescribed levetiracetam 500mg , one tablet BID. She would like to a call to confirm that our office is in agreement with this treatment plan.  He has a pending appt w/ Dr. Leonie Man on 01/23/20.  I informed his wife that per [place] law, he should not drive until he is six months seizure free. She verbalized understanding.

## 2019-12-11 NOTE — Telephone Encounter (Signed)
Pt's wife called wanting to discuss the pt's ER visit this morning. She states he had another "episode" and she would like to discuss. Please advise.

## 2019-12-11 NOTE — Discharge Instructions (Signed)
Follow-up with neurology for further recommendations and treatment.

## 2019-12-11 NOTE — ED Notes (Signed)
Pts [place] pacemaker interrogated at this time.

## 2019-12-11 NOTE — Telephone Encounter (Signed)
Called the wife back and there was no answer. Left a detailed message advising that we have access to the [facility] ER records. Advised I was calling back cause she was wanting to discuss. Informed her that she could call back or send a mychart message, whatever is easiest for her. Informed that Dr Leonie Man is out of the office but if something is needed can always be able to address with work in [place].

## 2019-12-19 ENCOUNTER — Encounter: Payer: Self-pay | Admitting: Family Medicine

## 2019-12-19 ENCOUNTER — Telehealth: Payer: Self-pay | Admitting: *Deleted

## 2019-12-19 ENCOUNTER — Encounter: Payer: Medicare HMO | Admitting: Internal Medicine

## 2019-12-19 ENCOUNTER — Other Ambulatory Visit: Payer: Self-pay

## 2019-12-19 NOTE — Telephone Encounter (Signed)
Please advise 

## 2019-12-19 NOTE — Telephone Encounter (Signed)
While admitting patient for Colonoscopy procedure, he stated he recently had a seizure last week and was placed on new medication.    I spoke with Dr. Hilarie Fredrickson and per policy patient needs to be seizure free for 3 months prior to having procedure.  Patient is agreeable but stated he called our office last week to let us know about seizure, no notes are documenting phone call.  Recall placed for patient to have colonoscopy later this year and Sutabs given to patient per Dr. Vena Rua request.

## 2019-12-20 NOTE — Telephone Encounter (Signed)
FYI

## 2019-12-22 NOTE — Telephone Encounter (Signed)
FYI

## 2019-12-28 DIAGNOSIS — R69 Illness, unspecified: Secondary | ICD-10-CM | POA: Diagnosis not present

## 2020-01-02 ENCOUNTER — Encounter: Payer: Self-pay | Admitting: Family Medicine

## 2020-01-03 ENCOUNTER — Other Ambulatory Visit: Payer: Self-pay | Admitting: Family Medicine

## 2020-01-03 ENCOUNTER — Encounter: Payer: Self-pay | Admitting: Family Medicine

## 2020-01-03 DIAGNOSIS — G4733 Obstructive sleep apnea (adult) (pediatric): Secondary | ICD-10-CM

## 2020-01-04 ENCOUNTER — Encounter: Payer: Self-pay | Admitting: Family Medicine

## 2020-01-04 ENCOUNTER — Other Ambulatory Visit: Payer: Self-pay | Admitting: *Deleted

## 2020-01-04 MED ORDER — LEVETIRACETAM 500 MG PO TABS
500.0000 mg | ORAL_TABLET | Freq: Two times a day (BID) | ORAL | 0 refills | Status: DC
Start: 2020-01-04 — End: 2020-01-23

## 2020-01-04 NOTE — Telephone Encounter (Signed)
FYI

## 2020-01-04 NOTE — Telephone Encounter (Signed)
Refill Keppra once until he can see Dr Leonie Man.  I order CPAP mask as requested and DME will need to be sent to [facility].  Please print DME order and fax over to [facility]

## 2020-01-04 NOTE — Telephone Encounter (Signed)
Please advise 

## 2020-01-05 NOTE — Telephone Encounter (Signed)
Keppra was already refilled by another provider. DME faxed to [facility] [phone]

## 2020-01-11 ENCOUNTER — Ambulatory Visit: Payer: Self-pay

## 2020-01-11 ENCOUNTER — Other Ambulatory Visit: Payer: Self-pay

## 2020-01-11 ENCOUNTER — Ambulatory Visit (INDEPENDENT_AMBULATORY_CARE_PROVIDER_SITE_OTHER): Payer: Medicare HMO

## 2020-01-11 ENCOUNTER — Ambulatory Visit: Payer: Medicare HMO | Admitting: Family Medicine

## 2020-01-11 ENCOUNTER — Encounter: Payer: Self-pay | Admitting: Family Medicine

## 2020-01-11 VITALS — BP 130/78 | HR 65 | Ht 67.0 in | Wt 161.0 lb

## 2020-01-11 DIAGNOSIS — G8929 Other chronic pain: Secondary | ICD-10-CM

## 2020-01-11 DIAGNOSIS — M25561 Pain in right knee: Secondary | ICD-10-CM

## 2020-01-11 DIAGNOSIS — M1711 Unilateral primary osteoarthritis, right knee: Secondary | ICD-10-CM | POA: Diagnosis not present

## 2020-01-11 DIAGNOSIS — M7121 Synovial cyst of popliteal space [Baker], right knee: Secondary | ICD-10-CM | POA: Diagnosis not present

## 2020-01-11 NOTE — Patient Instructions (Addendum)
Good to see you Xray today [email or web address] for knee compression in the car and when you are not wearing the brace Continue exercises See me again in 4-6 weeks

## 2020-01-11 NOTE — Progress Notes (Signed)
[facility] [place] Phone: [phone] Subjective:    I'm seeing this patient by the request  of:  [name], MD  CC:   [id number]   11/22/2019 Degenerative changes of the knee.  Discussed icing regimen and home exercise, which activities to do which wants to avoid.  Increase activity slowly.  Continue the brace.  Aspirated the Baker's cyst today and hopefully this will be beneficial.  Follow-up again in 6 weeks.  Update 01/11/2020 [name] [name] is a 79 y.o. male coming in with complaint of right knee pain. Patient states it feels about the same. Believes it needs to be drained. Knee brace is helping. Plays pickle ball 3 times a week and using ice.  Patient states no significant increase in instability recently.     Past Medical History:  Diagnosis Date  . GERD (gastroesophageal reflux disease)   . HYPERLIPIDEMIA 07/17/2008  . HYPERTENSION 07/17/2008  . Sinus node dysfunction ([place]) 01/27/2011   a. s/p STJ dual chamber PPM   . Sleep apnea    doesnt wear cpap   Past Surgical History:  Procedure Laterality Date  . COLONOSCOPY  2016  . PACEMAKER PLACEMENT     STJ dual chamber PPM implant 2011  . POLYPECTOMY     Social History   Socioeconomic History  . Marital status: Married    Spouse name: Not on file  . Number of children: 1  . Years of education: Not on file  . Highest education level: Not on file  Occupational History  . Occupation: CO-OWNER    Employer: [employer]  Tobacco Use  . Smoking status: Former Smoker    Packs/day: 1.00    Years: 20.00    Pack years: 20.00    Types: Cigarettes    Quit date: 08/27/1982    Years since quitting: 37.4  . Smokeless tobacco: Never Used  Vaping Use  . Vaping Use: Never used  Substance and Sexual Activity  . Alcohol use: Yes    Alcohol/week: 14.0 standard drinks    Types: 14 Standard drinks or equivalent per week    Comment: beer x 2 per day with  meals/ may slow down   . Drug use: Yes    Types: Marijuana    Comment: once per day  . Sexual activity: Not on file  Other Topics Concern  . Not on file  Social History Narrative   Married   [place] 2   2 children    3-4 c caffeine    Social Determinants of Health   Financial Resource Strain: Low Risk   . Difficulty of Paying Living Expenses: Not hard at all  Food Insecurity: No Food Insecurity  . Worried About Charity fundraiser in the Last Year: Never true  . Ran Out of Food in the Last Year: Never true  Transportation Needs: No Transportation Needs  . Lack of Transportation (Medical): No  . Lack of Transportation (Non-Medical): No  Physical Activity: Sufficiently Active  . Days of Exercise per Week: 7 days  . Minutes of Exercise per Session: 90 min  Stress: No Stress Concern Present  . Feeling of Stress : Only a little  Social Connections: Unknown  . Frequency of Communication with Friends and Family: More than three times a week  . Frequency of Social Gatherings with Friends and Family: Once a week  . Attends Religious Services: Not on file  . Active Member of Clubs  or Organizations: Not on file  . Attends Archivist Meetings: Not on file  . Marital Status: Married   No Known Allergies Family History  Problem Relation Age of Onset  . Hypertension Mother   . Stroke Mother   . Colitis Father   . Hypertension Father   . Other Father        esophageal stricture  . Alcohol abuse Maternal Aunt   . Rectal cancer Neg Hx   . Stomach cancer Neg Hx   . Esophageal cancer Neg Hx   . Colon polyps Neg Hx   . Colon cancer Neg Hx      Current Outpatient Medications (Cardiovascular):  .  benazepril (LOTENSIN) 40 MG tablet, TAKE 1 TABLET BY MOUTH EVERY DAY .  rosuvastatin (CRESTOR) 5 MG tablet, Take 1 tablet (5 mg total) by mouth daily.   Current Outpatient Medications (Analgesics):  Marland Kitchen  Acetaminophen (TYLENOL PO), Take by mouth. Marland Kitchen  aspirin EC 81 MG tablet, Take 81  mg by mouth daily.   Current Outpatient Medications (Other):  .  Apoaequorin (PREVAGEN) 10 MG CAPS, Take 1 capsule by mouth every morning. .  Ascorbic Acid (VITAMIN C PO), Take by mouth. .  cholecalciferol (VITAMIN D3) 25 MCG (1000 UNIT) tablet, Take 1,000 Units by mouth daily. .  Coenzyme Q10 (COQ10 PO), Take by mouth. .  levETIRAcetam (KEPPRA) 500 MG tablet, Take 1 tablet (500 mg total) by mouth 2 (two) times daily. .  melatonin 5 MG TABS, Take 5 mg by mouth. .  Misc Natural Products (PROSTATE HEALTH PO), Take by mouth. .  Omega-3 Fatty Acids (FISH OIL) 1000 MG CPDR, Take 1 capsule by mouth every morning. Marland Kitchen  omeprazole (PRILOSEC) 20 MG capsule, Take 20 mg by mouth 4 (four) times daily as needed.  .  Sodium Sulfate-Mag Sulfate-KCl (SUTAB) [phone] MG TABS, Take 12 tablets by mouth as directed. MANUFACTURER CODES!! BIN: [id number] PCN: CN GROUP: [id number] MEMBER ID: [id number];JJK AS CASH;NO PRIOR AUTHORIZATION .  Turmeric (QC TUMERIC COMPLEX PO), Take by mouth.   Reviewed prior external information including notes and imaging from  primary care provider As well as notes that were available from care everywhere and other healthcare systems.  Past medical history, social, surgical and family history all reviewed in electronic medical record.  No pertanent information unless stated regarding to the chief complaint.   Review of Systems:  No headache, visual changes, nausea, vomiting, diarrhea, constipation, dizziness, abdominal pain, skin rash, fevers, chills, night sweats, weight loss, swollen lymph nodes, body aches, joint swelling, chest pain, shortness of breath, mood changes. POSITIVE muscle aches  Objective  Blood pressure 130/78, pulse 65, height 5\' 7"  (1.702 m), weight 161 lb (73 kg), SpO2 96 %.   General: No apparent distress alert and oriented x3 mood and affect normal, dressed appropriately.  HEENT: Pupils equal, extraocular movements intact  Respiratory: Patient's speak in  full sentences and does not appear short of breath  Cardiovascular: No lower extremity edema, non tender, no erythema  Neuro: Cranial nerves II through XII are intact, neurovascularly intact in all extremities with 2+ DTRs and 2+ pulses.  Gait normal with good balance and coordination.  MSK: Right knee exam shows the patient does have some swelling compared to the contralateral side.  Effusion noted.  Patient does have some mild instability but left knee brace on.  Not tested appropriately.  Fullness noted of the popliteal area.  Limited musculoskeletal ultrasound was performed and interpreted by Lyndal Pulley  Limited ultrasound of patient's knee shows a small effusion of the patellofemoral joint noted with narrowing there as well as the medial joint space.  Patient does have a reaccumulation of the Baker's cyst again.    Impression and Recommendations:     The above documentation has been reviewed and is accurate and complete Lyndal Pulley, DO       Note: This dictation was prepared with Dragon dictation along with smaller phrase technology. Any transcriptional errors that result from this process are unintentional.

## 2020-01-11 NOTE — Assessment & Plan Note (Signed)
Known arthritic changes of the knee with a Baker's cyst.  Patient on exam today does have reaccumulation.  Discussed the possibility of aspiration again the patient declined.  States that it is not hurting as bad.  We discussed waiting 10 weeks would be the most beneficial as well.  Increase activity as tolerated.  Continue to wear the stability brace and compression was suggested.  Follow-up with me again 4 to 6 weeks.

## 2020-01-23 ENCOUNTER — Encounter: Payer: Self-pay | Admitting: Neurology

## 2020-01-23 ENCOUNTER — Ambulatory Visit: Payer: Medicare HMO | Admitting: Neurology

## 2020-01-23 VITALS — BP 134/73 | HR 67 | Ht 67.0 in | Wt 160.8 lb

## 2020-01-23 DIAGNOSIS — R569 Unspecified convulsions: Secondary | ICD-10-CM

## 2020-01-23 MED ORDER — LEVETIRACETAM 500 MG PO TABS
500.0000 mg | ORAL_TABLET | Freq: Two times a day (BID) | ORAL | 1 refills | Status: DC
Start: 2020-01-23 — End: 2020-07-23

## 2020-01-23 NOTE — Patient Instructions (Signed)
I had a long discussion with the patient and his wife regarding his now second episode of unresponsiveness followed by confusion which likely represents seizure.  I agree with Keppra 500 mg twice daily which seems to be tolerating well without side effects.  He was given a 90-day supply upon his request.  I recommend checking EEG to look for any silent seizure activity.  Patient was advised to avoid seizure provoking triggers like sleep deprivation, irregular eating and sleeping habits and extremes of activity.  He was advised to be compliant with his seizure medications.  He was advised not to drive for 6 months as per [facility].  He will return for follow-up in the future in 6 months or call earlier if necessary.  Seizure, Adult A seizure is a sudden burst of abnormal electrical activity in the brain. Seizures usually last from 30 seconds to 2 minutes. They can cause many different symptoms. Usually, seizures are not harmful unless they last a long time. What are the causes? Common causes of this condition include:  Fever or infection.  Conditions that affect the brain, such as: ? A brain abnormality that you were born with. ? A brain or head injury. ? Bleeding in the brain. ? A tumor. ? Stroke. ? Brain disorders such as autism or cerebral palsy.  Low blood sugar.  Conditions that are passed from parent to child (are inherited).  Problems with substances, such as: ? Having a reaction to a drug or a medicine. ? Suddenly stopping the use of a substance (withdrawal). In some cases, the cause may not be known. A person who has repeated seizures over time without a clear cause has a condition called epilepsy. What increases the risk? You are more likely to get this condition if you have:  A family history of epilepsy.  Had a seizure in the past.  A brain disorder.  A history of head injury, lack of oxygen at birth, or strokes. What are the signs or symptoms? There are many  types of seizures. The symptoms vary depending on the type of seizure you have. Examples of symptoms during a seizure include:  Shaking (convulsions).  Stiffness in the body.  Passing out (losing consciousness).  Head nodding.  Staring.  Not responding to sound or touch.  Loss of bladder control and bowel control. Some people have symptoms right before and right after a seizure happens. Symptoms before a seizure may include:  Fear.  Worry (anxiety).  Feeling like you may vomit (nauseous).  Feeling like the room is spinning (vertigo).  Feeling like you saw or heard something before (dj vu).  Odd tastes or smells.  Changes in how you see. You may see flashing lights or spots. Symptoms after a seizure happens can include:  Confusion.  Sleepiness.  Headache.  Weakness on one side of the body. How is this treated? Most seizures will stop on their own in under 5 minutes. In these cases, no treatment is needed. Seizures that last longer than 5 minutes will usually need treatment. Treatment can include:  Medicines given through an IV tube.  Avoiding things that are known to cause your seizures. These can include medicines that you take for another condition.  Medicines to treat epilepsy.  Surgery to stop the seizures. This may be needed if medicines do not help. Follow these instructions at home: Medicines  Take over-the-counter and prescription medicines only as told by your doctor.  Do not eat or drink anything that may  keep your medicine from working, such as alcohol. Activity  Do not do any activities that would be dangerous if you had another seizure, like driving or swimming. Wait until your doctor says it is safe for you to do them.  If you live in the [place] ask your local DMV (department of motor vehicles) when you can drive.  Get plenty of rest. Teaching others Teach friends and family what to do when you have a seizure. They should:  Lay you on  the ground.  Protect your head and body.  Loosen any tight clothing around your neck.  Turn you on your side.  Not hold you down.  Not put anything into your mouth.  Know whether or not you need emergency care.  Stay with you until you are better.  General instructions  Contact your doctor each time you have a seizure.  Avoid anything that gives you seizures.  Keep a seizure diary. Write down: ? What you think caused each seizure. ? What you remember about each seizure.  Keep all follow-up visits as told by your doctor. This is important. Contact a doctor if:  You have another seizure.  You have seizures more often.  There is any change in what happens during your seizures.  You keep having seizures with treatment.  You have symptoms of being sick or having an infection. Get help right away if:  You have a seizure that: ? Lasts longer than 5 minutes. ? Is different than seizures you had before. ? Makes it harder to breathe. ? Happens after you hurt your head.  You have any of these symptoms after a seizure: ? Not being able to speak. ? Not being able to use a part of your body. ? Confusion. ? A bad headache.  You have two or more seizures in a row.  You do not wake up right after a seizure.  You get hurt during a seizure. These symptoms may be an emergency. Do not wait to see if the symptoms will go away. Get medical help right away. Call your local emergency services (911 in the [place] Do not drive yourself to the hospital. Summary  Seizures usually last from 30 seconds to 2 minutes. Usually, they are not harmful unless they last a long time.  Do not eat or drink anything that may keep your medicine from working, such as alcohol.  Teach friends and family what to do when you have a seizure.  Contact your doctor each time you have a seizure. This information is not intended to replace advice given to you by your health care provider. Make sure you  discuss any questions you have with your health care provider. Document Revised: 06/24/2018 Document Reviewed: 06/24/2018 Elsevier Patient Education  [place].

## 2020-01-23 NOTE — Progress Notes (Signed)
[facility] [address] [place]. [place] [address] [phone]       OFFICE FOLLOW UP VISIT NOTE  [name] [name] Date of Birth:  11-04-1940 Medical Record Number:  [id number]   Referring MD: Carolann Littler  Reason for Referral: Loss of consciousness episode  JIR:[id number] visit 10/03/2019 ; [name] [name] is a pleasant 79 year old Caucasian male seen today for initial office consultation visit.  Is accompanied by his wife.  History is obtained from him and review of referral notes and imaging results though actual films were not available at the time of this visit.  His past medical history of hypertension, hyperlipidemia, sinus node dysfunction status post pacemaker insertion who was visiting [place] when on 09/12/2019 he had a transient episode of confusion with brief loss of consciousness.  He states that he woke up on that day with an episode of diarrhea and went to the bathroom and then subsequently made himself a cup of coffee.  Last thing he remembers is sitting in a chair and drinking coffee.  Reportedly his sister got up and found him asleep on the chair and in awkward position.  She tried to arouse him but he did not wake up and sounded confused and his speech was garbled and did not make sense.  EMS were called who found him to be arousable but he did not have any focal deficits to raise concern for stroke.  He had one episode of vomiting.  By the time he reached the hospital he was slightly disoriented and confused but had no focal deficits.  His lab work was pretty unremarkable urine drug screen was negative.  Alcohol test was negative.  CT scan of the head showed generalized cortical volume loss with changes of small vessel disease and slight enlargement of the ventricles disproportionate to the degree of atrophy as per radiology report.  CT angiogram of brain and neck was recommended and the neurologist consultation but I do not have   copy of the report she is CT scan  of the abdomen pelvis showed right lower kidney pole mass suspicious for renal cell carcinoma.  Carotid ultrasound showed no significant extracranial stenosis.  EEG was not done.  Patient states he still does not remember all the details of the episode.  He has a pacemaker since 2010 which is MRI incompatible hence it was not done.  Patient states is felt fine since his come here has had no further episodes of loss of consciousness.  He denies any prior history of seizures, significant head injury with loss of consciousness strokes or TIAs.  He does have a long-term mild memory difficulties which are mostly short-term.  These are not progressive as per him and his wife.  He does have a family history of dementia however.  He denies symptoms suggestive of normal pressure hydrocephalus in the form of progressive gait and balance difficulties, bladder incontinence or progressive memory loss. Update 11/13/2019 : He returns for follow-up after last visit 6 weeks ago.  Is accompanied by his wife.  Patient states has not had any seizure-like episode however about a month ago he had a minor spell when he was sitting in the porch outside his house when his wife noticed him to be slightly confused and disoriented.  He had trouble remembering the names of one of the neighbors.  This lasted barely a few minutes and returned back to baseline.  He denies any accompanying headache, tongue bite, injury or incontinence.  The patient continues  to have short-term memory difficulties which are unchanged.  He often misplaces his phone.  He is not participating in any cognitively challenging activities.  Is tolerating aspirin well without bruising or bleeding.  He takes fish oil every day.  He has no new complaints.  He did have EEG done on 11/01/2019 which was normal. Update 01/23/2020 : He is seen today for urgent follow-up visit following recent ER visit to [facility] on 12/11/2019 for possibly unwitnessed seizure.  Patient's  wife woke up in the middle of the night with the patient unresponsive but having tonic activity of both upper extremities which were raised about his body.  He had drooling from his mouth.  He was not answering questions.  She called 911.  Patient was found to be gradually waking up and does not remember till he was in the ER.  He was thought to have an unwitnessed seizure with postictal state and started on Keppra.  Patient seems to be tolerating find a milligrams twice daily well without any side effects.  He has had no further episodes of unresponsiveness or seizure-like activity.  He has not been driving.  He has no family tree of seizures.  States his short-term memory difficulties are unchanged and are not getting worse.  He has no new complaints. ROS:   14 system review of systems is positive for loss of consciousness, unresponsiveness, seizure memory difficulties,  , confusion, disorientation and all other systems negative PMH:  Past Medical History:  Diagnosis Date  . GERD (gastroesophageal reflux disease)   . HYPERLIPIDEMIA 07/17/2008  . HYPERTENSION 07/17/2008  . Sinus node dysfunction ([place]) 01/27/2011   a. s/p STJ dual chamber PPM   . Sleep apnea    doesnt wear cpap    Social History:  Social History   Socioeconomic History  . Marital status: Married    Spouse name: Vickii Chafe  . Number of children: 1  . Years of education: Not on file  . Highest education level: Bachelor's degree (e.g., BA, AB, BS)  Occupational History  . Occupation: CO-OWNER    Employer:  [employer]  Tobacco Use  . Smoking status: Former Smoker    Packs/day: 1.00    Years: 20.00    Pack years: 20.00    Types: Cigarettes    Quit date: 08/27/1982    Years since quitting: 37.4  . Smokeless tobacco: Never Used  Vaping Use  . Vaping Use: Never used  Substance and Sexual Activity  . Alcohol use: Yes    Alcohol/week: 14.0 standard drinks    Types: 14 Standard drinks or equivalent per week    Comment:  beer x 2 per day with meals/ may slow down   . Drug use: Yes    Types: Marijuana    Comment: once per day  . Sexual activity: Not on file  Other Topics Concern  . Not on file  Social History Narrative   Lives with spouse   Right Handed      3-4 c caffeine    Social Determinants of Health   Financial Resource Strain: Low Risk   . Difficulty of Paying Living Expenses: Not hard at all  Food Insecurity: No Food Insecurity  . Worried About Charity fundraiser in the Last Year: Never true  . Ran Out of Food in the Last Year: Never true  Transportation Needs: No Transportation Needs  . Lack of Transportation (Medical): No  . Lack of Transportation (Non-Medical): No  Physical Activity: Sufficiently  Active  . Days of Exercise per Week: 7 days  . Minutes of Exercise per Session: 90 min  Stress: No Stress Concern Present  . Feeling of Stress : Only a little  Social Connections: Unknown  . Frequency of Communication with Friends and Family: More than three times a week  . Frequency of Social Gatherings with Friends and Family: Once a week  . Attends Religious Services: Not on file  . Active Member of Clubs or Organizations: Not on file  . Attends Archivist Meetings: Not on file  . Marital Status: Married  Human resources officer Violence:   . Fear of Current or Ex-Partner: Not on file  . Emotionally Abused: Not on file  . Physically Abused: Not on file  . Sexually Abused: Not on file    Medications:   Current Outpatient Medications on File Prior to Visit  Medication Sig Dispense Refill  . Ascorbic Acid (VITAMIN C PO) Take by mouth.    Marland Kitchen aspirin EC 81 MG tablet Take 81 mg by mouth daily.    . benazepril (LOTENSIN) 40 MG tablet TAKE 1 TABLET BY MOUTH EVERY DAY 90 tablet 2  . cholecalciferol (VITAMIN D3) 25 MCG (1000 UNIT) tablet Take 1,000 Units by mouth daily.    . Coenzyme Q10 (COQ10 PO) Take by mouth.    . Misc Natural Products (PROSTATE HEALTH PO) Take by mouth.    .  Omega-3 Fatty Acids (FISH OIL) 1000 MG CPDR Take 1 capsule by mouth every morning. 30 capsule 1  . rosuvastatin (CRESTOR) 5 MG tablet Take 1 tablet (5 mg total) by mouth daily. 90 tablet 3  . Sodium Sulfate-Mag Sulfate-KCl (SUTAB) [phone] MG TABS Take 12 tablets by mouth as directed. MANUFACTURER CODES!! BIN: [id number] PCN: CN GROUP: [id number] MEMBER ID: [id number];DGU AS CASH;NO PRIOR AUTHORIZATION 24 tablet 0  . Turmeric (QC TUMERIC COMPLEX PO) Take by mouth.    . Acetaminophen (TYLENOL PO) Take by mouth.    . Apoaequorin (PREVAGEN) 10 MG CAPS Take 1 capsule by mouth every morning. 30 capsule 1  . melatonin 5 MG TABS Take 5 mg by mouth.    Marland Kitchen omeprazole (PRILOSEC) 20 MG capsule Take 20 mg by mouth 4 (four) times daily as needed.      No current facility-administered medications on file prior to visit.    Allergies:  No Known Allergies  Physical Exam General: well developed, well nourished elderly Caucasian male, seated, in no evident distress Head: head normocephalic and atraumatic.   Neck: supple with no carotid or supraclavicular bruits Cardiovascular: regular rate and rhythm, no murmurs Musculoskeletal: no deformity.  Wearing a right knee brace Skin:  no rash/petichiae Vascular:  Normal pulses all extremities  Neurologic Exam Mental Status: Awake and fully alert. Oriented to place and time. Recent and remote memory intact. Attention span, concentration and fund of knowledge appropriate. Mood and affect appropriate.  Cranial Nerves: Fundoscopic exam not done. Pupils equal, briskly reactive to light. Extraocular movements full without nystagmus. Visual fields full to confrontation. Hearing slightly diminished bilaterally despite hearing aids.. Facial sensation intact. Face, tongue, palate moves normally and symmetrically.  Motor: Normal bulk and tone. Normal strength in all tested extremity muscles. Sensory.: intact to touch , pinprick , position and vibratory sensation.   Coordination: Rapid alternating movements normal in all extremities. Finger-to-nose and heel-to-shin performed accurately bilaterally. Gait and Station: Arises from chair without difficulty. Stance is normal.  He is wearing a right knee brace gait demonstrates normal stride length and  balance . Able to heel, toe and tandem walk without difficulty.  Reflexes: 1+ and symmetric. Toes downgoing.     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     PLAN: I had a long discussion with the patient and his wife regarding his now second episode of unresponsiveness followed by confusion which likely represents seizure.  I agree with Keppra 500 mg twice daily which seems to be tolerating well without side effects.  He was given a 90-day supply upon his request.  I recommend checking EEG to look for any silent seizure activity.  Patient was advised to avoid seizure provoking triggers like sleep deprivation, irregular eating and sleeping habits and extremes of activity.  He was advised to be compliant with his seizure medications.  He was advised not to drive for 6 months as per [facility].  He will return for follow-up in the future in 6 months or call earlier if necessary. Greater than 50% time during this 30 minute visit was spent on counseling and coordination of care about his seizures and answering questions.  Antony Contras, MD  [facility] [address] [place], [place] [address]  Phone [phone] Fax [phone] Note: This document was prepared with digital dictation and possible smart phrase technology. Any transcriptional errors that result from this process are  unintentional.

## 2020-02-05 ENCOUNTER — Ambulatory Visit (INDEPENDENT_AMBULATORY_CARE_PROVIDER_SITE_OTHER): Payer: Medicare HMO

## 2020-02-05 DIAGNOSIS — I495 Sick sinus syndrome: Secondary | ICD-10-CM

## 2020-02-07 ENCOUNTER — Other Ambulatory Visit: Payer: Medicare HMO

## 2020-02-08 ENCOUNTER — Ambulatory Visit: Payer: Medicare HMO | Admitting: Family Medicine

## 2020-02-08 LAB — CUP PACEART REMOTE DEVICE CHECK
Date Time Interrogation Session: 20211019123836
Implantable Lead Implant Date: 20111010
Implantable Lead Implant Date: 20111010
Implantable Lead Location: 753859
Implantable Lead Location: 753860
Implantable Pulse Generator Implant Date: 20111010
Pulse Gen Model: 2110
Pulse Gen Serial Number: 7156802

## 2020-02-09 ENCOUNTER — Encounter: Payer: Self-pay | Admitting: Family Medicine

## 2020-02-09 NOTE — Progress Notes (Signed)
Remote pacemaker transmission.   

## 2020-02-15 ENCOUNTER — Other Ambulatory Visit: Payer: Medicare HMO

## 2020-02-16 ENCOUNTER — Ambulatory Visit: Payer: Medicare HMO | Admitting: Family Medicine

## 2020-02-26 DIAGNOSIS — R69 Illness, unspecified: Secondary | ICD-10-CM | POA: Diagnosis not present

## 2020-02-28 NOTE — Progress Notes (Signed)
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  Patient on exam today does have reaccumulation.  Discussed the possibility of aspiration again the patient declined.  States that it is not hurting as bad.  We discussed waiting 10 weeks would be the most beneficial as well.  Increase activity as tolerated.  Continue to wear the stability brace and compression was suggested.  Follow-up with me again 4 to 6 weeks  Update 02/29/2020 [name] [name] is a 79 y.o. male coming in with complaint of right knee pain. Patient states that he is doing alright but is experiencing some ship pain. Starts at ankle and radiates up to the inside of the knee. States he is also having some nerve pain on the inner thigh.  Also having some right shoulder pain.  Patient brings in an MRI of the right shoulder.  This was from 2017.  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     Past Medical History:  Diagnosis Date  . GERD (gastroesophageal reflux disease)   . HYPERLIPIDEMIA 07/17/2008  . HYPERTENSION 07/17/2008  . Sinus node dysfunction ([place]) 01/27/2011   a. s/p STJ dual chamber PPM   . Sleep apnea    doesnt wear cpap   Past Surgical History:  Procedure Laterality Date  . COLONOSCOPY  2016  . PACEMAKER PLACEMENT     STJ dual chamber PPM implant 2011  . POLYPECTOMY     Social History   Socioeconomic History  . Marital status: Married    Spouse name: Vickii Chafe  . Number of children: 1  . Years of education: Not on file  . Highest education level: Bachelor's degree (e.g., BA, AB, BS)  Occupational History  . Occupation: CO-OWNER     Employer: [employer]  Tobacco Use  . Smoking status: Former Smoker    Packs/day: 1.00    Years: 20.00    Pack years: 20.00    Types: Cigarettes    Quit date: 08/27/1982    Years since quitting: 37.5  . Smokeless tobacco: Never Used  Vaping Use  . Vaping Use: Never used  Substance and Sexual Activity  . Alcohol use: Yes    Alcohol/week: 14.0 standard drinks    Types: 14 Standard drinks or equivalent per week    Comment: beer x 2 per day with meals/ may slow down   . Drug use: Yes    Types: Marijuana    Comment: once per day  . Sexual activity: Not on file  Other Topics Concern  . Not on file  Social History Narrative   Lives with spouse   Right Handed      3-4 c caffeine    Social Determinants of Health   Financial Resource Strain: Low Risk   . Difficulty of Paying Living Expenses: Not hard at all  Food Insecurity: No Food Insecurity  . Worried About Charity fundraiser in the Last Year: Never true  . Ran Out of Food in the Last Year: Never true  Transportation Needs: No Transportation Needs  . Lack of Transportation (Medical): No  . Lack of Transportation (Non-Medical):  No  Physical Activity: Sufficiently Active  . Days of Exercise per Week: 7 days  . Minutes of Exercise per Session: 90 min  Stress: No Stress Concern Present  . Feeling of Stress : Only a little  Social Connections: Unknown  . Frequency of Communication with Friends and Family: More than three times a week  . Frequency of Social Gatherings with Friends and Family: Once a week  . Attends Religious Services: Not on file  . Active Member of Clubs or Organizations: Not on file  . Attends Archivist Meetings: Not on file  . Marital Status: Married   No Known Allergies Family History  Problem Relation Age of Onset  . Hypertension Mother   . Stroke Mother   . Colitis Father   . Hypertension Father   . Other Father        esophageal stricture  . Alcohol abuse Maternal Aunt   .  Rectal cancer Neg Hx   . Stomach cancer Neg Hx   . Esophageal cancer Neg Hx   . Colon polyps Neg Hx   . Colon cancer Neg Hx      Current Outpatient Medications (Cardiovascular):  .  benazepril (LOTENSIN) 40 MG tablet, TAKE 1 TABLET BY MOUTH EVERY DAY .  rosuvastatin (CRESTOR) 5 MG tablet, Take 1 tablet (5 mg total) by mouth daily.   Current Outpatient Medications (Analgesics):  Marland Kitchen  Acetaminophen (TYLENOL PO), Take by mouth. Marland Kitchen  aspirin EC 81 MG tablet, Take 81 mg by mouth daily.   Current Outpatient Medications (Other):  .  Apoaequorin (PREVAGEN) 10 MG CAPS, Take 1 capsule by mouth every morning. .  Ascorbic Acid (VITAMIN C PO), Take by mouth. .  cholecalciferol (VITAMIN D3) 25 MCG (1000 UNIT) tablet, Take 1,000 Units by mouth daily. .  Coenzyme Q10 (COQ10 PO), Take by mouth. .  levETIRAcetam (KEPPRA) 500 MG tablet, Take 1 tablet (500 mg total) by mouth 2 (two) times daily. .  melatonin 5 MG TABS, Take 5 mg by mouth. .  Misc Natural Products (PROSTATE HEALTH PO), Take by mouth. .  Omega-3 Fatty Acids (FISH OIL) 1000 MG CPDR, Take 1 capsule by mouth every morning. Marland Kitchen  omeprazole (PRILOSEC) 20 MG capsule, Take 20 mg by mouth 4 (four) times daily as needed.  .  Sodium Sulfate-Mag Sulfate-KCl (SUTAB) [phone] MG TABS, Take 12 tablets by mouth as directed. MANUFACTURER CODES!! BIN: [id number] PCN: CN GROUP: [id number] MEMBER ID: [id number];BMW AS CASH;NO PRIOR AUTHORIZATION .  Turmeric (QC TUMERIC COMPLEX PO), Take by mouth.   Reviewed prior external information including notes and imaging from  primary care provider As well as notes that were available from care everywhere and other healthcare systems.  Past medical history, social, surgical and family history all reviewed in electronic medical record.  No pertanent information unless stated regarding to the chief complaint.   Review of Systems:  No headache, visual changes, nausea, vomiting, diarrhea, constipation, dizziness,  abdominal pain, skin rash, fevers, chills, night sweats, weight loss, swollen lymph nodes, body aches,  Objective  Blood pressure 110/60, pulse 62, height 5\' 7"  (1.702 m), weight 163 lb (73.9 kg), SpO2 98 %.   General: No apparent distress alert and oriented x3 mood and affect normal, dressed appropriately.  HEENT: Pupils equal, extraocular movements intact  Respiratory: Patient's speak in full sentences and does not appear short of breath  Cardiovascular: No lower extremity edema, non tender, no erythema  Knee: Right valgus deformity noted. Large thigh to calf ratio.  Tender to palpation over medial and PF joint line.  Lacks last 10 degrees of flexion secondary to the Baker's cyst noted instability with valgus force.  painful patellar compression. Patellar glide with moderate crepitus. Patellar and quadriceps tendons unremarkable. Hamstring and quadriceps strength is normal.  Right shoulder exam has some mild atrophy of the musculature.  Decreased range of motion in all planes.  Positive crepitus noted.  Positive speeds.  Pain over the bicep tendon anteriorly.   Impression and Recommendations:     The above documentation has been reviewed and is accurate and complete Lyndal Pulley, DO

## 2020-02-29 ENCOUNTER — Encounter: Payer: Self-pay | Admitting: Family Medicine

## 2020-02-29 ENCOUNTER — Ambulatory Visit (INDEPENDENT_AMBULATORY_CARE_PROVIDER_SITE_OTHER): Payer: Medicare HMO

## 2020-02-29 ENCOUNTER — Ambulatory Visit: Payer: Medicare HMO | Admitting: Family Medicine

## 2020-02-29 ENCOUNTER — Other Ambulatory Visit: Payer: Self-pay

## 2020-02-29 VITALS — BP 110/60 | HR 62 | Ht 67.0 in | Wt 163.0 lb

## 2020-02-29 DIAGNOSIS — M25511 Pain in right shoulder: Secondary | ICD-10-CM

## 2020-02-29 DIAGNOSIS — M12811 Other specific arthropathies, not elsewhere classified, right shoulder: Secondary | ICD-10-CM | POA: Diagnosis not present

## 2020-02-29 DIAGNOSIS — M1711 Unilateral primary osteoarthritis, right knee: Secondary | ICD-10-CM | POA: Diagnosis not present

## 2020-02-29 DIAGNOSIS — M75101 Unspecified rotator cuff tear or rupture of right shoulder, not specified as traumatic: Secondary | ICD-10-CM

## 2020-02-29 DIAGNOSIS — G8929 Other chronic pain: Secondary | ICD-10-CM | POA: Diagnosis not present

## 2020-02-29 NOTE — Patient Instructions (Addendum)
Good to see you  Bicep tendonitis with a lot of arthritis in the shoulder Lets get an xray of the right shoulder Keep hands within peripheral vision  Lets change the tightness son the knee and consider compression sleeve with maybe changes with a glue gun.  We will keep watching the baker cyst but call me if we need it drained See me again in 2 months

## 2020-02-29 NOTE — Assessment & Plan Note (Addendum)
Severe overall.  Discussed with patient about the bicep tendon.  Discussed home exercises.  Worsening symptoms the only thing we can do is consider injection.  Patient did have an MRI from 2017 showing the patient had severe osteoarthritic changes of the joint.  Patient has done relatively well for 4 years.  Likely will need replacement at some point.  Follow-up again in 2 months total time with patient 31 minutes with this as well as reviewing outside records on the date of service

## 2020-02-29 NOTE — Assessment & Plan Note (Signed)
Seems stable at this time..  Patient does have a Baker's cyst of the right leg.  We discussed again about aspiration which patient declined.  We will continue to monitor.  Patient will follow up again in 2 months

## 2020-03-04 DIAGNOSIS — D485 Neoplasm of uncertain behavior of skin: Secondary | ICD-10-CM | POA: Diagnosis not present

## 2020-03-04 DIAGNOSIS — D692 Other nonthrombocytopenic purpura: Secondary | ICD-10-CM | POA: Diagnosis not present

## 2020-03-04 DIAGNOSIS — Z85828 Personal history of other malignant neoplasm of skin: Secondary | ICD-10-CM | POA: Diagnosis not present

## 2020-03-04 DIAGNOSIS — L570 Actinic keratosis: Secondary | ICD-10-CM | POA: Diagnosis not present

## 2020-03-04 DIAGNOSIS — C44319 Basal cell carcinoma of skin of other parts of face: Secondary | ICD-10-CM | POA: Diagnosis not present

## 2020-03-04 DIAGNOSIS — D225 Melanocytic nevi of trunk: Secondary | ICD-10-CM | POA: Diagnosis not present

## 2020-03-04 DIAGNOSIS — L821 Other seborrheic keratosis: Secondary | ICD-10-CM | POA: Diagnosis not present

## 2020-03-04 DIAGNOSIS — D1801 Hemangioma of skin and subcutaneous tissue: Secondary | ICD-10-CM | POA: Diagnosis not present

## 2020-03-13 ENCOUNTER — Other Ambulatory Visit: Payer: Medicare HMO

## 2020-03-18 ENCOUNTER — Other Ambulatory Visit: Payer: Self-pay

## 2020-03-18 ENCOUNTER — Other Ambulatory Visit: Payer: Medicare HMO | Admitting: Neurology

## 2020-03-18 DIAGNOSIS — R569 Unspecified convulsions: Secondary | ICD-10-CM | POA: Diagnosis not present

## 2020-03-24 ENCOUNTER — Other Ambulatory Visit: Payer: Self-pay | Admitting: Family Medicine

## 2020-03-24 NOTE — Progress Notes (Signed)
Kindly inform the patient that EEG study was normal.  No seizure activity noted.

## 2020-04-03 ENCOUNTER — Encounter: Payer: Self-pay | Admitting: Family Medicine

## 2020-04-03 ENCOUNTER — Other Ambulatory Visit: Payer: Self-pay

## 2020-04-03 ENCOUNTER — Ambulatory Visit: Payer: Self-pay

## 2020-04-03 ENCOUNTER — Ambulatory Visit: Payer: Medicare HMO | Admitting: Family Medicine

## 2020-04-03 VITALS — BP 142/76 | HR 61 | Ht 67.0 in | Wt 166.0 lb

## 2020-04-03 DIAGNOSIS — M12811 Other specific arthropathies, not elsewhere classified, right shoulder: Secondary | ICD-10-CM | POA: Diagnosis not present

## 2020-04-03 DIAGNOSIS — M1711 Unilateral primary osteoarthritis, right knee: Secondary | ICD-10-CM

## 2020-04-03 DIAGNOSIS — M7121 Synovial cyst of popliteal space [Baker], right knee: Secondary | ICD-10-CM | POA: Diagnosis not present

## 2020-04-03 DIAGNOSIS — M75101 Unspecified rotator cuff tear or rupture of right shoulder, not specified as traumatic: Secondary | ICD-10-CM | POA: Diagnosis not present

## 2020-04-03 NOTE — Patient Instructions (Addendum)
Drained knee today Continue compression  Wear custom with activity Watch shoulder See me in 10-12 weeks

## 2020-04-03 NOTE — Assessment & Plan Note (Signed)
Severe overall.  Patient may need replacement at some point but feels like if he can tolerate it.  No other change in management today

## 2020-04-03 NOTE — Progress Notes (Signed)
[place] Phone: [phone] Subjective:   [name], am serving as a scribe for Dr. Hulan Saas. This visit occurred during the SARS-CoV-2 public health emergency.  Safety protocols were in place, including screening questions prior to the visit, additional usage of staff PPE, and extensive cleaning of exam room while observing appropriate contact time as indicated for disinfecting solutions.   I'm seeing this patient by the request  of:  [name], MD  CC: Knee pain and shoulder pain follow-up  [id number]   02/29/2020 Severe overall.  Discussed with patient about the bicep tendon.  Discussed home exercises.  Worsening symptoms the only thing we can do is consider injection.  Patient did have an MRI from 2017 showing the patient had severe osteoarthritic changes of the joint.  Patient has done relatively well for 4 years.  Likely will need replacement at some point.  Follow-up again in 2 months total time with patient 31 minutes with this as well as reviewing outside records on the date of service  Update 04/03/2020 VOSHON PETRO is a 79 y.o. male coming in with complaint of right shoulder pain. Pain in shoulder has not improved. Pain with flexion. Able to play pickleball.  Patient does not want to do anything else more.  He does have severe arthritic changes noted.  Continues to have pain over medial joint line in right knee. Has been wearing a brace which is helpful.  Patient states that it is more sensitive in the posterior aspect.  Has a known Baker's cyst and arthritic changes.  Has been wearing the brace regularly which has been helpful.     Past Medical History:  Diagnosis Date  . GERD (gastroesophageal reflux disease)   . HYPERLIPIDEMIA 07/17/2008  . HYPERTENSION 07/17/2008  . Sinus node dysfunction ([place]) 01/27/2011   a. s/p STJ dual chamber PPM   . Sleep apnea    doesnt wear cpap    Past Surgical History:  Procedure Laterality Date  . COLONOSCOPY  2016  . PACEMAKER PLACEMENT     STJ dual chamber PPM implant 2011  . POLYPECTOMY     Social History   Socioeconomic History  . Marital status: Married    Spouse name: Vickii Chafe  . Number of children: 1  . Years of education: Not on file  . Highest education level: Bachelor's degree (e.g., BA, AB, BS)  Occupational History  . Occupation: CO-OWNER    Employer: [employer]  Tobacco Use  . Smoking status: Former Smoker    Packs/day: 1.00    Years: 20.00    Pack years: 20.00    Types: Cigarettes    Quit date: 08/27/1982    Years since quitting: 37.6  . Smokeless tobacco: Never Used  Vaping Use  . Vaping Use: Never used  Substance and Sexual Activity  . Alcohol use: Yes    Alcohol/week: 14.0 standard drinks    Types: 14 Standard drinks or equivalent per week    Comment: beer x 2 per day with meals/ may slow down   . Drug use: Yes    Types: Marijuana    Comment: once per day  . Sexual activity: Not on file  Other Topics Concern  . Not on file  Social History Narrative   Lives with spouse   Right Handed      3-4 c caffeine    Social Determinants of Health   Financial Resource Strain: Low  Risk   . Difficulty of Paying Living Expenses: Not hard at all  Food Insecurity: No Food Insecurity  . Worried About Charity fundraiser in the Last Year: Never true  . Ran Out of Food in the Last Year: Never true  Transportation Needs: No Transportation Needs  . Lack of Transportation (Medical): No  . Lack of Transportation (Non-Medical): No  Physical Activity: Sufficiently Active  . Days of Exercise per Week: 7 days  . Minutes of Exercise per Session: 90 min  Stress: No Stress Concern Present  . Feeling of Stress : Only a little  Social Connections: Unknown  . Frequency of Communication with Friends and Family: More than three times a week  . Frequency of Social Gatherings with Friends and Family: Once a week   . Attends Religious Services: Not on file  . Active Member of Clubs or Organizations: Not on file  . Attends Archivist Meetings: Not on file  . Marital Status: Married   No Known Allergies Family History  Problem Relation Age of Onset  . Hypertension Mother   . Stroke Mother   . Colitis Father   . Hypertension Father   . Other Father        esophageal stricture  . Alcohol abuse Maternal Aunt   . Rectal cancer Neg Hx   . Stomach cancer Neg Hx   . Esophageal cancer Neg Hx   . Colon polyps Neg Hx   . Colon cancer Neg Hx      Current Outpatient Medications (Cardiovascular):  .  benazepril (LOTENSIN) 40 MG tablet, TAKE 1 TABLET BY MOUTH EVERY DAY .  rosuvastatin (CRESTOR) 5 MG tablet, TAKE 1/2 TABLET BY MOUTH AT BEDTIME.   Current Outpatient Medications (Analgesics):  Marland Kitchen  Acetaminophen (TYLENOL PO), Take by mouth. Marland Kitchen  aspirin EC 81 MG tablet, Take 81 mg by mouth daily.   Current Outpatient Medications (Other):  .  Apoaequorin (PREVAGEN) 10 MG CAPS, Take 1 capsule by mouth every morning. .  Ascorbic Acid (VITAMIN C PO), Take by mouth. .  cholecalciferol (VITAMIN D3) 25 MCG (1000 UNIT) tablet, Take 1,000 Units by mouth daily. .  Coenzyme Q10 (COQ10 PO), Take by mouth. .  levETIRAcetam (KEPPRA) 500 MG tablet, Take 1 tablet (500 mg total) by mouth 2 (two) times daily. .  melatonin 5 MG TABS, Take 5 mg by mouth. .  Misc Natural Products (PROSTATE HEALTH PO), Take by mouth. .  Omega-3 Fatty Acids (FISH OIL) 1000 MG CPDR, Take 1 capsule by mouth every morning. Marland Kitchen  omeprazole (PRILOSEC) 20 MG capsule, Take 20 mg by mouth 4 (four) times daily as needed.  .  Sodium Sulfate-Mag Sulfate-KCl (SUTAB) [phone] MG TABS, Take 12 tablets by mouth as directed. MANUFACTURER CODES!! BIN: [id number] PCN: CN GROUP: [id number] MEMBER ID: [id number];YDX AS CASH;NO PRIOR AUTHORIZATION .  Turmeric (QC TUMERIC COMPLEX PO), Take by mouth.   Reviewed prior external information including notes  and imaging from  primary care provider As well as notes that were available from care everywhere and other healthcare systems.  Past medical history, social, surgical and family history all reviewed in electronic medical record.  No pertanent information unless stated regarding to the chief complaint.   Review of Systems:  No headache, visual changes, nausea, vomiting, diarrhea, constipation, dizziness, abdominal pain, skin rash, fevers, chills, night sweats, weight loss, swollen lymph nodes, body aches, joint swelling, chest pain, shortness of breath, mood changes. POSITIVE muscle aches  Objective  Blood pressure Marland Kitchen)  142/76, pulse 61, height 5\' 7"  (1.702 m), weight 166 lb (75.3 kg), SpO2 98 %.   General: No apparent distress alert and oriented x3 mood and affect normal, dressed appropriately.  HEENT: Pupils equal, extraocular movements intact  Respiratory: Patient's speak in full sentences and does not appear short of breath  Cardiovascular: No lower extremity edema, non tender, no erythema  Right shoulder exam does have mild atrophy.  Patient is doing relatively well.  Nontender on exam  Right knee does have some instability with valgus and varus force.  Lacks less than 10 degrees of flexion.  Patient does have fullness noted in the popliteal area.  Procedure: Real-time Ultrasound Guided Injection of right knee Baker's cyst Device: GE Logiq Q7 Ultrasound guided injection is preferred based studies that show increased duration, increased effect, greater accuracy, decreased procedural pain, increased response rate, and decreased cost with ultrasound guided versus blind injection.  Verbal informed consent obtained.  Time-out conducted.  Noted no overlying erythema, induration, or other signs of local infection.  Skin prepped in a sterile fashion.  Local anesthesia: Topical Ethyl chloride.  With sterile technique and under real time ultrasound guidance: With a 22-gauge 2 inch needle  patient was injected with 4 cc of 0.5% Marcaine and aspirated 40 cc of straw-colored fluid then injected 1 cc of Kenalog 40 mg/dL. This was from a posterior approach.  Completed without difficulty  Pain immediately resolved suggesting accurate placement of the medication.  Advised to call if fevers/chills, erythema, induration, drainage, or persistent bleeding.  Impression: Technically successful ultrasound guided injection.    Impression and Recommendations:     The above documentation has been reviewed and is accurate and complete Lyndal Pulley, DO

## 2020-04-03 NOTE — Assessment & Plan Note (Signed)
Underlying arthritic changes noted.  We discussed with patient about continuing the brace that is seeming to give him more stability.  Wants to avoid any surgical intervention.  Replacement at some point follow-up again in 3 months

## 2020-04-03 NOTE — Assessment & Plan Note (Signed)
Patient had aspiration done today.  Tolerated the procedure well.  I am expecting the patient actually hopefully help more.  Discussed icing regimen.  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  Patient wants to avoid any type of surgical intervention as long as possible.  Patient will follow up with me again in 3 months otherwise.

## 2020-05-28 ENCOUNTER — Telehealth: Payer: Self-pay | Admitting: Family Medicine

## 2020-05-28 NOTE — Telephone Encounter (Signed)
Tried calling patient to schedule Medicare Annual Wellness Visit (AWV) either virtually or in office  No answer.   Last AWV 05/23/19  please schedule at anytime with [facility] Nurse Health Advisor 1 or 2   This should be a 45 minute visit. 

## 2020-06-10 ENCOUNTER — Ambulatory Visit (INDEPENDENT_AMBULATORY_CARE_PROVIDER_SITE_OTHER): Payer: Medicare HMO

## 2020-06-10 DIAGNOSIS — I495 Sick sinus syndrome: Secondary | ICD-10-CM | POA: Diagnosis not present

## 2020-06-10 LAB — CUP PACEART REMOTE DEVICE CHECK
Battery Remaining Longevity: 50 mo
Battery Remaining Percentage: 35 %
Battery Voltage: 2.83 V
Brady Statistic AP VP Percent: 1 %
Brady Statistic AP VS Percent: 2.5 %
Brady Statistic AS VP Percent: 1 %
Brady Statistic AS VS Percent: 97 %
Brady Statistic RA Percent Paced: 2.4 %
Brady Statistic RV Percent Paced: 1 %
Date Time Interrogation Session: 20220218154655
Implantable Lead Implant Date: 20111010
Implantable Lead Implant Date: 20111010
Implantable Lead Location: 753859
Implantable Lead Location: 753860
Implantable Pulse Generator Implant Date: 20111010
Lead Channel Impedance Value: 300 Ohm
Lead Channel Impedance Value: 310 Ohm
Lead Channel Pacing Threshold Amplitude: 0.625 V
Lead Channel Pacing Threshold Amplitude: 1.125 V
Lead Channel Pacing Threshold Pulse Width: 0.4 ms
Lead Channel Pacing Threshold Pulse Width: 0.4 ms
Lead Channel Sensing Intrinsic Amplitude: 2.5 mV
Lead Channel Sensing Intrinsic Amplitude: 5.7 mV
Lead Channel Setting Pacing Amplitude: 1.375
Lead Channel Setting Pacing Amplitude: 1.625
Lead Channel Setting Pacing Pulse Width: 0.4 ms
Lead Channel Setting Sensing Sensitivity: 2 mV
Pulse Gen Model: 2110
Pulse Gen Serial Number: 7156802

## 2020-06-14 NOTE — Progress Notes (Signed)
Remote pacemaker transmission.   

## 2020-06-26 ENCOUNTER — Ambulatory Visit: Payer: Medicare HMO | Admitting: Family Medicine

## 2020-07-02 NOTE — Progress Notes (Signed)
[place] [address] [place] Phone: [phone] Subjective:   I [name] [name] am serving as a Education administrator for Dr. Hulan Saas.  This visit occurred during the SARS-CoV-2 public health emergency.  Safety protocols were in place, including screening questions prior to the visit, additional usage of staff PPE, and extensive cleaning of exam room while observing appropriate contact time as indicated for disinfecting solutions.   I'm seeing this patient by the request  of:  [name], MD  CC: Shoulder pain and knee pain follow-up  [id number]   04/03/2020 Underlying arthritic changes noted.  We discussed with patient about continuing the brace that is seeming to give him more stability.  Wants to avoid any surgical intervention.  Replacement at some point follow-up again in 3 months  Severe overall.  Patient may need replacement at some point but feels like if he can tolerate it.  No other change in management today  Patient had aspiration done today.  Tolerated the procedure well.  I am expecting the patient actually hopefully help more.  Discussed icing regimen.  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  Patient wants to avoid any type of surgical intervention as long as possible.  Patient will follow up with me again in 3 months otherwise.  Update 07/03/2020 [name] [name] is a 80 y.o. male coming in with complaint of right shoulder and left knee pain. Patient states he got a massage last week and it helped the shoulder. The knee is doing better. Baker cyst has not bothered him. Using compression sleeve. Wants to know if his knee brace needs adjustments.      Past Medical History:  Diagnosis Date  . GERD (gastroesophageal reflux disease)   . HYPERLIPIDEMIA 07/17/2008  . HYPERTENSION 07/17/2008  . Sinus node dysfunction ([place]) 01/27/2011   a. s/p STJ dual chamber PPM   . Sleep apnea    doesnt wear  cpap   Past Surgical History:  Procedure Laterality Date  . COLONOSCOPY  2016  . PACEMAKER PLACEMENT     STJ dual chamber PPM implant 2011  . POLYPECTOMY     Social History   Socioeconomic History  . Marital status: Married    Spouse name: Vickii Chafe  . Number of children: 1  . Years of education: Not on file  . Highest education level: Bachelor's degree (e.g., BA, AB, BS)  Occupational History  . Occupation: CO-OWNER    Employer: [employer]  Tobacco Use  . Smoking status: Former Smoker    Packs/day: 1.00    Years: 20.00    Pack years: 20.00    Types: Cigarettes    Quit date: 08/27/1982    Years since quitting: 37.8  . Smokeless tobacco: Never Used  Vaping Use  . Vaping Use: Never used  Substance and Sexual Activity  . Alcohol use: Yes    Alcohol/week: 14.0 standard drinks    Types: 14 Standard drinks or equivalent per week    Comment: beer x 2 per day with meals/ may slow down   . Drug use: Yes    Types: Marijuana    Comment: once per day  . Sexual activity: Not on file  Other Topics Concern  . Not on file  Social History Narrative   Lives with spouse   Right Handed      3-4 c caffeine    Social Determinants of Health   Financial Resource Strain: Not  on file  Food Insecurity: Not on file  Transportation Needs: Not on file  Physical Activity: Not on file  Stress: Not on file  Social Connections: Not on file   No Known Allergies Family History  Problem Relation Age of Onset  . Hypertension Mother   . Stroke Mother   . Colitis Father   . Hypertension Father   . Other Father        esophageal stricture  . Alcohol abuse Maternal Aunt   . Rectal cancer Neg Hx   . Stomach cancer Neg Hx   . Esophageal cancer Neg Hx   . Colon polyps Neg Hx   . Colon cancer Neg Hx      Current Outpatient Medications (Cardiovascular):  .  benazepril (LOTENSIN) 40 MG tablet, TAKE 1 TABLET BY MOUTH EVERY DAY .  rosuvastatin (CRESTOR) 5 MG tablet, TAKE 1/2 TABLET BY MOUTH  AT BEDTIME.   Current Outpatient Medications (Analgesics):  Marland Kitchen  Acetaminophen (TYLENOL PO), Take by mouth. Marland Kitchen  aspirin EC 81 MG tablet, Take 81 mg by mouth daily.   Current Outpatient Medications (Other):  .  Apoaequorin (PREVAGEN) 10 MG CAPS, Take 1 capsule by mouth every morning. .  Ascorbic Acid (VITAMIN C PO), Take by mouth. .  cholecalciferol (VITAMIN D3) 25 MCG (1000 UNIT) tablet, Take 1,000 Units by mouth daily. .  Coenzyme Q10 (COQ10 PO), Take by mouth. .  levETIRAcetam (KEPPRA) 500 MG tablet, Take 1 tablet (500 mg total) by mouth 2 (two) times daily. .  melatonin 5 MG TABS, Take 5 mg by mouth. .  Misc Natural Products (PROSTATE HEALTH PO), Take by mouth. .  Omega-3 Fatty Acids (FISH OIL) 1000 MG CPDR, Take 1 capsule by mouth every morning. Marland Kitchen  omeprazole (PRILOSEC) 20 MG capsule, Take 20 mg by mouth 4 (four) times daily as needed.  .  Sodium Sulfate-Mag Sulfate-KCl (SUTAB) [phone] MG TABS, Take 12 tablets by mouth as directed. MANUFACTURER CODES!! BIN: [id number] PCN: CN GROUP: [id number] MEMBER ID: [id number];DGL AS CASH;NO PRIOR AUTHORIZATION .  Turmeric (QC TUMERIC COMPLEX PO), Take by mouth.   Reviewed prior external information including notes and imaging from  primary care provider As well as notes that were available from care everywhere and other healthcare systems.  Past medical history, social, surgical and family history all reviewed in electronic medical record.  No pertanent information unless stated regarding to the chief complaint.   Review of Systems:  No headache, visual changes, nausea, vomiting, diarrhea, constipation, dizziness, abdominal pain, skin rash, fevers, chills, night sweats, weight loss, swollen lymph nodes, body aches, joint swelling, chest pain, shortness of breath, mood changes. POSITIVE muscle aches mild  Objective  Blood pressure (!) 148/70, pulse 61, height 5\' 7"  (1.702 m), weight 163 lb (73.9 kg), SpO2 97 %.   General: No apparent distress  alert and oriented x3 mood and affect normal, dressed appropriately.  HEENT: Pupils equal, extraocular movements intact  Respiratory: Patient's speak in full sentences and does not appear short of breath  Cardiovascular: No lower extremity edema, non tender, no erythema  Gait normal with good balance and coordination.  MSK: Right shoulder still has some decreased range of motion in all planes.  Mild crepitus noted.  Good grip strength are noted.  Minimal discomfort in the neck with range of motion testing. Right knee still has some instability noted.  No significant swelling.  No enlargement in the popliteal area.  Lacks the last 2 degrees of extension.    Impression  and Recommendations:     The above documentation has been reviewed and is accurate and complete Lyndal Pulley, DO

## 2020-07-03 ENCOUNTER — Ambulatory Visit: Payer: Medicare HMO | Admitting: Family Medicine

## 2020-07-03 ENCOUNTER — Other Ambulatory Visit: Payer: Self-pay

## 2020-07-03 ENCOUNTER — Encounter: Payer: Self-pay | Admitting: Family Medicine

## 2020-07-03 VITALS — BP 148/70 | HR 61 | Ht 67.0 in | Wt 163.0 lb

## 2020-07-03 DIAGNOSIS — M1990 Unspecified osteoarthritis, unspecified site: Secondary | ICD-10-CM

## 2020-07-03 DIAGNOSIS — M7121 Synovial cyst of popliteal space [Baker], right knee: Secondary | ICD-10-CM

## 2020-07-03 DIAGNOSIS — M1711 Unilateral primary osteoarthritis, right knee: Secondary | ICD-10-CM

## 2020-07-03 DIAGNOSIS — M75101 Unspecified rotator cuff tear or rupture of right shoulder, not specified as traumatic: Secondary | ICD-10-CM

## 2020-07-03 DIAGNOSIS — M12811 Other specific arthropathies, not elsewhere classified, right shoulder: Secondary | ICD-10-CM

## 2020-07-03 NOTE — Assessment & Plan Note (Signed)
Known arthritic changes but continues to be able to be active.  Patient is working out on a regular basis.  Patient is having no significant radicular pain.  Not using anything for pain in the medication department regularly.  Can have injections if necessary but understands that he will need potential replacement at some point follow-up with me again in 3 months

## 2020-07-03 NOTE — Assessment & Plan Note (Signed)
Known arthritic changes of the knee.  Doing relatively well.  We discussed the patient with the knee brace.  This does give him more stability.  Is playing pickle ball regularly.  Encourage patient to continue to stay active.  Follow-up with me again in 3 months no true accumulation of the Baker's cyst.

## 2020-07-03 NOTE — Patient Instructions (Addendum)
See me again in 3 months.

## 2020-07-09 DIAGNOSIS — C44319 Basal cell carcinoma of skin of other parts of face: Secondary | ICD-10-CM | POA: Diagnosis not present

## 2020-07-17 ENCOUNTER — Telehealth: Payer: Self-pay | Admitting: Family Medicine

## 2020-07-17 NOTE — Telephone Encounter (Signed)
Tried calling patient to schedule Medicare Annual Wellness Visit (AWV) either virtually or in office  No answer.   Last AWV 05/23/19  please schedule at anytime with [facility] Nurse Health Advisor 1 or 2   This should be a 45 minute visit.

## 2020-07-22 ENCOUNTER — Other Ambulatory Visit: Payer: Self-pay | Admitting: Neurology

## 2020-07-23 ENCOUNTER — Ambulatory Visit: Payer: Medicare HMO | Admitting: Neurology

## 2020-07-23 ENCOUNTER — Encounter: Payer: Self-pay | Admitting: Neurology

## 2020-07-23 VITALS — BP 167/73 | HR 58 | Ht 67.0 in | Wt 165.4 lb

## 2020-07-23 DIAGNOSIS — R569 Unspecified convulsions: Secondary | ICD-10-CM

## 2020-07-23 MED ORDER — LEVETIRACETAM 500 MG PO TABS
500.0000 mg | ORAL_TABLET | Freq: Two times a day (BID) | ORAL | 3 refills | Status: DC
Start: 2020-07-23 — End: 2021-02-20

## 2020-07-23 NOTE — Progress Notes (Signed)
[facility] [address] [place]. [place] [address] [phone]       OFFICE FOLLOW UP VISIT NOTE  [name] Date of Birth:  11-04-1940 Medical Record Number:  [id number]   Referring MD: Carolann Littler  Reason for Referral: Loss of consciousness episode  JIR:[id number] visit 10/03/2019 ; [name] [name] is a pleasant 80 year old Caucasian male seen today for initial office consultation visit.  Is accompanied by his wife.  History is obtained from him and review of referral notes and imaging results though actual films were not available at the time of this visit.  His past medical history of hypertension, hyperlipidemia, sinus node dysfunction status post pacemaker insertion who was visiting [place] when on 09/12/2019 he had a transient episode of confusion with brief loss of consciousness.  He states that he woke up on that day with an episode of diarrhea and went to the bathroom and then subsequently made himself a cup of coffee.  Last thing he remembers is sitting in a chair and drinking coffee.  Reportedly his sister got up and found him asleep on the chair and in awkward position.  She tried to arouse him but he did not wake up and sounded confused and his speech was garbled and did not make sense.  EMS were called who found him to be arousable but he did not have any focal deficits to raise concern for stroke.  He had one episode of vomiting.  By the time he reached the hospital he was slightly disoriented and confused but had no focal deficits.  His lab work was pretty unremarkable urine drug screen was negative.  Alcohol test was negative.  CT scan of the head showed generalized cortical volume loss with changes of small vessel disease and slight enlargement of the ventricles disproportionate to the degree of atrophy as per radiology report.  CT angiogram of brain and neck was recommended and the neurologist consultation but I do not have   copy of the report she is CT scan  of the abdomen pelvis showed right lower kidney pole mass suspicious for renal cell carcinoma.  Carotid ultrasound showed no significant extracranial stenosis.  EEG was not done.  Patient states he still does not remember all the details of the episode.  He has a pacemaker since 2010 which is MRI incompatible hence it was not done.  Patient states is felt fine since his come here has had no further episodes of loss of consciousness.  He denies any prior history of seizures, significant head injury with loss of consciousness strokes or TIAs.  He does have a long-term mild memory difficulties which are mostly short-term.  These are not progressive as per him and his wife.  He does have a family history of dementia however.  He denies symptoms suggestive of normal pressure hydrocephalus in the form of progressive gait and balance difficulties, bladder incontinence or progressive memory loss. Update 11/13/2019 : He returns for follow-up after last visit 6 weeks ago.  Is accompanied by his wife.  Patient states has not had any seizure-like episode however about a month ago he had a minor spell when he was sitting in the porch outside his house when his wife noticed him to be slightly confused and disoriented.  He had trouble remembering the names of one of the neighbors.  This lasted barely a few minutes and returned back to baseline.  He denies any accompanying headache, tongue bite, injury or incontinence.  The patient continues  to have short-term memory difficulties which are unchanged.  He often misplaces his phone.  He is not participating in any cognitively challenging activities.  Is tolerating aspirin well without bruising or bleeding.  He takes fish oil every day.  He has no new complaints.  He did have EEG done on 11/01/2019 which was normal. Update 01/23/2020 : He is seen today for urgent follow-up visit following recent ER visit to [facility] on 12/11/2019 for possibly unwitnessed seizure.  Patient's  wife woke up in the middle of the night with the patient unresponsive but having tonic activity of both upper extremities which were raised about his body.  He had drooling from his mouth.  He was not answering questions.  She called 911.  Patient was found to be gradually waking up and does not remember till he was in the ER.  He was thought to have an unwitnessed seizure with postictal state and started on Keppra.  Patient seems to be tolerating find a milligrams twice daily well without any side effects.  He has had no further episodes of unresponsiveness or seizure-like activity.  He has not been driving.  He has no family tree of seizures.  States his short-term memory difficulties are unchanged and are not getting worse.  He has no new complaints. Update 07/23/2020 : He returns for follow-up after last visit 6 months ago.  Is accompanied by his wife.  Patient states is done well.  He has no recurrent seizures.  Is tolerating Keppra and the current dose of 500 mg twice daily well without any side effects.  He did have a follow-up EEG on 03/19/2020 which was normal.  He has started driving and has had no issues.  He has no new complaints today. ROS:   14 system review of systems is positive for loss of consciousness, unresponsiveness, seizure memory difficulties,  , confusion, disorientation and all other systems negative PMH:  Past Medical History:  Diagnosis Date  . GERD (gastroesophageal reflux disease)   . HYPERLIPIDEMIA 07/17/2008  . HYPERTENSION 07/17/2008  . Seizures ([place])   . Sinus node dysfunction ([place]) 01/27/2011   a. s/p STJ dual chamber PPM   . Sleep apnea    doesnt wear cpap    Social History:  Social History   Socioeconomic History  . Marital status: Married    Spouse name: Vickii Chafe  . Number of children: 1  . Years of education: Not on file  . Highest education level: Bachelor's degree (e.g., BA, AB, BS)  Occupational History  . Occupation: CO-OWNER    Employer: [employer]  Tobacco Use  . Smoking status: Former Smoker    Packs/day: 0.00    Quit date: 08/27/1982    Years since quitting: 37.9  . Smokeless tobacco: Never Used  Vaping Use  . Vaping Use: Never used  Substance and Sexual Activity  . Alcohol use: Yes    Alcohol/week: 14.0 standard drinks    Types: 14 Standard drinks or equivalent per week    Comment: beer x 2 per day with meals/ may slow down   . Drug use: Yes    Types: Marijuana    Comment: once per day  . Sexual activity: Not on file  Other Topics Concern  . Not on file  Social History Narrative   Lives with spouse   Right Handed   3-4 c caffeine    Social Determinants of Health   Financial Resource Strain: Not on file  Food Insecurity:  Not on file  Transportation Needs: Not on file  Physical Activity: Not on file  Stress: Not on file  Social Connections: Not on file  Intimate Partner Violence: Not on file    Medications:   Current Outpatient Medications on File Prior to Visit  Medication Sig Dispense Refill  . Acetaminophen (TYLENOL PO) Take by mouth.    . Ascorbic Acid (VITAMIN C PO) Take by mouth.    . benazepril (LOTENSIN) 40 MG tablet TAKE 1 TABLET BY MOUTH EVERY DAY 90 tablet 2  . cholecalciferol (VITAMIN D3) 25 MCG (1000 UNIT) tablet Take 1,000 Units by mouth daily.    . Coenzyme Q10 (COQ10 PO) Take by mouth.    . Misc Natural Products (PROSTATE HEALTH PO) Take by mouth.    . Omega-3 Fatty Acids (FISH OIL) 1000 MG CPDR Take 1 capsule by mouth every morning. 30 capsule 1  . rosuvastatin (CRESTOR) 5 MG tablet TAKE 1/2 TABLET BY MOUTH AT BEDTIME. 45 tablet 3  . Sodium Sulfate-Mag Sulfate-KCl (SUTAB) [phone] MG TABS Take 12 tablets by mouth as directed. MANUFACTURER CODES!! BIN: [id number] PCN: CN GROUP: [id number] MEMBER ID: [id number];OHY AS CASH;NO PRIOR AUTHORIZATION 24 tablet 0  . Turmeric (QC TUMERIC COMPLEX PO) Take by mouth.    . Apoaequorin (PREVAGEN) 10 MG CAPS Take 1 capsule by mouth every morning. 30  capsule 1  . aspirin EC 81 MG tablet Take 81 mg by mouth daily.    . melatonin 5 MG TABS Take 5 mg by mouth.    Marland Kitchen omeprazole (PRILOSEC) 20 MG capsule Take 20 mg by mouth 4 (four) times daily as needed.      No current facility-administered medications on file prior to visit.    Allergies:  No Known Allergies  Physical Exam General: well developed, well nourished elderly Caucasian male, seated, in no evident distress Head: head normocephalic and atraumatic.   Neck: supple with no carotid or supraclavicular bruits Cardiovascular: regular rate and rhythm, no murmurs Musculoskeletal: no deformity.  Wearing a right knee brace Skin:  no rash/petichiae Vascular:  Normal pulses all extremities  Neurologic Exam Mental Status: Awake and fully alert. Oriented to place and time. Recent and remote memory intact. Attention span, concentration and fund of knowledge appropriate. Mood and affect appropriate.  Cranial Nerves: Fundoscopic exam not done. Pupils equal, briskly reactive to light. Extraocular movements full without nystagmus. Visual fields full to confrontation. Hearing slightly diminished bilaterally despite hearing aids.. Facial sensation intact. Face, tongue, palate moves normally and symmetrically.  Motor: Normal bulk and tone. Normal strength in all tested extremity muscles. Sensory.: intact to touch , pinprick , position and vibratory sensation.  Coordination: Rapid alternating movements normal in all extremities. Finger-to-nose and heel-to-shin performed accurately bilaterally. Gait and Station: Arises from chair without difficulty. Stance is normal. gait demonstrates normal stride length and balance . Able to heel, toe and tandem walk without difficulty.  Reflexes: 1+ and symmetric. Toes downgoing.     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     PLAN: I had a long discussion with the patient and his wife regarding his episodes of seizures and discussed the results of EEG and answered questions.  He seems to be doing well on the current dose of Keppra 500 mg  twice daily which we will continue and gave him a refill for a year.  I asked him to avoid seizure provoking stimuli like medication noncompliance, missing doses, sleep deprivation, extremes of activity and dieting.  He will return for follow-up in the future in a year or call earlier if necessary.Greater than 50% time during this 30 minute visit was spent on counseling and coordination of care about his seizures and answering questions.  Antony Contras, MD  [facility] [address] [place], [place] [address]  Phone [phone] Fax [phone] Note: This document was prepared with digital dictation and possible smart phrase technology. Any transcriptional errors that result from this process are unintentional.

## 2020-07-23 NOTE — Patient Instructions (Signed)
I had a long discussion with the patient and his wife regarding his episodes of seizures and discussed the results of EEG and answered questions.  He seems to be doing well on the current dose of Keppra 500 mg twice daily which we will continue and gave him a refill for a year.  I asked him to avoid seizure provoking stimuli like medication noncompliance, missing doses, sleep deprivation, extremes of activity and dieting.  He will return for follow-up in the future in a year or call earlier if necessary

## 2020-07-24 ENCOUNTER — Other Ambulatory Visit: Payer: Self-pay

## 2020-07-24 ENCOUNTER — Ambulatory Visit (AMBULATORY_SURGERY_CENTER): Payer: Self-pay

## 2020-07-24 VITALS — Ht 67.0 in | Wt 165.0 lb

## 2020-07-24 DIAGNOSIS — Z860100 Personal history of colon polyps, unspecified: Secondary | ICD-10-CM

## 2020-07-24 NOTE — Progress Notes (Signed)
Denies allergies to eggs or soy products. Denies complication of anesthesia or sedation. Denies use of weight loss medication. Denies use of O2.   Emmi instructions given for colonoscopy.  Sutab sample given to patient.

## 2020-08-02 DIAGNOSIS — N529 Male erectile dysfunction, unspecified: Secondary | ICD-10-CM | POA: Diagnosis not present

## 2020-08-02 DIAGNOSIS — Z85828 Personal history of other malignant neoplasm of skin: Secondary | ICD-10-CM | POA: Diagnosis not present

## 2020-08-02 DIAGNOSIS — E785 Hyperlipidemia, unspecified: Secondary | ICD-10-CM | POA: Diagnosis not present

## 2020-08-02 DIAGNOSIS — I10 Essential (primary) hypertension: Secondary | ICD-10-CM | POA: Diagnosis not present

## 2020-08-02 DIAGNOSIS — Z823 Family history of stroke: Secondary | ICD-10-CM | POA: Diagnosis not present

## 2020-08-02 DIAGNOSIS — Z809 Family history of malignant neoplasm, unspecified: Secondary | ICD-10-CM | POA: Diagnosis not present

## 2020-08-02 DIAGNOSIS — I495 Sick sinus syndrome: Secondary | ICD-10-CM | POA: Diagnosis not present

## 2020-08-02 DIAGNOSIS — Z8249 Family history of ischemic heart disease and other diseases of the circulatory system: Secondary | ICD-10-CM | POA: Diagnosis not present

## 2020-08-02 DIAGNOSIS — M1990 Unspecified osteoarthritis, unspecified site: Secondary | ICD-10-CM | POA: Diagnosis not present

## 2020-08-02 DIAGNOSIS — Z008 Encounter for other general examination: Secondary | ICD-10-CM | POA: Diagnosis not present

## 2020-08-02 DIAGNOSIS — G40909 Epilepsy, unspecified, not intractable, without status epilepticus: Secondary | ICD-10-CM | POA: Diagnosis not present

## 2020-08-05 ENCOUNTER — Encounter: Payer: Self-pay | Admitting: Family Medicine

## 2020-08-05 ENCOUNTER — Encounter: Payer: Self-pay | Admitting: Internal Medicine

## 2020-08-07 ENCOUNTER — Other Ambulatory Visit: Payer: Self-pay

## 2020-08-07 ENCOUNTER — Encounter: Payer: Self-pay | Admitting: Internal Medicine

## 2020-08-07 ENCOUNTER — Other Ambulatory Visit: Payer: Self-pay | Admitting: Internal Medicine

## 2020-08-07 ENCOUNTER — Ambulatory Visit (AMBULATORY_SURGERY_CENTER): Payer: Medicare HMO | Admitting: Internal Medicine

## 2020-08-07 VITALS — BP 117/65 | HR 66 | Temp 97.5°F | Resp 10 | Ht 66.0 in | Wt 160.0 lb

## 2020-08-07 DIAGNOSIS — Z860100 Personal history of colon polyps, unspecified: Secondary | ICD-10-CM

## 2020-08-07 DIAGNOSIS — Z1211 Encounter for screening for malignant neoplasm of colon: Secondary | ICD-10-CM | POA: Diagnosis not present

## 2020-08-07 DIAGNOSIS — D125 Benign neoplasm of sigmoid colon: Secondary | ICD-10-CM

## 2020-08-07 DIAGNOSIS — D122 Benign neoplasm of ascending colon: Secondary | ICD-10-CM

## 2020-08-07 HISTORY — PX: COLONOSCOPY WITH PROPOFOL: SHX5780

## 2020-08-07 MED ORDER — SODIUM CHLORIDE 0.9 % IV SOLN: 500.0000 mL | Freq: Once | INTRAVENOUS | Status: DC

## 2020-08-07 NOTE — Progress Notes (Signed)
To PACU, VSS. Report to [email or web address] 

## 2020-08-07 NOTE — Op Note (Signed)
[facility] Patient Name: [name] [name] Procedure Date: 08/07/2020 2:01 PM MRN: [id number] Endoscopist: Jerene Bears , MD Age: 80 Referring MD:  Date of Birth: [birth year] Gender: Male Account #: [id number] Procedure:                Colonoscopy Indications:              High risk colon cancer surveillance: Personal                            history of non-advanced adenoma, Last colonoscopy:                            June 2016 Medicines:                Monitored Anesthesia Care Procedure:                Pre-Anesthesia Assessment:                           - Prior to the procedure, a History and Physical                            was performed, and patient medications and                            allergies were reviewed. The patient's tolerance of                            previous anesthesia was also reviewed. The risks                            and benefits of the procedure and the sedation                            options and risks were discussed with the patient.                            All questions were answered, and informed consent                            was obtained. Prior Anticoagulants: The patient has                            taken no previous anticoagulant or antiplatelet                            agents. ASA Grade Assessment: III - A patient with                            severe systemic disease. After reviewing the risks                            and benefits, the patient was deemed in  satisfactory condition to undergo the procedure.                           After obtaining informed consent, the colonoscope                            was passed under direct vision. Throughout the                            procedure, the patient's blood pressure, pulse, and                            oxygen saturations were monitored continuously. The                            Colonoscope was introduced through the anus and                             advanced to the cecum, identified by appendiceal                            orifice and ileocecal valve. The colonoscopy was                            performed without difficulty. The patient tolerated                            the procedure well. The quality of the bowel                            preparation was good. The ileocecal valve,                            appendiceal orifice, and rectum were photographed. Scope In: 2:05:16 PM Scope Out: 2:21:18 PM Scope Withdrawal Time: 0 hours 11 minutes 22 seconds  Total Procedure Duration: 0 hours 16 minutes 2 seconds  Findings:                 The digital rectal exam was normal.                           A 5 mm polyp was found in the ascending colon. The                            polyp was sessile. The polyp was removed with a                            cold snare. Resection and retrieval were complete.                           A 4 mm polyp was found in the sigmoid colon. The                            polyp was  sessile. The polyp was removed with a                            cold snare. Resection and retrieval were complete.                           Multiple small-mouthed diverticula were found in                            the sigmoid colon.                           Internal hemorrhoids were found during                            retroflexion. The hemorrhoids were small. Complications:            No immediate complications. Estimated Blood Loss:     Estimated blood loss was minimal. Impression:               - One 5 mm polyp in the ascending colon, removed                            with a cold snare. Resected and retrieved.                           - One 4 mm polyp in the sigmoid colon, removed with                            a cold snare. Resected and retrieved.                           - Diverticulosis in the sigmoid colon.                           - Internal hemorrhoids. Recommendation:           -  Patient has a contact number available for                            emergencies. The signs and symptoms of potential                            delayed complications were discussed with the                            patient. Return to normal activities tomorrow.                            Written discharge instructions were provided to the                            patient.                           - Resume previous diet.                           -  Continue present medications.                           - Await pathology results.                           - No repeat colonoscopy due to lack of high risk                            polyps and age at next surveillance interval (>80                            yrs). Jerene Bears, MD 08/07/2020 2:25:09 PM This report has been signed electronically.

## 2020-08-07 NOTE — Patient Instructions (Signed)
Handout given:  Polyps, Diverticulosis, Hemorrhoids Resume previous diet Continue current medications Await pathology results YOU HAD AN ENDOSCOPIC PROCEDURE TODAY AT [facility]:   Refer to the procedure report that was given to you for any specific questions about what was found during the examination.  If the procedure report does not answer your questions, please call your gastroenterologist to clarify.  If you requested that your care partner not be given the details of your procedure findings, then the procedure report has been included in a sealed envelope for you to review at your convenience later.  YOU SHOULD EXPECT: Some feelings of bloating in the abdomen. Passage of more gas than usual.  Walking can help get rid of the air that was put into your GI tract during the procedure and reduce the bloating. If you had a lower endoscopy (such as a colonoscopy or flexible sigmoidoscopy) you may notice spotting of blood in your stool or on the toilet paper. If you underwent a bowel prep for your procedure, you may not have a normal bowel movement for a few days.  Please Note:  You might notice some irritation and congestion in your nose or some drainage.  This is from the oxygen used during your procedure.  There is no need for concern and it should clear up in a day or so.  SYMPTOMS TO REPORT IMMEDIATELY:   Following lower endoscopy (colonoscopy or flexible sigmoidoscopy):  Excessive amounts of blood in the stool  Significant tenderness or worsening of abdominal pains  Swelling of the abdomen that is new, acute  Fever of 100F or higher  For urgent or emergent issues, a gastroenterologist can be reached at any hour by calling [phone]. Do not use MyChart messaging for urgent concerns.    DIET:  We do recommend a small meal at first, but then you may proceed to your regular diet.  Drink plenty of fluids but you should avoid alcoholic beverages for 24  hours.  ACTIVITY:  You should plan to take it easy for the rest of today and you should NOT DRIVE or use heavy machinery until tomorrow (because of the sedation medicines used during the test).    FOLLOW UP: Our staff will call the number listed on your records 48-72 hours following your procedure to check on you and address any questions or concerns that you may have regarding the information given to you following your procedure. If we do not reach you, we will leave a message.  We will attempt to reach you two times.  During this call, we will ask if you have developed any symptoms of COVID 19. If you develop any symptoms (ie: fever, flu-like symptoms, shortness of breath, cough etc.) before then, please call [phone].  If you test positive for Covid 19 in the 2 weeks post procedure, please call and report this information to [place].    If any biopsies were taken you will be contacted by phone or by letter within the next 1-3 weeks.  Please call us at [phone] if you have not heard about the biopsies in 3 weeks.    SIGNATURES/CONFIDENTIALITY: You and/or your care partner have signed paperwork which will be entered into your electronic medical record.  These signatures attest to the fact that that the information above on your After Visit Summary has been reviewed and is understood.  Full responsibility of the confidentiality of this discharge information lies with you and/or your care-partner.YOU HAD AN ENDOSCOPIC PROCEDURE  TODAY AT [facility]:   Refer to the procedure report that was given to you for any specific questions about what was found during the examination.  If the procedure report does not answer your questions, please call your gastroenterologist to clarify.  If you requested that your care partner not be given the details of your procedure findings, then the procedure report has been included in a sealed envelope for you to review at your convenience later.  YOU  SHOULD EXPECT: Some feelings of bloating in the abdomen. Passage of more gas than usual.  Walking can help get rid of the air that was put into your GI tract during the procedure and reduce the bloating. If you had a lower endoscopy (such as a colonoscopy or flexible sigmoidoscopy) you may notice spotting of blood in your stool or on the toilet paper. If you underwent a bowel prep for your procedure, you may not have a normal bowel movement for a few days.  Please Note:  You might notice some irritation and congestion in your nose or some drainage.  This is from the oxygen used during your procedure.  There is no need for concern and it should clear up in a day or so.  SYMPTOMS TO REPORT IMMEDIATELY:   Following lower endoscopy (colonoscopy or flexible sigmoidoscopy):  Excessive amounts of blood in the stool  Significant tenderness or worsening of abdominal pains  Swelling of the abdomen that is new, acute  Fever of 100F or higher  For urgent or emergent issues, a gastroenterologist can be reached at any hour by calling [phone]. Do not use MyChart messaging for urgent concerns.   DIET:  We do recommend a small meal at first, but then you may proceed to your regular diet.  Drink plenty of fluids but you should avoid alcoholic beverages for 24 hours.  ACTIVITY:  You should plan to take it easy for the rest of today and you should NOT DRIVE or use heavy machinery until tomorrow (because of the sedation medicines used during the test).    FOLLOW UP: Our staff will call the number listed on your records 48-72 hours following your procedure to check on you and address any questions or concerns that you may have regarding the information given to you following your procedure. If we do not reach you, we will leave a message.  We will attempt to reach you two times.  During this call, we will ask if you have developed any symptoms of COVID 19. If you develop any symptoms (ie: fever, flu-like  symptoms, shortness of breath, cough etc.) before then, please call [phone].  If you test positive for Covid 19 in the 2 weeks post procedure, please call and report this information to [place].    If any biopsies were taken you will be contacted by phone or by letter within the next 1-3 weeks.  Please call us at [phone] if you have not heard about the biopsies in 3 weeks.    SIGNATURES/CONFIDENTIALITY: You and/or your care partner have signed paperwork which will be entered into your electronic medical record.  These signatures attest to the fact that that the information above on your After Visit Summary has been reviewed and is understood.  Full responsibility of the confidentiality of this discharge information lies with you and/or your care-partner.

## 2020-08-07 NOTE — Progress Notes (Signed)
Called to room to assist during endoscopic procedure.  Patient ID and intended procedure confirmed with present staff. Received instructions for my participation in the procedure from the performing physician.  

## 2020-08-07 NOTE — Progress Notes (Signed)
C.W. vital signs. 

## 2020-08-07 NOTE — Progress Notes (Signed)
Pt's states no medical or surgical changes since previsit or office visit. 

## 2020-08-09 ENCOUNTER — Telehealth: Payer: Self-pay | Admitting: *Deleted

## 2020-08-09 ENCOUNTER — Telehealth: Payer: Self-pay

## 2020-08-09 NOTE — Telephone Encounter (Signed)
  Follow up Call-  Call back number 08/07/2020  Post procedure Call Back phone  # [phone]  Permission to leave phone message Yes  Some recent data might be hidden     Patient questions:  Message left to call us if necessary.

## 2020-08-09 NOTE — Telephone Encounter (Signed)
LVM

## 2020-08-09 NOTE — Telephone Encounter (Signed)
Patient returned call/ States he is doing fine  

## 2020-08-19 ENCOUNTER — Encounter: Payer: Self-pay | Admitting: Internal Medicine

## 2020-08-24 DIAGNOSIS — Z20822 Contact with and (suspected) exposure to covid-19: Secondary | ICD-10-CM | POA: Diagnosis not present

## 2020-08-26 ENCOUNTER — Encounter: Payer: Self-pay | Admitting: Family Medicine

## 2020-08-28 ENCOUNTER — Telehealth (INDEPENDENT_AMBULATORY_CARE_PROVIDER_SITE_OTHER): Payer: Medicare HMO | Admitting: Family Medicine

## 2020-08-28 DIAGNOSIS — U071 COVID-19: Secondary | ICD-10-CM

## 2020-08-28 NOTE — Progress Notes (Signed)
Patient ID: [name] [name], male   DOB: [birth year], 80 y.o.   MRN: [id number]  This visit type was conducted due to national recommendations for restrictions regarding the COVID-19 pandemic in an effort to limit this patient's exposure and mitigate transmission in our community.   Virtual Visit via Video Note  I connected with [name] [name] on 08/28/20 at  8:00 AM EDT by a video enabled telemedicine application and verified that I am speaking with the correct person using two identifiers.  Location patient: home Location provider:work or home office Persons participating in the virtual visit: patient, provider  I discussed the limitations of evaluation and management by telemedicine and the availability of in person appointments. The patient expressed understanding and agreed to proceed.   HPI:  [name] [name] had onset around last Monday of some mild nasal congestion.  He thought this was just allergies.  Did not feel particularly ill.  At the urging of his wife he did home test last Friday which came back negative.  He subsequently tested Saturday which was positive and went to local pharmacy and had PCR test which came back Monday confirming positive.  He feels well at this time.  He has essentially no cough.  No fever.  No body aches.  Minimal nasal congestion.  He has had Moderna vaccine as well as 2 boosters including most recent booster April 27.  He has no dyspnea.  No nausea or vomiting.  No diarrhea.  Does have history of hypertension and history of sinus node dysfunction but otherwise fairly healthy.  He does have upcoming high school reunion this weekend and that was one of the major concerns with him calling.  It looks like he will be past day 10 at the time of his reunion this weekend   ROS: See pertinent positives and negatives per HPI.  Past Medical History:  Diagnosis Date  . Allergy   . Arthritis   . GERD (gastroesophageal reflux disease)   . HYPERLIPIDEMIA 07/17/2008  .  HYPERTENSION 07/17/2008  . Seizures ([place])    12/11/2019  . Sinus node dysfunction ([place]) 01/27/2011   a. s/p STJ dual chamber PPM   . Sleep apnea    doesnt wear cpap    Past Surgical History:  Procedure Laterality Date  . COLONOSCOPY  2016  . PACEMAKER PLACEMENT     STJ dual chamber PPM implant 2011  . POLYPECTOMY    . SKIN CANCER EXCISION      Family History  Problem Relation Age of Onset  . Hypertension Mother   . Stroke Mother   . Colitis Father   . Hypertension Father   . Other Father        esophageal stricture  . Liver disease Father   . Alcohol abuse Maternal Aunt   . Rectal cancer Neg Hx   . Stomach cancer Neg Hx   . Esophageal cancer Neg Hx   . Colon polyps Neg Hx   . Colon cancer Neg Hx     SOCIAL HX: Non-smoker   Current Outpatient Medications:  .  Acetaminophen (TYLENOL PO), Take by mouth., Disp: , Rfl:  .  Ascorbic Acid (VITAMIN C PO), Take by mouth., Disp: , Rfl:  .  benazepril (LOTENSIN) 40 MG tablet, TAKE 1 TABLET BY MOUTH EVERY DAY, Disp: 90 tablet, Rfl: 2 .  cholecalciferol (VITAMIN D3) 25 MCG (1000 UNIT) tablet, Take 1,000 Units by mouth daily., Disp: , Rfl:  .  Coenzyme Q10 (COQ10 PO), Take by mouth.,  Disp: , Rfl:  .  levETIRAcetam (KEPPRA) 500 MG tablet, Take 1 tablet (500 mg total) by mouth 2 (two) times daily., Disp: 180 tablet, Rfl: 3 .  Misc Natural Products (PROSTATE HEALTH PO), Take by mouth., Disp: , Rfl:  .  Omega-3 Fatty Acids (FISH OIL) 1000 MG CPDR, Take 1 capsule by mouth every morning., Disp: 30 capsule, Rfl: 1 .  rosuvastatin (CRESTOR) 5 MG tablet, TAKE 1/2 TABLET BY MOUTH AT BEDTIME., Disp: 45 tablet, Rfl: 3 .  Turmeric (QC TUMERIC COMPLEX PO), Take by mouth., Disp: , Rfl:   EXAM:  VITALS per patient if applicable:  GENERAL: alert, oriented, appears well and in no acute distress  HEENT: atraumatic, conjunttiva clear, no obvious abnormalities on inspection of external nose and ears  NECK: normal movements of the head and  neck  LUNGS: on inspection no signs of respiratory distress, breathing rate appears normal, no obvious gross SOB, gasping or wheezing  CV: no obvious cyanosis  MS: moves all visible extremities without noticeable abnormality  PSYCH/NEURO: pleasant and cooperative, no obvious depression or anxiety, speech and thought processing grossly intact  ASSESSMENT AND PLAN:  Discussed the following assessment and plan:  COVID-19-patient has very mild symptoms of minimal nasal congestion.  No fever or dyspnea or other concerns. He is on approximately day 9 currently. -We discussed strict isolation for 5 days with 5 days of additional mask use -We did discuss possible treatment options including infusion versus medication this point his symptoms are extremely mild and he wishes to observe which seems reasonable.  Follow-up promptly for any dyspnea or other changes symptoms     I discussed the assessment and treatment plan with the patient. The patient was provided an opportunity to ask questions and all were answered. The patient agreed with the plan and demonstrated an understanding of the instructions.   The patient was advised to call back or seek an in-person evaluation if the symptoms worsen or if the condition fails to improve as anticipated.     Carolann Littler, MD

## 2020-09-02 ENCOUNTER — Encounter: Payer: Self-pay | Admitting: Family Medicine

## 2020-09-09 ENCOUNTER — Ambulatory Visit (INDEPENDENT_AMBULATORY_CARE_PROVIDER_SITE_OTHER): Payer: Medicare HMO

## 2020-09-09 DIAGNOSIS — D485 Neoplasm of uncertain behavior of skin: Secondary | ICD-10-CM | POA: Diagnosis not present

## 2020-09-09 DIAGNOSIS — L570 Actinic keratosis: Secondary | ICD-10-CM | POA: Diagnosis not present

## 2020-09-09 DIAGNOSIS — D225 Melanocytic nevi of trunk: Secondary | ICD-10-CM | POA: Diagnosis not present

## 2020-09-09 DIAGNOSIS — I495 Sick sinus syndrome: Secondary | ICD-10-CM

## 2020-09-09 DIAGNOSIS — L821 Other seborrheic keratosis: Secondary | ICD-10-CM | POA: Diagnosis not present

## 2020-09-09 DIAGNOSIS — Z85828 Personal history of other malignant neoplasm of skin: Secondary | ICD-10-CM | POA: Diagnosis not present

## 2020-09-09 DIAGNOSIS — C44712 Basal cell carcinoma of skin of right lower limb, including hip: Secondary | ICD-10-CM | POA: Diagnosis not present

## 2020-09-11 LAB — CUP PACEART REMOTE DEVICE CHECK
Battery Remaining Longevity: 43 mo
Battery Remaining Percentage: 29 %
Battery Voltage: 2.81 V
Brady Statistic AP VP Percent: 1 %
Brady Statistic AP VS Percent: 2.6 %
Brady Statistic AS VP Percent: 1 %
Brady Statistic AS VS Percent: 97 %
Brady Statistic RA Percent Paced: 2.4 %
Brady Statistic RV Percent Paced: 1 %
Date Time Interrogation Session: 20220524100957
Implantable Lead Implant Date: 20111010
Implantable Lead Implant Date: 20111010
Implantable Lead Location: 753859
Implantable Lead Location: 753860
Implantable Pulse Generator Implant Date: 20111010
Lead Channel Impedance Value: 290 Ohm
Lead Channel Impedance Value: 300 Ohm
Lead Channel Pacing Threshold Amplitude: 0.625 V
Lead Channel Pacing Threshold Amplitude: 1 V
Lead Channel Pacing Threshold Pulse Width: 0.4 ms
Lead Channel Pacing Threshold Pulse Width: 0.4 ms
Lead Channel Sensing Intrinsic Amplitude: 2.3 mV
Lead Channel Sensing Intrinsic Amplitude: 5 mV
Lead Channel Setting Pacing Amplitude: 1.25 V
Lead Channel Setting Pacing Amplitude: 1.625
Lead Channel Setting Pacing Pulse Width: 0.4 ms
Lead Channel Setting Sensing Sensitivity: 2 mV
Pulse Gen Model: 2110
Pulse Gen Serial Number: 7156802

## 2020-10-01 NOTE — Progress Notes (Signed)
Remote pacemaker transmission.   

## 2020-10-03 ENCOUNTER — Ambulatory Visit: Payer: Medicare HMO | Admitting: Family Medicine

## 2020-10-11 NOTE — Progress Notes (Signed)
[place] Phone: [phone] Subjective:   [name], am serving as a scribe for Dr. Hulan Saas. This visit occurred during the SARS-CoV-2 public health emergency.  Safety protocols were in place, including screening questions prior to the visit, additional usage of staff PPE, and extensive cleaning of exam room while observing appropriate contact time as indicated for disinfecting solutions.   I'm seeing this patient by the request  of:  [name], MD  CC: Right shoulder and right knee pain follow-up  [id number]  07/03/2020 Known arthritic changes but continues to be able to be active.  Patient is working out on a regular basis.  Patient is having no significant radicular pain.  Not using anything for pain in the medication department regularly.  Can have injections if necessary but understands that he will need potential replacement at some point follow-up with me again in 3 months  Known arthritic changes of the knee.  Doing relatively well.  We discussed the patient with the knee brace.  This does give him more stability.  Is playing pickle ball regularly.  Encourage patient to continue to stay active.  Follow-up with me again in 3 months no true accumulation of the Baker's cyst.  Update 10/14/2020 [name] [name] is a 80 y.o. male coming in with complaint of R knee and R shoulder pain. Patient states that his shoulder is still bothering him over anterior aspect. Has massage and feels pain over proximal biceps.   Knee pain is getting better. Able to do 10 minutes on elliptical in bursts for his workouts. Using knee sleeve.  Overall feels like he is doing decent.  Still has some difficulty from time to time but nothing severe.      Past Medical History:  Diagnosis Date   Allergy    Arthritis    GERD (gastroesophageal reflux disease)    HYPERLIPIDEMIA 07/17/2008   HYPERTENSION 07/17/2008    Seizures ([place])    12/11/2019   Sinus node dysfunction ([place]) 01/27/2011   a. s/p STJ dual chamber PPM    Sleep apnea    doesnt wear cpap   Past Surgical History:  Procedure Laterality Date   COLONOSCOPY  2016   PACEMAKER PLACEMENT     STJ dual chamber PPM implant 2011   POLYPECTOMY     SKIN CANCER EXCISION     Social History   Socioeconomic History   Marital status: Married    Spouse name: Peggy   Number of children: 1   Years of education: Not on file   Highest education level: Bachelor's degree (e.g., BA, AB, BS)  Occupational History   Occupation: CO-OWNER    Employer: [employer]  Tobacco Use   Smoking status: Former    Packs/day: 0.00    Pack years: 0.00    Types: Cigarettes    Quit date: 08/27/1982    Years since quitting: 38.1   Smokeless tobacco: Never  Vaping Use   Vaping Use: Never used  Substance and Sexual Activity   Alcohol use: Yes    Alcohol/week: 14.0 standard drinks    Types: 14 Standard drinks or equivalent per week    Comment: beer x 2 per day with meals/ may slow down    Drug use: Yes    Types: Marijuana    Comment: once per day   Sexual activity: Not on file  Other Topics Concern   Not on file  Social  History Narrative   Lives with spouse   Right Handed   3-4 c caffeine    Social Determinants of Health   Financial Resource Strain: Not on file  Food Insecurity: Not on file  Transportation Needs: Not on file  Physical Activity: Not on file  Stress: Not on file  Social Connections: Not on file   No Known Allergies Family History  Problem Relation Age of Onset   Hypertension Mother    Stroke Mother    Colitis Father    Hypertension Father    Other Father        esophageal stricture   Liver disease Father    Alcohol abuse Maternal Aunt    Rectal cancer Neg Hx    Stomach cancer Neg Hx    Esophageal cancer Neg Hx    Colon polyps Neg Hx    Colon cancer Neg Hx      Current Outpatient Medications (Cardiovascular):     benazepril (LOTENSIN) 40 MG tablet, TAKE 1 TABLET BY MOUTH EVERY DAY   rosuvastatin (CRESTOR) 5 MG tablet, TAKE 1/2 TABLET BY MOUTH AT BEDTIME.   Current Outpatient Medications (Analgesics):    Acetaminophen (TYLENOL PO), Take by mouth.   Current Outpatient Medications (Other):    Ascorbic Acid (VITAMIN C PO), Take by mouth.   cholecalciferol (VITAMIN D3) 25 MCG (1000 UNIT) tablet, Take 1,000 Units by mouth daily.   Coenzyme Q10 (COQ10 PO), Take by mouth.   levETIRAcetam (KEPPRA) 500 MG tablet, Take 1 tablet (500 mg total) by mouth 2 (two) times daily.   Misc Natural Products (PROSTATE HEALTH PO), Take by mouth.   Omega-3 Fatty Acids (FISH OIL) 1000 MG CPDR, Take 1 capsule by mouth every morning.   Turmeric (QC TUMERIC COMPLEX PO), Take by mouth.     Review of Systems:  No headache, visual changes, nausea, vomiting, diarrhea, constipation, dizziness, abdominal pain, skin rash, fevers, chills, night sweats, weight loss, swollen lymph nodes, body aches, chest pain, shortness of breath, mood changes. POSITIVE muscle aches, joint swelling  Objective  Blood pressure 120/60, pulse 80, height 5\' 6"  (1.676 m), weight 168 lb (76.2 kg), SpO2 97 %.   General: No apparent distress alert and oriented x3 mood and affect normal, dressed appropriately.  HEENT: Pupils equal, extraocular movements intact  Respiratory: Patient's speak in full sentences and does not appear short of breath  Cardiovascular: No lower extremity edema, non tender, no erythema  Mild antalgic gait favoring the right knee. Right shoulder exam shows the patient does have arthritic changes noted.  Mild atrophy noted of the shoulder girdle musculature.  No crepitus noted.  Swelling noted to the posterior glenohumeral joint noted.  Procedure: Real-time Ultrasound Guided Injection of right glenohumeral joint Device: GE Logiq Q7  Ultrasound guided injection is preferred based studies that show increased duration, increased effect,  greater accuracy, decreased procedural pain, increased response rate with ultrasound guided versus blind injection.  Verbal informed consent obtained.  Time-out conducted.  Noted no overlying erythema, induration, or other signs of local infection.  Skin prepped in a sterile fashion.  Local anesthesia: Topical Ethyl chloride.  With sterile technique and under real time ultrasound guidance:  Joint visualized.  23g 1  inch needle inserted posterior approach. Pictures taken for needle placement. Patient did have injection of 2 cc of 1% lidocaine, 2 cc of 0.5% Marcaine, and 1.0 cc of Kenalog 40 mg/dL. Completed without difficulty  Pain immediately resolved suggesting accurate placement of the medication.  Advised to  call if fevers/chills, erythema, induration, drainage, or persistent bleeding.  Impression: Technically successful ultrasound guided injection.    Impression and Recommendations:    The above documentation has been reviewed and is accurate and complete Lyndal Pulley, DO

## 2020-10-14 ENCOUNTER — Ambulatory Visit: Payer: Medicare HMO | Admitting: Family Medicine

## 2020-10-14 ENCOUNTER — Other Ambulatory Visit: Payer: Self-pay

## 2020-10-14 ENCOUNTER — Ambulatory Visit: Payer: Self-pay

## 2020-10-14 ENCOUNTER — Encounter: Payer: Self-pay | Admitting: Family Medicine

## 2020-10-14 VITALS — BP 120/60 | HR 80 | Ht 66.0 in | Wt 168.0 lb

## 2020-10-14 DIAGNOSIS — M1711 Unilateral primary osteoarthritis, right knee: Secondary | ICD-10-CM

## 2020-10-14 DIAGNOSIS — M25511 Pain in right shoulder: Secondary | ICD-10-CM

## 2020-10-14 DIAGNOSIS — G8929 Other chronic pain: Secondary | ICD-10-CM | POA: Diagnosis not present

## 2020-10-14 DIAGNOSIS — M12811 Other specific arthropathies, not elsewhere classified, right shoulder: Secondary | ICD-10-CM | POA: Diagnosis not present

## 2020-10-14 DIAGNOSIS — M75101 Unspecified rotator cuff tear or rupture of right shoulder, not specified as traumatic: Secondary | ICD-10-CM | POA: Diagnosis not present

## 2020-10-14 NOTE — Assessment & Plan Note (Signed)
Known arthritic changes.  On x-ray still seems to be moderate but on ultrasound this seems to be fairly severe.  We discussed icing regimen and home exercises.  Discussed other potential injections.  Patient wants to avoid surgery and overall is being able to do most daily activities and working out regularly.

## 2020-10-14 NOTE — Patient Instructions (Addendum)
Injected shoulder today See me again in 2-3 months

## 2020-10-14 NOTE — Assessment & Plan Note (Signed)
Injection given today for chronic problem with exacerbation.  Discussed with patient that we will need to consider doing this fairly regularly.  Patient though if having worsening pain would need to consider the possibility of surgical intervention which patient wants to avoid completely.  Discussed which activities to potentially avoid.  Follow-up with me again 8 to 12 weeks

## 2020-10-18 ENCOUNTER — Other Ambulatory Visit: Payer: Self-pay

## 2020-10-18 ENCOUNTER — Ambulatory Visit (INDEPENDENT_AMBULATORY_CARE_PROVIDER_SITE_OTHER): Payer: Medicare HMO | Admitting: Internal Medicine

## 2020-10-18 ENCOUNTER — Encounter: Payer: Self-pay | Admitting: Internal Medicine

## 2020-10-18 VITALS — BP 112/64 | HR 76 | Ht 66.0 in | Wt 162.8 lb

## 2020-10-18 DIAGNOSIS — Z950 Presence of cardiac pacemaker: Secondary | ICD-10-CM

## 2020-10-18 DIAGNOSIS — I495 Sick sinus syndrome: Secondary | ICD-10-CM

## 2020-10-18 NOTE — Patient Instructions (Signed)

## 2020-10-18 NOTE — Progress Notes (Signed)
Patient ID: [name] [name], male   DOB: 04/06/1941, 80 y.o.   MRN: [id number]       Patient Care Team: [name], MD as PCP - General   HPI  [name] [name] is a 80 y.o. male Seen in followup for recurrent syncope associated with demonstrated sinus node dysfunction by implantable loop recorder. He is status post pacemaker St Jude implantation 10/11   He has sold the [employer]  Today, the patient denies chest pain, shortness of breath, nocturnal dyspnea, orthopnea or peripheral edema.  There have been no palpitations, lightheadedness or syncope.    No syncope episodes .   He states last year 2021 he had multiple seizures and 2 prior EEG and still no definitive diagnosis    He is physically active playing pickle ball and goes to them gym    Retired from the [employer] after 40 years  Date   Cr              K         Hgb  3/21 0.83 4.9       13.9 (8/21) 13.6  8/21  0.99 4.2              Past Medical History:  Diagnosis Date   Allergy    Arthritis    GERD (gastroesophageal reflux disease)    HYPERLIPIDEMIA 07/17/2008   HYPERTENSION 07/17/2008   Seizures ([place])    12/11/2019   Sinus node dysfunction ([place]) 01/27/2011   a. s/p STJ dual chamber PPM    Sleep apnea    doesnt wear cpap    Past Surgical History:  Procedure Laterality Date   COLONOSCOPY  2016   PACEMAKER PLACEMENT     STJ dual chamber PPM implant 2011   POLYPECTOMY     SKIN CANCER EXCISION      Current Outpatient Medications  Medication Sig Dispense Refill   Acetaminophen (TYLENOL PO) Take by mouth.     Ascorbic Acid (VITAMIN C PO) Take by mouth.     benazepril (LOTENSIN) 40 MG tablet TAKE 1 TABLET BY MOUTH EVERY DAY 90 tablet 2   cholecalciferol (VITAMIN D3) 25 MCG (1000 UNIT) tablet Take 1,000 Units by mouth daily.     Coenzyme Q10 (COQ10 PO) Take by mouth.     levETIRAcetam (KEPPRA) 500 MG tablet Take 1 tablet (500 mg total) by mouth 2 (two) times daily. 180 tablet 3   Misc Natural Products  (PROSTATE HEALTH PO) Take by mouth.     Omega-3 Fatty Acids (FISH OIL) 1000 MG CPDR Take 1 capsule by mouth every morning. 30 capsule 1   rosuvastatin (CRESTOR) 5 MG tablet TAKE 1/2 TABLET BY MOUTH AT BEDTIME. 45 tablet 3   Turmeric (QC TUMERIC COMPLEX PO) Take by mouth.     No current facility-administered medications for this visit.    No Known Allergies  Review of Systems negative except from HPI and PMH  Physical Exam: BP 112/64   Pulse 76   Ht 5\' 6"  (1.676 m)   Wt 162 lb 12.8 oz (73.8 kg)   SpO2 96%   BMI 26.28 kg/m  Well developed and well nourished in no acute distress HENT normal Neck supple with JVP-flat Lungs Clear Device pocket well healed; without hematoma or erythema.  There is no tethering  Regular rate and rhythm, No gallop  and No murmurs Abd-soft with active BS No Clubbing cyanosis No edema Skin-warm and dry A & Oriented  Grossly normal  sensory and motor function  OVZ:CHYIF @ 76 16/09/35  Assessment and  Plan  Sinus node dysfunction  Syncope  Pacemaker-St. Jude The patient's device was interrogated.  The information was reviewed. No changes were made in the programming.    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  I, Virl Axe, MD, have reviewed all documentation for this visit. The documentation on 10/18/20 for the exam, diagnosis, procedures, and orders are all accurate and complete.

## 2020-11-11 ENCOUNTER — Other Ambulatory Visit: Payer: Self-pay | Admitting: Family Medicine

## 2020-11-29 ENCOUNTER — Other Ambulatory Visit: Payer: Self-pay | Admitting: Family Medicine

## 2020-12-09 ENCOUNTER — Ambulatory Visit: Payer: Medicare HMO | Admitting: Family Medicine

## 2020-12-09 ENCOUNTER — Ambulatory Visit: Payer: Self-pay

## 2020-12-09 ENCOUNTER — Ambulatory Visit (INDEPENDENT_AMBULATORY_CARE_PROVIDER_SITE_OTHER): Payer: Medicare HMO

## 2020-12-09 ENCOUNTER — Other Ambulatory Visit: Payer: Self-pay

## 2020-12-09 VITALS — BP 130/84 | HR 58 | Ht 66.0 in | Wt 165.4 lb

## 2020-12-09 DIAGNOSIS — M25562 Pain in left knee: Secondary | ICD-10-CM | POA: Diagnosis not present

## 2020-12-09 DIAGNOSIS — M1712 Unilateral primary osteoarthritis, left knee: Secondary | ICD-10-CM | POA: Diagnosis not present

## 2020-12-09 DIAGNOSIS — I495 Sick sinus syndrome: Secondary | ICD-10-CM

## 2020-12-09 DIAGNOSIS — M7121 Synovial cyst of popliteal space [Baker], right knee: Secondary | ICD-10-CM

## 2020-12-09 DIAGNOSIS — M25462 Effusion, left knee: Secondary | ICD-10-CM | POA: Diagnosis not present

## 2020-12-09 DIAGNOSIS — M1711 Unilateral primary osteoarthritis, right knee: Secondary | ICD-10-CM

## 2020-12-09 DIAGNOSIS — M25461 Effusion, right knee: Secondary | ICD-10-CM | POA: Diagnosis not present

## 2020-12-09 LAB — CUP PACEART REMOTE DEVICE CHECK
Battery Remaining Longevity: 11 mo
Battery Remaining Percentage: 8 %
Battery Voltage: 2.8 V
Brady Statistic AP VP Percent: 1 %
Brady Statistic AP VS Percent: 5.3 %
Brady Statistic AS VP Percent: 1 %
Brady Statistic AS VS Percent: 95 %
Brady Statistic RA Percent Paced: 5 %
Brady Statistic RV Percent Paced: 1 %
Date Time Interrogation Session: 20220822071126
Implantable Lead Implant Date: 20111010
Implantable Lead Implant Date: 20111010
Implantable Lead Location: 753859
Implantable Lead Location: 753860
Implantable Pulse Generator Implant Date: 20111010
Lead Channel Impedance Value: 290 Ohm
Lead Channel Impedance Value: 310 Ohm
Lead Channel Pacing Threshold Amplitude: 0.75 V
Lead Channel Pacing Threshold Amplitude: 1.25 V
Lead Channel Pacing Threshold Pulse Width: 0.4 ms
Lead Channel Pacing Threshold Pulse Width: 0.4 ms
Lead Channel Sensing Intrinsic Amplitude: 2.8 mV
Lead Channel Sensing Intrinsic Amplitude: 6.4 mV
Lead Channel Setting Pacing Amplitude: 1.5 V
Lead Channel Setting Pacing Amplitude: 1.75 V
Lead Channel Setting Pacing Pulse Width: 0.4 ms
Lead Channel Setting Sensing Sensitivity: 2 mV
Pulse Gen Model: 2110
Pulse Gen Serial Number: 7156802

## 2020-12-09 NOTE — Patient Instructions (Addendum)
Good to see you Injected left knee   Please get an Xray today before you leave- we will get both knees completely.  The patient is advised to apply ice or cold packs intermittently as needed to relieve pain. Compression sleeve to left knee  We will get ultrasound of the left leg to make sure everything else is ok   See me again in 4-6 weeks

## 2020-12-09 NOTE — Progress Notes (Signed)
[place] Phone: [phone] Subjective:   [name] Goltz, PhD, LAT, ATC acting as a scribe for [name] Boxer, DO.  I'm seeing this patient by the request  of:  [name], MD  CC: Knee pain follow-up, shoulder pain follow-up new left-sided knee pain  [id number]  10/14/2020 Known arthritic changes.  On x-ray still seems to be moderate but on ultrasound this seems to be fairly severe.  We discussed icing regimen and home exercises.  Discussed other potential injections.  Patient wants to avoid surgery and overall is being able to do most daily activities and working out regularly.  Injection given today for chronic problem with exacerbation.  Discussed with patient that we will need to consider doing this fairly regularly.  Patient though if having worsening pain would need to consider the possibility of surgical intervention which patient wants to avoid completely.  Discussed which activities to potentially avoid.  Follow-up with me again 8 to 12 weeks  Update 12/09/2020 [name] [name] is a 80 y.o. male coming in with complaint of R shoulder and R knee pain. Patient states R shoulder aches a bit. Pt [facility]. Pt reports R knee is somewhat better. Pt enjoys playing pickle ball and has been icing R knee. Pt has new L knee pain in the posterior aspect of L knee w/ swelling.  Therapies tried- knee brace, ice, horse linament.     Past Medical History:  Diagnosis Date   Allergy    Arthritis    GERD (gastroesophageal reflux disease)    HYPERLIPIDEMIA 07/17/2008   HYPERTENSION 07/17/2008   Seizures ([place])    12/11/2019   Sinus node dysfunction ([place]) 01/27/2011   a. s/p STJ dual chamber PPM    Sleep apnea    doesnt wear cpap   Past Surgical History:  Procedure Laterality Date   COLONOSCOPY  2016   PACEMAKER PLACEMENT     STJ dual chamber PPM implant 2011   POLYPECTOMY     SKIN  CANCER EXCISION     Social History   Socioeconomic History   Marital status: Married    Spouse name: Peggy   Number of children: 1   Years of education: Not on file   Highest education level: Bachelor's degree (e.g., BA, AB, BS)  Occupational History   Occupation: CO-OWNER    Employer: [employer]  Tobacco Use   Smoking status: Former    Packs/day: 0.00    Types: Cigarettes    Quit date: 08/27/1982    Years since quitting: 38.3   Smokeless tobacco: Never  Vaping Use   Vaping Use: Never used  Substance and Sexual Activity   Alcohol use: Yes    Alcohol/week: 14.0 standard drinks    Types: 14 Standard drinks or equivalent per week    Comment: beer x 2 per day with meals/ may slow down    Drug use: Yes    Types: Marijuana    Comment: once per day   Sexual activity: Not on file  Other Topics Concern   Not on file  Social History Narrative   Lives with spouse   Right Handed   3-4 c caffeine    Social Determinants of Health   Financial Resource Strain: Not on file  Food Insecurity: Not on file  Transportation Needs: Not on file  Physical Activity: Not on file  Stress: Not on file  Social Connections: Not on file   No Known Allergies Family History  Problem Relation Age of Onset   Hypertension Mother    Stroke Mother    Colitis Father    Hypertension Father    Other Father        esophageal stricture   Liver disease Father    Alcohol abuse Maternal Aunt    Rectal cancer Neg Hx    Stomach cancer Neg Hx    Esophageal cancer Neg Hx    Colon polyps Neg Hx    Colon cancer Neg Hx      Current Outpatient Medications (Cardiovascular):    benazepril (LOTENSIN) 40 MG tablet, TAKE 1 TABLET BY MOUTH EVERY DAY   rosuvastatin (CRESTOR) 5 MG tablet, TAKE 1 TABLET BY MOUTH EVERY DAY   Current Outpatient Medications (Analgesics):    Acetaminophen (TYLENOL PO), Take by mouth.   Current Outpatient Medications (Other):    Ascorbic Acid (VITAMIN C PO), Take by  mouth.   cholecalciferol (VITAMIN D3) 25 MCG (1000 UNIT) tablet, Take 1,000 Units by mouth daily.   Coenzyme Q10 (COQ10 PO), Take by mouth.   levETIRAcetam (KEPPRA) 500 MG tablet, Take 1 tablet (500 mg total) by mouth 2 (two) times daily.   Misc Natural Products (PROSTATE HEALTH PO), Take by mouth.   Omega-3 Fatty Acids (FISH OIL) 1000 MG CPDR, Take 1 capsule by mouth every morning.   Turmeric (QC TUMERIC COMPLEX PO), Take by mouth.   Reviewed prior external information including notes and imaging from  primary care provider As well as notes that were available from care everywhere and other healthcare systems.  Past medical history, social, surgical and family history all reviewed in electronic medical record.  No pertanent information unless stated regarding to the chief complaint.   Review of Systems:  No headache, visual changes, nausea, vomiting, diarrhea, constipation, dizziness, abdominal pain, skin rash, fevers, chills, night sweats, weight loss, swollen lymph nodes, body aches, joint swelling, chest pain, shortness of breath, mood changes. POSITIVE muscle aches  Objective  Blood pressure 130/84, pulse (!) 58, height '5\' 6"'$  (1.676 m), weight 165 lb 6.4 oz (75 kg), SpO2 98 %.   General: No apparent distress alert and oriented x3 mood and affect normal, dressed appropriately.  HEENT: Pupils equal, extraocular movements intact  Respiratory: Patient's speak in full sentences and does not appear short of breath  Cardiovascular: No lower extremity edema, non tender, no erythema  Gait normal with good balance and coordination.  MSK: Continues to have discomfort and pain of the right shoulder minorly.  Patient does have limited range of motion and does have weakness noted.  Right knee does have some arthritic changes.  Trace effusion noted.  Left knee though does have a moderate effusion noted.  Limited muscular skeletal ultrasound was performed and interpreted by Hulan Saas, M   Limited ultrasound of patient's left knee shows moderate effusion noted.  Patient does have some narrowing of the medial joint line and minorly at the patellofemoral joint line.  Baker's cyst appreciated. Impression: Knee effusion with arthritic changes  After informed written and verbal consent, patient was seated on exam table. Left knee was prepped with alcohol swab and utilizing anterolateral approach, patient's left knee space was injected with 4:1  marcaine 0.5%: Kenalog '40mg'$ /dL. Patient tolerated the procedure well without immediate complications.    Impression and Recommendations:     The above documentation has been reviewed and is accurate and complete Lyndal Pulley, DO

## 2020-12-10 ENCOUNTER — Encounter: Payer: Self-pay | Admitting: Family Medicine

## 2020-12-10 ENCOUNTER — Ambulatory Visit (HOSPITAL_COMMUNITY)
Admission: RE | Admit: 2020-12-10 | Discharge: 2020-12-10 | Disposition: A | Discharge status: Home / Self Care | Payer: Medicare HMO | Source: Ambulatory Visit | Attending: Cardiovascular Disease | Admitting: Cardiovascular Disease

## 2020-12-10 DIAGNOSIS — M25562 Pain in left knee: Secondary | ICD-10-CM

## 2020-12-10 DIAGNOSIS — R600 Localized edema: Secondary | ICD-10-CM

## 2020-12-10 DIAGNOSIS — M1712 Unilateral primary osteoarthritis, left knee: Secondary | ICD-10-CM | POA: Insufficient documentation

## 2020-12-10 NOTE — Assessment & Plan Note (Signed)
Patient given injection and tolerated the procedure well.  Discussed with patient about the potential for viscosupplementation.  Hopefully patient will do well just like he has done on the contralateral side.  X-rays are pending.  Follow-up with me again in 4 to 8 weeks

## 2020-12-10 NOTE — Assessment & Plan Note (Signed)
Stable, no significant reaccumulation.

## 2020-12-25 NOTE — Progress Notes (Signed)
Remote pacemaker transmission.   

## 2021-01-07 ENCOUNTER — Ambulatory Visit: Payer: Medicare HMO | Admitting: Family Medicine

## 2021-01-13 NOTE — Progress Notes (Signed)
[facility] [place] [address] [place] Phone: [phone] Subjective:   [name] [name], am serving as a scribe for Dr. Hulan Saas. This visit occurred during the SARS-CoV-2 public health emergency.  Safety protocols were in place, including screening questions prior to the visit, additional usage of staff PPE, and extensive cleaning of exam room while observing appropriate contact time as indicated for disinfecting solutions.   I'm seeing this patient by the request  of:  [name], MD  CC: Bilateral knee pain follow-up  WLN:[id number]  12/09/2020 Patient given injection and tolerated the procedure well.  Discussed with patient about the potential for viscosupplementation.  Hopefully patient will do well just like he has done on the contralateral side.  X-rays are pending.  Follow-up with me again in 4 to 8 weeks  Updated 01/14/2021 [name] [name] is a 80 y.o. male coming in with complaint of left knee pain. Knee is getting progressively better. He plays pickleball 2-3x a week and goes to the gym often.  Xray 12/09/2020 IMPRESSION: Left 1. Probable moderate knee joint effusion. Air noted within the suprapatellar space. Question has the patient had a recent knee joint injection. If not infection cannot be excluded.   2.  No acute bony or joint abnormality.   3.  Peripheral vascular disease.  IMPRESSION:Right 1.  Probable small to moderate right knee joint effusion.   2. Mild patellofemoral and medial compartment mild degenerative change. Tiny corticated bony density noted adjacent to the lateral aspect of the patella. This may be related to prior injury. No acute abnormality identified. 3. Peripheral vascular disease.   Past Medical History:  Diagnosis Date   Allergy    Arthritis    GERD (gastroesophageal reflux disease)    HYPERLIPIDEMIA 07/17/2008   HYPERTENSION 07/17/2008   Seizures ([place])    12/11/2019   Sinus node  dysfunction ([place]) 01/27/2011   a. s/p STJ dual chamber PPM    Sleep apnea    doesnt wear cpap   Past Surgical History:  Procedure Laterality Date   COLONOSCOPY  2016   PACEMAKER PLACEMENT     STJ dual chamber PPM implant 2011   POLYPECTOMY     SKIN CANCER EXCISION     Social History   Socioeconomic History   Marital status: Married    Spouse name: Peggy   Number of children: 1   Years of education: Not on file   Highest education level: Bachelor's degree (e.g., BA, AB, BS)  Occupational History   Occupation: CO-OWNER    Employer: [employer]  Tobacco Use   Smoking status: Former    Packs/day: 0.00    Types: Cigarettes    Quit date: 08/27/1982    Years since quitting: 38.4   Smokeless tobacco: Never  Vaping Use   Vaping Use: Never used  Substance and Sexual Activity   Alcohol use: Yes    Alcohol/week: 14.0 standard drinks    Types: 14 Standard drinks or equivalent per week    Comment: beer x 2 per day with meals/ may slow down    Drug use: Yes    Types: Marijuana    Comment: once per day   Sexual activity: Not on file  Other Topics Concern   Not on file  Social History Narrative   Lives with spouse   Right Handed   3-4 c caffeine    Social Determinants of Health   Financial Resource Strain: Not on file  Food Insecurity: Not on file  Transportation Needs: Not on file  Physical Activity: Not on file  Stress: Not on file  Social Connections: Not on file   No Known Allergies Family History  Problem Relation Age of Onset   Hypertension Mother    Stroke Mother    Colitis Father    Hypertension Father    Other Father        esophageal stricture   Liver disease Father    Alcohol abuse Maternal Aunt    Rectal cancer Neg Hx    Stomach cancer Neg Hx    Esophageal cancer Neg Hx    Colon polyps Neg Hx    Colon cancer Neg Hx      Current Outpatient Medications (Cardiovascular):    benazepril (LOTENSIN) 40 MG tablet, TAKE 1 TABLET BY MOUTH EVERY DAY    rosuvastatin (CRESTOR) 5 MG tablet, TAKE 1 TABLET BY MOUTH EVERY DAY   Current Outpatient Medications (Analgesics):    Acetaminophen (TYLENOL PO), Take by mouth.   Current Outpatient Medications (Other):    Ascorbic Acid (VITAMIN C PO), Take by mouth.   cholecalciferol (VITAMIN D3) 25 MCG (1000 UNIT) tablet, Take 1,000 Units by mouth daily.   Coenzyme Q10 (COQ10 PO), Take by mouth.   levETIRAcetam (KEPPRA) 500 MG tablet, Take 1 tablet (500 mg total) by mouth 2 (two) times daily.   Misc Natural Products (PROSTATE HEALTH PO), Take by mouth.   Omega-3 Fatty Acids (FISH OIL) 1000 MG CPDR, Take 1 capsule by mouth every morning.   Turmeric (QC TUMERIC COMPLEX PO), Take by mouth.   Reviewed prior external information including notes and imaging from  primary care provider As well as notes that were available from care everywhere and other healthcare systems.  Past medical history, social, surgical and family history all reviewed in electronic medical record.  No pertanent information unless stated regarding to the chief complaint.   Review of Systems:  No headache, visual changes, nausea, vomiting, diarrhea, constipation, dizziness, abdominal pain, skin rash, fevers, chills, night sweats, weight loss, swollen lymph nodes, body aches, joint swelling, chest pain, shortness of breath, mood changes. POSITIVE muscle aches  Objective  Blood pressure 120/60, pulse 68, height 5\' 6"  (1.676 m), weight 162 lb (73.5 kg), SpO2 97 %.   General: No apparent distress alert and oriented x3 mood and affect normal, dressed appropriately.  Patient looks healthy.   HEENT: Pupils equal, extraocular movements intact  Respiratory: Patient's speak in full sentences and does not appear short of breath  Cardiovascular: No lower extremity edema, non tender, no erythema  Gait normal with good balance and coordination.  MSK: Right shoulder exam does have some lateral atrophy noted.  Mild crepitus noted but seems to  be stable. Bilateral knees do have arthritic changes.  Patient still has knee hypertrophy noted at the anterior lateral aspect of the knee that is nontender.  Patient has good range of motion.  No significant fullness of the popliteal area.    Impression and Recommendations:     The above documentation has been reviewed and is accurate and complete Lyndal Pulley, DO

## 2021-01-14 ENCOUNTER — Other Ambulatory Visit: Payer: Self-pay

## 2021-01-14 ENCOUNTER — Ambulatory Visit: Payer: Medicare HMO | Admitting: Family Medicine

## 2021-01-14 DIAGNOSIS — M12811 Other specific arthropathies, not elsewhere classified, right shoulder: Secondary | ICD-10-CM | POA: Diagnosis not present

## 2021-01-14 DIAGNOSIS — M75101 Unspecified rotator cuff tear or rupture of right shoulder, not specified as traumatic: Secondary | ICD-10-CM | POA: Diagnosis not present

## 2021-01-14 DIAGNOSIS — M1711 Unilateral primary osteoarthritis, right knee: Secondary | ICD-10-CM | POA: Diagnosis not present

## 2021-01-14 NOTE — Assessment & Plan Note (Signed)
Stable.       - Continue to monitor

## 2021-01-14 NOTE — Patient Instructions (Signed)
Good to see you! Glad you're doing well Keep beating people up playing pickleball See you again in 10-12 weeks before you go to [place]

## 2021-01-14 NOTE — Assessment & Plan Note (Signed)
Patient is doing much better at this time.  Discussed with patient's Baker's cyst we will continue to monitor.  Patient still has the very mild corticated bone density noted adjacent to the lateral aspect of the patella.  We will continue to monitor as well.  Patient is feeling much better so I do not think that this is concerning and no significant change in size since previous exam.  Patient will follow up with me again in 3 months for [place] to just check again.

## 2021-01-31 DIAGNOSIS — R69 Illness, unspecified: Secondary | ICD-10-CM | POA: Diagnosis not present

## 2021-02-03 ENCOUNTER — Other Ambulatory Visit: Payer: Self-pay | Admitting: *Deleted

## 2021-02-03 ENCOUNTER — Telehealth: Payer: Self-pay | Admitting: Neurology

## 2021-02-03 NOTE — Telephone Encounter (Signed)
I returned the call to the patient's wife. Reports getting into [place], [place] on 01/30/21 for vacation. Shortly after arrival, her husband had a witnessed seizure at the hotel while in bed. Full body shaking, rigid, banging chest with his fist, grunting, eyes open, non-responsive to verbal stimuli. First event lasted a little less than 5 minutes. He had a second seizures, similar event, also lasting a little less than 5 minutes. She called 911 and the ambulance that took an hour to arrive. He had a third seizure while waiting. Transported to the hospital, had a fourth seizure upon arrival. He was also diagnosed with an active UTI and dehydration. He is still having hallucinations that are starting to improve. She was informed his infection is now turning the corner towards resolution. She thinks the plan is to increase his Keppra when he is discharged. Denies any missed seizure medications. He has not started any other new prescriptions or supplements. Felt UTI played role in multiple seizures. She is unsure when he will be discharged. Once he is back home, she will call our office to schedule an appt with MD or NP. She will bring the records from his hospital admission with her. Luckily, she has a friend on the trip with them that speaks spanish and has been helping with the communication gap.

## 2021-02-03 NOTE — Telephone Encounter (Signed)
Pt's wife [name] [name] called stating her husband has had multiple seizure which he has never had before. Currently in [place] and in the hospital. Wife requesting a call back.

## 2021-02-18 ENCOUNTER — Other Ambulatory Visit: Payer: Self-pay

## 2021-02-18 ENCOUNTER — Ambulatory Visit (INDEPENDENT_AMBULATORY_CARE_PROVIDER_SITE_OTHER): Payer: Medicare HMO | Admitting: Family Medicine

## 2021-02-18 DIAGNOSIS — G40909 Epilepsy, unspecified, not intractable, without status epilepticus: Secondary | ICD-10-CM | POA: Diagnosis not present

## 2021-02-18 NOTE — Patient Instructions (Signed)
We need to get you back in to see Dr Leonie Man as soon as possible  If you have any trouble getting back in over next few weeks let me know.    Avoid driving until notified by neurology.

## 2021-02-18 NOTE — Progress Notes (Signed)
Established Patient Office Visit  Subjective:  Patient ID: [name] [name], male    DOB: July 15, 1940  Age: 80 y.o. MRN: [id number]  CC:  Chief Complaint  Patient presents with   Hospitalization Follow-up    HPI [name] [name] presents for recent seizures.  He has history of sinus node dysfunction, hypertension, hyperlipidemia.  He and his wife were down in [place] and on the day they arrived, October 14,  he had 3 seizures at night in his sleep.  He was taken to local hospital and had 1 more seizure on arrival to the hospital.  Was admitted there for further evaluation.  We do not have records at this time.  He apparently had couple of CAT scans that did not show any acute abnormality.  He has pacemaker and cannot have MRI scans.  Reportedly had urinary tract infection which was treated with ceftriaxone.  He was hospitalized for 5 days.  Very lethargic for the first couple days.  They were not aware of any significant lab abnormalities. He was discharged on his usual Keppra 500 mg twice daily and has not had any seizures since then.  They think he may have been somewhat dehydrated on admission.  He has had prior seizure back May 2021 which was first known seizure.  He was seen by neurology at that time.  3 months later he had a seizure and was seen at [facility] and that is when Keppra was started.  He has not had any recurrent urinary symptoms.  Denies any recent head injury.  No focal weakness.  He states overall he feels very well this time.  His current medications include Keppra, benazepril, rosuvastatin    Past Medical History:  Diagnosis Date   Allergy    Arthritis    GERD (gastroesophageal reflux disease)    HYPERLIPIDEMIA 07/17/2008   HYPERTENSION 07/17/2008   Seizures ([place])    12/11/2019   Sinus node dysfunction ([place]) 01/27/2011   a. s/p STJ dual chamber PPM    Sleep apnea    doesnt wear cpap    Past Surgical History:  Procedure Laterality Date   COLONOSCOPY  2016    PACEMAKER PLACEMENT     STJ dual chamber PPM implant 2011   POLYPECTOMY     SKIN CANCER EXCISION      Family History  Problem Relation Age of Onset   Hypertension Mother    Stroke Mother    Colitis Father    Hypertension Father    Other Father        esophageal stricture   Liver disease Father    Alcohol abuse Maternal Aunt    Rectal cancer Neg Hx    Stomach cancer Neg Hx    Esophageal cancer Neg Hx    Colon polyps Neg Hx    Colon cancer Neg Hx     Social History   Socioeconomic History   Marital status: Married    Spouse name: Peggy   Number of children: 1   Years of education: Not on file   Highest education level: Bachelor's degree (e.g., BA, AB, BS)  Occupational History   Occupation: CO-OWNER    Employer: [employer]  Tobacco Use   Smoking status: Former    Packs/day: 0.00    Types: Cigarettes    Quit date: 08/27/1982    Years since quitting: 38.5   Smokeless tobacco: Never  Vaping Use   Vaping Use: Never used  Substance and Sexual Activity  Alcohol use: Yes    Alcohol/week: 14.0 standard drinks    Types: 14 Standard drinks or equivalent per week    Comment: beer x 2 per day with meals/ may slow down    Drug use: Yes    Types: Marijuana    Comment: once per day   Sexual activity: Not on file  Other Topics Concern   Not on file  Social History Narrative   Lives with spouse   Right Handed   3-4 c caffeine    Social Determinants of Health   Financial Resource Strain: Not on file  Food Insecurity: Not on file  Transportation Needs: Not on file  Physical Activity: Not on file  Stress: Not on file  Social Connections: Not on file  Intimate Partner Violence: Not on file    Outpatient Medications Prior to Visit  Medication Sig Dispense Refill   Acetaminophen (TYLENOL PO) Take by mouth.     Ascorbic Acid (VITAMIN C PO) Take by mouth.     benazepril (LOTENSIN) 40 MG tablet TAKE 1 TABLET BY MOUTH EVERY DAY 90 tablet 2   cholecalciferol  (VITAMIN D3) 25 MCG (1000 UNIT) tablet Take 1,000 Units by mouth daily.     Coenzyme Q10 (COQ10 PO) Take by mouth.     levETIRAcetam (KEPPRA) 500 MG tablet Take 1 tablet (500 mg total) by mouth 2 (two) times daily. 180 tablet 3   Misc Natural Products (PROSTATE HEALTH PO) Take by mouth.     Omega-3 Fatty Acids (FISH OIL) 1000 MG CPDR Take 1 capsule by mouth every morning. 30 capsule 1   rosuvastatin (CRESTOR) 5 MG tablet TAKE 1 TABLET BY MOUTH EVERY DAY 90 tablet 3   Turmeric (QC TUMERIC COMPLEX PO) Take by mouth.     No facility-administered medications prior to visit.    No Known Allergies  ROS Review of Systems  Constitutional:  Negative for fatigue.  Eyes:  Negative for visual disturbance.  Respiratory:  Negative for cough, chest tightness and shortness of breath.   Cardiovascular:  Negative for chest pain, palpitations and leg swelling.  Endocrine: Negative for polydipsia and polyuria.  Genitourinary:  Negative for dysuria.  Neurological:  Negative for dizziness, syncope, weakness, light-headedness and headaches.     Objective:    Physical Exam Constitutional:      Appearance: He is well-developed.  HENT:     Right Ear: External ear normal.     Left Ear: External ear normal.  Eyes:     Pupils: Pupils are equal, round, and reactive to light.  Neck:     Thyroid: No thyromegaly.  Cardiovascular:     Rate and Rhythm: Normal rate and regular rhythm.  Pulmonary:     Effort: Pulmonary effort is normal. No respiratory distress.     Breath sounds: Normal breath sounds. No wheezing or rales.  Musculoskeletal:     Cervical back: Neck supple.     Right lower leg: No edema.     Left lower leg: No edema.  Neurological:     General: No focal deficit present.     Mental Status: He is alert and oriented to person, place, and time.     Cranial Nerves: No cranial nerve deficit.     Motor: No weakness.     Coordination: Coordination normal.     Gait: Gait normal.    BP 120/62  (BP Location: Left Arm, Patient Position: Sitting, Cuff Size: Normal)   Pulse 65   Temp (!) 97.4  F (36.3 C) (Oral)   Wt 159 lb 12.8 oz (72.5 kg)   SpO2 99%   BMI 25.79 kg/m  Wt Readings from Last 3 Encounters:  02/18/21 159 lb 12.8 oz (72.5 kg)  01/14/21 162 lb (73.5 kg)  12/09/20 165 lb 6.4 oz (75 kg)     Health Maintenance Due  Topic Date Due   Hepatitis C Screening  Never done   Pneumonia Vaccine 51+ Years old (15 - PPSV23 if available, else PCV20) 09/01/2014   Zoster Vaccines- Shingrix (2 of 2) 05/23/2019   INFLUENZA VACCINE  11/18/2020    There are no preventive care reminders to display for this patient.  No results found for: TSH Lab Results  Component Value Date   WBC 6.3 12/11/2019   HGB 13.6 12/11/2019   HCT 42.0 12/11/2019   MCV 98.6 12/11/2019   PLT 157 12/11/2019   Lab Results  Component Value Date   NA 136 12/11/2019   K 4.2 12/11/2019   CO2 21 (L) 12/11/2019   GLUCOSE 104 (H) 12/11/2019   BUN 21 12/11/2019   CREATININE 0.99 12/11/2019   BILITOT 0.6 07/13/2019   ALKPHOS 52 07/13/2019   AST 23 07/13/2019   ALT 20 07/13/2019   PROT 6.5 07/13/2019   ALBUMIN 4.2 07/13/2019   CALCIUM 8.7 (L) 12/11/2019   ANIONGAP 12 12/11/2019   GFR 90.00 07/13/2019   Lab Results  Component Value Date   CHOL 171 09/10/2017   Lab Results  Component Value Date   HDL 64 09/10/2017   Lab Results  Component Value Date   LDLCALC 94 09/10/2017   Lab Results  Component Value Date   TRIG 63 09/10/2017   Lab Results  Component Value Date   CHOLHDL 4 12/22/2016   No results found for: HGBA1C    Assessment & Plan:   Patient presents with recent seizures back to back when he was in [place].  This was reportedly in the setting of UTI which was treated during his recent hospitalization there.  CT scans as above no acute findings.  Patient has had no further seizure activity since discharge.  -He has seen neurology in the past and has regular follow-up April 5.   We have strongly recommended that he get into see neurology sooner for further evaluation.  We will attempt to get him into neurology follow up sooner. -Wife has been diligently trying to get records from the hospitalization without any success thus far. -Continue Keppra 500 mg twice daily in the meantime -Follow-up immediately for any recurrent urinary symptoms, fever, or other concerns -We reiterated the importance of no driving until further evaluated by neurology  No orders of the defined types were placed in this encounter.   Follow-up: No follow-ups on file.    Carolann Littler, MD

## 2021-02-20 ENCOUNTER — Encounter: Payer: Self-pay | Admitting: Neurology

## 2021-02-20 ENCOUNTER — Other Ambulatory Visit: Payer: Self-pay

## 2021-02-20 ENCOUNTER — Ambulatory Visit: Payer: Medicare HMO | Admitting: Neurology

## 2021-02-20 VITALS — BP 129/68 | HR 76 | Ht 67.0 in | Wt 166.0 lb

## 2021-02-20 DIAGNOSIS — G4733 Obstructive sleep apnea (adult) (pediatric): Secondary | ICD-10-CM

## 2021-02-20 DIAGNOSIS — G3184 Mild cognitive impairment, so stated: Secondary | ICD-10-CM

## 2021-02-20 DIAGNOSIS — G40909 Epilepsy, unspecified, not intractable, without status epilepticus: Secondary | ICD-10-CM

## 2021-02-20 MED ORDER — LEVETIRACETAM 500 MG PO TABS: 500.0000 mg | ORAL_TABLET | Freq: Two times a day (BID) | ORAL | 3 refills | Status: DC

## 2021-02-20 NOTE — Progress Notes (Signed)
[facility] [address] [place]. [place] [address] [phone]       OFFICE FOLLOW UP VISIT NOTE  [name] Date of Birth:  11-04-1940 Medical Record Number:  [id number]   Referring MD: Carolann Littler  Reason for Referral: Loss of consciousness episode  JIR:[id number] visit 10/03/2019 ; [name] is a pleasant 80 year old Caucasian male seen today for initial office consultation visit.  Is accompanied by his wife.  History is obtained from him and review of referral notes and imaging results though actual films were not available at the time of this visit.  His past medical history of hypertension, hyperlipidemia, sinus node dysfunction status post pacemaker insertion who was visiting [place] when on 09/12/2019 he had a transient episode of confusion with brief loss of consciousness.  He states that he woke up on that day with an episode of diarrhea and went to the bathroom and then subsequently made himself a cup of coffee.  Last thing he remembers is sitting in a chair and drinking coffee.  Reportedly his sister got up and found him asleep on the chair and in awkward position.  She tried to arouse him but he did not wake up and sounded confused and his speech was garbled and did not make sense.  EMS were called who found him to be arousable but he did not have any focal deficits to raise concern for stroke.  He had one episode of vomiting.  By the time he reached the hospital he was slightly disoriented and confused but had no focal deficits.  His lab work was pretty unremarkable urine drug screen was negative.  Alcohol test was negative.  CT scan of the head showed generalized cortical volume loss with changes of small vessel disease and slight enlargement of the ventricles disproportionate to the degree of atrophy as per radiology report.  CT angiogram of brain and neck was recommended and the neurologist consultation but I do not have   copy of the report she is CT scan  of the abdomen pelvis showed right lower kidney pole mass suspicious for renal cell carcinoma.  Carotid ultrasound showed no significant extracranial stenosis.  EEG was not done.  Patient states he still does not remember all the details of the episode.  He has a pacemaker since 2010 which is MRI incompatible hence it was not done.  Patient states is felt fine since his come here has had no further episodes of loss of consciousness.  He denies any prior history of seizures, significant head injury with loss of consciousness strokes or TIAs.  He does have a long-term mild memory difficulties which are mostly short-term.  These are not progressive as per him and his wife.  He does have a family history of dementia however.  He denies symptoms suggestive of normal pressure hydrocephalus in the form of progressive gait and balance difficulties, bladder incontinence or progressive memory loss. Update 11/13/2019 : He returns for follow-up after last visit 6 weeks ago.  Is accompanied by his wife.  Patient states has not had any seizure-like episode however about a month ago he had a minor spell when he was sitting in the porch outside his house when his wife noticed him to be slightly confused and disoriented.  He had trouble remembering the names of one of the neighbors.  This lasted barely a few minutes and returned back to baseline.  He denies any accompanying headache, tongue bite, injury or incontinence.  The patient continues  to have short-term memory difficulties which are unchanged.  He often misplaces his phone.  He is not participating in any cognitively challenging activities.  Is tolerating aspirin well without bruising or bleeding.  He takes fish oil every day.  He has no new complaints.  He did have EEG done on 11/01/2019 which was normal. Update 01/23/2020 : He is seen today for urgent follow-up visit following recent ER visit to [facility] on 12/11/2019 for possibly unwitnessed seizure.  Patient's  wife woke up in the middle of the night with the patient unresponsive but having tonic activity of both upper extremities which were raised about his body.  He had drooling from his mouth.  He was not answering questions.  She called 911.  Patient was found to be gradually waking up and does not remember till he was in the ER.  He was thought to have an unwitnessed seizure with postictal state and started on Keppra.  Patient seems to be tolerating find a milligrams twice daily well without any side effects.  He has had no further episodes of unresponsiveness or seizure-like activity.  He has not been driving.  He has no family tree of seizures.  States his short-term memory difficulties are unchanged and are not getting worse.  He has no new complaints. Update 07/23/2020 : He returns for follow-up after last visit 6 months ago.  Is accompanied by his wife.  Patient states is done well.  He has no recurrent seizures.  Is tolerating Keppra and the current dose of 500 mg twice daily well without any side effects.  He did have a follow-up EEG on 03/19/2020 which was normal.  He has started driving and has had no issues.  He has no new complaints today. Update 02/20/2021: Patient is seen today upon request as he had some breakthrough seizures.  He was vacationing in [place] when on 01/30/2021 he had 3 back-to-back seizures at home and 1 later at the hospital.  The wife was eyewitness describes a seizure was typical with the patient drawing up his left arm and becoming stiff and unresponsive.  Seizure activity lasted for minutes.  He was quite confused postictally and restless and agitated.  He had to be hospitalized for 5 days and treated with antibiotics for his UTI.  He was hallucinating.  He was continued on Keppra home dose of 5 mg twice daily.  He states is done well since then and had no further breakthrough seizures.  Patient admits that he had a hectic travel and had probably been sleep deprived prior to his  seizure breakthrough.  He continues to have mild short-term memory difficulties but these appear to be unchanged and not progressive.  He does play solitaire and does some cognitive challenging activities.  He has no new complaints today. ROS:   14 system review of systems is positive for loss of consciousness, unresponsiveness, seizure memory difficulties,  , confusion, disorientation and all other systems negative PMH:  Past Medical History:  Diagnosis Date   Allergy    Arthritis    GERD (gastroesophageal reflux disease)    HYPERLIPIDEMIA 07/17/2008   HYPERTENSION 07/17/2008   Seizures ([place])    12/11/2019   Sinus node dysfunction ([place]) 01/27/2011   a. s/p STJ dual chamber PPM    Sleep apnea    doesnt wear cpap    Social History:  Social History   Socioeconomic History   Marital status: Married    Spouse name: Clinical cytogeneticist   Number  of children: 1   Years of education: Not on file   Highest education level: Bachelor's degree (e.g., BA, AB, BS)  Occupational History   Occupation: CO-OWNER    Employer: [employer]  Tobacco Use   Smoking status: Former    Packs/day: 0.00    Types: Cigarettes    Quit date: 08/27/1982    Years since quitting: 38.5   Smokeless tobacco: Never  Vaping Use   Vaping Use: Never used  Substance and Sexual Activity   Alcohol use: Yes    Alcohol/week: 14.0 standard drinks    Types: 14 Standard drinks or equivalent per week    Comment: beer x 2 per day with meals/ may slow down    Drug use: Yes    Types: Marijuana    Comment: once per day   Sexual activity: Not on file  Other Topics Concern   Not on file  Social History Narrative   Lives with spouse   Right Handed   3-4 c caffeine    Social Determinants of Health   Financial Resource Strain: Not on file  Food Insecurity: Not on file  Transportation Needs: Not on file  Physical Activity: Not on file  Stress: Not on file  Social Connections: Not on file  Intimate Partner Violence: Not on file     Medications:   Current Outpatient Medications on File Prior to Visit  Medication Sig Dispense Refill   Acetaminophen (TYLENOL ARTHRITIS PAIN PO) Take by mouth.     Ascorbic Acid (VITAMIN C PO) Take by mouth.     benazepril (LOTENSIN) 40 MG tablet TAKE 1 TABLET BY MOUTH EVERY DAY 90 tablet 2   cholecalciferol (VITAMIN D3) 25 MCG (1000 UNIT) tablet Take 1,000 Units by mouth daily.     Coenzyme Q10 (COQ10 PO) Take 1,000 mg by mouth.     Misc Natural Products (PROSTATE HEALTH PO) Take by mouth.     Omega-3 Fatty Acids (FISH OIL) 1000 MG CPDR Take 1 capsule by mouth every morning. 30 capsule 1   rosuvastatin (CRESTOR) 5 MG tablet TAKE 1 TABLET BY MOUTH EVERY DAY 90 tablet 3   Turmeric (QC TUMERIC COMPLEX PO) Take by mouth.     No current facility-administered medications on file prior to visit.    Allergies:  No Known Allergies  Physical Exam General: well developed, well nourished mildly obese elderly Caucasian male, seated, in no evident distress Head: head normocephalic and atraumatic.   Neck: supple with no carotid or supraclavicular bruits Cardiovascular: regular rate and rhythm, no murmurs Musculoskeletal: no deformity.  Wearing a right knee brace Skin:  no rash/petichiae Vascular:  Normal pulses all extremities  Neurologic Exam Mental Status: Awake and fully alert. Oriented to place and time. Recent and remote memory intact. Attention span, concentration and fund of knowledge appropriate. Mood and affect appropriate.  Cranial Nerves: Fundoscopic exam not done. Pupils equal, briskly reactive to light. Extraocular movements full without nystagmus. Visual fields full to confrontation. Hearing slightly diminished bilaterally despite hearing aids.. Facial sensation intact. Face, tongue, palate moves normally and symmetrically.  Motor: Normal bulk and tone. Normal strength in all tested extremity muscles. Sensory.: intact to touch , pinprick , position and vibratory sensation.   Coordination: Rapid alternating movements normal in all extremities. Finger-to-nose and heel-to-shin performed accurately bilaterally. Gait and Station: Arises from chair without difficulty. Stance is normal. gait demonstrates normal stride length and balance . Able to heel, toe and tandem walk without difficulty.  Reflexes: 1+ and  symmetric. Toes downgoing.     ASSESSMENT: 80 year old Caucasian male with transient episode of loss of consciousness followed by some confusion and disorientation  X 2 now likely unwitnessed seizure with postictal confusion he has had recent breakthrough seizures last month while vacationing in [place] likely related to urinary tract infection and sleep deprivation.  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  Recommend he continue the current dose of Keppra 500 mg twice daily and I encouraged him drink plenty of fluids and to ensure adequate sleep.  If he has further breakthrough seizures we may need to increase the dose of Keppra.  I also advised him not to drive for the next 5 months as per [facility].  He will continue participate in cognitively challenging activities like solving crossword puzzles, playing bridge and sodoku for his mild cognitive impairment which appears to be stable.  He will return for follow-up in the future in 6 months or call earlier if necessary.he has history of sleep apnea in the past had not been able to tolerate CPAP but is now willing to reconsider.  Greater than 50% time during this 35 minute visit was spent on counseling and coordination of care about his seizures and answering questions.  Antony Contras, MD  [facility] [address] [place], [place] [address]  Phone [phone] Fax [phone] Note: This document was prepared with digital dictation and possible smart phrase technology. Any transcriptional errors that result from this process are unintentional.

## 2021-02-20 NOTE — Patient Instructions (Signed)
I had a long discussion with the patient and his wife regarding his recent breakthrough seizures which are likely due to combination of sleep deprivation and urinary tract infection.  Recommend he continue the current dose of Keppra 500 mg twice daily and I encouraged him drink plenty of fluids and to ensure adequate sleep.  If he has further breakthrough seizures we may need to increase the dose of Keppra.  I also advised him not to drive for the next 5 months as per [facility].  He will continue participate in cognitively challenging activities like solving crossword puzzles, playing bridge and sodoku for his mild cognitive impairment which appears to be stable.  He will return for follow-up in the future in 6 months or call earlier if necessary.

## 2021-02-25 ENCOUNTER — Telehealth: Payer: Self-pay

## 2021-02-25 DIAGNOSIS — R300 Dysuria: Secondary | ICD-10-CM | POA: Diagnosis not present

## 2021-02-26 DIAGNOSIS — R300 Dysuria: Secondary | ICD-10-CM | POA: Diagnosis not present

## 2021-02-26 DIAGNOSIS — D3001 Benign neoplasm of right kidney: Secondary | ICD-10-CM | POA: Diagnosis not present

## 2021-03-04 DIAGNOSIS — Z0189 Encounter for other specified special examinations: Secondary | ICD-10-CM | POA: Diagnosis not present

## 2021-03-04 DIAGNOSIS — R768 Other specified abnormal immunological findings in serum: Secondary | ICD-10-CM | POA: Diagnosis not present

## 2021-03-04 DIAGNOSIS — R300 Dysuria: Secondary | ICD-10-CM | POA: Diagnosis not present

## 2021-03-06 DIAGNOSIS — R300 Dysuria: Secondary | ICD-10-CM | POA: Diagnosis not present

## 2021-03-06 DIAGNOSIS — R319 Hematuria, unspecified: Secondary | ICD-10-CM | POA: Diagnosis not present

## 2021-03-16 ENCOUNTER — Emergency Department (HOSPITAL_BASED_OUTPATIENT_CLINIC_OR_DEPARTMENT_OTHER)
Admission: EM | Admit: 2021-03-16 | Discharge: 2021-03-16 | Disposition: A | Discharge status: Home / Self Care | Payer: Medicare HMO | Attending: Emergency Medicine | Admitting: Emergency Medicine

## 2021-03-16 ENCOUNTER — Other Ambulatory Visit: Payer: Self-pay

## 2021-03-16 ENCOUNTER — Encounter (HOSPITAL_BASED_OUTPATIENT_CLINIC_OR_DEPARTMENT_OTHER): Payer: Self-pay

## 2021-03-16 DIAGNOSIS — R040 Epistaxis: Secondary | ICD-10-CM | POA: Diagnosis not present

## 2021-03-16 DIAGNOSIS — Z950 Presence of cardiac pacemaker: Secondary | ICD-10-CM | POA: Insufficient documentation

## 2021-03-16 DIAGNOSIS — Z79899 Other long term (current) drug therapy: Secondary | ICD-10-CM | POA: Diagnosis not present

## 2021-03-16 DIAGNOSIS — I10 Essential (primary) hypertension: Secondary | ICD-10-CM | POA: Diagnosis not present

## 2021-03-16 DIAGNOSIS — Z87891 Personal history of nicotine dependence: Secondary | ICD-10-CM | POA: Diagnosis not present

## 2021-03-16 MED ORDER — OXYMETAZOLINE HCL 0.05 % NA SOLN
1.0000 | Freq: Once | NASAL | Status: AC
  Administered 2021-03-16: 22:00:00 1 via NASAL
  Filled 2021-03-16: qty 30

## 2021-03-16 NOTE — ED Triage Notes (Signed)
Pt reports he went to blow his nose and began bleeding profusely from the Left side. Pt denies taking a blood thinner. Bleeding controlled at this time

## 2021-03-16 NOTE — Discharge Instructions (Signed)
Use the nasal spray in your nose 2 times a day for the next 3 days be sure to use Vaseline inside your nose as well to keep it from drying out.  If it does start bleeding hold pressure for 5 to 10 minutes.

## 2021-03-16 NOTE — ED Notes (Signed)
Pt discharged to home. Discharge instructions have been discussed with patient and/or family members. Pt verbally acknowledges understanding d/c instructions, and endorses comprehension to checkout at registration before leaving.  °

## 2021-03-16 NOTE — ED Provider Notes (Signed)
[facility] EMERGENCY DEPT Provider Note   CSN: [id number] Arrival date & time: 03/16/21  2114     History Chief Complaint  Patient presents with   Epistaxis    [name] [name] is a 80 y.o. male.  The history is provided by the patient and the spouse.  Epistaxis Location:  L nare Severity:  Moderate Duration:  10 minutes Timing:  Intermittent Progression:  Resolved Chronicity:  New Context comment:  Started after blowing his nose today.  He does often have congestion and uses saline spray daily in his nose but no nasal steroids Relieved by:  Applying pressure Worsened by:  Nothing Ineffective treatments:  None tried Associated symptoms: congestion   Associated symptoms: no blood in oropharynx, no cough, no facial pain, no fever and no headaches   Risk factors: allergies   Risk factors: no frequent nosebleeds and no intranasal steroids       Past Medical History:  Diagnosis Date   Allergy    Arthritis    GERD (gastroesophageal reflux disease)    HYPERLIPIDEMIA 07/17/2008   HYPERTENSION 07/17/2008   Seizures ([place])    12/11/2019   Sinus node dysfunction ([place]) 01/27/2011   a. 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   Degenerative arthritis of right knee 07/13/2019   Acute medial meniscal tear, right, initial encounter 07/13/2019   Baker's cyst, right 07/13/2019   GERD (gastroesophageal reflux disease) 01/06/2015   Cerumen impaction 07/31/2014   Abdominal pain, chronic, epigastric 04/17/2014   Arthritis 04/17/2014   Essential hypertension, benign 04/17/2014   History of adenomatous polyp of colon 08/11/2011   Sinus node dysfunction ([place]) 01/27/2011   CHEST WALL PAIN, ACUTE 10/11/2009   PACEMAKER-[place] 12/18/2008   SYNCOPE 07/24/2008    Hyperlipidemia 07/17/2008   Essential hypertension 07/17/2008    Past Surgical History:  Procedure Laterality Date   COLONOSCOPY  2016   PACEMAKER PLACEMENT     STJ dual chamber PPM implant 2011   POLYPECTOMY     SKIN CANCER EXCISION         Family History  Problem Relation Age of Onset   Hypertension Mother    Stroke Mother    Colitis Father    Hypertension Father    Other Father        esophageal stricture   Liver disease Father    Alcohol abuse Maternal Aunt    Rectal cancer Neg Hx    Stomach cancer Neg Hx    Esophageal cancer Neg Hx    Colon polyps Neg Hx    Colon cancer Neg Hx     Social History   Tobacco Use   Smoking status: Former    Packs/day: 0.00    Types: Cigarettes    Quit date: 08/27/1982    Years since quitting: 38.5   Smokeless tobacco: Never  Vaping Use   Vaping Use: Never used  Substance Use Topics   Alcohol use: Yes    Alcohol/week: 14.0 standard drinks    Types: 14 Standard drinks or equivalent per week    Comment: beer x 2 per day with meals/ may slow down    Drug use: Yes    Types: Marijuana    Comment: once per day  Home Medications Prior to Admission medications   Medication Sig Start Date End Date Taking? Authorizing Provider  Acetaminophen (TYLENOL ARTHRITIS PAIN PO) Take by mouth.    [provider]  Ascorbic Acid (VITAMIN C PO) Take by mouth.    [provider]  benazepril (LOTENSIN) 40 MG tablet TAKE 1 TABLET BY MOUTH EVERY DAY 11/29/20   Burchette, Alinda Sierras, MD  cholecalciferol (VITAMIN D3) 25 MCG (1000 UNIT) tablet Take 1,000 Units by mouth daily.    [provider]  Coenzyme Q10 (COQ10 PO) Take 1,000 mg by mouth.    [provider]  levETIRAcetam (KEPPRA) 500 MG tablet Take 1 tablet (500 mg total) by mouth 2 (two) times daily. 02/20/21   Garvin Fila, MD  Misc Natural Products (PROSTATE HEALTH PO) Take by mouth.    [provider]  Omega-3 Fatty Acids (FISH OIL) 1000 MG CPDR  Take 1 capsule by mouth every morning. 10/05/19   Garvin Fila, MD  rosuvastatin (CRESTOR) 5 MG tablet TAKE 1 TABLET BY MOUTH EVERY DAY 11/11/20   Burchette, Alinda Sierras, MD  Turmeric (QC TUMERIC COMPLEX PO) Take by mouth.    [provider]    Allergies    Patient has no known allergies.  Review of Systems   Review of Systems  Constitutional:  Negative for fever.  HENT:  Positive for congestion and nosebleeds.   Respiratory:  Negative for cough.   Neurological:  Negative for headaches.  All other systems reviewed and are negative.  Physical Exam Updated Vital Signs BP (!) 168/74   Pulse 86   Temp 98.3 F (36.8 C)   Resp 16   SpO2 99%   Physical Exam Vitals and nursing note reviewed.  Constitutional:      General: He is not in acute distress.    Appearance: He is well-developed.  HENT:     Head: Normocephalic and atraumatic.     Nose:     Left Nostril: Epistaxis present.     Comments: Fresh blood present in the left nare but no active bleeding at this time.  No blood in the oropharynx Eyes:     Conjunctiva/sclera: Conjunctivae normal.     Pupils: Pupils are equal, round, and reactive to light.  Cardiovascular:     Rate and Rhythm: Normal rate.  Pulmonary:     Effort: Pulmonary effort is normal. No respiratory distress.  Musculoskeletal:        General: No tenderness. Normal range of motion.     Cervical back: Normal range of motion and neck supple.  Skin:    General: Skin is warm and dry.     Findings: No erythema or rash.  Neurological:     Mental Status: He is alert and oriented to person, place, and time. Mental status is at baseline.  Psychiatric:        Mood and Affect: Mood normal.        Behavior: Behavior normal.    ED Results / Procedures / Treatments   Labs (all labs ordered are listed, but only abnormal results are displayed) Labs Reviewed - No data to display  EKG None  Radiology No results found.  Procedures Procedures    Medications Ordered in ED Medications  oxymetazoline (AFRIN) 0.05 % nasal spray 1 spray (1 spray Each Nare Given 03/16/21 2206)    ED Course  I have reviewed the triage vital signs and the nursing notes.  Pertinent labs & imaging results that were available  during my care of the patient were reviewed by me and considered in my medical decision making (see chart for details).    MDM Rules/Calculators/A&P                           Patient presenting today with epistaxis after blowing his nose.  Appears to be anterior left-sided epistaxis.  No active bleeding at this time.  Afrin was placed and offered patient a period of observation however he desires to go home at this time.  Encouraged Vaseline to keep the nose from drying out and cracking.  He will use Afrin for the next few days.  He was given ENT follow-up as needed.  He does not take any anticoagulation.  Otherwise well-appearing.   Final Clinical Impression(s) / ED Diagnoses Final diagnoses:  Acute anterior epistaxis    Rx / DC Orders ED Discharge Orders     None        Blanchie Dessert, MD 03/16/21 2227

## 2021-03-16 NOTE — ED Notes (Signed)
ED Provider at bedside. 

## 2021-03-18 ENCOUNTER — Ambulatory Visit (INDEPENDENT_AMBULATORY_CARE_PROVIDER_SITE_OTHER): Payer: Medicare HMO

## 2021-03-18 DIAGNOSIS — I495 Sick sinus syndrome: Secondary | ICD-10-CM | POA: Diagnosis not present

## 2021-03-18 LAB — CUP PACEART REMOTE DEVICE CHECK
Battery Remaining Longevity: 9 mo
Battery Remaining Percentage: 7 %
Battery Voltage: 2.78 V
Brady Statistic AP VP Percent: 1 %
Brady Statistic AP VS Percent: 4.1 %
Brady Statistic AS VP Percent: 1 %
Brady Statistic AS VS Percent: 96 %
Brady Statistic RA Percent Paced: 3.9 %
Brady Statistic RV Percent Paced: 1 %
Date Time Interrogation Session: 20221128142023
Implantable Lead Implant Date: 20111010
Implantable Lead Implant Date: 20111010
Implantable Lead Location: 753859
Implantable Lead Location: 753860
Implantable Pulse Generator Implant Date: 20111010
Lead Channel Impedance Value: 260 Ohm
Lead Channel Impedance Value: 310 Ohm
Lead Channel Pacing Threshold Amplitude: 0.5 V
Lead Channel Pacing Threshold Amplitude: 1.25 V
Lead Channel Pacing Threshold Pulse Width: 0.4 ms
Lead Channel Pacing Threshold Pulse Width: 0.4 ms
Lead Channel Sensing Intrinsic Amplitude: 3.1 mV
Lead Channel Sensing Intrinsic Amplitude: 4 mV
Lead Channel Setting Pacing Amplitude: 1.5 V
Lead Channel Setting Pacing Amplitude: 1.5 V
Lead Channel Setting Pacing Pulse Width: 0.4 ms
Lead Channel Setting Sensing Sensitivity: 2 mV
Pulse Gen Model: 2110
Pulse Gen Serial Number: 7156802

## 2021-03-25 ENCOUNTER — Ambulatory Visit: Payer: Medicare HMO | Admitting: Family Medicine

## 2021-03-27 NOTE — Progress Notes (Signed)
Remote pacemaker transmission.   

## 2021-04-08 ENCOUNTER — Telehealth: Payer: Self-pay | Admitting: Family Medicine

## 2021-04-08 NOTE — Telephone Encounter (Signed)
Left message for patient to call back and schedule Medicare Annual Wellness Visit (AWV) either virtually or in office. Left  my Herbie Drape number [phone]   Last AWV ;05/23/19  please schedule at anytime with [facility] Nurse Health Advisor 1 or 2   This should be a 45 minute visit.

## 2021-04-16 NOTE — Progress Notes (Signed)
[place] Phone: [phone] Subjective:   [name], am serving as a scribe for Dr. Hulan Saas.This visit occurred during the SARS-CoV-2 public health emergency.  Safety protocols were in place, including screening questions prior to the visit, additional usage of staff PPE, and extensive cleaning of exam room while observing appropriate contact time as indicated for disinfecting solutions.  I'm seeing this patient by the request  of:  [name], MD  CC: bilateral knee and right shoulder pain f/u   ZWC:HENIDPOEUM  01/14/2021 Patient is doing much better at this time.  Discussed with patient's Baker's cyst we will continue to monitor.  Patient still has the very mild corticated bone density noted adjacent to the lateral aspect of the patella.  We will continue to monitor as well.  Patient is feeling much better so I do not think that this is concerning and no significant change in size since previous exam.  Patient will follow up with me again in 3 months for [place] to just check again.  Updated 04/17/2021 SUEDE GREENAWALT is a 80 y.o. male coming in with complaint of R knee and right shoulder pain. States that he continues to have pain in R shoulder. Pain after pickleball in the bicep.   Also notes protruding tibia that has not improved since last visit. Notes a sensation that feels like bone on bone but is not having pain in the R knee.        Past Medical History:  Diagnosis Date   Allergy    Arthritis    GERD (gastroesophageal reflux disease)    HYPERLIPIDEMIA 07/17/2008   HYPERTENSION 07/17/2008   Seizures ([place])    12/11/2019   Sinus node dysfunction ([place]) 01/27/2011   a. s/p STJ dual chamber PPM    Sleep apnea    doesnt wear cpap   Past Surgical History:  Procedure Laterality Date   COLONOSCOPY  2016   PACEMAKER PLACEMENT     STJ dual chamber PPM implant 2011   POLYPECTOMY     SKIN CANCER  EXCISION     Social History   Socioeconomic History   Marital status: Married    Spouse name: Peggy   Number of children: 1   Years of education: Not on file   Highest education level: Bachelor's degree (e.g., BA, AB, BS)  Occupational History   Occupation: CO-OWNER    Employer: [employer]  Tobacco Use   Smoking status: Former    Packs/day: 0.00    Types: Cigarettes    Quit date: 08/27/1982    Years since quitting: 38.6   Smokeless tobacco: Never  Vaping Use   Vaping Use: Never used  Substance and Sexual Activity   Alcohol use: Yes    Alcohol/week: 14.0 standard drinks    Types: 14 Standard drinks or equivalent per week    Comment: beer x 2 per day with meals/ may slow down    Drug use: Yes    Types: Marijuana    Comment: once per day   Sexual activity: Not on file  Other Topics Concern   Not on file  Social History Narrative   Lives with spouse   Right Handed   3-4 c caffeine    Social Determinants of Health   Financial Resource Strain: Not on file  Food Insecurity: Not on file  Transportation Needs: Not on file  Physical Activity: Not on file  Stress: Not  on file  Social Connections: Not on file   No Known Allergies Family History  Problem Relation Age of Onset   Hypertension Mother    Stroke Mother    Colitis Father    Hypertension Father    Other Father        esophageal stricture   Liver disease Father    Alcohol abuse Maternal Aunt    Rectal cancer Neg Hx    Stomach cancer Neg Hx    Esophageal cancer Neg Hx    Colon polyps Neg Hx    Colon cancer Neg Hx      Current Outpatient Medications (Cardiovascular):    benazepril (LOTENSIN) 40 MG tablet, TAKE 1 TABLET BY MOUTH EVERY DAY   rosuvastatin (CRESTOR) 5 MG tablet, TAKE 1 TABLET BY MOUTH EVERY DAY   Current Outpatient Medications (Analgesics):    Acetaminophen (TYLENOL ARTHRITIS PAIN PO), Take by mouth.   Current Outpatient Medications (Other):    Ascorbic Acid (VITAMIN C PO), Take by  mouth.   cholecalciferol (VITAMIN D3) 25 MCG (1000 UNIT) tablet, Take 1,000 Units by mouth daily.   Coenzyme Q10 (COQ10 PO), Take 1,000 mg by mouth.   levETIRAcetam (KEPPRA) 500 MG tablet, Take 1 tablet (500 mg total) by mouth 2 (two) times daily.   Misc Natural Products (PROSTATE HEALTH PO), Take by mouth.   Omega-3 Fatty Acids (FISH OIL) 1000 MG CPDR, Take 1 capsule by mouth every morning.   Turmeric (QC TUMERIC COMPLEX PO), Take by mouth.   Reviewed prior external information including notes and imaging from  primary care provider As well as notes that were available from care everywhere and other healthcare systems.  Past medical history, social, surgical and family history all reviewed in electronic medical record.  No pertanent information unless stated regarding to the chief complaint.   Review of Systems:  No headache, visual changes, nausea, vomiting, diarrhea, constipation, dizziness, abdominal pain, skin rash, fevers, chills, night sweats, weight loss, swollen lymph nodes, body aches, joint swelling, chest pain, shortness of breath, mood changes. POSITIVE muscle aches  Objective  Blood pressure 132/72, pulse (!) 59, height 5\' 7"  (1.702 m), weight 165 lb (74.8 kg), SpO2 98 %.   General: No apparent distress alert and oriented x3 mood and affect normal, dressed appropriately.  HEENT: Pupils equal, extraocular movements intact  Respiratory: Patient's speak in full sentences and does not appear short of breath  Cardiovascular: No lower extremity edema, non tender, no erythema  Gait normal with good balance and coordination.  MSK: Patient's right knee does have arthritic changes noted.  Still has the enlargement of the anterior lateral tibia noted.  Tender to palpation on the lateral aspect.  Positive McMurray's noted. Right shoulder still has some limited range of motion.  Atrophy noted.  Of the shoulder surrounding musculature.  Mild crepitus with range of motion of the shoulder  with weakness noted.  After informed written and verbal consent, patient was seated on exam table. Right knee was prepped with alcohol swab and utilizing anterolateral approach, patient's right knee space was injected with 4:1  marcaine 0.5%: Kenalog 40mg /dL. Patient tolerated the procedure well without immediate complications.    Impression and Recommendations:     The above documentation has been reviewed and is accurate and complete Lyndal Pulley, DO

## 2021-04-17 ENCOUNTER — Ambulatory Visit: Payer: Self-pay

## 2021-04-17 ENCOUNTER — Ambulatory Visit (INDEPENDENT_AMBULATORY_CARE_PROVIDER_SITE_OTHER): Payer: Medicare HMO | Admitting: Family Medicine

## 2021-04-17 ENCOUNTER — Other Ambulatory Visit: Payer: Self-pay

## 2021-04-17 ENCOUNTER — Encounter: Payer: Self-pay | Admitting: Family Medicine

## 2021-04-17 VITALS — BP 132/72 | HR 59 | Ht 67.0 in | Wt 165.0 lb

## 2021-04-17 DIAGNOSIS — M25511 Pain in right shoulder: Secondary | ICD-10-CM

## 2021-04-17 DIAGNOSIS — M1711 Unilateral primary osteoarthritis, right knee: Secondary | ICD-10-CM | POA: Diagnosis not present

## 2021-04-17 DIAGNOSIS — M12811 Other specific arthropathies, not elsewhere classified, right shoulder: Secondary | ICD-10-CM

## 2021-04-17 DIAGNOSIS — M25512 Pain in left shoulder: Secondary | ICD-10-CM | POA: Diagnosis not present

## 2021-04-17 DIAGNOSIS — G8929 Other chronic pain: Secondary | ICD-10-CM | POA: Diagnosis not present

## 2021-04-17 DIAGNOSIS — M75101 Unspecified rotator cuff tear or rupture of right shoulder, not specified as traumatic: Secondary | ICD-10-CM | POA: Diagnosis not present

## 2021-04-17 NOTE — Assessment & Plan Note (Signed)
Patient has had only 1 injection so far in the shoulder.  We discussed the possibility of another 1.  Patient declined it at the moment.  Discussed which activities to do which wants to avoid.  Increase activity slowly.  Did discuss potential compression.  Follow-up again in 2 months

## 2021-04-17 NOTE — Assessment & Plan Note (Signed)
Patient does have some arthritic changes noted.  Patient still does have pain on the anterior lateral aspect of the knee.  Does have a cortical irregularity noted of the tibia.  We discussed the possibility of a CT scan which patient declined.  Feels like he would not want any type of surgical intervention at this time.  We did discuss though if this cortical irregularity did seem to get bigger and need further imaging.  Patient did respond extremely well to the injection and hopefully will do well.  Patient will [place] of state for the next 2 months.  We will follow-up with me after that.

## 2021-04-17 NOTE — Patient Instructions (Addendum)
Good to see you Injected knee Have fun being snow bird See me in 8 weeks

## 2021-04-18 ENCOUNTER — Ambulatory Visit (INDEPENDENT_AMBULATORY_CARE_PROVIDER_SITE_OTHER): Payer: Medicare HMO

## 2021-04-18 VITALS — Ht 67.0 in | Wt 166.0 lb

## 2021-04-18 DIAGNOSIS — Z0000 Encounter for general adult medical examination without abnormal findings: Secondary | ICD-10-CM | POA: Diagnosis not present

## 2021-04-18 NOTE — Progress Notes (Signed)
Subjective:   [name] [name] is a 80 y.o. male who presents for Medicare Annual/Subsequent preventive examination.  Review of Systems    No ROS Cardiac Risk Factors include: advanced age (>65men, >52 women);hypertension   Objective:    Today's Vitals   04/18/21 0815  Weight: 166 lb (75.3 kg)  Height: 5\' 7"  (1.702 m)   Body mass index is 26 kg/m.  Advanced Directives 04/18/2021 03/16/2021 12/11/2019 05/23/2019 01/19/2018 01/12/2017 09/05/2014  Does Patient Have a Medical Advance Directive? Yes No No Yes Yes Yes Yes  Type of Paramedic of [place];Living will - - [place];Living will - - [place];Living will  Does patient want to make changes to medical advance directive? No - Patient declined - - No - Patient declined - - -  Copy of [place] in Chart? No - copy requested - - No - copy requested - - -  Would patient like information on creating a medical advance directive? - No - Patient declined - - - - -    Current Medications (verified) Outpatient Encounter Medications as of 04/18/2021  Medication Sig   Acetaminophen (TYLENOL ARTHRITIS PAIN PO) Take by mouth.   Ascorbic Acid (VITAMIN C PO) Take by mouth.   benazepril (LOTENSIN) 40 MG tablet TAKE 1 TABLET BY MOUTH EVERY DAY   cholecalciferol (VITAMIN D3) 25 MCG (1000 UNIT) tablet Take 1,000 Units by mouth daily.   Coenzyme Q10 (COQ10 PO) Take 1,000 mg by mouth.   levETIRAcetam (KEPPRA) 500 MG tablet Take 1 tablet (500 mg total) by mouth 2 (two) times daily.   Misc Natural Products (PROSTATE HEALTH PO) Take by mouth.   Omega-3 Fatty Acids (FISH OIL) 1000 MG CPDR Take 1 capsule by mouth every morning.   rosuvastatin (CRESTOR) 5 MG tablet TAKE 1 TABLET BY MOUTH EVERY DAY   Turmeric (QC TUMERIC COMPLEX PO) Take by mouth.   No facility-administered encounter medications on file as of 04/18/2021.    Allergies (verified) Patient has no known  allergies.   History: Past Medical History:  Diagnosis Date   Allergy    Arthritis    GERD (gastroesophageal reflux disease)    HYPERLIPIDEMIA 07/17/2008   HYPERTENSION 07/17/2008   Seizures ([place])    12/11/2019   Sinus node dysfunction ([place]) 01/27/2011   a. s/p STJ dual chamber PPM    Sleep apnea    doesnt wear cpap   Past Surgical History:  Procedure Laterality Date   COLONOSCOPY  2016   PACEMAKER PLACEMENT     STJ dual chamber PPM implant 2011   POLYPECTOMY     SKIN CANCER EXCISION     Family History  Problem Relation Age of Onset   Hypertension Mother    Stroke Mother    Colitis Father    Hypertension Father    Other Father        esophageal stricture   Liver disease Father    Alcohol abuse Maternal Aunt    Rectal cancer Neg Hx    Stomach cancer Neg Hx    Esophageal cancer Neg Hx    Colon polyps Neg Hx    Colon cancer Neg Hx    Social History   Socioeconomic History   Marital status: Married    Spouse name: Peggy   Number of children: 1   Years of education: Not on file   Highest education level: Bachelor's degree (e.g., BA, AB, BS)  Occupational History  Occupation: CO-OWNER    Employer: [employer]  Tobacco Use   Smoking status: Former    Packs/day: 0.00    Types: Cigarettes    Quit date: 08/27/1982    Years since quitting: 38.6   Smokeless tobacco: Never  Vaping Use   Vaping Use: Never used  Substance and Sexual Activity   Alcohol use: Yes    Alcohol/week: 14.0 standard drinks    Types: 14 Standard drinks or equivalent per week    Comment: beer x 2 per day with meals/ may slow down    Drug use: Yes    Types: Marijuana    Comment: once per day   Sexual activity: Not on file  Other Topics Concern   Not on file  Social History Narrative   Lives with spouse   Right Handed   3-4 c caffeine    Social Determinants of Health   Financial Resource Strain: Not on file  Food Insecurity: Not on file  Transportation Needs: Not on file   Physical Activity: Not on file  Stress: Not on file  Social Connections: Not on file    Clinical Intake:   How often do you need to have someone help you when you read instructions, pamphlets, or other written materials from your doctor or pharmacy?: (P) 1 - Never  Diabetic?  No  Activities of Daily Living In your present state of health, do you have any difficulty performing the following activities: 04/17/2021  Hearing? N  Vision? N  Difficulty concentrating or making decisions? N  Walking or climbing stairs? N  Dressing or bathing? N  Doing errands, shopping? N  Preparing Food and eating ? N  Using the Toilet? N  In the past six months, have you accidently leaked urine? N  Do you have problems with loss of bowel control? N  Managing your Medications? N  Managing your Finances? N  Housekeeping or managing your Housekeeping? N  Some recent data might be hidden    Patient Care Team: [name], MD as PCP - General  Indicate any recent Medical Services you may have received from other than [facility] providers in the past year (date may be approximate).     Assessment:   This is a routine wellness examination for [name] [name].  Virtual Visit via Telephone Note  I connected with  [name] [name] on 04/18/21 at  8:15 AM EST by telephone and verified that I am speaking with the correct person using two identifiers.  Location: Patient: Home Provider: Office Persons participating in the virtual visit: patient/Nurse Health Advisor   I discussed the limitations, risks, security and privacy concerns of performing an evaluation and management service by telephone and the availability of in person appointments. The patient expressed understanding and agreed to proceed.  Interactive audio and video telecommunications were attempted between this nurse and patient, however failed, due to patient having technical difficulties OR patient did not have access to video capability.  We  continued and completed visit with audio only.  Some vital signs may be absent or patient reported.   Criselda Peaches, LPN   Hearing/Vision screen Hearing Screening - Comments:: Wears hearing aids Vision Screening - Comments:: Wears glasses. Followed by [facility]  Dietary issues and exercise activities discussed: Current Exercise Habits: Home exercise routine, Type of exercise: walking, Time (Minutes): 30, Frequency (Times/Week): 5, Weekly Exercise (Minutes/Week): 150, Intensity: Moderate   Goals Addressed             This  Visit's Progress    Patient Stated       Will maintain good habits in exercise and nutrition.       Depression Screen PHQ 2/9 Scores 04/18/2021 02/18/2021 05/23/2019 03/03/2019 01/19/2018 01/12/2017 01/04/2015  PHQ - 2 Score 0 0 0 0 0 0 0    Fall Risk Fall Risk  04/17/2021 02/18/2021 10/03/2019 03/03/2019 01/19/2018  Falls in the past year? 1 1 0 0 No  Comment - - - - -  Number falls in past yr: 0 1 - - -  Comment - - - - -  Injury with Fall? 0 0 - - -    FALL RISK PREVENTION PERTAINING TO THE HOME:  Any stairs in or around the home? Yes  If so, are there any without handrails? No  Home free of loose throw rugs in walkways, pet beds, electrical cords, etc? Yes  Adequate lighting in your home to reduce risk of falls? Yes   ASSISTIVE DEVICES UTILIZED TO PREVENT FALLS:  Life alert? No  Use of a cane, walker or w/c? No  Grab bars in the bathroom? Yes  Shower chair or bench in shower? No  Elevated toilet seat or a handicapped toilet? Yes   TIMED UP AND GO:  Was the test performed? No . Audio Visit  Cognitive Function: MMSE - Mini Mental State Exam 01/19/2018  Not completed: (No Data)     6CIT Screen 04/18/2021 05/23/2019  What Year? 0 points 0 points  What month? 0 points 0 points  What time? 0 points 0 points  Count back from 20 0 points 0 points  Months in reverse 0 points 0 points  Repeat phrase 0 points 0 points  Total Score 0 0     Immunizations Immunization History  Administered Date(s) Administered   Hepatitis A, Adult 02/12/2017   Influenza Split 03/02/2011   Influenza, High Dose Seasonal PF 12/22/2016, 01/07/2018, 12/05/2018, 12/28/2019, 12/31/2020   Influenza,inj,Quad PF,6+ Mos 01/04/2015   Influenza-Unspecified 02/18/2002, 02/18/2006, 01/10/2007, 01/19/2008, 01/18/2009, 01/18/2010, 01/19/2012, 12/19/2012, 01/17/2014, 01/07/2018   Moderna Sars-Covid-2 Vaccination 05/01/2019, 05/29/2019, 02/11/2020, 08/14/2020   Pfizer Covid-19 Vaccine Bivalent Booster 49yrs & up 12/31/2020   Pneumococcal Conjugate-13 08/31/2013   Pneumococcal Polysaccharide-23 04/21/2007   Td 05/21/2006, 11/03/2007, 11/15/2017   Tdap 11/18/2008   Zoster Recombinat (Shingrix) 03/28/2019   Zoster, Live 03/05/2011       Pneumococcal vaccine status: Due, Education has been provided regarding the importance of this vaccine. Advised may receive this vaccine at local pharmacy or Health Dept. Aware to provide a copy of the vaccination record if obtained from local pharmacy or Health Dept. Verbalized acceptance and understanding.  Qualifies for Shingles Vaccine? Yes   Zostavax completed No   Shingrix Completed?: No.    Education has been provided regarding the importance of this vaccine. Patient has been advised to call insurance company to determine out of pocket expense if they have not yet received this vaccine. Advised may also receive vaccine at local pharmacy or Health Dept. Verbalized acceptance and understanding.  Screening Tests Health Maintenance  Topic Date Due   Zoster Vaccines- Shingrix (2 of 2) 07/17/2021 (Originally 05/23/2019)   Pneumonia Vaccine 44+ Years old (37 - PPSV23 if available, else PCV20) 04/18/2022 (Originally 09/01/2014)   Hepatitis C Screening  04/18/2022 (Originally 03/11/1959)   TETANUS/TDAP  11/16/2027   INFLUENZA VACCINE  Completed   COVID-19 Vaccine  Completed   HPV VACCINES  Aged Out   COLONOSCOPY (Pts  45-32yrs Insurance coverage will need to  be confirmed)  Discontinued    Health Maintenance  There are no preventive care reminders to display for this patient.  Vision Screening: Recommended annual ophthalmology exams for early detection of glaucoma and other disorders of the eye. Is the patient up to date with their annual eye exam?  Yes  Who is the provider or what is the name of the office in which the patient attends annual eye exams? Followed by [facility]  Dental Screening: Recommended annual dental exams for proper oral hygiene  Community Resource Referral / Chronic Care Management:  CRR required this visit?  No   CCM required this visit?  No      Plan:     I have personally reviewed and noted the following in the patients chart:   Medical and social history Use of alcohol, tobacco or illicit drugs  Current medications and supplements including opioid prescriptions. Patient is not currently taking opioid prescriptions. Functional ability and status Nutritional status Physical activity Advanced directives List of other physicians Hospitalizations, surgeries, and ER visits in previous 12 months Vitals Screenings to include cognitive, depression, and falls Referrals and appointments  In addition, I have reviewed and discussed with patient certain preventive protocols, quality metrics, and best practice recommendations. A written personalized care plan for preventive services as well as general preventive health recommendations were provided to patient.     Criselda Peaches, LPN   80/32/1224

## 2021-04-18 NOTE — Patient Instructions (Addendum)
[name] [name] , Thank you for taking time to come for your Medicare Wellness Visit. I appreciate your ongoing commitment to your health goals. Please review the following plan we discussed and let me know if I can assist you in the future.   These are the goals we discussed:  Goals      DIET - INCREASE WATER INTAKE     patient     Maintain health and curtail ETOH to 2 per day or less      Patient Stated     Will maintain good habits in exercise and nutrition.        This is a list of the screening recommended for you and due dates:  Health Maintenance  Topic Date Due   Zoster (Shingles) Vaccine (2 of 2) 07/17/2021*   Pneumonia Vaccine (3 - PPSV23 if available, else PCV20) 04/18/2022*   Hepatitis C Screening: USPSTF Recommendation to screen - Ages 18-79 yo.  04/18/2022*   Tetanus Vaccine  11/16/2027   Flu Shot  Completed   COVID-19 Vaccine  Completed   HPV Vaccine  Aged Out   Colon Cancer Screening  Discontinued  *Topic was postponed. The date shown is not the original due date.   Advanced directives: Yes  Conditions/risks identified: None  Next appointment: Follow up in one year for your annual wellness visit.  Preventive Care 34 Years and Older, Male Preventive care refers to lifestyle choices and visits with your health care provider that can promote health and wellness. What does preventive care include? A yearly physical exam. This is also called an annual well check. Dental exams once or twice a year. Routine eye exams. Ask your health care provider how often you should have your eyes checked. Personal lifestyle choices, including: Daily care of your teeth and gums. Regular physical activity. Eating a healthy diet. Avoiding tobacco and drug use. Limiting alcohol use. Practicing safe sex. Taking low doses of aspirin every day. Taking vitamin and mineral supplements as recommended by your health care provider. What happens during an annual well check? The services  and screenings done by your health care provider during your annual well check will depend on your age, overall health, lifestyle risk factors, and family history of disease. Counseling  Your health care provider may ask you questions about your: Alcohol use. Tobacco use. Drug use. Emotional well-being. Home and relationship well-being. Sexual activity. Eating habits. History of falls. Memory and ability to understand (cognition). Work and work Statistician. Screening  You may have the following tests or measurements: Height, weight, and BMI. Blood pressure. Lipid and cholesterol levels. These may be checked every 5 years, or more frequently if you are over 46 years old. Skin check. Lung cancer screening. You may have this screening every year starting at age 78 if you have a 30-pack-year history of smoking and currently smoke or have quit within the past 15 years. Fecal occult blood test (FOBT) of the stool. You may have this test every year starting at age 78. Flexible sigmoidoscopy or colonoscopy. You may have a sigmoidoscopy every 5 years or a colonoscopy every 10 years starting at age 78. Prostate cancer screening. Recommendations will vary depending on your family history and other risks. Hepatitis C blood test. Hepatitis B blood test. Sexually transmitted disease (STD) testing. Diabetes screening. This is done by checking your blood sugar (glucose) after you have not eaten for a while (fasting). You may have this done every 1-3 years. Abdominal aortic aneurysm (AAA) screening. You  may need this if you are a current or former smoker. Osteoporosis. You may be screened starting at age 53 if you are at high risk. Talk with your health care provider about your test results, treatment options, and if necessary, the need for more tests. Vaccines  Your health care provider may recommend certain vaccines, such as: Influenza vaccine. This is recommended every year. Tetanus, diphtheria,  and acellular pertussis (Tdap, Td) vaccine. You may need a Td booster every 10 years. Zoster vaccine. You may need this after age 78. Pneumococcal 13-valent conjugate (PCV13) vaccine. One dose is recommended after age 78. Pneumococcal polysaccharide (PPSV23) vaccine. One dose is recommended after age 78. Talk to your health care provider about which screenings and vaccines you need and how often you need them. This information is not intended to replace advice given to you by your health care provider. Make sure you discuss any questions you have with your health care provider. Document Released: 05/03/2015 Document Revised: 12/25/2015 Document Reviewed: 02/05/2015 Elsevier Interactive Patient Education  2017 [place] Prevention in the Home Falls can cause injuries. They can happen to people of all ages. There are many things you can do to make your home safe and to help prevent falls. What can I do on the outside of my home? Regularly fix the edges of walkways and driveways and fix any cracks. Remove anything that might make you trip as you walk through a door, such as a raised step or threshold. Trim any bushes or trees on the path to your home. Use bright outdoor lighting. Clear any walking paths of anything that might make someone trip, such as rocks or tools. Regularly check to see if handrails are loose or broken. Make sure that both sides of any steps have handrails. Any raised decks and porches should have guardrails on the edges. Have any leaves, snow, or ice cleared regularly. Use sand or salt on walking paths during winter. Clean up any spills in your garage right away. This includes oil or grease spills. What can I do in the bathroom? Use night lights. Install grab bars by the toilet and in the tub and shower. Do not use towel bars as grab bars. Use non-skid mats or decals in the tub or shower. If you need to sit down in the shower, use a plastic, non-slip  stool. Keep the floor dry. Clean up any water that spills on the floor as soon as it happens. Remove soap buildup in the tub or shower regularly. Attach bath mats securely with double-sided non-slip rug tape. Do not have throw rugs and other things on the floor that can make you trip. What can I do in the bedroom? Use night lights. Make sure that you have a light by your bed that is easy to reach. Do not use any sheets or blankets that are too big for your bed. They should not hang down onto the floor. Have a firm chair that has side arms. You can use this for support while you get dressed. Do not have throw rugs and other things on the floor that can make you trip. What can I do in the kitchen? Clean up any spills right away. Avoid walking on wet floors. Keep items that you use a lot in easy-to-reach places. If you need to reach something above you, use a strong step stool that has a grab bar. Keep electrical cords out of the way. Do not use floor polish or wax that  makes floors slippery. If you must use wax, use non-skid floor wax. Do not have throw rugs and other things on the floor that can make you trip. What can I do with my stairs? Do not leave any items on the stairs. Make sure that there are handrails on both sides of the stairs and use them. Fix handrails that are broken or loose. Make sure that handrails are as long as the stairways. Check any carpeting to make sure that it is firmly attached to the stairs. Fix any carpet that is loose or worn. Avoid having throw rugs at the top or bottom of the stairs. If you do have throw rugs, attach them to the floor with carpet tape. Make sure that you have a light switch at the top of the stairs and the bottom of the stairs. If you do not have them, ask someone to add them for you. What else can I do to help prevent falls? Wear shoes that: Do not have high heels. Have rubber bottoms. Are comfortable and fit you well. Are closed at the  toe. Do not wear sandals. If you use a stepladder: Make sure that it is fully opened. Do not climb a closed stepladder. Make sure that both sides of the stepladder are locked into place. Ask someone to hold it for you, if possible. Clearly mark and make sure that you can see: Any grab bars or handrails. First and last steps. Where the edge of each step is. Use tools that help you move around (mobility aids) if they are needed. These include: Canes. Walkers. Scooters. Crutches. Turn on the lights when you go into a dark area. Replace any light bulbs as soon as they burn out. Set up your furniture so you have a clear path. Avoid moving your furniture around. If any of your floors are uneven, fix them. If there are any pets around you, be aware of where they are. Review your medicines with your doctor. Some medicines can make you feel dizzy. This can increase your chance of falling. Ask your doctor what other things that you can do to help prevent falls. This information is not intended to replace advice given to you by your health care provider. Make sure you discuss any questions you have with your health care provider. Document Released: 01/31/2009 Document Revised: 09/12/2015 Document Reviewed: 05/11/2014 Elsevier Interactive Patient Education  2017 [employer].

## 2021-04-25 DIAGNOSIS — Z112 Encounter for screening for other bacterial diseases: Secondary | ICD-10-CM | POA: Diagnosis not present

## 2021-04-28 ENCOUNTER — Telehealth: Payer: Self-pay | Admitting: Neurology

## 2021-04-28 ENCOUNTER — Telehealth: Payer: Self-pay | Admitting: *Deleted

## 2021-04-28 NOTE — Telephone Encounter (Signed)
Patient also sent the following message:   This past Thursday night I had another seizure. I think its time to consider increasing my meds, since my last seizures were only 3 months ago. I still think there may be a connection with low oxygen intake, especially since the seizures all occur at night. My sleep study is scheduled for March 2. Im going out of town at the end of the week and would like to have something resolved by then. Please call me at your convenience. _____________________________________   Per chart, he has levetiracetam 500mg , one tablet BID.

## 2021-04-28 NOTE — Telephone Encounter (Signed)
Pt asking for a call to discuss an increase on his levETIRAcetam (KEPPRA) 500 MG tablet to [place] is asking for an increase in his medication due to a seizure pt had on last Thurs. With the [employer] he has new insurance info: [employer] [phone] [employer] [phone] H [phone] Please call.

## 2021-04-28 NOTE — Telephone Encounter (Signed)
Message from patient:  This past Thursday night I had another seizure. I think its time to consider increasing my meds, since my last seizures were only 3 months ago. I still think there may be a connection with low oxygen intake, especially since the seizures all occur at night. My sleep study is scheduled for March 2. Im going out of town at the end of the week and would like to have something resolved by then. Please call me at your convenience. _____________________________________  Per chart, he has levetiracetam 500mg , one tablet BID.

## 2021-04-28 NOTE — Telephone Encounter (Signed)
Further information in other phone note today.

## 2021-04-29 MED ORDER — LEVETIRACETAM 500 MG PO TABS: ORAL_TABLET | ORAL | 3 refills | Status: AC

## 2021-04-29 NOTE — Addendum Note (Signed)
Addended by: Desmond Lope on: 04/29/2021 09:53 AM   Modules accepted: Orders

## 2021-04-29 NOTE — Telephone Encounter (Signed)
Garvin Fila, MD Agree increase the dose of Keppra to 500 mg in the morning and 1000 mg at night. ______________________________________  I spoke to the patient. He is agreeable to this dosed increase. He would like the new prescription sent to the pharmacy at [facility]. He understands to contact us with any other concerns.

## 2021-05-01 NOTE — Telephone Encounter (Signed)
I spoke to [place] at the [facility] pharmacy in [place]. She informed me the electronic prescription was rejected because the patient was not referred to our office by his PCP at the [place]. That is a requirement for them to be able to fill outside prescriptions. The only other option that would allow him to use the [place] pharmacy would be for his PCP there to rewrite the prescription their name.  I called the patient and provided him with this information. I offered to send the rx to his local [facility]. Says he has enough medication at home to take the higher dose. He would rather call the [place] and get the situation straightened out. He will call back if he needs anything further from our office.

## 2021-05-01 NOTE — Telephone Encounter (Signed)
Pt has called to report that the order for the increase in the [place] has not been sent over to [name] [name] said he called them this morning and they have not received anything yet from Dr Leonie Man

## 2021-06-09 ENCOUNTER — Telehealth: Payer: Self-pay | Admitting: Internal Medicine

## 2021-06-09 NOTE — Telephone Encounter (Signed)
Transmission received, pt notified that it is early and he will need to send again next week.

## 2021-06-09 NOTE — Telephone Encounter (Signed)
Advised patient as of this time, the report has not been received.  Gave instructions for sending transmission. Patient stated he will try again.  Noted after call patient is a week early, his next scheduled check is 06/17/21

## 2021-06-09 NOTE — Telephone Encounter (Signed)
°  1. Has your device fired? No  2. Is you device beeping? No  3. Are you experiencing draining or swelling at device site? No  4. Are you calling to see if we received your device transmission? Yes, sent this morning  5. Have you passed out? No     Please route to [place]

## 2021-06-17 ENCOUNTER — Ambulatory Visit (INDEPENDENT_AMBULATORY_CARE_PROVIDER_SITE_OTHER): Payer: Medicare HMO

## 2021-06-17 ENCOUNTER — Telehealth: Payer: Self-pay

## 2021-06-17 DIAGNOSIS — I495 Sick sinus syndrome: Secondary | ICD-10-CM

## 2021-06-17 LAB — CUP PACEART REMOTE DEVICE CHECK
Battery Remaining Longevity: 9 mo
Battery Remaining Percentage: 6 %
Battery Voltage: 2.77 V
Brady Statistic AP VP Percent: 1 %
Brady Statistic AP VS Percent: 4.3 %
Brady Statistic AS VP Percent: 1 %
Brady Statistic AS VS Percent: 96 %
Brady Statistic RA Percent Paced: 4 %
Brady Statistic RV Percent Paced: 1 %
Date Time Interrogation Session: 20230228082957
Implantable Lead Implant Date: 20111010
Implantable Lead Implant Date: 20111010
Implantable Lead Location: 753859
Implantable Lead Location: 753860
Implantable Pulse Generator Implant Date: 20111010
Lead Channel Impedance Value: 290 Ohm
Lead Channel Impedance Value: 330 Ohm
Lead Channel Pacing Threshold Amplitude: 0.625 V
Lead Channel Pacing Threshold Amplitude: 1.25 V
Lead Channel Pacing Threshold Pulse Width: 0.4 ms
Lead Channel Pacing Threshold Pulse Width: 0.4 ms
Lead Channel Sensing Intrinsic Amplitude: 2.5 mV
Lead Channel Sensing Intrinsic Amplitude: 6.9 mV
Lead Channel Setting Pacing Amplitude: 1.5 V
Lead Channel Setting Pacing Amplitude: 1.625
Lead Channel Setting Pacing Pulse Width: 0.4 ms
Lead Channel Setting Sensing Sensitivity: 2 mV
Pulse Gen Model: 2110
Pulse Gen Serial Number: 7156802

## 2021-06-17 NOTE — Telephone Encounter (Signed)
Scheduled remote reviewed. Normal device function.  Estimated longevity 8.72months.  Routing for increasing remote transmissions to monthly per protocol.  Unsuccessful telephone call to patient to advise of monthly battery checks beginning 07/21/21. Hipaa compliant VM message left requesting call back to [phone]. Appointments updated in epic and [facility].

## 2021-06-18 NOTE — Telephone Encounter (Signed)
MyChart message sent to message device should transmit automatically when scheduled.   ?

## 2021-06-19 ENCOUNTER — Ambulatory Visit: Payer: Medicare Other | Admitting: Neurology

## 2021-06-19 ENCOUNTER — Encounter: Payer: Self-pay | Admitting: Neurology

## 2021-06-19 VITALS — BP 151/67 | HR 61 | Ht 67.0 in | Wt 166.0 lb

## 2021-06-19 DIAGNOSIS — G459 Transient cerebral ischemic attack, unspecified: Secondary | ICD-10-CM | POA: Diagnosis not present

## 2021-06-19 DIAGNOSIS — Z950 Presence of cardiac pacemaker: Secondary | ICD-10-CM

## 2021-06-19 DIAGNOSIS — G4733 Obstructive sleep apnea (adult) (pediatric): Secondary | ICD-10-CM | POA: Diagnosis not present

## 2021-06-19 DIAGNOSIS — R569 Unspecified convulsions: Secondary | ICD-10-CM | POA: Diagnosis not present

## 2021-06-19 NOTE — Patient Instructions (Signed)
Seizure, Adult ?A seizure is a sudden burst of abnormal electrical and chemical activity in the brain. Seizures usually last from 30 seconds to 2 minutes.  ?What are the causes? ?Common causes of this condition include: ?Fever or infection. ?Problems that affect the brain. These may include: ?A brain or head injury. ?Bleeding in the brain. ?A brain tumor. ?Low levels of blood sugar or salt. ?Kidney problems or liver problems. ?Conditions that are passed from parent to child (are inherited). ?Problems with a substance, such as: ?Having a reaction to a drug or a medicine. ?Stopping the use of a substance all of a sudden (withdrawal). ?A stroke. ?Disorders that affect how you develop. ?Sometimes, the cause may not be known.  ?What increases the risk? ?Having someone in your family who has epilepsy. In this condition, seizures happen again and again over time. They have no clear cause. ?Having had a tonic-clonic seizure before. This type of seizure causes you to: ?Tighten the muscles of the whole body. ?Lose consciousness. ?Having had a head injury or strokes before. ?Having had a lack of oxygen at birth. ?What are the signs or symptoms? ?There are many types of seizures. The symptoms vary depending on the type of seizure you have. ?Symptoms during a seizure ?Shaking that you cannot control (convulsions) with fast, jerky movements of muscles. ?Stiffness of the body. ?Breathing problems. ?Feeling mixed up (confused). ?Staring or not responding to sound or touch. ?Head nodding. ?Eyes that blink, flutter, or move fast. ?Drooling, grunting, or making clicking sounds with your mouth ?Losing control of when you pee or poop. ?Symptoms before a seizure ?Feeling afraid, nervous, or worried. ?Feeling like you may vomit. ?Feeling like: ?You are moving when you are not. ?Things around you are moving when they are not. ?Feeling like you saw or heard something before (d?j? vu). ?Odd tastes or smells. ?Changes in how you see. You may  see flashing lights or spots. ?Symptoms after a seizure ?Feeling confused. ?Feeling sleepy. ?Headache. ?Sore muscles. ?How is this treated? ?If your seizure stops on its own, you will not need treatment. If your seizure lasts longer than 5 minutes, you will normally need treatment. Treatment may include: ?Medicines given through an IV tube. ?Avoiding things, such as medicines, that are known to cause your seizures. ?Medicines to prevent seizures. ?A device to prevent or control seizures. ?Surgery. ?A diet low in carbohydrates and high in fat (ketogenic diet). ?Follow these instructions at home: ?Medicines ?Take over-the-counter and prescription medicines only as told by your doctor. ?Avoid foods or drinks that may keep your medicine from working, such as alcohol. ?Activity ?Follow instructions about driving, swimming, or doing things that would be dangerous if you had another seizure. Wait until your doctor says it is safe for you to do these things. ?If you live in the [place] ask your local department of motor vehicles when you can drive. ?Get a lot of rest. ?Teaching others ? ?Teach friends and family what to do when you have a seizure. They should: ?Help you get down to the ground. ?Protect your head and body. ?Loosen any clothing around your neck. ?Turn you on your side. ?Know whether or not you need emergency care. ?Stay with you until you are better. ?Also, tell them what not to do if you have a seizure. Tell them: ?They should not hold you down. ?They should not put anything in your mouth. ?General instructions ?Avoid anything that gives you seizures. ?Keep a seizure diary. Write down: ?What you remember  about each seizure. ?What you think caused each seizure. ?Keep all follow-up visits. ?Contact a doctor if: ?You have another seizure or seizures. Call the doctor each time you have a seizure. ?The pattern of your seizures changes. ?You keep having seizures with treatment. ?You have symptoms of being sick or  having an infection. ?You are not able to take your medicine. ?Get help right away if: ?You have any of these problems: ?A seizure that lasts longer than 5 minutes. ?Many seizures in a row and you do not feel better between seizures. ?A seizure that makes it harder to breathe. ?A seizure and you can no longer speak or use part of your body. ?You do not wake up right after a seizure. ?You get hurt during a seizure. ?You feel confused or have pain right after a seizure. ?These symptoms may be an emergency. Get help right away. Call your local emergency services (911 in the [place] ?Do not wait to see if the symptoms will go away. ?Do not drive yourself to the hospital. ?Summary ?A seizure is a sudden burst of abnormal electrical and chemical activity in the brain. Seizures normally last from 30 seconds to 2 minutes. ?Causes of seizures include illness, injury to the head, low levels of blood sugar or salt, and certain conditions. ?Most seizures will stop on their own in less than 5 minutes. Seizures that last longer than 5 minutes are a medical emergency and need treatment right away. ?Many medicines are used to treat seizures. Take over-the-counter and prescription medicines only as told by your doctor. ?This information is not intended to replace advice given to you by your health care provider. Make sure you discuss any questions you have with your health care provider. ?Document Revised: 10/13/2019 Document Reviewed: 10/13/2019 ?Elsevier Patient Education ? [place]. ? ?

## 2021-06-19 NOTE — Progress Notes (Signed)
SLEEP MEDICINE CLINIC    Provider:  Larey Seat, MD  Primary Care Physician:  [name] Post, MD [place] [address]     Referring Provider: Garvin Fila, Md [place],  [place] [address]          Chief Complaint according to patient   Patient presents with:     New Patient (Initial Visit)     RM 10 with spouse. Internal referral from Dr. Leonie Man for OSA eval.       HISTORY OF PRESENT ILLNESS:  [name] [name] is a 81 y.o. right- handed  Caucasian male patient who seen here  in a sleep consultation   on 06/19/2021 from Dr Leonie Man, who he follows for nocturnal seizures, he once had 4 in one night when he was vacationing in [place]- was hospitalized there. Beginning in 10/ 2021 and had one last seizure January 2023. Dr Leonie Man had assumed that the initial event in [facility] in 2021 was a possible TIA related seizure.    Chief concern according to patient :    [name] [name] has a past medical history of Allergy, Arthritis, GERD (gastroesophageal reflux disease), HYPERLIPIDEMIA (07/17/2008), HYPERTENSION (07/17/2008), Nocturnal Seizures ([place]), Sinus node dysfunction ([place]) (01/27/2011), and Sleep apnea. 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    Sleep relevant medical history: Nocturia 3-5 times- Family medical /sleep history:  no hx; cousin with OSA, insomnia, sleep walkers.  Social history: retired  from [employer], camping and [employer]-   Patient is working as and lives in a household with spouse, one dog. Adult children. Tobacco use quit 39 years ago- ETOH use 2-3 beers a day-  Smoking pot,  Caffeine intake in form of Coffee( 3 cups a day). Regular exercise in form of  walking, pickle ball. .     Sleep habits are as follows: The patient's dinner time is between 5-7 PM. The patient goes to bed at 10-11 PM and continues to sleep for intervals of 2-3 hours , wakes for many,many  bathroom breaks,. He goes easily back to  sleep, bedroom is cool, quiet and dark.  The preferred sleep position is sideways, with the support of 1 pillow, bed is adjustable but kept flat. . Dreams are reportedly rare.  7.30  AM is the usual rise time. The patient wakes up spontaneously. He reports  feeling refreshed / restored in AM, but stiffness.. Naps are taken infrequently.   Review of Systems: Out of a complete 14 system review, the patient complains of only the following symptoms, and all other reviewed systems are negative.:  Fatigue, sleepiness , snoring, NOCTURIA fragmented sleep.   How likely are you to doze in the following situations: 0 = not likely, 1 = slight chance, 2 = moderate chance, 3 = high chance   Sitting and Reading? Watching Television? Sitting inactive in a public place (theater or meeting)? As a passenger in a car for an hour without a break? Lying down in the afternoon when circumstances permit? Sitting and talking to someone? Sitting quietly after lunch without alcohol? In a car, while stopped for a few minutes in traffic?   Total = 3/ 24 points   FSS endorsed at 20/ 63 points.  GDS: 1/ 15 points.   Social History   Socioeconomic History   Marital status: Married    Spouse name: [name] [name]   Number of children: 1   Years of education:  Not on file   Highest education level: Bachelor's degree (e.g., BA, AB, BS)  Occupational History   Occupation: CO-OWNER    Employer: [employer]  Tobacco Use   Smoking status: Former    Packs/day: 0.00    Types: Cigarettes    Quit date: 08/27/1982    Years since quitting: 38.8   Smokeless tobacco: Never  Vaping Use   Vaping Use: Never used  Substance and Sexual Activity   Alcohol use: Yes    Alcohol/week: 14.0 standard drinks    Types: 14 Standard drinks or equivalent per week    Comment: beer x 2 per day with meals/ may slow down    Drug use: Yes    Types: Marijuana    Comment: once per day   Sexual activity: Not on file  Other Topics Concern    Not on file  Social History Narrative   Lives with spouse   Right Handed   3-4 c caffeine    Social Determinants of Health   Financial Resource Strain: Not on file  Food Insecurity: Not on file  Transportation Needs: Not on file  Physical Activity: Not on file  Stress: Not on file  Social Connections: Not on file    Family History  Problem Relation Age of Onset   Hypertension Mother    Stroke Mother    Colitis Father    Hypertension Father    Other Father        esophageal stricture   Liver disease Father    Alcohol abuse Maternal Aunt    Rectal cancer Neg Hx    Stomach cancer Neg Hx    Esophageal cancer Neg Hx    Colon polyps Neg Hx    Colon cancer Neg Hx     Past Medical History:  Diagnosis Date   Allergy    Arthritis    GERD (gastroesophageal reflux disease)    HYPERLIPIDEMIA 07/17/2008   HYPERTENSION 07/17/2008   Seizures ([place])    12/11/2019   Sinus node dysfunction ([place]) 01/27/2011   a. s/p STJ dual chamber PPM    Sleep apnea    doesnt wear cpap    Past Surgical History:  Procedure Laterality Date   COLONOSCOPY  2016   PACEMAKER PLACEMENT     STJ dual chamber PPM implant 2011   POLYPECTOMY     SKIN CANCER EXCISION       Current Outpatient Medications on File Prior to Visit  Medication Sig Dispense Refill   Acetaminophen (TYLENOL ARTHRITIS PAIN PO) Take by mouth.     Ascorbic Acid (VITAMIN C PO) Take by mouth.     benazepril (LOTENSIN) 40 MG tablet TAKE 1 TABLET BY MOUTH EVERY DAY 90 tablet 2   cholecalciferol (VITAMIN D3) 25 MCG (1000 UNIT) tablet Take 1,000 Units by mouth daily.     Coenzyme Q10 (COQ10 PO) Take 1,000 mg by mouth.     levETIRAcetam (KEPPRA) 500 MG tablet Take one tablet in am and two tablets in pm. 270 tablet 3   Misc Natural Products (PROSTATE HEALTH PO) Take by mouth.     Omega-3 Fatty Acids (FISH OIL) 1000 MG CPDR Take 1 capsule by mouth every morning. 30 capsule 1   rosuvastatin (CRESTOR) 5 MG tablet TAKE 1 TABLET BY MOUTH  EVERY DAY 90 tablet 3   Turmeric (QC TUMERIC COMPLEX PO) Take by mouth.     No current facility-administered medications on file prior to visit.    No Known Allergies  Physical exam:  Today's Vitals   06/19/21 1253  BP: (!) 151/67  Pulse: 61  SpO2: 99%  Weight: 166 lb (75.3 kg)  Height: 5\' 7"  (1.702 m)   Body mass index is 26 kg/m.   Wt Readings from Last 3 Encounters:  06/19/21 166 lb (75.3 kg)  04/18/21 166 lb (75.3 kg)  04/17/21 165 lb (74.8 kg)     Ht Readings from Last 3 Encounters:  06/19/21 5\' 7"  (1.702 m)  04/18/21 5\' 7"  (1.702 m)  04/17/21 5\' 7"  (1.702 m)      General: The patient is awake, alert and appears not in acute distress. The patient is well groomed. Head: Normocephalic, atraumatic. Neck is supple.  Mallampati 1,  neck circumference:16 inches.  Nasal airflow patent.  Retrognathia is seen.  Dental status: biological  Cardiovascular:  Regular rate and cardiac rhythm by pulse,  without distended neck veins. Respiratory: Lungs are clear to auscultation.  Skin:  Without evidence of ankle edema, or rash. Trunk: The patient's posture is erect.   Neurologic exam : The patient is awake and alert, oriented to place and time.   Memory subjective described as intact.  Attention span & concentration ability appears normal.  Speech is fluent, without dysarthria, dysphonia or aphasia.  Mood and affect are appropriate.   Cranial nerves: no loss of smell or taste reported  Pupils are equal and briskly reactive to light. Funduscopic exam deferred..  Extraocular movements in vertical and horizontal planes were intact and without nystagmus. No Diplopia. Visual fields by finger perimetry are intact. Hearing with hearing aids - intact to soft voice and finger rubbing.  Non lateralizing rinne -weber.   Facial sensation intact to fine touch.  Facial motor strength is symmetric and tongue and uvula move midline.  Neck ROM : rotation, tilt and flexion extension were  normal for age and shoulder shrug was symmetrical.    Motor exam:  Symmetric bulk, tone and ROM.   Normal tone without cog wheeling, symmetric grip strength . Sensory:  Fine touch, pinprick and vibration were tested  and  normal.  Proprioception tested in the upper extremities was normal. Coordination: Rapid alternating movements in the fingers/hands were of normal speed.  The Finger-to-nose maneuver was intact without evidence of ataxia, dysmetria or tremor.  Gait and station: Patient could rise unassisted from a seated position, walked without assistive device.  Stance is of normal width/ base and the patient turned with 3 steps.  Toe and heel walk were deferred.  Deep tendon reflexes: in the upper and lower extremities are symmetric and intact.  Babinski response was deferred.       I reviewed Dr. Lenell Antu notes that the patient was visiting [place] on 12 Sep 2019 when he had a transient status [name] pacemaker insertion episode of confusion with brief loss of consciousness he woke up on that day with an episode of diarrhea and consequently subsequently he had a cup of coffee.  He remembers that he was sitting in a chair drinking coffee and his sister found him what looked to be asleep in the chair in an awkward position.  She tried to arouse him but he could not be aroused and then sounded confused his speech was garbled it did not make sense what he said.  EMS was called they found him arousable but still concerned that he may be having a stroke had 1 episode of vomiting by the time he reached the hospital he was again slightly disoriented but had  no focal deficits.  Alcohol test was negative urine drug screen was negative CT scan of the head shows some generalized cortical volume loss small vessel disease ventricles disproportionate enlarged to the degree of cortical atrophy CT angiogram was apparently recommended but we are not sure if it was done.  He also had a CT of the abdomen and pelvis which  showed right lower kidney pole mass suspicion for renal cancer cell carcinoma at the time.  So further work-up has not confirmed this diagnosis  After spending a total time of  45  minutes face to face and additional time for physical and neurologic examination, review of laboratory studies,  personal review of imaging studies, reports and results of other testing and review of referral information / records as far as provided in visit, I have established the following assessments:  1)  reportedly  mild OSA was dx over 20 years ago? No sure when and where- possible at the [facility] - he had not used the device more than a couple of days!  20 patient with a history of observed hypoxia in hospital stays, after procedures.  3) nocturnal seizures.    My Plan is to proceed with:  1) HST or attended sleep study-  I like to try for an attended sleep study in a patient with nocturnal seizures, potentially untreated OSA, and hypoxia.  He is using a pacemaker !   I would like to thank [name] Post, MD and Garvin Fila, Md [place],   [id number] for allowing me to meet with and to take care of this pleasant patient.   In short, TAEQUAN STOCKHAUSEN is presenting with nocturnal seizure.  I plan to follow up either personally or through our NP within 2-4 month.   Electronically signed by: Larey Seat, MD 06/19/2021 1:14 PM  [facility] [place] certified by The [employer] of Sleep Medicine and Diplomate of the [employer] of Sleep Medicine. Board certified In Neurology through the [place], Fellow of the [employer] of Neurology. Medical Director of [employer].

## 2021-06-23 ENCOUNTER — Telehealth: Payer: Self-pay

## 2021-06-23 ENCOUNTER — Ambulatory Visit (INDEPENDENT_AMBULATORY_CARE_PROVIDER_SITE_OTHER): Payer: Medicare Other | Admitting: Neurology

## 2021-06-23 ENCOUNTER — Other Ambulatory Visit: Payer: Self-pay

## 2021-06-23 DIAGNOSIS — Z950 Presence of cardiac pacemaker: Secondary | ICD-10-CM

## 2021-06-23 DIAGNOSIS — R569 Unspecified convulsions: Secondary | ICD-10-CM

## 2021-06-23 DIAGNOSIS — G4733 Obstructive sleep apnea (adult) (pediatric): Secondary | ICD-10-CM | POA: Diagnosis not present

## 2021-06-23 DIAGNOSIS — G459 Transient cerebral ischemic attack, unspecified: Secondary | ICD-10-CM

## 2021-06-23 NOTE — Telephone Encounter (Signed)
LVM for pt to call me back to schedule sleep study  

## 2021-06-24 ENCOUNTER — Other Ambulatory Visit: Payer: Self-pay | Admitting: *Deleted

## 2021-06-24 ENCOUNTER — Telehealth: Payer: Self-pay | Admitting: *Deleted

## 2021-06-24 DIAGNOSIS — R569 Unspecified convulsions: Secondary | ICD-10-CM | POA: Insufficient documentation

## 2021-06-24 DIAGNOSIS — G459 Transient cerebral ischemic attack, unspecified: Secondary | ICD-10-CM | POA: Insufficient documentation

## 2021-06-24 DIAGNOSIS — G4733 Obstructive sleep apnea (adult) (pediatric): Secondary | ICD-10-CM | POA: Insufficient documentation

## 2021-06-24 MED ORDER — TRAZODONE HCL 100 MG PO TABS
100.0000 mg | ORAL_TABLET | Freq: Every evening | ORAL | 0 refills | Status: DC | PRN
Start: 2021-06-24 — End: 2021-06-24

## 2021-06-24 MED ORDER — TRAZODONE HCL 100 MG PO TABS: 100.0000 mg | ORAL_TABLET | Freq: Every evening | ORAL | 0 refills | Status: DC | PRN

## 2021-06-24 NOTE — Procedures (Signed)
PATIENT'S NAME:  [name] [name], Istre ?DOB:      June 19, 1940      ?MR#:    [id number]     ?DATE OF RECORDING: 06/23/2021  [name] F.  ?REFERRING M.D.:  [name] Contras, MD ?Study Performed:   EEG with Polysomnogram, did not qualify for SPLIT.  ? ?HISTORY:  [name] [name] is a 81 y.o. right- handed Caucasian male stroke patient who has been seen  in a sleep consultation on 06/19/2021, requested by Dr Leonie [name], whom he follows for nocturnal seizures, he once had 4 seizures in one night while vacationing in [place] and was hospitalized there. Beginning of his seizure activity in 10/ 2021 ,he had one last seizure January 2023. Dr Leonie [name] had assumed that the initial event in [facility] in 2021 was a possible TIA related seizure.  ?The patient was once diagnosed with mild apnea - reportedly, hasn't used CPAP in several years.  While in hospital, he was told his oxygen sat was decreasing during sleep. He also has nocturnal seizures, making an attended PSG most helpful. He is neither daytime sleepy nor fatigued.   ?  ?[name] [name] has a past medical history of Allergy, Arthritis, GERD (gastroesophageal reflux disease), HYPERLIPIDEMIA (07/17/2008), HYPERTENSION (07/17/2008), Nocturnal Seizures ([place]), Sinus node dysfunction ([place]) (01/27/2011), and Sleep apnea. 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  ? ?The patient endorsed the Epworth Sleepiness Scale at 3 points.   ?The patient's weight 166 pounds with a height of 67 (inches), resulting in a BMI of 26. kg/m2. ?The patient's neck circumference measured 16 inches. ? ?CURRENT MEDICATIONS: Tylenol, Vitamin C, Lotensin, Vitamin D3, COQ10, Keppra, Prostate health, Fish oil, Crestor, Turmeric ?  ?PROCEDURE:  This is a multichannel digital polysomnogram utilizing the Somnostar 11.2 system.  Electrodes and sensors were applied and monitored per AASM Specifications.   EEG, EOG, Chin and Limb EMG, were sampled at 200 Hz.  ECG, Snore and Nasal Pressure, Thermal Airflow, Respiratory Effort, CPAP  Flow and Pressure, Oximetry was sampled at 50 Hz. Digital video and audio were recorded.     ? ?BASELINE STUDY: Lights Out was at 22:07 and Lights On at 04:58.  Total recording time (TRT) was 409 minutes, with a total sleep time (TST) of 174 minutes.    ?The patient's sleep latency was 60.5 minutes.  REM latency was 191 minutes.  The sleep efficiency was extremely poor at  42.5 %.  ?   ?SLEEP ARCHITECTURE: WASO (Wake after sleep onset) was 205.5 minutes.  There were 39 minutes in Stage N1, 128 minutes Stage N2, 0 minutes Stage N3 and 7 minutes in Stage REM.  The percentage of Stage N1 was 22.4%, Stage N2 was 73.6%, Stage N3 was 0% and Stage R (REM sleep) was 4.%.  ? ? ?RESPIRATORY ANALYSIS:  There were a total of 107 respiratory events:  41 obstructive apneas, 0 central apneas and 2 mixed apneas with 64 hypopneas -The patient also had 0 respiratory event related arousals (RERAs).  ?The total APNEA/HYPOPNEA INDEX (AHI) was 36.9/hour and the total RESPIRATORY DISTURBANCE INDEX was  36.9 /hour.  5 events occurred in REM sleep and 126 events in NREM. The REM AHI was  42.9 /hour, versus a non-REM AHI of 36.6.  ?The patient spent 174 minutes of total sleep time in the supine position. The supine AHI was 36.9 was there for the only positional AHI. ? ?OXYGEN SATURATION & C02:  The Wake baseline 02 saturation was 96%, with the lowest being 84%.  Time spent below 89% saturation equaled 10 minutes. ?  ?The arousals were noted as: 38 were spontaneous, 10 were associated with PLMs, 49 were associated with respiratory events. ?The patient had a total of 158 Periodic Limb Movements.  The Periodic Limb Movement (PLM) Arousal index was 3.4/hour. ?Audio and video analysis did not show any abnormal or unusual movements, behaviors, phonations or vocalizations.   ?EKG was in paced rhythm. ? ? ?IMPRESSION: The patient slept barely for half the recording time. Each time he went to sleep, he had significant apnea and PLMs.  ? ?Severe  Complex, mostly Obstructive Sleep Apnea (OSA). ?Severe Periodic Limb Movement Disorder (PLMD) ?Symmetrically slow sleep EEG.  ?Paced EKG. ? ? ? ?RECOMMENDATIONS: ? ?Apnea of this severity should be treated with PAP therapy. The patient will be asked to return for an attended, CPAP titration study to optimize therapy.   ?Sleep aid will be ordered for this patient, I will provide trazodone for him.  ? ? ?I certify that I have reviewed the entire raw data recording prior to the issuance of this report in accordance with the Standards of Accreditation of the [employer] of [employer] (AASM) ? ?Cc Dr Leonie Man, MD  ? ? ? ?Larey Seat, MD ?[place], [place] Board of Psychiatry and Neurology  ?Diplomat, Tax adviser of Sleep Medicine ?Medical Director, [facility] at [facility] ?

## 2021-06-24 NOTE — Addendum Note (Signed)
Addended by: Larey Seat on: 06/24/2021 01:16 PM ? ? Modules accepted: Orders ? ?

## 2021-06-24 NOTE — Progress Notes (Signed)
Remote pacemaker transmission.   

## 2021-06-24 NOTE — Progress Notes (Signed)
IMPRESSION: The patient slept barely for half the recording time. Each time he went to sleep, he had significant apnea and PLMs.  ?? ?1. Severe Complex, mostly Obstructive Sleep Apnea (OSA). ?2. Severe Periodic Limb Movement Disorder (PLMD) ?3. Symmetrically slow sleep EEG.  ?4. Paced EKG. ?? ?? ?RECOMMENDATIONS: ?? ?1. Apnea of this severity should be treated with PAP therapy. The patient will be asked to return for an attended, CPAP titration study to optimize therapy.   ?2. Sleep aid will be ordered for this patient, I will provide trazodone for him.  ??

## 2021-06-24 NOTE — Telephone Encounter (Signed)
-----   Message from Larey Seat, MD sent at 06/24/2021  1:16 PM EST ----- ?IMPRESSION: The patient slept barely for half the recording time. Each time he went to sleep, he had significant apnea and PLMs.  ?? ?1. Severe Complex, mostly Obstructive Sleep Apnea (OSA). ?2. Severe Periodic Limb Movement Disorder (PLMD) ?3. Symmetrically slow sleep EEG.  ?4. Paced EKG. ?? ?? ?RECOMMENDATIONS: ?? ?1. Apnea of this severity should be treated with PAP therapy. The patient will be asked to return for an attended, CPAP titration study to optimize therapy.   ?2. Sleep aid will be ordered for this patient, I will provide trazodone for him.  ?? ?

## 2021-06-30 NOTE — Progress Notes (Unsigned)
[place] Phone: [phone] Subjective:    I'm seeing this patient by the request  of:  [name], MD  CC:   [id number]  04/17/2021 Patient has had only 1 injection so far in the shoulder.  We discussed the possibility of another 1.  Patient declined it at the moment.  Discussed which activities to do which wants to avoid.  Increase activity slowly.  Did discuss potential compression.  Follow-up again in 2 months  Patient does have some arthritic changes noted.  Patient still does have pain on the anterior lateral aspect of the knee.  Does have a cortical irregularity noted of the tibia.  We discussed the possibility of a CT scan which patient declined.  Feels like he would not want any type of surgical intervention at this time.  We did discuss though if this cortical irregularity did seem to get bigger and need further imaging.  Patient did respond extremely well to the injection and hopefully will do well.  Patient will be out of state for the next 2 months.  We will follow-up with me after that.  Updated 07/01/2021 [name] [name] is a 81 y.o. male coming in with complaint of right knee and shoulder pain       Past Medical History:  Diagnosis Date   Allergy    Arthritis    GERD (gastroesophageal reflux disease)    HYPERLIPIDEMIA 07/17/2008   HYPERTENSION 07/17/2008   Seizures ([place])    12/11/2019   Sinus node dysfunction ([place]) 01/27/2011   a. s/p STJ dual chamber PPM    Sleep apnea    doesnt wear cpap   Past Surgical History:  Procedure Laterality Date   COLONOSCOPY  2016   PACEMAKER PLACEMENT     STJ dual chamber PPM implant 2011   POLYPECTOMY     SKIN CANCER EXCISION     Social History   Socioeconomic History   Marital status: Married    Spouse name: Peggy   Number of children: 1   Years of education: Not on file   Highest education level: Bachelor's degree (e.g., BA, AB, BS)   Occupational History   Occupation: CO-OWNER    Employer: [employer]  Tobacco Use   Smoking status: Former    Packs/day: 0.00    Types: Cigarettes    Quit date: 08/27/1982    Years since quitting: 38.8   Smokeless tobacco: Never  Vaping Use   Vaping Use: Never used  Substance and Sexual Activity   Alcohol use: Yes    Alcohol/week: 14.0 standard drinks    Types: 14 Standard drinks or equivalent per week    Comment: beer x 2 per day with meals/ may slow down    Drug use: Yes    Types: Marijuana    Comment: once per day   Sexual activity: Not on file  Other Topics Concern   Not on file  Social History Narrative   Lives with spouse   Right Handed   3-4 c caffeine    Social Determinants of Health   Financial Resource Strain: Not on file  Food Insecurity: Not on file  Transportation Needs: Not on file  Physical Activity: Not on file  Stress: Not on file  Social Connections: Not on file   No Known Allergies Family History  Problem Relation Age of Onset   Hypertension Mother    Stroke Mother    Colitis Father  Hypertension Father    Other Father        esophageal stricture   Liver disease Father    Alcohol abuse Maternal Aunt    Rectal cancer Neg Hx    Stomach cancer Neg Hx    Esophageal cancer Neg Hx    Colon polyps Neg Hx    Colon cancer Neg Hx      Current Outpatient Medications (Cardiovascular):    benazepril (LOTENSIN) 40 MG tablet, TAKE 1 TABLET BY MOUTH EVERY DAY   rosuvastatin (CRESTOR) 5 MG tablet, TAKE 1 TABLET BY MOUTH EVERY DAY   Current Outpatient Medications (Analgesics):    Acetaminophen (TYLENOL ARTHRITIS PAIN PO), Take by mouth.   Current Outpatient Medications (Other):    Ascorbic Acid (VITAMIN C PO), Take by mouth.   cholecalciferol (VITAMIN D3) 25 MCG (1000 UNIT) tablet, Take 1,000 Units by mouth daily.   Coenzyme Q10 (COQ10 PO), Take 1,000 mg by mouth.   levETIRAcetam (KEPPRA) 500 MG tablet, Take one tablet in am and two tablets  in pm.   Misc Natural Products (PROSTATE HEALTH PO), Take by mouth.   Omega-3 Fatty Acids (FISH OIL) 1000 MG CPDR, Take 1 capsule by mouth every morning.   traZODone (DESYREL) 100 MG tablet, Take 1 tablet (100 mg total) by mouth at bedtime as needed for sleep.   Turmeric (QC TUMERIC COMPLEX PO), Take by mouth.   Reviewed prior external information including notes and imaging from  primary care provider As well as notes that were available from care everywhere and other healthcare systems.  Past medical history, social, surgical and family history all reviewed in electronic medical record.  No pertanent information unless stated regarding to the chief complaint.   Review of Systems:  No headache, visual changes, nausea, vomiting, diarrhea, constipation, dizziness, abdominal pain, skin rash, fevers, chills, night sweats, weight loss, swollen lymph nodes, body aches, joint swelling, chest pain, shortness of breath, mood changes. POSITIVE muscle aches  Objective  There were no vitals taken for this visit.   General: No apparent distress alert and oriented x3 mood and affect normal, dressed appropriately.  HEENT: Pupils equal, extraocular movements intact  Respiratory: Patient's speak in full sentences and does not appear short of breath  Cardiovascular: No lower extremity edema, non tender, no erythema  Gait normal with good balance and coordination.  MSK:  Non tender with full range of motion and good stability and symmetric strength and tone of shoulders, elbows, wrist, hip, knee and ankles bilaterally.     Impression and Recommendations:     The above documentation has been reviewed and is accurate and complete D[place]a Sale

## 2021-07-01 ENCOUNTER — Ambulatory Visit: Payer: Medicare Other | Admitting: Family Medicine

## 2021-07-01 ENCOUNTER — Ambulatory Visit: Payer: Self-pay

## 2021-07-01 ENCOUNTER — Encounter: Payer: Self-pay | Admitting: Family Medicine

## 2021-07-01 ENCOUNTER — Other Ambulatory Visit: Payer: Self-pay

## 2021-07-01 VITALS — BP 138/68 | HR 68 | Ht 67.0 in | Wt 167.0 lb

## 2021-07-01 DIAGNOSIS — M75101 Unspecified rotator cuff tear or rupture of right shoulder, not specified as traumatic: Secondary | ICD-10-CM

## 2021-07-01 DIAGNOSIS — S83241A Other tear of medial meniscus, current injury, right knee, initial encounter: Secondary | ICD-10-CM | POA: Diagnosis not present

## 2021-07-01 DIAGNOSIS — M25561 Pain in right knee: Secondary | ICD-10-CM

## 2021-07-01 DIAGNOSIS — M12811 Other specific arthropathies, not elsewhere classified, right shoulder: Secondary | ICD-10-CM | POA: Diagnosis not present

## 2021-07-01 NOTE — Assessment & Plan Note (Signed)
Chronic problem with exacerbation.  Patient did have swelling on the anterior aspect of the shoulder and will consider the possibility of injecting in that area with aspiration on next follow-up if necessary.  Discussed icing regimen and home exercises.  Discussed which activities to do and which ones to avoid.  Increase activity slowly.  Follow-up again in 6 to 8 weeks. ?

## 2021-07-01 NOTE — Assessment & Plan Note (Signed)
Patient does have a tear of the medial meniscus but I do think patient also has more significant arthritis than patient's x-rays show at the moment.  Discussed with patient about icing regimen and home exercises, which activities to do and which ones to avoid.  Increase activity slowly.  Follow-up again in 6 to 8 weeks. ?

## 2021-07-01 NOTE — Patient Instructions (Addendum)
Injected shoulder today ?If keeps giving [place] trouble consider draining bicep tendon ?Monitor knee otherwise looking at replacement ?See me in 5-6 weeks if shoulder is not better, otherwise see me in 10-12 weeks ? ?

## 2021-07-02 ENCOUNTER — Telehealth: Payer: Self-pay | Admitting: Neurology

## 2021-07-02 ENCOUNTER — Ambulatory Visit (INDEPENDENT_AMBULATORY_CARE_PROVIDER_SITE_OTHER): Payer: Medicare Other | Admitting: Neurology

## 2021-07-02 DIAGNOSIS — G4700 Insomnia, unspecified: Secondary | ICD-10-CM

## 2021-07-02 DIAGNOSIS — G459 Transient cerebral ischemic attack, unspecified: Secondary | ICD-10-CM

## 2021-07-02 DIAGNOSIS — G4733 Obstructive sleep apnea (adult) (pediatric): Secondary | ICD-10-CM | POA: Diagnosis not present

## 2021-07-02 DIAGNOSIS — G4731 Primary central sleep apnea: Secondary | ICD-10-CM

## 2021-07-02 DIAGNOSIS — R569 Unspecified convulsions: Secondary | ICD-10-CM

## 2021-07-02 DIAGNOSIS — G4761 Periodic limb movement disorder: Secondary | ICD-10-CM

## 2021-07-02 DIAGNOSIS — Z950 Presence of cardiac pacemaker: Secondary | ICD-10-CM

## 2021-07-02 MED ORDER — TRAZODONE HCL 100 MG PO TABS: 100.0000 mg | ORAL_TABLET | Freq: Every evening | ORAL | 0 refills | Status: DC | PRN

## 2021-07-02 NOTE — Telephone Encounter (Signed)
Called the patient back. We have never sent a script to a MD office it would go to the pharmacy. Per an old phone note in regards to his [place] another nurse had spoke with someone at the [facility] and was advised that only his PCP at the [place] can write the meds to their [place].  ?Pt is in transition to the new MD and they have not seen him yet. I advised I will be happy to forward updated notes to the MD that way that have that information.  ?At this time the pt will have [place] [facility] and he will pay for it and he will discuss going forward how to get refills through the [place] when he meets with Dr Mina Marble.  ? ?

## 2021-07-02 NOTE — Telephone Encounter (Signed)
Pt has called to report that the traZODone (DESYREL) 100 MG tablet  should not have been called [facility] the [facility].  It should have been called into his PCP Dr Addison Lank ph [phone] fax [phone]  ?

## 2021-07-09 DIAGNOSIS — G4700 Insomnia, unspecified: Secondary | ICD-10-CM | POA: Insufficient documentation

## 2021-07-09 DIAGNOSIS — G4731 Primary central sleep apnea: Secondary | ICD-10-CM | POA: Insufficient documentation

## 2021-07-09 DIAGNOSIS — G4761 Periodic limb movement disorder: Secondary | ICD-10-CM | POA: Insufficient documentation

## 2021-07-09 NOTE — Progress Notes (Signed)
DIAGNOSIS ?1. Complex Sleep Apnea  ?2. Periodic Limb Movement Disorder- break-through at arousals as respiratory events were controlled   ?3. Insomnia (in spite of sleep aid being ordered for him? Did he take it?)  ?4. abnormal ( paced) EKG ?? ?PLANS/RECOMMENDATIONS: ?1. CPAP at 7 cm water pressure with a Medium Vitera full face mask worked well for this patient.   ?2. CPAP therapy compliance is defined as a minimum of 4 hours of daily use. ?3. Severe PLM disorder with frequent arousals.  ?? ??DISCUSSION: Start autotitration CPAP, auto CPAP by ResMed can be set to 6-10 cm water, 1 cm EPR, heated humidification and a medium sized Vitera full face mask (FFM). Treat PLMs as RLS, lowest dose of dopaminergic agonist.

## 2021-07-09 NOTE — Procedures (Signed)
PATIENT'S NAME:  [name] [name], [name] [name] ?DOB:      02/03/1941      ?MR#:    [id number]     ?DATE OF RECORDING: 07/02/2021 M. Randall ?REFERRING M.D.:  Antony Contras, MD ?Study Performed:   Titration to positive airway pressure.  ?HISTORY:  [name] is a 81 y.o. right- handed stroke team patient who has been seen in a sleep consultation on 06/19/2021, requested by Dr Leonie Man, whom he follows for nocturnal seizures He once had 4 seizures in one night while vacationing in [place] and was hospitalized there. ?Beginning of his seizure activity in 10/ 2021, last seizure January 2023. Dr Leonie Man had assumed that the initial event in [facility] in 2021 was a possible TIA related seizure.  ?The patient was once diagnosed with mild apnea - reportedly, hasn't used CPAP in several years.  While in hospital, he was told his oxygen saturations were abnormal during sleep.  ?He also has nocturnal seizures, making an attended PSG most helpful. He is neither daytime sleepy nor fatigued. ? ?Diagnostic polysomnogram performed on 06/23/2021 revealed: ?APNEA/HYPOPNEA INDEX (AHI) was 36.9/hour.  The Wake baseline 02 saturation was 96%, with the lowest being 84%.  ?Time spent below 89% saturation equaled 10 minutes.                                                         ?The arousals were noted as: 38 were spontaneous, 10 were associated with PLMs, 49 were associated with respiratory events. ?The patient had a total of 158 Periodic Limb Movements.  The Periodic Limb Movement (PLM) Arousal index was 3.4/hour. The patient slept barely for half the recording time. Each time he went to sleep, he had significant apnea and PLMs.  ?  ?1.        Severe Complex, mostly Obstructive Sleep Apnea (OSA). ?2.        Severe Periodic Limb Movement Disorder (PLMD) ?3.        Symmetrically slow sleep EEG.  ?4.        Paced EKG. ?   ?RECOMMENDATIONS: ?  Apnea of this severity should be treated with PAP therapy. The patient will be asked to return for an attended, CPAP titration  study to optimize therapy.  Sleep aid will be ordered for this patient, I will provide trazodone for him. ?The patient's weight 166 pounds with a height of 67 (inches), resulting in a BMI of 26. kg/m2. ?The patient's neck circumference measured 16 inches. ? ?CURRENT MEDICATIONS: Tylenol, Vitamin C, Lotensin, Vitamin D3, COQ10, Keppra, Prostate health, Fish oil, Crestor, Turmeric ? ?  ?PROCEDURE:  This is a multichannel digital polysomnogram utilizing the SomnoStar 11.2 system.  Electrodes and sensors were applied and monitored per AASM Specifications.   EEG, EOG, Chin and Limb EMG, were sampled at 200 Hz.  ECG, Snore and Nasal Pressure, Thermal Airflow, Respiratory Effort, CPAP Flow and Pressure, Oximetry was sampled at 50 Hz. Digital video and audio were recorded.     ? ?CPAP was initiated at 5 cmH20 with heated humidity per AASM split night standards and pressure was advanced to 7 cmH20 because of inability to initiate sleep-.  At a PAP pressure of 7 cmH20, there was a reduction of the AHI to 0.9/h with improvement of sleep apnea. ?Lights Out was at 21:02 and  Lights On at 04:54. Total recording time (TRT) was 473 minutes, with a total sleep time (TST) of 213 minutes. The patient's sleep latency was 198 minutes. ? No REM sleep- 0 minutes.  The sleep efficiency was 45.1 %.   ? ?SLEEP ARCHITECTURE: WASO (Wake after sleep onset) was 146 minutes.  There were 14 minutes in Stage N1, 197.5 minutes Stage N2, 1.5 minutes Stage N3 and 0 minutes in Stage REM.  The percentage of Stage N1 was 6.6%, Stage N2 was 92.7%, Stage N3 was .7% and Stage R (REM sleep) was 0%.  ?The arousals were noted as: 26 were spontaneous, 32 were associated with PLMs, 2 were associated with respiratory events. ? ?RESPIRATORY ANALYSIS:  There was a total of 3 respiratory events: 0 obstructive apneas, 2 central apneas and 0 mixed apneas with a total of 2 apneas and an apnea index (AI) of0 .6 /hour. There was only 1 hypopnea with a hypopnea index of  0.3/hour.  ?The total APNEA/HYPOPNEA INDEX (AHI) was 0.8 /hour.  0 events occurred in REM sleep and 3 events in NREM. The REM AHI was 0 /hour versus a non-REM AHI of .8 /hour.  The patient spent 124.5 minutes of total sleep time in the supine position and 89 minutes in non-supine. The supine AHI was 1.5, versus a non-supine AHI of 0.0. ?OXYGEN SATURATION & C02:  The baseline 02 saturation was 96%, with the lowest being 89%. Time spent below 89% saturation equaled 0 minutes. ? ?PERIODIC LIMB MOVEMENTS:  The patient had a total of 362 Periodic Limb Movements. The PLM Arousal index was 9.0 /hour. ? ?EKG with variable intervals between QRS complexes. Paced. ?The patient was fitted  ? ?DIAGNOSIS ?Complex Sleep Apnea  ?Periodic Limb Movement Disorder- break-through at arousals as respiratory events were controlled   ?Insomnia (in spite of sleep aid being ordered for him? Did he take it?)  ?abnormal EKG ? ?PLANS/RECOMMENDATIONS: ?CPAP at 7 cm water pressure with a Medium Vitera full face mask worked well for this patient.   ?CPAP therapy compliance is defined as a minimum of 4 hours of daily use. ?Severe PLM disorder with arousals  ? ? ? ? ? DISCUSSION: Start autotitration CPAP, auto CPAP by ResMed can be set to 6-10 cm water, 1 cm EPR, heated humidification and a medium sized Vitera full face mask (FFM). ?Treat PLMs as RLS, lowest dose of dopaminergic agonist.  ? ? ?A follow up appointment will be scheduled in the Sleep Clinic at [facility].   Please call DME with any machine or mask related questions.  ? ?I certify that I have reviewed the entire raw data recording prior to the issuance of this report in accordance with the Standards of Accreditation of the [place] [employer] (AASM) ? ? ? ?Larey Seat, M.D. ?Diplomat, Tax adviser of Psychiatry and Neurology  ?Diplomat, Tax adviser of Sleep Medicine ?Medical Director, [facility] ? ? ?

## 2021-07-09 NOTE — Patient Instructions (Signed)
Insomnia ?Insomnia is a sleep disorder that makes it difficult to fall asleep or stay asleep. Insomnia can cause fatigue, low energy, difficulty concentrating, mood swings, and poor performance at work or school. ?There are three different ways to classify insomnia: ?Difficulty falling asleep. ?Difficulty staying asleep. ?Waking up too early in the morning. ?Any type of insomnia can be long-term (chronic) or short-term (acute). Both are common. Short-term insomnia usually lasts for three months or less. Chronic insomnia occurs at least three times a week for longer than three months. ?What are the causes? ?Insomnia may be caused by another condition, situation, or substance, such as: ?Anxiety. ?Certain medicines. ?Gastroesophageal reflux disease (GERD) or other gastrointestinal conditions. ?Asthma or other breathing conditions. ?Restless legs syndrome, sleep apnea, or other sleep disorders. ?Chronic pain. ?Menopause. ?Stroke. ?Abuse of alcohol, tobacco, or illegal drugs. ?Mental health conditions, such as depression. ?Caffeine. ?Neurological disorders, such as Alzheimer's disease. ?An overactive thyroid (hyperthyroidism). ?Sometimes, the cause of insomnia may not be known. ?What increases the risk? ?Risk factors for insomnia include: ?Gender. Women are affected more often than men. ?Age. Insomnia is more common as you get older. ?Stress. ?Lack of exercise. ?Irregular work schedule or working night shifts. ?Traveling between different time zones. ?Certain medical and mental health conditions. ?What are the signs or symptoms? ?If you have insomnia, the main symptom is having trouble falling asleep or having trouble staying asleep. This may lead to other symptoms, such as: ?Feeling fatigued or having low energy. ?Feeling nervous about going to sleep. ?Not feeling rested in the morning. ?Having trouble concentrating. ?Feeling irritable, anxious, or depressed. ?How is this diagnosed? ?This condition may be diagnosed  based on: ?Your symptoms and medical history. Your health care provider may ask about: ?Your sleep habits. ?Any medical conditions you have. ?Your mental health. ?A physical exam. ?How is this treated? ?Treatment for insomnia depends on the cause. Treatment may focus on treating an underlying condition that is causing insomnia. Treatment may also include: ?Medicines to help you sleep. ?Counseling or therapy. ?Lifestyle adjustments to help you sleep better. ?Follow these instructions at home: ?Eating and drinking ? ?Limit or avoid alcohol, caffeinated beverages, and cigarettes, especially close to bedtime. These can disrupt your sleep. ?Do not eat a large meal or eat spicy foods right before bedtime. This can lead to digestive discomfort that can make it hard for you to sleep. ?Sleep habits ? ?Keep a sleep diary to help you and your health care provider figure out what could be causing your insomnia. Write down: ?When you sleep. ?When you wake up during the night. ?How well you sleep. ?How rested you feel the next day. ?Any side effects of medicines you are taking. ?What you eat and drink. ?Make your bedroom a dark, comfortable place where it is easy to fall asleep. ?Put up shades or blackout curtains to block light from outside. ?Use a white noise machine to block noise. ?Keep the temperature cool. ?Limit screen use before bedtime. This includes: ?Watching TV. ?Using your smartphone, tablet, or computer. ?Stick to a routine that includes going to bed and waking up at the same times every day and night. This can help you fall asleep faster. Consider making a quiet activity, such as reading, part of your nighttime routine. ?Try to avoid taking naps during the day so that you sleep better at night. ?Get out of bed if you are still awake after 15 minutes of trying to sleep. Keep the lights down, but try reading or doing a   quiet activity. When you feel sleepy, go back to bed. ?General instructions ?Take over-the-counter  and prescription medicines only as told by your health care provider. ?Exercise regularly, as told by your health care provider. Avoid exercise starting several hours before bedtime. ?Use relaxation techniques to manage stress. Ask your health care provider to suggest some techniques that may work well for you. These may include: ?Breathing exercises. ?Routines to release muscle tension. ?Visualizing peaceful scenes. ?Make sure that you drive carefully. Avoid driving if you feel very sleepy. ?Keep all follow-up visits as told by your health care provider. This is important. ?Contact a health care provider if: ?You are tired throughout the day. ?You have trouble in your daily routine due to sleepiness. ?You continue to have sleep problems, or your sleep problems get worse. ?Get help right away if: ?You have serious thoughts about hurting yourself or someone else. ?If you ever feel like you may hurt yourself or others, or have thoughts about taking your own life, get help right away. You can go to your nearest emergency department or call: ?Your local emergency services (911 in the [place] ?A suicide crisis helpline, such as the [facility] at 786-695-8340 or 988 in the [place]. This is open 24 hours a day. ?Summary ?Insomnia is a sleep disorder that makes it difficult to fall asleep or stay asleep. ?Insomnia can be long-term (chronic) or short-term (acute). ?Treatment for insomnia depends on the cause. Treatment may focus on treating an underlying condition that is causing insomnia. ?Keep a sleep diary to help you and your health care provider figure out what could be causing your insomnia. ?This information is not intended to replace advice given to you by your health care provider. Make sure you discuss any questions you have with your health care provider. ?Document Revised: 10/30/2020 Document Reviewed: 02/15/2020 ?Elsevier Patient Education ? [place]. ?Trazodone Tablets ?What is  this medication? ?TRAZODONE (TRAZ oh done) treats depression. It increases the amount of serotonin in the brain, a hormone that helps regulate mood. ?This medicine may be used for other purposes; ask your health care provider or pharmacist if you have questions. ?COMMON BRAND NAME(S): Desyrel ?What should I tell my care team before I take this medication? ?They need to know if you have any of these conditions: ?Attempted suicide or thinking about it ?Bipolar disorder ?Bleeding problems ?Glaucoma ?Heart disease, or previous heart attack ?Irregular heart beat ?Kidney or liver disease ?Low levels of sodium in the blood ?An unusual or allergic reaction to trazodone, other medications, foods, dyes or preservatives ?Pregnant or trying to get pregnant ?Breast-feeding ?How should I use this medication? ?Take this medication by mouth with a glass of water. Follow the directions on the prescription label. Take this medication shortly after a meal or a light snack. Take your medication at regular intervals. Do not take your medication more often than directed. Do not stop taking this medication suddenly except upon the advice of your care team. Stopping this medication too quickly may cause serious side effects or your condition may worsen. ?A special MedGuide will be given to you by the pharmacist with each prescription and refill. Be sure to read this information carefully each time. ?Talk to your care team regarding the use of this medication in children. Special care may be needed. ?Overdosage: If you think you have taken too much of this medicine contact a poison control center or emergency room at once. ?NOTE: This medicine is only for you. Do  not share this medicine with others. ?What if I miss a dose? ?If you miss a dose, take it as soon as you can. If it is almost time for your next dose, take only that dose. Do not take double or extra doses. ?What may interact with this medication? ?Do not take this medication with  any of the following: ?Certain medications for fungal infections like fluconazole, itraconazole, ketoconazole, posaconazole, voriconazole ?Cisapride ?Dronedarone ?Linezolid ?MAOIs like Carbex, Eldepryl, Ma

## 2021-07-09 NOTE — Addendum Note (Signed)
Addended by: Larey Seat on: 07/09/2021 05:36 PM ? ? Modules accepted: Orders ? ?

## 2021-07-10 ENCOUNTER — Telehealth: Payer: Self-pay | Admitting: Neurology

## 2021-07-10 NOTE — Telephone Encounter (Signed)
Pt called back and I had missed his call. ? ?Called the pt back and it went to VM. LVM again. I will send a mychart message.  ?

## 2021-07-10 NOTE — Telephone Encounter (Signed)
-----   Message from Larey Seat, MD sent at 07/09/2021  5:32 PM EDT ----- ?DIAGNOSIS ?1. Complex Sleep Apnea  ?2. Periodic Limb Movement Disorder- break-through at arousals as respiratory events were controlled   ?3. Insomnia (in spite of sleep aid being ordered for him? Did he take it?)  ?4. abnormal ( paced) EKG ?? ?PLANS/RECOMMENDATIONS: ?1. CPAP at 7 cm water pressure with a Medium Vitera full face mask worked well for this patient.   ?2. CPAP therapy compliance is defined as a minimum of 4 hours of daily use. ?3. Severe PLM disorder with frequent arousals.  ?? ??DISCUSSION: Start autotitration CPAP, auto CPAP by ResMed can be set to 6-10 cm water, 1 cm EPR, heated humidification and a medium sized Vitera full face mask (FFM). Treat PLMs as RLS, lowest dose of dopaminergic agonist.  ?

## 2021-07-10 NOTE — Telephone Encounter (Signed)
Called patient to discuss sleep study results. No answer at this time. LVM for the patient to call back.   

## 2021-07-14 ENCOUNTER — Encounter: Payer: Self-pay | Admitting: *Deleted

## 2021-07-14 NOTE — Telephone Encounter (Signed)
I called pt. I advised pt that Dr. Brett Fairy reviewed their sleep study results and found that pt has complex sleep apnea. Dr. Brett Fairy recommends that pt get set up w/ auto cpap. I reviewed PAP compliance expectations with the pt. Pt is agreeable to starting a CPAP. I advised pt that an order will be sent to a DME, [facility], and [facility] will call the pt within about one week after they file with the pt's insurance. [facility] will show the pt how to use the machine, fit for masks, and troubleshoot the CPAP if needed. A follow up appt was made for insurance purposes with Dr. Brett Fairy on 10/13/21 at 9:30am. Pt verbalized understanding to arrive 15 minutes early and bring their CPAP. A letter with all of this information in it will be mailed to the pt as a reminder. I verified with the pt that the address we have on file is correct. Pt verbalized understanding of results. Pt had no questions at this time but was encouraged to call back if questions arise. I have sent the order to [facility] and have received confirmation that they have received the order. ? ?

## 2021-07-21 ENCOUNTER — Ambulatory Visit (INDEPENDENT_AMBULATORY_CARE_PROVIDER_SITE_OTHER): Payer: Self-pay

## 2021-07-21 DIAGNOSIS — I495 Sick sinus syndrome: Secondary | ICD-10-CM

## 2021-07-21 LAB — CUP PACEART REMOTE DEVICE CHECK
Battery Remaining Longevity: 8 mo
Battery Remaining Percentage: 5 %
Battery Voltage: 2.75 V
Brady Statistic AP VP Percent: 1 %
Brady Statistic AP VS Percent: 4.5 %
Brady Statistic AS VP Percent: 1 %
Brady Statistic AS VS Percent: 95 %
Brady Statistic RA Percent Paced: 4.2 %
Brady Statistic RV Percent Paced: 1 %
Date Time Interrogation Session: 20230403072843
Implantable Lead Implant Date: 20111010
Implantable Lead Implant Date: 20111010
Implantable Lead Location: 753859
Implantable Lead Location: 753860
Implantable Pulse Generator Implant Date: 20111010
Lead Channel Impedance Value: 280 Ohm
Lead Channel Impedance Value: 300 Ohm
Lead Channel Pacing Threshold Amplitude: 0.625 V
Lead Channel Pacing Threshold Amplitude: 1.25 V
Lead Channel Pacing Threshold Pulse Width: 0.4 ms
Lead Channel Pacing Threshold Pulse Width: 0.4 ms
Lead Channel Sensing Intrinsic Amplitude: 2.3 mV
Lead Channel Sensing Intrinsic Amplitude: 4.6 mV
Lead Channel Setting Pacing Amplitude: 1.5 V
Lead Channel Setting Pacing Amplitude: 1.625
Lead Channel Setting Pacing Pulse Width: 0.4 ms
Lead Channel Setting Sensing Sensitivity: 2 mV
Pulse Gen Model: 2110
Pulse Gen Serial Number: 7156802

## 2021-07-23 ENCOUNTER — Ambulatory Visit: Payer: Medicare HMO | Admitting: Neurology

## 2021-07-31 ENCOUNTER — Telehealth: Payer: Self-pay | Admitting: Neurology

## 2021-07-31 NOTE — Telephone Encounter (Signed)
Pt states he called and spoke with representative from [facility] at [phone] this afternoon 07/31/2021 about order for his CPAP machine. [facility] says they do not have orders from [place]. Pt would like a call back.  ?

## 2021-07-31 NOTE — Telephone Encounter (Signed)
Received feedback from [facility] ([facility])Hey Jinny Blossom!  ? ?I have checked all of our history of orders, for this name, and I do not see where this was sent to [place].  ? ?I have added Marysa Caryl Pina is on vaction) to pull and process asap.  ? ?Thanks!  ?Margreta Journey   ?If pt calls back please advise [facility] is processing and should be in touch soon. ?

## 2021-07-31 NOTE — Telephone Encounter (Addendum)
I have sent message to [facility] on this checking status per message on 07/10/21 order was sent. Pt updated as well of this information via vm.  ? ?Phone room can relay if pt calls back. Updates will be placed as received.  ?

## 2021-08-01 NOTE — Progress Notes (Signed)
Remote pacemaker transmission.   

## 2021-08-05 ENCOUNTER — Ambulatory Visit: Payer: Medicare Other | Admitting: Family Medicine

## 2021-08-21 ENCOUNTER — Ambulatory Visit (INDEPENDENT_AMBULATORY_CARE_PROVIDER_SITE_OTHER): Payer: Self-pay

## 2021-08-21 DIAGNOSIS — I495 Sick sinus syndrome: Secondary | ICD-10-CM

## 2021-08-21 DIAGNOSIS — G4733 Obstructive sleep apnea (adult) (pediatric): Secondary | ICD-10-CM | POA: Diagnosis not present

## 2021-08-21 LAB — CUP PACEART REMOTE DEVICE CHECK
Battery Remaining Longevity: 7 mo
Battery Remaining Percentage: 5 %
Battery Voltage: 2.74 V
Brady Statistic AP VP Percent: 1 %
Brady Statistic AP VS Percent: 5.1 %
Brady Statistic AS VP Percent: 1 %
Brady Statistic AS VS Percent: 95 %
Brady Statistic RA Percent Paced: 4.7 %
Brady Statistic RV Percent Paced: 1 %
Date Time Interrogation Session: 20230504072720
Implantable Lead Implant Date: 20111010
Implantable Lead Implant Date: 20111010
Implantable Lead Location: 753859
Implantable Lead Location: 753860
Implantable Pulse Generator Implant Date: 20111010
Lead Channel Impedance Value: 300 Ohm
Lead Channel Impedance Value: 340 Ohm
Lead Channel Pacing Threshold Amplitude: 0.625 V
Lead Channel Pacing Threshold Amplitude: 1.25 V
Lead Channel Pacing Threshold Pulse Width: 0.4 ms
Lead Channel Pacing Threshold Pulse Width: 0.4 ms
Lead Channel Sensing Intrinsic Amplitude: 2.7 mV
Lead Channel Sensing Intrinsic Amplitude: 7.2 mV
Lead Channel Setting Pacing Amplitude: 1.5 V
Lead Channel Setting Pacing Amplitude: 1.625
Lead Channel Setting Pacing Pulse Width: 0.4 ms
Lead Channel Setting Sensing Sensitivity: 2 mV
Pulse Gen Model: 2110
Pulse Gen Serial Number: 7156802

## 2021-08-22 ENCOUNTER — Other Ambulatory Visit: Payer: Self-pay | Admitting: Neurology

## 2021-08-25 ENCOUNTER — Telehealth: Payer: Self-pay | Admitting: Neurology

## 2021-08-25 MED ORDER — TRAZODONE HCL 100 MG PO TABS: 100.0000 mg | ORAL_TABLET | Freq: Every evening | ORAL | 1 refills | Status: DC | PRN

## 2021-08-25 NOTE — Telephone Encounter (Signed)
Rescheduled 5/15 appt with pt over the phone- Dr. Leonie Man out. ?

## 2021-08-28 ENCOUNTER — Encounter: Payer: Self-pay | Admitting: Neurology

## 2021-09-01 ENCOUNTER — Ambulatory Visit: Payer: Medicare HMO | Admitting: Neurology

## 2021-09-03 DIAGNOSIS — R569 Unspecified convulsions: Secondary | ICD-10-CM | POA: Diagnosis not present

## 2021-09-03 NOTE — Progress Notes (Signed)
Remote pacemaker transmission.   

## 2021-09-08 DIAGNOSIS — H905 Unspecified sensorineural hearing loss: Secondary | ICD-10-CM | POA: Insufficient documentation

## 2021-09-16 DIAGNOSIS — L814 Other melanin hyperpigmentation: Secondary | ICD-10-CM | POA: Diagnosis not present

## 2021-09-16 DIAGNOSIS — Z85828 Personal history of other malignant neoplasm of skin: Secondary | ICD-10-CM | POA: Diagnosis not present

## 2021-09-16 DIAGNOSIS — D1801 Hemangioma of skin and subcutaneous tissue: Secondary | ICD-10-CM | POA: Diagnosis not present

## 2021-09-16 DIAGNOSIS — L570 Actinic keratosis: Secondary | ICD-10-CM | POA: Diagnosis not present

## 2021-09-16 DIAGNOSIS — C4441 Basal cell carcinoma of skin of scalp and neck: Secondary | ICD-10-CM | POA: Diagnosis not present

## 2021-09-16 DIAGNOSIS — D485 Neoplasm of uncertain behavior of skin: Secondary | ICD-10-CM | POA: Diagnosis not present

## 2021-09-16 DIAGNOSIS — L821 Other seborrheic keratosis: Secondary | ICD-10-CM | POA: Diagnosis not present

## 2021-09-16 DIAGNOSIS — B351 Tinea unguium: Secondary | ICD-10-CM | POA: Diagnosis not present

## 2021-09-18 ENCOUNTER — Ambulatory Visit: Payer: Medicare Other | Admitting: Neurology

## 2021-09-18 ENCOUNTER — Encounter: Payer: Self-pay | Admitting: Neurology

## 2021-09-18 VITALS — BP 131/73 | HR 71 | Ht 67.0 in | Wt 160.0 lb

## 2021-09-18 DIAGNOSIS — G3184 Mild cognitive impairment, so stated: Secondary | ICD-10-CM

## 2021-09-18 DIAGNOSIS — G40009 Localization-related (focal) (partial) idiopathic epilepsy and epileptic syndromes with seizures of localized onset, not intractable, without status epilepticus: Secondary | ICD-10-CM | POA: Diagnosis not present

## 2021-09-18 NOTE — Patient Instructions (Signed)
I had a long discussion with the patient and his wife regarding his seizures which presently appear well controlled on the current medication regimen of Keppra 500 mg in the morning and 1000 mg at night.    I again asked him to avoid seizure provoking stimuli like sleep deprivation, medication noncompliance, extremes of activity and dietary changes.  I also advised him to increase participation in cognitively challenging activities like solving crossword puzzles, playing bridge, sudoku and word searches for his mild cognitive impairment which also appears stable.  He also discussed memory compensation strategies.  He will return for follow-up in the future in 6 months or call earlier if necessary.  Memory Compensation Strategies  Use "WARM" strategy.  W= write it down  A= associate it  R= repeat it  M= make a mental note  2.   You can keep a Memory Notebook.  Use a 3-ring notebook with sections for the following: calendar, important names and phone numbers,  medications, doctors' names/phone numbers, lists/reminders, and a section to journal what you did  each day.   3.    Use a calendar to write appointments down.  4.    Write yourself a schedule for the day.  This can be placed on the calendar or in a separate section of the Memory Notebook.  Keeping a  regular schedule can help memory.  5.    Use medication organizer with sections for each day or morning/evening pills.  You may need help loading it  6.    Keep a basket, or pegboard by the door.  Place items that you need to take out with you in the basket or on the pegboard.  You may also want to  include a message board for reminders.  7.    Use sticky notes.  Place sticky notes with reminders in a place where the task is performed.  For example: " turn off the  stove" placed by the stove, "lock the door" placed on the door at eye level, " take your medications" on  the bathroom mirror or by the place where you normally take your  medications.  8.    Use alarms/timers.  Use while cooking to remind yourself to check on food or as a reminder to take your medicine, or as a  reminder to make a call, or as a reminder to perform another task, etc.  

## 2021-09-18 NOTE — Progress Notes (Signed)
[facility] [address] [place]. [place] [address] [phone]       OFFICE FOLLOW UP VISIT NOTE  [name] Date of Birth:  11-04-1940 Medical Record Number:  [id number]   Referring MD: Carolann Littler  Reason for Referral: Loss of consciousness episode  JIR:CVELFYB visit 10/03/2019 ; [name] [name] is a pleasant 81 year old Caucasian male seen today for initial office consultation visit.  Is accompanied by his wife.  History is obtained from him and review of referral notes and imaging results though actual films were not available at the time of this visit.  His past medical history of hypertension, hyperlipidemia, sinus node dysfunction status post pacemaker insertion who was visiting [place] when on 09/12/2019 he had a transient episode of confusion with brief loss of consciousness.  He states that he woke up on that day with an episode of diarrhea and went to the bathroom and then subsequently made himself a cup of coffee.  Last thing he remembers is sitting in a chair and drinking coffee.  Reportedly his sister got up and found him asleep on the chair and in awkward position.  She tried to arouse him but he did not wake up and sounded confused and his speech was garbled and did not make sense.  EMS were called who found him to be arousable but he did not have any focal deficits to raise concern for stroke.  He had one episode of vomiting.  By the time he reached the hospital he was slightly disoriented and confused but had no focal deficits.  His lab work was pretty unremarkable urine drug screen was negative.  Alcohol test was negative.  CT scan of the head showed generalized cortical volume loss with changes of small vessel disease and slight enlargement of the ventricles disproportionate to the degree of atrophy as per radiology report.  CT angiogram of brain and neck was recommended and the neurologist consultation but I do not have   copy of the report she is CT scan  of the abdomen pelvis showed right lower kidney pole mass suspicious for renal cell carcinoma.  Carotid ultrasound showed no significant extracranial stenosis.  EEG was not done.  Patient states he still does not remember all the details of the episode.  He has a pacemaker since 2010 which is MRI incompatible hence it was not done.  Patient states is felt fine since his come here has had no further episodes of loss of consciousness.  He denies any prior history of seizures, significant head injury with loss of consciousness strokes or TIAs.  He does have a long-term mild memory difficulties which are mostly short-term.  These are not progressive as per him and his wife.  He does have a family history of dementia however.  He denies symptoms suggestive of normal pressure hydrocephalus in the form of progressive gait and balance difficulties, bladder incontinence or progressive memory loss. Update 11/13/2019 : He returns for follow-up after last visit 6 weeks ago.  Is accompanied by his wife.  Patient states has not had any seizure-like episode however about a month ago he had a minor spell when he was sitting in the porch outside his house when his wife noticed him to be slightly confused and disoriented.  He had trouble remembering the names of one of the neighbors.  This lasted barely a few minutes and returned back to baseline.  He denies any accompanying headache, tongue bite, injury or incontinence.  The patient continues  to have short-term memory difficulties which are unchanged.  He often misplaces his phone.  He is not participating in any cognitively challenging activities.  Is tolerating aspirin well without bruising or bleeding.  He takes fish oil every day.  He has no new complaints.  He did have EEG done on 11/01/2019 which was normal. Update 01/23/2020 : He is seen today for urgent follow-up visit following recent ER visit to [facility] on 12/11/2019 for possibly unwitnessed seizure.  Patient's  wife woke up in the middle of the night with the patient unresponsive but having tonic activity of both upper extremities which were raised about his body.  He had drooling from his mouth.  He was not answering questions.  She called 911.  Patient was found to be gradually waking up and does not remember till he was in the ER.  He was thought to have an unwitnessed seizure with postictal state and started on Keppra.  Patient seems to be tolerating find a milligrams twice daily well without any side effects.  He has had no further episodes of unresponsiveness or seizure-like activity.  He has not been driving.  He has no family tree of seizures.  States his short-term memory difficulties are unchanged and are not getting worse.  He has no new complaints. Update 07/23/2020 : He returns for follow-up after last visit 6 months ago.  Is accompanied by his wife.  Patient states is done well.  He has no recurrent seizures.  Is tolerating Keppra and the current dose of 500 mg twice daily well without any side effects.  He did have a follow-up EEG on 03/19/2020 which was normal.  He has started driving and has had no issues.  He has no new complaints today. Update 02/20/2021: Patient is seen today upon request as he had some breakthrough seizures.  He was vacationing in [place] when on 01/30/2021 he had 3 back-to-back seizures at home and 1 later at the hospital.  The wife was eyewitness describes a seizure was typical with the patient drawing up his left arm and becoming stiff and unresponsive.  Seizure activity lasted for minutes.  He was quite confused postictally and restless and agitated.  He had to be hospitalized for 5 days and treated with antibiotics for his UTI.  He was hallucinating.  He was continued on Keppra home dose of 5 mg twice daily.  He states is done well since then and had no further breakthrough seizures.  Patient admits that he had a hectic travel and had probably been sleep deprived prior to his  seizure breakthrough.  He continues to have mild short-term memory difficulties but these appear to be unchanged and not progressive.  He does play solitaire and does some cognitive challenging activities.  He has no new complaints today. Update 09/18/2021 : He returns for follow-up after last visit 6 months ago.  He states he is doing well.  He has had no further seizures since increasing the dose of Keppra to 500 mg in the morning and 1 gram at night.  He is tolerating the increased dose without any side effects.  He has also started using CPAP for his sleep apnea and is sleeping a lot better.  He was seen at [facility] for his seizures for a second opinion and saw Dr. Virgia Land on 09/03/2021 who added Nayzilam 5 mg nasal spray but agreed with current medication regimen of Keppra.  He continues to have mild short-term memory difficulties which are  unchanged and not progressive and do not interfere with his day-to-day activities.  He does do daily puzzles.  He has no new complaints. ROS:   14 system review of systems is positive for  , seizure memory difficulties,  , confusion, disorientation and all other systems negative PMH:  Past Medical History:  Diagnosis Date   Allergy    Arthritis    GERD (gastroesophageal reflux disease)    HYPERLIPIDEMIA 07/17/2008   HYPERTENSION 07/17/2008   Seizures ([place])    12/11/2019   Sinus node dysfunction ([place]) 01/27/2011   a. s/p STJ dual chamber PPM    Sleep apnea    doesnt wear cpap    Social History:  Social History   Socioeconomic History   Marital status: Married    Spouse name: Peggy   Number of children: 1   Years of education: Not on file   Highest education level: Bachelor's degree (e.g., BA, AB, BS)  Occupational History   Occupation: CO-OWNER    Employer: [employer]  Tobacco Use   Smoking status: Former    Packs/day: 0.00    Types: Cigarettes    Quit date: 08/27/1982    Years since quitting: 39.0   Smokeless tobacco: Never  Vaping  Use   Vaping Use: Never used  Substance and Sexual Activity   Alcohol use: Yes    Alcohol/week: 14.0 standard drinks    Types: 14 Standard drinks or equivalent per week    Comment: beer x 2 per day with meals/ may slow down    Drug use: Yes    Types: Marijuana    Comment: once per day   Sexual activity: Not on file  Other Topics Concern   Not on file  Social History Narrative   Lives with spouse   Right Handed   3-4 c caffeine    Social Determinants of Health   Financial Resource Strain: Not on file  Food Insecurity: Not on file  Transportation Needs: Not on file  Physical Activity: Not on file  Stress: Not on file  Social Connections: Not on file  Intimate Partner Violence: Not At Risk   Fear of Current or Ex-Partner: No   Emotionally Abused: No   Physically Abused: No   Sexually Abused: No    Medications:   Current Outpatient Medications on File Prior to Visit  Medication Sig Dispense Refill   Acetaminophen (TYLENOL ARTHRITIS PAIN PO) Take by mouth.     Ascorbic Acid (VITAMIN C PO) Take by mouth.     benazepril (LOTENSIN) 40 MG tablet TAKE 1 TABLET BY MOUTH EVERY DAY 90 tablet 2   cholecalciferol (VITAMIN D3) 25 MCG (1000 UNIT) tablet Take 1,000 Units by mouth daily.     Coenzyme Q10 (COQ10 PO) Take 1,000 mg by mouth.     levETIRAcetam (KEPPRA) 500 MG tablet Take one tablet in am and two tablets in pm. 270 tablet 3   Misc Natural Products (PROSTATE HEALTH PO) Take by mouth.     Omega-3 Fatty Acids (FISH OIL) 1000 MG CPDR Take 1 capsule by mouth every morning. 30 capsule 1   rosuvastatin (CRESTOR) 5 MG tablet TAKE 1 TABLET BY MOUTH EVERY DAY 90 tablet 3   traZODone (DESYREL) 100 MG tablet Take 1 tablet (100 mg total) by mouth at bedtime as needed for sleep. 90 tablet 1   Turmeric (QC TUMERIC COMPLEX PO) Take by mouth.     No current facility-administered medications on file prior to visit.  Allergies:  No Known Allergies  Physical Exam General: well developed,  well nourished mildly obese elderly Caucasian male, seated, in no evident distress Head: head normocephalic and atraumatic.   Neck: supple with no carotid or supraclavicular bruits Cardiovascular: regular rate and rhythm, no murmurs Musculoskeletal: no deformity.  Wearing a right knee brace Skin:  no rash/petichiae Vascular:  Normal pulses all extremities  Neurologic Exam Mental Status: Awake and fully alert. Oriented to place and time. Recent and remote memory intact. Attention span, concentration and fund of knowledge appropriate. Mood and affect appropriate.  Recall 3/3.  Able to name 12 animals which can walk on 4 legs.  Clock drawing 4/4. Cranial Nerves: Fundoscopic exam not done. Pupils equal, briskly reactive to light. Extraocular movements full without nystagmus. Visual fields full to confrontation. Hearing slightly diminished bilaterally despite hearing aids.. Facial sensation intact. Face, tongue, palate moves normally and symmetrically.  Motor: Normal bulk and tone. Normal strength in all tested extremity muscles. Sensory.: intact to touch , pinprick , position and vibratory sensation.  Coordination: Rapid alternating movements normal in all extremities. Finger-to-nose and heel-to-shin performed accurately bilaterally. Gait and Station: Arises from chair without difficulty. Stance is normal. gait demonstrates normal stride length and balance . Able to heel, toe and tandem walk with only slight difficulty.  Reflexes: 1+ and symmetric. Toes downgoing.     ASSESSMENT: 81 year old Caucasian male with transient episode of loss of consciousness followed by some confusion and disorientation  X 2 now likely unwitnessed seizure with postictal confusion he has had recent breakthrough seizures last month while vacationing in [place] likely related to urinary tract infection and sleep deprivation.  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  He may consider using Nayzilam nasal spray for prolonged seizure lasting more than 5 minutes or repetitive seizures.  I again asked him to avoid seizure provoking stimuli like sleep deprivation, medication noncompliance, extremes of activity and dietary changes.  I also advised him to increase participation in cognitively challenging activities like solving crossword puzzles, playing bridge, sudoku and word searches for his mild cognitive impairment which also appears stable.  He also discussed memory compensation strategies.  He will return for follow-up in the future in 6 months or call earlier if necessary. Greater than 50% time during this 35 minute visit was spent on counseling and coordination of care about his seizures and answering questions.  Antony Contras, MD  [facility] [address] [place], [place] [address]  Phone [phone] Fax [phone] Note: This document was prepared with digital dictation and possible smart phrase technology. Any transcriptional errors that result from this process are unintentional.

## 2021-09-19 DIAGNOSIS — D3001 Benign neoplasm of right kidney: Secondary | ICD-10-CM | POA: Diagnosis not present

## 2021-09-21 DIAGNOSIS — G4733 Obstructive sleep apnea (adult) (pediatric): Secondary | ICD-10-CM | POA: Diagnosis not present

## 2021-09-22 ENCOUNTER — Ambulatory Visit (INDEPENDENT_AMBULATORY_CARE_PROVIDER_SITE_OTHER): Payer: Self-pay

## 2021-09-22 DIAGNOSIS — I495 Sick sinus syndrome: Secondary | ICD-10-CM

## 2021-09-22 LAB — CUP PACEART REMOTE DEVICE CHECK
Battery Remaining Longevity: 7 mo
Battery Remaining Percentage: 5 %
Battery Voltage: 2.74 V
Brady Statistic AP VP Percent: 1 %
Brady Statistic AP VS Percent: 5.9 %
Brady Statistic AS VP Percent: 1 %
Brady Statistic AS VS Percent: 94 %
Brady Statistic RA Percent Paced: 5.5 %
Brady Statistic RV Percent Paced: 1 %
Date Time Interrogation Session: 20230605070308
Implantable Lead Implant Date: 20111010
Implantable Lead Implant Date: 20111010
Implantable Lead Location: 753859
Implantable Lead Location: 753860
Implantable Pulse Generator Implant Date: 20111010
Lead Channel Impedance Value: 260 Ohm
Lead Channel Impedance Value: 330 Ohm
Lead Channel Pacing Threshold Amplitude: 0.625 V
Lead Channel Pacing Threshold Amplitude: 1.25 V
Lead Channel Pacing Threshold Pulse Width: 0.4 ms
Lead Channel Pacing Threshold Pulse Width: 0.4 ms
Lead Channel Sensing Intrinsic Amplitude: 2.6 mV
Lead Channel Sensing Intrinsic Amplitude: 5 mV
Lead Channel Setting Pacing Amplitude: 1.5 V
Lead Channel Setting Pacing Amplitude: 1.625
Lead Channel Setting Pacing Pulse Width: 0.4 ms
Lead Channel Setting Sensing Sensitivity: 2 mV
Pulse Gen Model: 2110
Pulse Gen Serial Number: 7156802

## 2021-09-29 DIAGNOSIS — C4442 Squamous cell carcinoma of skin of scalp and neck: Secondary | ICD-10-CM | POA: Diagnosis not present

## 2021-09-30 ENCOUNTER — Ambulatory Visit: Payer: Medicare Other | Admitting: Family Medicine

## 2021-10-08 ENCOUNTER — Encounter: Payer: Self-pay | Admitting: Adult Health

## 2021-10-08 NOTE — Progress Notes (Signed)
Remote pacemaker transmission.   

## 2021-10-09 DIAGNOSIS — R6889 Other general symptoms and signs: Secondary | ICD-10-CM | POA: Diagnosis not present

## 2021-10-13 ENCOUNTER — Encounter: Payer: Self-pay | Admitting: Neurology

## 2021-10-13 ENCOUNTER — Ambulatory Visit: Payer: Medicare Other | Admitting: Neurology

## 2021-10-13 VITALS — BP 141/59 | HR 53 | Ht 67.0 in | Wt 157.0 lb

## 2021-10-13 DIAGNOSIS — Z9989 Dependence on other enabling machines and devices: Secondary | ICD-10-CM | POA: Diagnosis not present

## 2021-10-13 DIAGNOSIS — G4731 Primary central sleep apnea: Secondary | ICD-10-CM

## 2021-10-13 MED ORDER — TRAZODONE HCL 100 MG PO TABS: 100.0000 mg | ORAL_TABLET | Freq: Every evening | ORAL | 1 refills | Status: DC | PRN

## 2021-10-21 DIAGNOSIS — G4733 Obstructive sleep apnea (adult) (pediatric): Secondary | ICD-10-CM | POA: Diagnosis not present

## 2021-10-23 ENCOUNTER — Ambulatory Visit (INDEPENDENT_AMBULATORY_CARE_PROVIDER_SITE_OTHER): Payer: Self-pay

## 2021-10-23 DIAGNOSIS — I495 Sick sinus syndrome: Secondary | ICD-10-CM

## 2021-10-23 LAB — CUP PACEART REMOTE DEVICE CHECK
Battery Remaining Longevity: 6 mo
Battery Remaining Percentage: 4 %
Battery Voltage: 2.72 V
Brady Statistic AP VP Percent: 1 %
Brady Statistic AP VS Percent: 6.4 %
Brady Statistic AS VP Percent: 1 %
Brady Statistic AS VS Percent: 94 %
Brady Statistic RA Percent Paced: 6 %
Brady Statistic RV Percent Paced: 1 %
Date Time Interrogation Session: 20230706073417
Implantable Lead Implant Date: 20111010
Implantable Lead Implant Date: 20111010
Implantable Lead Location: 753859
Implantable Lead Location: 753860
Implantable Pulse Generator Implant Date: 20111010
Lead Channel Impedance Value: 290 Ohm
Lead Channel Impedance Value: 330 Ohm
Lead Channel Pacing Threshold Amplitude: 0.75 V
Lead Channel Pacing Threshold Amplitude: 1.25 V
Lead Channel Pacing Threshold Pulse Width: 0.4 ms
Lead Channel Pacing Threshold Pulse Width: 0.4 ms
Lead Channel Sensing Intrinsic Amplitude: 2.3 mV
Lead Channel Sensing Intrinsic Amplitude: 4.2 mV
Lead Channel Setting Pacing Amplitude: 1.5 V
Lead Channel Setting Pacing Amplitude: 1.75 V
Lead Channel Setting Pacing Pulse Width: 0.4 ms
Lead Channel Setting Sensing Sensitivity: 2 mV
Pulse Gen Model: 2110
Pulse Gen Serial Number: 7156802

## 2021-10-29 ENCOUNTER — Telehealth: Payer: Self-pay | Admitting: Internal Medicine

## 2021-10-29 NOTE — Telephone Encounter (Signed)
Successful telephone encounter to patient to advise of battery life on most recent remote transmission 10/23/21. Advised patient he continues to have 6 months longevity and would be continued to be monitored monthly. Patient appreciative of call. He is made aware that gen change would not be scheduled until device reaches RRT/ERI.

## 2021-10-29 NOTE — Telephone Encounter (Signed)
  1. Has your device fired? no  2. Is you device beeping? no  3. Are you experiencing draining or swelling at device site? no  4. Are you calling to see if we received your device transmission? no  5. Have you passed out? no  Patient calling for transmission results and to find out his battery life.    Please route to [place]

## 2021-11-10 NOTE — Progress Notes (Signed)
Remote pacemaker transmission.   

## 2021-11-21 DIAGNOSIS — G4733 Obstructive sleep apnea (adult) (pediatric): Secondary | ICD-10-CM | POA: Diagnosis not present

## 2021-11-22 DIAGNOSIS — G4733 Obstructive sleep apnea (adult) (pediatric): Secondary | ICD-10-CM | POA: Diagnosis not present

## 2021-11-24 ENCOUNTER — Ambulatory Visit (INDEPENDENT_AMBULATORY_CARE_PROVIDER_SITE_OTHER): Payer: Self-pay

## 2021-11-24 DIAGNOSIS — I495 Sick sinus syndrome: Secondary | ICD-10-CM

## 2021-11-25 LAB — CUP PACEART REMOTE DEVICE CHECK
Battery Remaining Longevity: 5 mo
Battery Remaining Percentage: 3 %
Battery Voltage: 2.71 V
Brady Statistic AP VP Percent: 1 %
Brady Statistic AP VS Percent: 6.7 %
Brady Statistic AS VP Percent: 1 %
Brady Statistic AS VS Percent: 93 %
Brady Statistic RA Percent Paced: 6.2 %
Brady Statistic RV Percent Paced: 1 %
Date Time Interrogation Session: 20230807075908
Implantable Lead Implant Date: 20111010
Implantable Lead Implant Date: 20111010
Implantable Lead Location: 753859
Implantable Lead Location: 753860
Implantable Pulse Generator Implant Date: 20111010
Lead Channel Impedance Value: 290 Ohm
Lead Channel Impedance Value: 330 Ohm
Lead Channel Pacing Threshold Amplitude: 0.75 V
Lead Channel Pacing Threshold Amplitude: 1.25 V
Lead Channel Pacing Threshold Pulse Width: 0.4 ms
Lead Channel Pacing Threshold Pulse Width: 0.4 ms
Lead Channel Sensing Intrinsic Amplitude: 2.3 mV
Lead Channel Sensing Intrinsic Amplitude: 5 mV
Lead Channel Setting Pacing Amplitude: 1.5 V
Lead Channel Setting Pacing Amplitude: 1.75 V
Lead Channel Setting Pacing Pulse Width: 0.4 ms
Lead Channel Setting Sensing Sensitivity: 2 mV
Pulse Gen Model: 2110
Pulse Gen Serial Number: 7156802

## 2021-12-02 ENCOUNTER — Ambulatory Visit: Payer: Medicare Other | Admitting: Internal Medicine

## 2021-12-02 ENCOUNTER — Encounter: Payer: Self-pay | Admitting: Internal Medicine

## 2021-12-02 VITALS — BP 118/60 | HR 97 | Ht 67.0 in | Wt 154.4 lb

## 2021-12-02 DIAGNOSIS — R55 Syncope and collapse: Secondary | ICD-10-CM | POA: Diagnosis not present

## 2021-12-02 DIAGNOSIS — Z950 Presence of cardiac pacemaker: Secondary | ICD-10-CM

## 2021-12-02 DIAGNOSIS — I495 Sick sinus syndrome: Secondary | ICD-10-CM | POA: Diagnosis not present

## 2021-12-02 NOTE — Progress Notes (Signed)
Patient ID: [name] [name], male   DOB: 11-22-1940, 81 y.o.   MRN: [id number]       Patient Care Team: [name] Post, MD as PCP - General (Family Medicine)   HPI  ISSAK [name] is a 81 y.o. male Seen in followup for recurrent syncope associated with demonstrated sinus node dysfunction by implantable loop recorder. He is status [name] pacemaker St Jude implantation 10/11   He has sold the [place]  Today, the patient denies chest pain, shortness of breath, nocturnal dyspnea, orthopnea or peripheral edema.  There have been no palpitations, lightheadedness or syncope.    No syncope episodes .  As noted below, episodes of tonic activity unresponsiveness and "postictal confusion ".  And was thought to have had a seizure disorder started on Keppra.  EEG was negative.  The patient denies chest pain, shortness of breath, nocturnal dyspnea, orthopnea or peripheral edema.  There have been no palpitations, lightheadedness or syncope.    He is physically active playing pickle ball and goes to them gym    Retired from the [employer] after 40 years  Date   Cr              K         Hgb  3/21 0.83 4.9       13.9 (8/21) 13.6  8/21  0.99 4.2                   Past Medical History:  Diagnosis Date   Allergy    Arthritis    GERD (gastroesophageal reflux disease)    HYPERLIPIDEMIA 07/17/2008   HYPERTENSION 07/17/2008   Seizures ([place])    12/11/2019   Sinus node dysfunction ([place]) 01/27/2011   a. s/p STJ dual chamber PPM    Sleep apnea    doesnt wear cpap    Past Surgical History:  Procedure Laterality Date   COLONOSCOPY  2016   PACEMAKER PLACEMENT     STJ dual chamber PPM implant 2011   POLYPECTOMY     SKIN CANCER EXCISION      Current Outpatient Medications  Medication Sig Dispense Refill   Acetaminophen (TYLENOL ARTHRITIS PAIN PO) Take by mouth.     Ascorbic Acid (VITAMIN C PO) Take by mouth.     benazepril (LOTENSIN) 40 MG tablet TAKE 1 TABLET BY MOUTH EVERY DAY 90 tablet 2    cholecalciferol (VITAMIN D3) 25 MCG (1000 UNIT) tablet Take 1,000 Units by mouth daily.     Coenzyme Q10 (COQ10 PO) Take 1,000 mg by mouth.     levETIRAcetam (KEPPRA) 500 MG tablet Take one tablet in am and two tablets in pm. 270 tablet 3   Omega-3 Fatty Acids (FISH OIL) 1000 MG CPDR Take 1 capsule by mouth every morning. 30 capsule 1   rosuvastatin (CRESTOR) 5 MG tablet TAKE 1 TABLET BY MOUTH EVERY DAY 90 tablet 3   traZODone (DESYREL) 100 MG tablet Take 1 tablet (100 mg total) by mouth at bedtime as needed for sleep. 90 tablet 1   Turmeric (QC TUMERIC COMPLEX PO) Take by mouth.     No current facility-administered medications for this visit.    No Known Allergies  Review of Systems negative except from HPI and PMH  Physical Exam: BP 118/60   Pulse 97   Ht '5\' 7"'$  (1.702 m)   Wt 154 lb 6.4 oz (70 kg)   SpO2 97%   BMI 24.18 kg/m   Well developed and well  nourished in no acute distress HENT normal Neck supple with JVP-flat Clear Device pocket well healed; without hematoma or erythema.  There is no tethering  Regular rate and rhythm, no  gallop No  murmur Abd-soft with active BS No Clubbing cyanosis  edema Skin-warm and dry A & Oriented  Grossly normal sensory and motor function  ECG sinus at 60 Intervals 18/10/38  Assessment and  Plan  Sinus node dysfunction  Syncope  Pacemaker-St. Jude    Hypertension  Seizure disorder  No interval syncope.  Device is approaching ERI, we will plan to see him in 6 months.  Some atrial pacing.  Has had repeated episodes of loss of consciousness, 10/21 events were associated with tonic activity, EEGs were negative but was presumed seizures and he was started on Keppra.  No events in the last 6 months.   Blood pressure is well controlled on Lotensin.

## 2021-12-02 NOTE — Patient Instructions (Addendum)
Medication Instructions:  Your physician recommends that you continue on your current medications as directed. Please refer to the Current Medication list given to you today.  *If you need a refill on your cardiac medications before your next appointment, please call your pharmacy*   Lab Work: None ordered.  If you have labs (blood work) drawn today and your tests are completely normal, you will receive your results only by: [place] (if you have MyChart) OR A paper copy in the mail If you have any lab test that is abnormal or we need to change your treatment, we will call you to review the results.   Testing/Procedures: None ordered.    Follow-Up: At [facility], you and your health needs are our priority.  As part of our continuing mission to provide you with exceptional heart care, we have created designated Provider Care Teams.  These Care Teams include your primary Cardiologist (physician) and Advanced Practice Providers (APPs -  Physician Assistants and Nurse Practitioners) who all work together to provide you with the care you need, when you need it.  We recommend signing up for the patient portal called "MyChart".  Sign up information is provided on this After Visit Summary.  MyChart is used to connect with patients for Virtual Visits (Telemedicine).  Patients are able to view lab/test results, encounter notes, upcoming appointments, etc.  Non-urgent messages can be sent to your provider as well.   To learn more about what you can do with MyChart, go to [email or web address].    Your next appointment:   6 months with Dr Olin Pia PA  Important Information About Sugar

## 2021-12-22 DIAGNOSIS — G4733 Obstructive sleep apnea (adult) (pediatric): Secondary | ICD-10-CM | POA: Diagnosis not present

## 2021-12-24 NOTE — Addendum Note (Signed)
Addended by: Douglass Rivers D on: 12/24/2021 03:14 PM   Modules accepted: Level of Service

## 2021-12-24 NOTE — Progress Notes (Signed)
Remote pacemaker transmission.   

## 2021-12-25 ENCOUNTER — Ambulatory Visit (INDEPENDENT_AMBULATORY_CARE_PROVIDER_SITE_OTHER): Payer: Medicare Other

## 2021-12-25 DIAGNOSIS — I495 Sick sinus syndrome: Secondary | ICD-10-CM

## 2021-12-25 LAB — CUP PACEART REMOTE DEVICE CHECK
Battery Remaining Longevity: 5 mo
Battery Remaining Percentage: 3 %
Battery Voltage: 2.71 V
Brady Statistic AP VP Percent: 1 %
Brady Statistic AP VS Percent: 8.5 %
Brady Statistic AS VP Percent: 1 %
Brady Statistic AS VS Percent: 91 %
Brady Statistic RA Percent Paced: 7.9 %
Brady Statistic RV Percent Paced: 1 %
Date Time Interrogation Session: 20230907082436
Implantable Lead Implant Date: 20111010
Implantable Lead Implant Date: 20111010
Implantable Lead Location: 753859
Implantable Lead Location: 753860
Implantable Pulse Generator Implant Date: 20111010
Lead Channel Impedance Value: 280 Ohm
Lead Channel Impedance Value: 310 Ohm
Lead Channel Pacing Threshold Amplitude: 0.75 V
Lead Channel Pacing Threshold Amplitude: 1.25 V
Lead Channel Pacing Threshold Pulse Width: 0.4 ms
Lead Channel Pacing Threshold Pulse Width: 0.4 ms
Lead Channel Sensing Intrinsic Amplitude: 2.7 mV
Lead Channel Sensing Intrinsic Amplitude: 5.4 mV
Lead Channel Setting Pacing Amplitude: 1.5 V
Lead Channel Setting Pacing Amplitude: 1.75 V
Lead Channel Setting Pacing Pulse Width: 0.4 ms
Lead Channel Setting Sensing Sensitivity: 2 mV
Pulse Gen Model: 2110
Pulse Gen Serial Number: 7156802

## 2022-01-10 NOTE — Progress Notes (Signed)
Remote pacemaker transmission.   

## 2022-01-21 DIAGNOSIS — G4733 Obstructive sleep apnea (adult) (pediatric): Secondary | ICD-10-CM | POA: Diagnosis not present

## 2022-01-26 ENCOUNTER — Ambulatory Visit (INDEPENDENT_AMBULATORY_CARE_PROVIDER_SITE_OTHER): Payer: Self-pay

## 2022-01-26 DIAGNOSIS — I495 Sick sinus syndrome: Secondary | ICD-10-CM

## 2022-01-26 NOTE — Progress Notes (Unsigned)
[facility]ach [facility] [place] [address] [place] Phone: [phone] Subjective:   IVilma Meckel, am serving as a scribe for Dr. Hulan Saas.  I'm seeing this patient by the request  of:  [name], MD  CC: Shoulder pain follow-up  OFB:[id number]  04/17/2021 Patient has had only 1 injection so far in the shoulder.  We discussed the possibility of another 1.  Patient declined it at the moment.  Discussed which activities to do which wants to avoid.  Increase activity slowly.  Did discuss potential compression.  Follow-up again in 2 months  Patient does have some arthritic changes noted.  Patient still does have pain on the anterior lateral aspect of the knee.  Does have a cortical irregularity noted of the tibia.  We discussed the possibility of a CT scan which patient declined.  Feels like he would not want any type of surgical intervention at this time.  We did discuss though if this cortical irregularity did seem to get bigger and need further imaging.  Patient did respond extremely well to the injection and hopefully will do well.  Patient will be out of state for the next 2 months.  We will follow-up with me after that.  Updated 01/28/2022 VUE PAVON is a 81 y.o. male coming in with complaint of R shoulder pain. Shoulder pain is about the same. Thinks he may want an injection. Also wants to know if he can get a new brace for his right knee.       Past Medical History:  Diagnosis Date   Allergy    Arthritis    GERD (gastroesophageal reflux disease)    HYPERLIPIDEMIA 07/17/2008   HYPERTENSION 07/17/2008   Seizures ([place])    12/11/2019   Sinus node dysfunction ([place]) 01/27/2011   a. s/p STJ dual chamber PPM    Sleep apnea    doesnt wear cpap   Past Surgical History:  Procedure Laterality Date   COLONOSCOPY  2016   PACEMAKER PLACEMENT     STJ dual chamber PPM implant 2011   POLYPECTOMY     SKIN CANCER EXCISION     Social  History   Socioeconomic History   Marital status: Married    Spouse name: Peggy   Number of children: 1   Years of education: Not on file   Highest education level: Bachelor's degree (e.g., BA, AB, BS)  Occupational History   Occupation: CO-OWNER    Employer: [employer]  Tobacco Use   Smoking status: Former    Packs/day: 0.00    Types: Cigarettes    Quit date: 08/27/1982    Years since quitting: 39.4   Smokeless tobacco: Never  Vaping Use   Vaping Use: Never used  Substance and Sexual Activity   Alcohol use: Yes    Alcohol/week: 14.0 standard drinks of alcohol    Types: 14 Standard drinks or equivalent per week    Comment: beer x 2 per day with meals/ may slow down    Drug use: Yes    Types: Marijuana    Comment: once per day   Sexual activity: Not on file  Other Topics Concern   Not on file  Social History Narrative   Lives with spouse   Right Handed   3-4 c caffeine    Social Determinants of Health   Financial Resource Strain: Low Risk  (05/23/2019)   Overall Financial Resource Strain (CARDIA)    Difficulty of Paying Living  Expenses: Not hard at all  Food Insecurity: No Food Insecurity (05/23/2019)   Hunger Vital Sign    Worried About Running Out of Food in the Last Year: Never true    Ran Out of Food in the Last Year: Never true  Transportation Needs: No Transportation Needs (05/23/2019)   PRAPARE - Hydrologist (Medical): No    Lack of Transportation (Non-Medical): No  Physical Activity: Sufficiently Active (05/23/2019)   Exercise Vital Sign    Days of Exercise per Week: 7 days    Minutes of Exercise per Session: 90 min  Stress: No Stress Concern Present (05/23/2019)   [place]    Feeling of Stress : Only a little  Social Connections: Unknown (05/23/2019)   Social Connection and Isolation Panel [NHANES]    Frequency of Communication with Friends and Family: More than  three times a week    Frequency of Social Gatherings with Friends and Family: Once a week    Attends Religious Services: Not on Advertising copywriter or Organizations: Not on file    Attends Archivist Meetings: Not on file    Marital Status: Married   No Known Allergies Family History  Problem Relation Age of Onset   Hypertension Mother    Stroke Mother    Colitis Father    Hypertension Father    Other Father        esophageal stricture   Liver disease Father    Alcohol abuse Maternal Aunt    Rectal cancer Neg Hx    Stomach cancer Neg Hx    Esophageal cancer Neg Hx    Colon polyps Neg Hx    Colon cancer Neg Hx      Current Outpatient Medications (Cardiovascular):    benazepril (LOTENSIN) 40 MG tablet, TAKE 1 TABLET BY MOUTH EVERY DAY   rosuvastatin (CRESTOR) 5 MG tablet, TAKE 1 TABLET BY MOUTH EVERY DAY   Current Outpatient Medications (Analgesics):    Acetaminophen (TYLENOL ARTHRITIS PAIN PO), Take by mouth.   Current Outpatient Medications (Other):    Ascorbic Acid (VITAMIN C PO), Take by mouth.   cholecalciferol (VITAMIN D3) 25 MCG (1000 UNIT) tablet, Take 1,000 Units by mouth daily.   Coenzyme Q10 (COQ10 PO), Take 1,000 mg by mouth.   levETIRAcetam (KEPPRA) 500 MG tablet, Take one tablet in am and two tablets in pm.   Omega-3 Fatty Acids (FISH OIL) 1000 MG CPDR, Take 1 capsule by mouth every morning.   traZODone (DESYREL) 100 MG tablet, Take 1 tablet (100 mg total) by mouth at bedtime as needed for sleep.   Turmeric (QC TUMERIC COMPLEX PO), Take by mouth.   Reviewed prior external information including notes and imaging from  primary care provider As well as notes that were available from care everywhere and other healthcare systems.  Past medical history, social, surgical and family history all reviewed in electronic medical record.  No pertanent information unless stated regarding to the chief complaint.   Review of Systems:  No headache,  visual changes, nausea, vomiting, diarrhea, constipation, dizziness, abdominal pain, skin rash, fevers, chills, night sweats, weight loss, swollen lymph nodes, body aches, joint swelling, chest pain, shortness of breath, mood changes. POSITIVE muscle aches  Objective  Blood pressure (!) 148/72, pulse 68, height '5\' 7"'$  (1.702 m), weight 161 lb (73 kg), SpO2 94 %.   General: No apparent distress alert and oriented  x3 mood and affect normal, dressed appropriately.  HEENT: Pupils equal, extraocular movements intact  Respiratory: Patient's speak in full sentences and does not appear short of breath  Cardiovascular: No lower extremity edema, non tender, no erythema  Right shoulder bearing noted on any type of significant movement.  The patient does have some crepitus noted.  Atrophy of the shoulder girdle noted. Right knee does have some varus deformity of the knee noted.  Instability noted with valgus and varus force.  The patient did bring the brace that does seem to have significant wear.  Procedure: Real-time Ultrasound Guided Injection of right glenohumeral joint Device: GE Logiq Q7  Ultrasound guided injection is preferred based studies that show increased duration, increased effect, greater accuracy, decreased procedural pain, increased response rate with ultrasound guided versus blind injection.  Verbal informed consent obtained.  Time-out conducted.  Noted no overlying erythema, induration, or other signs of local infection.  Skin prepped in a sterile fashion.  Local anesthesia: Topical Ethyl chloride.  With sterile technique and under real time ultrasound guidance:  Joint visualized.  23g 1  inch needle inserted posterior approach. Pictures taken for needle placement. Patient did have injection of 2 cc of 1% lidocaine, 2 cc of 0.5% Marcaine, and 1.0 cc of Kenalog 40 mg/dL. Completed without difficulty  Pain immediately improved suggesting accurate placement of the medication.  Advised to  call if fevers/chills, erythema, induration, drainage, or persistent bleeding.  Impression: Technically successful ultrasound guided injection.   Impression and Recommendations:      The above documentation has been reviewed and is accurate and complete Lyndal Pulley, DO

## 2022-01-27 LAB — CUP PACEART REMOTE DEVICE CHECK
Battery Remaining Longevity: 4 mo
Battery Remaining Percentage: 3 %
Battery Voltage: 2.69 V
Brady Statistic AP VP Percent: 1 %
Brady Statistic AP VS Percent: 8.7 %
Brady Statistic AS VP Percent: 1 %
Brady Statistic AS VS Percent: 91 %
Brady Statistic RA Percent Paced: 8.1 %
Brady Statistic RV Percent Paced: 1 %
Date Time Interrogation Session: 20231010104001
Implantable Lead Implant Date: 20111010
Implantable Lead Implant Date: 20111010
Implantable Lead Location: 753859
Implantable Lead Location: 753860
Implantable Pulse Generator Implant Date: 20111010
Lead Channel Impedance Value: 290 Ohm
Lead Channel Impedance Value: 300 Ohm
Lead Channel Pacing Threshold Amplitude: 0.75 V
Lead Channel Pacing Threshold Amplitude: 1.25 V
Lead Channel Pacing Threshold Pulse Width: 0.4 ms
Lead Channel Pacing Threshold Pulse Width: 0.4 ms
Lead Channel Sensing Intrinsic Amplitude: 2.7 mV
Lead Channel Sensing Intrinsic Amplitude: 5.4 mV
Lead Channel Setting Pacing Amplitude: 1.5 V
Lead Channel Setting Pacing Amplitude: 1.75 V
Lead Channel Setting Pacing Pulse Width: 0.4 ms
Lead Channel Setting Sensing Sensitivity: 2 mV
Pulse Gen Model: 2110
Pulse Gen Serial Number: 7156802

## 2022-01-28 ENCOUNTER — Encounter: Payer: Self-pay | Admitting: Family Medicine

## 2022-01-28 ENCOUNTER — Ambulatory Visit: Payer: Medicare Other | Admitting: Family Medicine

## 2022-01-28 ENCOUNTER — Ambulatory Visit: Payer: Self-pay

## 2022-01-28 VITALS — BP 148/72 | HR 68 | Ht 67.0 in | Wt 161.0 lb

## 2022-01-28 DIAGNOSIS — M12811 Other specific arthropathies, not elsewhere classified, right shoulder: Secondary | ICD-10-CM | POA: Diagnosis not present

## 2022-01-28 DIAGNOSIS — M1711 Unilateral primary osteoarthritis, right knee: Secondary | ICD-10-CM

## 2022-01-28 DIAGNOSIS — G4733 Obstructive sleep apnea (adult) (pediatric): Secondary | ICD-10-CM | POA: Diagnosis not present

## 2022-01-28 DIAGNOSIS — M75101 Unspecified rotator cuff tear or rupture of right shoulder, not specified as traumatic: Secondary | ICD-10-CM

## 2022-01-28 NOTE — Assessment & Plan Note (Signed)
Repeat injection today, tolerated the procedure well, discussed icing regimen and home exercises.  Increase activity slowly otherwise.  Follow-up again in10 weeks.  Chronic problem with worsening symptoms.  Patient still wants to avoid any type of surgical intervention at this point.

## 2022-01-28 NOTE — Patient Instructions (Addendum)
Injected R shoulder today See me in 10 weeks

## 2022-01-28 NOTE — Assessment & Plan Note (Signed)
Significant arthritic changes with abnormal thigh to calf ratio.  Instability with valgus and varus force noted.  Patient is ambulating.  I do feel that a another custom brace with this 47 nearly 81 years old would be beneficial.  We will see if we can get this approved for him and hopefully this will help him avoid any type of surgical intervention and continuing to stay

## 2022-02-06 DIAGNOSIS — M1711 Unilateral primary osteoarthritis, right knee: Secondary | ICD-10-CM | POA: Diagnosis not present

## 2022-02-06 NOTE — Addendum Note (Signed)
Addended by: Douglass Rivers D on: 02/06/2022 10:14 PM   Modules accepted: Level of Service

## 2022-02-06 NOTE — Progress Notes (Signed)
Remote pacemaker transmission.   

## 2022-02-11 ENCOUNTER — Encounter: Payer: Self-pay | Admitting: Internal Medicine

## 2022-02-16 DIAGNOSIS — R300 Dysuria: Secondary | ICD-10-CM | POA: Diagnosis not present

## 2022-02-19 DIAGNOSIS — R310 Gross hematuria: Secondary | ICD-10-CM | POA: Diagnosis not present

## 2022-02-19 DIAGNOSIS — D3001 Benign neoplasm of right kidney: Secondary | ICD-10-CM | POA: Diagnosis not present

## 2022-02-21 DIAGNOSIS — G4733 Obstructive sleep apnea (adult) (pediatric): Secondary | ICD-10-CM | POA: Diagnosis not present

## 2022-02-25 DIAGNOSIS — N281 Cyst of kidney, acquired: Secondary | ICD-10-CM | POA: Diagnosis not present

## 2022-02-25 DIAGNOSIS — K7689 Other specified diseases of liver: Secondary | ICD-10-CM | POA: Diagnosis not present

## 2022-02-25 DIAGNOSIS — R310 Gross hematuria: Secondary | ICD-10-CM | POA: Diagnosis not present

## 2022-02-26 ENCOUNTER — Ambulatory Visit (INDEPENDENT_AMBULATORY_CARE_PROVIDER_SITE_OTHER): Payer: Self-pay

## 2022-02-26 DIAGNOSIS — I495 Sick sinus syndrome: Secondary | ICD-10-CM

## 2022-02-26 LAB — CUP PACEART REMOTE DEVICE CHECK
Battery Remaining Longevity: 4 mo
Battery Remaining Percentage: 3 %
Battery Voltage: 2.69 V
Brady Statistic AP VP Percent: 1 %
Brady Statistic AP VS Percent: 7.1 %
Brady Statistic AS VP Percent: 1 %
Brady Statistic AS VS Percent: 93 %
Brady Statistic RA Percent Paced: 6.6 %
Brady Statistic RV Percent Paced: 1 %
Date Time Interrogation Session: 20231109071622
Implantable Lead Connection Status: 753985
Implantable Lead Connection Status: 753985
Implantable Lead Implant Date: 20111010
Implantable Lead Implant Date: 20111010
Implantable Lead Location: 753859
Implantable Lead Location: 753860
Implantable Pulse Generator Implant Date: 20111010
Lead Channel Impedance Value: 280 Ohm
Lead Channel Impedance Value: 310 Ohm
Lead Channel Pacing Threshold Amplitude: 0.75 V
Lead Channel Pacing Threshold Amplitude: 1.25 V
Lead Channel Pacing Threshold Pulse Width: 0.4 ms
Lead Channel Pacing Threshold Pulse Width: 0.4 ms
Lead Channel Sensing Intrinsic Amplitude: 2.3 mV
Lead Channel Sensing Intrinsic Amplitude: 5.4 mV
Lead Channel Setting Pacing Amplitude: 1.5 V
Lead Channel Setting Pacing Amplitude: 1.75 V
Lead Channel Setting Pacing Pulse Width: 0.4 ms
Lead Channel Setting Sensing Sensitivity: 2 mV
Pulse Gen Model: 2110
Pulse Gen Serial Number: 7156802

## 2022-03-02 DIAGNOSIS — G4733 Obstructive sleep apnea (adult) (pediatric): Secondary | ICD-10-CM | POA: Diagnosis not present

## 2022-03-05 NOTE — Progress Notes (Signed)
Remote pacemaker transmission.   

## 2022-03-06 DIAGNOSIS — N35013 Post-traumatic anterior urethral stricture: Secondary | ICD-10-CM | POA: Diagnosis not present

## 2022-03-10 ENCOUNTER — Other Ambulatory Visit: Payer: Self-pay | Admitting: Urology

## 2022-03-18 ENCOUNTER — Encounter: Payer: Self-pay | Admitting: Internal Medicine

## 2022-03-18 NOTE — Progress Notes (Signed)
PERIOPERATIVE PRESCRIPTION FOR IMPLANTED CARDIAC DEVICE PROGRAMMING  Patient Information: Name:  OSBALDO MARK  DOB:  [birth year]  MRN:  [id number]    Planned Procedure:  Transurethral Resection Bladder Tumor  Surgeon:  Dr Franchot Gallo  Date of Procedure:  03-23-2022  Cautery will be used.  Position during surgery:  lithotomy   Please send documentation back to:  [facility] (Fax # [phone]  Device Information:  Clinic EP Physician:  Virl Axe, MD   Device Type:  Pacemaker Manufacturer and Phone #:  St. Jude/Abbott: [phone] Pacemaker Dependent?:  No. Date of Last Device Check:  02/26/2022 Normal Device Function?:  Yes.    Electrophysiologist's Recommendations:  Have magnet available. Provide continuous ECG monitoring when magnet is used or reprogramming is to be performed.  Procedure should not interfere with device function.  No device programming or magnet placement needed.  Per Device Clinic Standing Orders, Simone Curia, RN  12:33 PM 03/18/2022

## 2022-03-19 ENCOUNTER — Encounter (HOSPITAL_BASED_OUTPATIENT_CLINIC_OR_DEPARTMENT_OTHER): Payer: Self-pay | Admitting: Urology

## 2022-03-20 ENCOUNTER — Encounter (HOSPITAL_BASED_OUTPATIENT_CLINIC_OR_DEPARTMENT_OTHER): Payer: Self-pay | Admitting: Urology

## 2022-03-20 NOTE — Progress Notes (Signed)
Spoke w/ via phone for pre-op interview--- pt Lab needs dos---- [employer] results------ current EKG in epic/ chart COVID test -----patient states asymptomatic no test needed Arrive at ------- 0800 on 03-23-2022 NPO after MN NO Solid Food.  Clear liquids from MN until--- 0700 Med rec completed Medications to take morning of surgery ----- keppra Diabetic medication ----- n/a Patient instructed no nail polish to be worn day of surgery Patient instructed to bring photo id and insurance card day of surgery Patient aware to have Driver (ride ) / caregiver  for 24 hours after surgery -- wife, peggy Patient Special Instructions ----- n/a Pre-Op special Istructions ----- requested device orders via inbox message in epic and completed 03-18-2022 in epic/ chart Patient verbalized understanding of instructions that were given at this phone interview. Patient denies shortness of breath, chest pain, fever, cough at this phone interview.   Anesthesia Review:  HTN;   hx recurrent syncope (started 2005, pt stated it has been many yrs ago last one)  found documented sinus node dysfuction s/p PPM 01-27-2010 ([address] [place]);  Complex sleep apnea syndrome, uses cpap nightly;  seizure disorder (nocturnal) dx 2021,  pt stated last one 01/ 2023 none since.  PCP:  Dr Elease Hashimoto ([place] 02-18-2021 epic) Cardiologist :  Dr Caryl Comes ([place] 12-02-2021 epic) Neurologist:  Dr Leonie Man Cassell Clement 09-18-2021) Sleep apnea neurologist:  Dr Dohmeier Cassell Clement 10-13-2021) EKG :  12-02-2021 Last pacer check:  02-26-2022  Echo : no Stress test: no Cardiac Cath : no Activity level: denies sob w/ any activity

## 2022-03-23 ENCOUNTER — Encounter (HOSPITAL_BASED_OUTPATIENT_CLINIC_OR_DEPARTMENT_OTHER)
Admission: RE | Disposition: A | Discharge status: Home / Self Care | Payer: Self-pay | Source: Home / Self Care | Attending: Urology

## 2022-03-23 ENCOUNTER — Encounter (HOSPITAL_BASED_OUTPATIENT_CLINIC_OR_DEPARTMENT_OTHER): Payer: Self-pay | Admitting: Urology

## 2022-03-23 ENCOUNTER — Ambulatory Visit (HOSPITAL_BASED_OUTPATIENT_CLINIC_OR_DEPARTMENT_OTHER): Payer: Medicare Other | Admitting: Anesthesiology

## 2022-03-23 ENCOUNTER — Other Ambulatory Visit: Payer: Self-pay

## 2022-03-23 ENCOUNTER — Ambulatory Visit (HOSPITAL_BASED_OUTPATIENT_CLINIC_OR_DEPARTMENT_OTHER)
Admission: RE | Admit: 2022-03-23 | Discharge: 2022-03-23 | Disposition: A | Discharge status: Home / Self Care | Payer: Medicare Other | Attending: Urology | Admitting: Urology

## 2022-03-23 DIAGNOSIS — K219 Gastro-esophageal reflux disease without esophagitis: Secondary | ICD-10-CM | POA: Diagnosis not present

## 2022-03-23 DIAGNOSIS — M1990 Unspecified osteoarthritis, unspecified site: Secondary | ICD-10-CM | POA: Insufficient documentation

## 2022-03-23 DIAGNOSIS — Z87891 Personal history of nicotine dependence: Secondary | ICD-10-CM | POA: Diagnosis not present

## 2022-03-23 DIAGNOSIS — G4733 Obstructive sleep apnea (adult) (pediatric): Secondary | ICD-10-CM | POA: Diagnosis not present

## 2022-03-23 DIAGNOSIS — R569 Unspecified convulsions: Secondary | ICD-10-CM | POA: Diagnosis not present

## 2022-03-23 DIAGNOSIS — I10 Essential (primary) hypertension: Secondary | ICD-10-CM | POA: Diagnosis not present

## 2022-03-23 DIAGNOSIS — C672 Malignant neoplasm of lateral wall of bladder: Secondary | ICD-10-CM | POA: Insufficient documentation

## 2022-03-23 DIAGNOSIS — Z950 Presence of cardiac pacemaker: Secondary | ICD-10-CM | POA: Insufficient documentation

## 2022-03-23 DIAGNOSIS — Z01818 Encounter for other preprocedural examination: Secondary | ICD-10-CM

## 2022-03-23 DIAGNOSIS — D494 Neoplasm of unspecified behavior of bladder: Secondary | ICD-10-CM

## 2022-03-23 DIAGNOSIS — C678 Malignant neoplasm of overlapping sites of bladder: Secondary | ICD-10-CM

## 2022-03-23 DIAGNOSIS — C679 Malignant neoplasm of bladder, unspecified: Secondary | ICD-10-CM | POA: Diagnosis not present

## 2022-03-23 DIAGNOSIS — G4730 Sleep apnea, unspecified: Secondary | ICD-10-CM | POA: Insufficient documentation

## 2022-03-23 DIAGNOSIS — R310 Gross hematuria: Secondary | ICD-10-CM | POA: Diagnosis not present

## 2022-03-23 HISTORY — DX: Other specified postprocedural states: Z98.890

## 2022-03-23 HISTORY — DX: Restless legs syndrome: G25.81

## 2022-03-23 HISTORY — DX: Nocturia: R35.1

## 2022-03-23 HISTORY — DX: Mild cognitive impairment of uncertain or unknown etiology: G31.84

## 2022-03-23 HISTORY — DX: Presence of external hearing-aid: Z97.4

## 2022-03-23 HISTORY — DX: Personal history of adenomatous and serrated colon polyps: Z86.0101

## 2022-03-23 HISTORY — DX: Unspecified rotator cuff tear or rupture of right shoulder, not specified as traumatic: M75.101

## 2022-03-23 HISTORY — DX: Personal history of other malignant neoplasm of skin: Z85.828

## 2022-03-23 HISTORY — DX: Other specific arthropathies, not elsewhere classified, right shoulder: M12.811

## 2022-03-23 HISTORY — DX: Neoplasm of unspecified behavior of bladder: D49.4

## 2022-03-23 HISTORY — DX: Personal history of other specified conditions: Z87.898

## 2022-03-23 HISTORY — DX: Essential (primary) hypertension: I10

## 2022-03-23 HISTORY — DX: Unspecified osteoarthritis, unspecified site: M19.90

## 2022-03-23 HISTORY — DX: Other specified postprocedural states: Z85.9

## 2022-03-23 HISTORY — DX: Personal history of transient ischemic attack (TIA), and cerebral infarction without residual deficits: Z86.73

## 2022-03-23 HISTORY — DX: Obstructive sleep apnea (adult) (pediatric): G47.33

## 2022-03-23 HISTORY — DX: Personal history of colonic polyps: Z86.010

## 2022-03-23 HISTORY — DX: Hyperlipidemia, unspecified: E78.5

## 2022-03-23 HISTORY — PX: TRANSURETHRAL RESECTION OF BLADDER TUMOR WITH MITOMYCIN-C: SHX6459

## 2022-03-23 HISTORY — DX: Presence of spectacles and contact lenses: Z97.3

## 2022-03-23 LAB — POCT I-STAT, CHEM 8
BUN: 13 mg/dL (ref 8–23)
Calcium, Ion: 1.23 mmol/L (ref 1.15–1.40)
Chloride: 100 mmol/L (ref 98–111)
Creatinine, Ser: 0.8 mg/dL (ref 0.61–1.24)
Glucose, Bld: 91 mg/dL (ref 70–99)
HCT: 41 % (ref 39.0–52.0)
Hemoglobin: 13.9 g/dL (ref 13.0–17.0)
Potassium: 4.3 mmol/L (ref 3.5–5.1)
Sodium: 136 mmol/L (ref 135–145)
TCO2: 25 mmol/L (ref 22–32)

## 2022-03-23 SURGERY — TRANSURETHRAL RESECTION OF BLADDER TUMOR WITH MITOMYCIN-C
Anesthesia: General | Site: Bladder

## 2022-03-23 MED ORDER — FENTANYL CITRATE (PF) 100 MCG/2ML IJ SOLN
INTRAMUSCULAR | Status: AC
  Filled 2022-03-23: qty 2

## 2022-03-23 MED ORDER — DEXAMETHASONE SODIUM PHOSPHATE 10 MG/ML IJ SOLN
INTRAMUSCULAR | Status: DC | PRN
  Administered 2022-03-23: 5 mg via INTRAVENOUS

## 2022-03-23 MED ORDER — EPHEDRINE SULFATE (PRESSORS) 50 MG/ML IJ SOLN
INTRAMUSCULAR | Status: DC | PRN
  Administered 2022-03-23: 10 mg via INTRAVENOUS
  Administered 2022-03-23: 15 mg via INTRAVENOUS

## 2022-03-23 MED ORDER — FENTANYL CITRATE (PF) 100 MCG/2ML IJ SOLN
INTRAMUSCULAR | Status: DC | PRN
  Administered 2022-03-23: 25 ug via INTRAVENOUS
  Administered 2022-03-23: 50 ug via INTRAVENOUS

## 2022-03-23 MED ORDER — AMISULPRIDE (ANTIEMETIC) 5 MG/2ML IV SOLN: 10.0000 mg | Freq: Once | INTRAVENOUS | Status: DC | PRN

## 2022-03-23 MED ORDER — SODIUM CHLORIDE 0.9 % IR SOLN
Status: DC | PRN
  Administered 2022-03-23 (×2): 3000 mL via INTRAVESICAL

## 2022-03-23 MED ORDER — KETOROLAC TROMETHAMINE 15 MG/ML IJ SOLN
INTRAMUSCULAR | Status: AC
  Filled 2022-03-23: qty 1

## 2022-03-23 MED ORDER — KETOROLAC TROMETHAMINE 15 MG/ML IJ SOLN
15.0000 mg | Freq: Once | INTRAMUSCULAR | Status: AC
  Administered 2022-03-23: 15 mg via INTRAVENOUS

## 2022-03-23 MED ORDER — SUGAMMADEX SODIUM 200 MG/2ML IV SOLN
INTRAVENOUS | Status: DC | PRN
  Administered 2022-03-23: 200 mg via INTRAVENOUS

## 2022-03-23 MED ORDER — GEMCITABINE CHEMO FOR BLADDER INSTILLATION 2000 MG
2000.0000 mg | Freq: Once | INTRAVENOUS | Status: AC
  Administered 2022-03-23: 2000 mg via INTRAVESICAL
  Filled 2022-03-23: qty 2000

## 2022-03-23 MED ORDER — GLYCOPYRROLATE PF 0.2 MG/ML IJ SOSY
PREFILLED_SYRINGE | INTRAMUSCULAR | Status: AC
  Filled 2022-03-23: qty 1

## 2022-03-23 MED ORDER — ROCURONIUM BROMIDE 10 MG/ML (PF) SYRINGE
PREFILLED_SYRINGE | INTRAVENOUS | Status: DC | PRN
  Administered 2022-03-23: 40 mg via INTRAVENOUS

## 2022-03-23 MED ORDER — STERILE WATER FOR IRRIGATION IR SOLN
Status: DC | PRN
  Administered 2022-03-23: 500 mL

## 2022-03-23 MED ORDER — DEXAMETHASONE SODIUM PHOSPHATE 10 MG/ML IJ SOLN
INTRAMUSCULAR | Status: AC
  Filled 2022-03-23: qty 1

## 2022-03-23 MED ORDER — LIDOCAINE 2% (20 MG/ML) 5 ML SYRINGE
INTRAMUSCULAR | Status: DC | PRN
  Administered 2022-03-23: 60 mg via INTRAVENOUS

## 2022-03-23 MED ORDER — ACETAMINOPHEN 500 MG PO TABS
ORAL_TABLET | ORAL | Status: AC
  Filled 2022-03-23: qty 2

## 2022-03-23 MED ORDER — ONDANSETRON HCL 4 MG/2ML IJ SOLN
INTRAMUSCULAR | Status: DC | PRN
  Administered 2022-03-23: 4 mg via INTRAVENOUS

## 2022-03-23 MED ORDER — PROPOFOL 10 MG/ML IV BOLUS
INTRAVENOUS | Status: DC | PRN
  Administered 2022-03-23: 150 mg via INTRAVENOUS
  Administered 2022-03-23: 20 mg via INTRAVENOUS

## 2022-03-23 MED ORDER — CEFAZOLIN SODIUM-DEXTROSE 2-4 GM/100ML-% IV SOLN
2.0000 g | INTRAVENOUS | Status: AC
  Administered 2022-03-23: 2 g via INTRAVENOUS

## 2022-03-23 MED ORDER — CEFAZOLIN SODIUM-DEXTROSE 2-4 GM/100ML-% IV SOLN
INTRAVENOUS | Status: AC
  Filled 2022-03-23: qty 100

## 2022-03-23 MED ORDER — LIDOCAINE HCL (PF) 2 % IJ SOLN
INTRAMUSCULAR | Status: AC
  Filled 2022-03-23: qty 10

## 2022-03-23 MED ORDER — FENTANYL CITRATE (PF) 100 MCG/2ML IJ SOLN
25.0000 ug | INTRAMUSCULAR | Status: DC | PRN
  Administered 2022-03-23 (×2): 25 ug via INTRAVENOUS

## 2022-03-23 MED ORDER — PROPOFOL 10 MG/ML IV BOLUS
INTRAVENOUS | Status: AC
  Filled 2022-03-23: qty 20

## 2022-03-23 MED ORDER — ACETAMINOPHEN 500 MG PO TABS
1000.0000 mg | ORAL_TABLET | Freq: Once | ORAL | Status: AC
  Administered 2022-03-23: 1000 mg via ORAL

## 2022-03-23 MED ORDER — ONDANSETRON HCL 4 MG/2ML IJ SOLN
INTRAMUSCULAR | Status: AC
  Filled 2022-03-23: qty 2

## 2022-03-23 MED ORDER — ROCURONIUM BROMIDE 10 MG/ML (PF) SYRINGE
PREFILLED_SYRINGE | INTRAVENOUS | Status: AC
  Filled 2022-03-23: qty 10

## 2022-03-23 MED ORDER — LACTATED RINGERS IV SOLN: INTRAVENOUS | Status: DC

## 2022-03-23 MED ORDER — EPHEDRINE 5 MG/ML INJ
INTRAVENOUS | Status: AC
  Filled 2022-03-23: qty 5

## 2022-03-23 SURGICAL SUPPLY — 31 items
BAG DRAIN URO-CYSTO SKYTR STRL (DRAIN) ×2 IMPLANT
BAG DRN RND TRDRP ANRFLXCHMBR (UROLOGICAL SUPPLIES)
BAG DRN UROCATH (DRAIN) ×1
BAG URINE DRAIN 2000ML AR STRL (UROLOGICAL SUPPLIES) IMPLANT
BAG URINE LEG 500ML (DRAIN) IMPLANT
CATH FOLEY 2WAY SLVR  5CC 18FR (CATHETERS) ×1
CATH FOLEY 2WAY SLVR  5CC 20FR (CATHETERS)
CATH FOLEY 2WAY SLVR  5CC 22FR (CATHETERS)
CATH FOLEY 2WAY SLVR 5CC 18FR (CATHETERS) IMPLANT
CATH FOLEY 2WAY SLVR 5CC 20FR (CATHETERS) IMPLANT
CATH FOLEY 2WAY SLVR 5CC 22FR (CATHETERS) IMPLANT
CATH FOLEY 3WAY 30CC 22FR (CATHETERS) IMPLANT
CLOTH BEACON ORANGE TIMEOUT ST (SAFETY) ×2 IMPLANT
ELECT REM PT RETURN 9FT ADLT (ELECTROSURGICAL) ×1
ELECTRODE REM PT RTRN 9FT ADLT (ELECTROSURGICAL) ×2 IMPLANT
EVACUATOR MICROVAS BLADDER (UROLOGICAL SUPPLIES) IMPLANT
GLOVE BIO SURGEON STRL SZ8 (GLOVE) ×2 IMPLANT
GOWN STRL REUS W/TWL XL LVL3 (GOWN DISPOSABLE) ×2 IMPLANT
HOLDER FOLEY CATH W/STRAP (MISCELLANEOUS) IMPLANT
IV NS IRRIG 3000ML ARTHROMATIC (IV SOLUTION) ×2 IMPLANT
KIT TURNOVER CYSTO (KITS) ×2 IMPLANT
LOOP CUT BIPOLAR 24F LRG (ELECTROSURGICAL) ×2 IMPLANT
MANIFOLD NEPTUNE II (INSTRUMENTS) ×2 IMPLANT
NS IRRIG 500ML POUR BTL (IV SOLUTION) ×2 IMPLANT
PACK CYSTO (CUSTOM PROCEDURE TRAY) ×2 IMPLANT
PLUG CATH AND CAP STER (CATHETERS) IMPLANT
SYR TOOMEY IRRIG 70ML (MISCELLANEOUS) ×1
SYRINGE TOOMEY IRRIG 70ML (MISCELLANEOUS) ×2 IMPLANT
TUBE CONNECTING 12X1/4 (SUCTIONS) ×2 IMPLANT
TUBING UROLOGY SET (TUBING) IMPLANT
WATER STERILE IRR 500ML POUR (IV SOLUTION) IMPLANT

## 2022-03-23 NOTE — H&P (Signed)
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  He is followed with active surveillance for small renal lesions which may well be renal cell carcinomas.  He presented recently with gross hematuria.  CT hematuria protocol revealed stable renal masses, also filling defects within the bladder.  Cystoscopic exam revealed at least a 1 cm right bladder wall lesion most likely TCCA as well as several small papillary lesions elsewhere.  He presents at this time for TURBT and gemcitabine placement.  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   by dr Caryl Comes for documented SND,  St Jude dual chamber   GERD (gastroesophageal reflux disease)    History of adenomatous polyp of colon    History of basal cell carcinoma (BCC) excision    forehead and nose area 2015, 2016   History of squamous cell carcinoma excision    excision bilateral temple area  2015, 2016, 2019   History of syncope 2005   2005  w/ recurrent ;  documented sinus node dysfunction,   s/p PPM 2011  (03-20-2022  pt stated many yrs since last syncope)   History of transient ischemic attack (TIA)    Hyperlipidemia    Hypertension    Mild cognitive impairment    Nocturia    OA (osteoarthritis)    OSA on CPAP    followed by dr Brett Fairy;   study in epic 06-23-2021 severe complex apnea syndrome,   pt stated usese cpap nightly   RLS (restless legs syndrome)    Rotator cuff tear arthropathy, right    Seizure disorder [facility]) 2021   neurologist--- dr Leonie Man;   started 2021 nocturnal seizures;   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, not intractable , without status epilepticus  (03-20-2022  pt stated last seizure 01/ 2023)   Sinus node dysfunction [facility]) 2010   cardiologist--- dr Caryl Comes;  hx recurrent syncope;    04/ 2010  loop recorder implant w/ documented SND;   01-27-2010  s/p PPM dual chamber , St Jude   Wears glasses    Wears hearing aid in both ears     Past Surgical History:  Procedure Laterality Date   CARDIAC PACEMAKER PLACEMENT  01/27/2010   '@[facility]'$  by dr Caryl Comes;   explant loop recorder /  PPM  St Jude dual chamber   COLONOSCOPY WITH PROPOFOL  08/07/2020   dr pyrle   ESOPHAGOGASTRODUODENOSCOPY (EGD) WITH PROPOFOL  02/05/2014   dr pyrtle   LOOP RECORDER IMPLANT  08/17/2008   '@[facility]'$   by dr Caryl Comes;   after tilt table study normal;  for recurrent syncope    Home Medications:    Allergies: No Known Allergies  Family History  Problem Relation Age of Onset   Hypertension Mother    Stroke Mother    Colitis Father    Hypertension Father    Other Father        esophageal stricture   Liver disease Father    Alcohol abuse Maternal Aunt    Rectal cancer Neg Hx    Stomach cancer Neg Hx    Esophageal cancer Neg Hx    Colon polyps Neg Hx    Colon cancer Neg Hx     Social History:  reports that he quit smoking about 41 years ago. His smoking use included cigarettes. He  has never used smokeless tobacco. He reports current alcohol use of about 14.0 standard drinks of alcohol per week. He reports current drug use. Frequency: 7.00 times per week. Drug: Marijuana.  ROS: A complete review of systems was performed.  All systems are negative except for pertinent findings as noted.  Physical Exam:  Vital signs in last 24 hours: BP (!) 152/68   Pulse 62   Temp 97.6 F (36.4 C) (Oral)   Resp 16   Ht '5\' 7"'$  (1.702 m)   Wt 69.9 kg   SpO2 99%   BMI 24.14 kg/m  Constitutional:  Alert and oriented, No acute distress Cardiovascular: Regular rate  Respiratory: Normal respiratory effort Neurologic: Grossly intact, no focal deficits Psychiatric: Normal mood and affect  I have reviewed prior pt notes  I have reviewed notes from referring/previous physicians  I have reviewed urinalysis  results  I have independently reviewed prior imaging    Impression/Assessment:  Bladder lesions, most likely urothelial carcinomas  Plan:  TURBT/placement of gemcitabine

## 2022-03-23 NOTE — Discharge Instructions (Addendum)
You may see some blood in the urine and may have some burning with urination for 48-72 hours. You also may notice that you have to urinate more frequently or urgently after your procedure which is normal.  You should call should you develop an inability urinate, fever > 101, persistent nausea and vomiting that prevents you from eating or drinking to stay hydrated.  If you have a catheter, you will be taught how to take care of the catheter by the nursing staff prior to discharge from the hospital.  You may periodically feel a strong urge to void with the catheter in place.  This is a bladder spasm and most often can occur when having a bowel movement or moving around. It is typically self-limited and usually will stop after a few minutes.  You may use some Vaseline or Neosporin around the tip of the catheter to reduce friction at the tip of the penis. You may also see some blood in the urine.  A very small amount of blood can make the urine look quite red.  As long as the catheter is draining well, there usually is not a problem.  However, if the catheter is not draining well and is bloody, you should call the office [phone]) to notify us.  It is okay to remove the catheter on Tuesday morning as instructed by the nursing staff.    Post Anesthesia Home Care Instructions  Activity: Get plenty of rest for the remainder of the day. A responsible individual must stay with you for 24 hours following the procedure.  For the next 24 hours, DO NOT: -Drive a car -Paediatric nurse -Drink alcoholic beverages -Take any medication unless instructed by your physician -Make any legal decisions or sign important papers.  Meals: Start with liquid foods such as gelatin or soup. Progress to regular foods as tolerated. Avoid greasy, spicy, heavy foods. If nausea and/or vomiting occur, drink only clear liquids until the nausea and/or vomiting subsides. Call your physician if vomiting continues.  Special  Instructions/Symptoms: Your throat may feel dry or sore from the anesthesia or the breathing tube placed in your throat during surgery. If this causes discomfort, gargle with warm salt water. The discomfort should disappear within 24 hours.

## 2022-03-23 NOTE — Transfer of Care (Signed)
Immediate Anesthesia Transfer of Care Note  Patient: [name] [name]  Procedure(s) Performed: TRANSURETHRAL RESECTION OF BLADDER TUMOR WITH POST OPERATIVE GEMCITABINE (Bladder)  Patient Location: PACU  Anesthesia Type:General  Level of Consciousness: awake, alert , oriented, and patient cooperative  Airway & Oxygen Therapy: Patient Spontanous Breathing  Post-op Assessment: Report given to RN and Post -op Vital signs reviewed and stable  Post vital signs: Reviewed and stable  Last Vitals:  Vitals Value Taken Time  BP 149/55 03/23/22 1041  Temp    Pulse 57 03/23/22 1043  Resp 13 03/23/22 1043  SpO2 98 % 03/23/22 1043  Vitals shown include unvalidated device data.  Last Pain:  Vitals:   03/23/22 0810  TempSrc: Oral         Complications: No notable events documented.

## 2022-03-23 NOTE — Op Note (Signed)
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  Bladder tumor 2.  Bladder tumor base  Drains: None  Indications: 81 year old male patient presenting with gross hematuria recently.  He is followed for small incidental lesion found renal masses.  CT hematuria protocol revealed small filling defects of the bladder, cystoscopy revealed a 1.5 cm right lateral wall papillary lesion plus a small nearby lesion in the posterior/dome area.  He presents at this time for resection and gemcitabine administration.  I discussed the procedure with the patient, risk, complications and expected outcomes.  He understands and desires to proceed.  Findings: Urethra was normal except for meatal stenosis.  This was dilated to 52 [place] with [employer].  He had mild BPH with a high riding bladder neck.  Inspection of the bladder revealed normal ureteral orifices bilaterally.  There was a 1.5 cm papillary lesion of the right bladder wall located fairly anteriorly.  Additionally, there was a smaller midline lesion at the dome/posterior wall.  No other urothelial lesions were noted.  Description of procedure: The patient was properly identified in the holding area, taken to the operating room where general anesthetic was administered with the endotracheal apparatus.  He was placed in the dorsolithotomy position.  Genitalia and perineum were prepped, draped, proper timeout performed.  Urethral dilation carried out to 4 [place] with [employer].  Small amount of bleeding from this.  I then negotiated the 26 French resectoscope sheath using the visual obturator.   I then placed the cutting loop, bipolar.  Inspection of the bladder was then performed.  I then resected the right sided bladder wall tumor down to muscular layer.  The smaller nearby papillary lesion was also resected.  These bladder tumor fragments were sent labeled "bladder tumor".  I then resected the base of the right lateral wall tumor.  This was then sent labeled "bladder tumor base".  I then cauterized the bases of both resected areas.  There was adequate hemostasis at this point.  Following this, the scope was removed and the procedure terminated.  The patient was then awakened, extubated, taken to the PACU in stable condition, having tolerated the procedure well.  In the recovery room I placed 2 g of gemcitabine which was left instilled in the bladder for 1 hour then drained.

## 2022-03-23 NOTE — Progress Notes (Signed)
Pts wife called [facility] and stated pts foley had come out when pt got home and was changing clothes.  MD was called and he instructed pt to come to his office now.  Pt aware.

## 2022-03-23 NOTE — Anesthesia Procedure Notes (Signed)
Procedure Name: Intubation Date/Time: 03/23/2022 9:59 AM  Performed by: Rogers Blocker, CRNAPre-anesthesia Checklist: Patient identified, Emergency Drugs available, Suction available and Patient being monitored Patient Re-evaluated:Patient Re-evaluated prior to induction Oxygen Delivery Method: Circle System Utilized Preoxygenation: Pre-oxygenation with 100% oxygen Induction Type: IV induction Ventilation: Mask ventilation without difficulty Laryngoscope Size: Mac and 4 Grade View: Grade I Tube type: Oral Tube size: 7.0 mm Number of attempts: 1 Airway Equipment and Method: Stylet and Bite block Placement Confirmation: ETT inserted through vocal cords under direct vision, positive ETCO2 and breath sounds checked- equal and bilateral Secured at: 22 cm Tube secured with: Tape Dental Injury: Teeth and Oropharynx as per pre-operative assessment

## 2022-03-23 NOTE — Anesthesia Preprocedure Evaluation (Signed)
Anesthesia Evaluation  Patient identified by MRN, date of birth, ID band Patient awake    Reviewed: Allergy & Precautions, NPO status , Patient's Chart, lab work & pertinent test results  Airway Mallampati: II  TM Distance: >3 FB Neck ROM: Full    Dental  (+) Dental Advisory Given   Pulmonary sleep apnea , Patient abstained from smoking., former smoker   breath sounds clear to auscultation       Cardiovascular hypertension, Pt. on medications + pacemaker  Rhythm:Regular Rate:Normal     Neuro/Psych Seizures -,     GI/Hepatic Neg liver ROS,GERD  ,,  Endo/Other  negative endocrine ROS    Renal/GU negative Renal ROS     Musculoskeletal  (+) Arthritis ,    Abdominal   Peds  Hematology negative hematology ROS (+)   Anesthesia Other Findings   Reproductive/Obstetrics                             Anesthesia Physical Anesthesia Plan  ASA: 2  Anesthesia Plan: General   Post-op Pain Management: Tylenol PO (pre-op)*   Induction: Intravenous  PONV Risk Score and Plan: 2 and Dexamethasone, Ondansetron and Treatment may vary due to age or medical condition  Airway Management Planned: LMA and Oral ETT  Additional Equipment: None  Intra-op Plan:   Post-operative Plan: Extubation in OR  Informed Consent: I have reviewed the patients History and Physical, chart, labs and discussed the procedure including the risks, benefits and alternatives for the proposed anesthesia with the patient or authorized representative who has indicated his/her understanding and acceptance.     Dental advisory given  Plan Discussed with: CRNA  Anesthesia Plan Comments:        Anesthesia Quick Evaluation

## 2022-03-24 ENCOUNTER — Encounter (HOSPITAL_BASED_OUTPATIENT_CLINIC_OR_DEPARTMENT_OTHER): Payer: Self-pay | Admitting: Urology

## 2022-03-24 LAB — SURGICAL PATHOLOGY

## 2022-03-24 NOTE — Anesthesia Postprocedure Evaluation (Signed)
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  Last Vitals:  Vitals:   03/23/22 1330 03/23/22 1345  BP: (!) 154/83 (!) 159/72  Pulse: 64 63  Resp:    Temp:    SpO2: 100% 100%    Last Pain:  Vitals:   03/24/22 1042  TempSrc:   PainSc: 2    Pain Goal:                   Tiajuana Amass

## 2022-03-30 ENCOUNTER — Encounter: Payer: Self-pay | Admitting: Neurology

## 2022-03-30 ENCOUNTER — Ambulatory Visit (INDEPENDENT_AMBULATORY_CARE_PROVIDER_SITE_OTHER): Payer: Medicare Other

## 2022-03-30 ENCOUNTER — Ambulatory Visit: Payer: Medicare Other | Admitting: Neurology

## 2022-03-30 VITALS — BP 163/63 | HR 61 | Ht 67.0 in | Wt 154.0 lb

## 2022-03-30 DIAGNOSIS — G3184 Mild cognitive impairment, so stated: Secondary | ICD-10-CM

## 2022-03-30 DIAGNOSIS — I495 Sick sinus syndrome: Secondary | ICD-10-CM | POA: Diagnosis not present

## 2022-03-30 DIAGNOSIS — Z8669 Personal history of other diseases of the nervous system and sense organs: Secondary | ICD-10-CM | POA: Diagnosis not present

## 2022-03-30 NOTE — Patient Instructions (Signed)
I had a long discussion with the patient and his wife regarding his seizures which presently appear well controlled on the current medication regimen of Keppra 500 mg in the morning and 1000 mg at night.    I again asked him to avoid seizure provoking stimuli like sleep deprivation, medication noncompliance, extremes of activity and dietary changes.  I also advised him to increase participation in cognitively challenging activities like solving crossword puzzles, playing bridge, sudoku and word searches for his mild cognitive impairment which also appears stable.  He also discussed memory compensation strategies.  He will return for follow-up in the future in 6 months or call earlier if necessary.  Memory Compensation Strategies  Use "WARM" strategy.  W= write it down  A= associate it  R= repeat it  M= make a mental note  2.   You can keep a Social worker.  Use a 3-ring notebook with sections for the following: calendar, important names and phone numbers,  medications, doctors' names/phone numbers, lists/reminders, and a section to journal what you did  each day.   3.    Use a calendar to write appointments down.  4.    Write yourself a schedule for the day.  This can be placed on the calendar or in a separate section of the Memory Notebook.  Keeping a  regular schedule can help memory.  5.    Use medication organizer with sections for each day or morning/evening pills.  You may need help loading it  6.    Keep a basket, or pegboard by the door.  Place items that you need to take out with you in the basket or on the pegboard.  You may also want to  include a message board for reminders.  7.    Use sticky notes.  Place sticky notes with reminders in a place where the task is performed.  For example: " turn off the  stove" placed by the stove, "lock the door" placed on the door at eye level, " take your medications" on  the bathroom mirror or by the place where you normally take your  medications.  8.    Use alarms/timers.  Use while cooking to remind yourself to check on food or as a reminder to take your medicine, or as a  reminder to make a call, or as a reminder to perform another task, etc.

## 2022-03-30 NOTE — Progress Notes (Signed)
[facility] [address] [place]. [place] [address] [phone]       OFFICE FOLLOW UP VISIT NOTE  Mr. DAMANY EASTMAN Date of Birth:  11-04-1940 Medical Record Number:  [id number]   Referring MD: Carolann Littler  Reason for Referral: Loss of consciousness episode  JIR:[id number] visit 10/03/2019 ; [name] is a pleasant 81 year old Caucasian male seen today for initial office consultation visit.  Is accompanied by his wife.  History is obtained from him and review of referral notes and imaging results though actual films were not available at the time of this visit.  His past medical history of hypertension, hyperlipidemia, sinus node dysfunction status post pacemaker insertion who was visiting [place] when on 09/12/2019 he had a transient episode of confusion with brief loss of consciousness.  He states that he woke up on that day with an episode of diarrhea and went to the bathroom and then subsequently made himself a cup of coffee.  Last thing he remembers is sitting in a chair and drinking coffee.  Reportedly his sister got up and found him asleep on the chair and in awkward position.  She tried to arouse him but he did not wake up and sounded confused and his speech was garbled and did not make sense.  EMS were called who found him to be arousable but he did not have any focal deficits to raise concern for stroke.  He had one episode of vomiting.  By the time he reached the hospital he was slightly disoriented and confused but had no focal deficits.  His lab work was pretty unremarkable urine drug screen was negative.  Alcohol test was negative.  CT scan of the head showed generalized cortical volume loss with changes of small vessel disease and slight enlargement of the ventricles disproportionate to the degree of atrophy as per radiology report.  CT angiogram of brain and neck was recommended and the neurologist consultation but I do not have   copy of the report she is CT scan  of the abdomen pelvis showed right lower kidney pole mass suspicious for renal cell carcinoma.  Carotid ultrasound showed no significant extracranial stenosis.  EEG was not done.  Patient states he still does not remember all the details of the episode.  He has a pacemaker since 2010 which is MRI incompatible hence it was not done.  Patient states is felt fine since his come here has had no further episodes of loss of consciousness.  He denies any prior history of seizures, significant head injury with loss of consciousness strokes or TIAs.  He does have a long-term mild memory difficulties which are mostly short-term.  These are not progressive as per him and his wife.  He does have a family history of dementia however.  He denies symptoms suggestive of normal pressure hydrocephalus in the form of progressive gait and balance difficulties, bladder incontinence or progressive memory loss. Update 11/13/2019 : He returns for follow-up after last visit 6 weeks ago.  Is accompanied by his wife.  Patient states has not had any seizure-like episode however about a month ago he had a minor spell when he was sitting in the porch outside his house when his wife noticed him to be slightly confused and disoriented.  He had trouble remembering the names of one of the neighbors.  This lasted barely a few minutes and returned back to baseline.  He denies any accompanying headache, tongue bite, injury or incontinence.  The patient continues  to have short-term memory difficulties which are unchanged.  He often misplaces his phone.  He is not participating in any cognitively challenging activities.  Is tolerating aspirin well without bruising or bleeding.  He takes fish oil every day.  He has no new complaints.  He did have EEG done on 11/01/2019 which was normal. Update 01/23/2020 : He is seen today for urgent follow-up visit following recent ER visit to [facility] on 12/11/2019 for possibly unwitnessed seizure.  Patient's  wife woke up in the middle of the night with the patient unresponsive but having tonic activity of both upper extremities which were raised about his body.  He had drooling from his mouth.  He was not answering questions.  She called 911.  Patient was found to be gradually waking up and does not remember till he was in the ER.  He was thought to have an unwitnessed seizure with postictal state and started on Keppra.  Patient seems to be tolerating find a milligrams twice daily well without any side effects.  He has had no further episodes of unresponsiveness or seizure-like activity.  He has not been driving.  He has no family tree of seizures.  States his short-term memory difficulties are unchanged and are not getting worse.  He has no new complaints. Update 07/23/2020 : He returns for follow-up after last visit 6 months ago.  Is accompanied by his wife.  Patient states is done well.  He has no recurrent seizures.  Is tolerating Keppra and the current dose of 500 mg twice daily well without any side effects.  He did have a follow-up EEG on 03/19/2020 which was normal.  He has started driving and has had no issues.  He has no new complaints today. Update 02/20/2021: Patient is seen today upon request as he had some breakthrough seizures.  He was vacationing in [place] when on 01/30/2021 he had 3 back-to-back seizures at home and 1 later at the hospital.  The wife was eyewitness describes a seizure was typical with the patient drawing up his left arm and becoming stiff and unresponsive.  Seizure activity lasted for minutes.  He was quite confused postictally and restless and agitated.  He had to be hospitalized for 5 days and treated with antibiotics for his UTI.  He was hallucinating.  He was continued on Keppra home dose of 5 mg twice daily.  He states is done well since then and had no further breakthrough seizures.  Patient admits that he had a hectic travel and had probably been sleep deprived prior to his  seizure breakthrough.  He continues to have mild short-term memory difficulties but these appear to be unchanged and not progressive.  He does play solitaire and does some cognitive challenging activities.  He has no new complaints today. Update 09/18/2021 : He returns for follow-up after last visit 6 months ago.  He states he is doing well.  He has had no further seizures since increasing the dose of Keppra to 500 mg in the morning and 1 gram at night.  He is tolerating the increased dose without any side effects.  He has also started using CPAP for his sleep apnea and is sleeping a lot better.  He was seen at [facility] for his seizures for a second opinion and saw Dr. Virgia Land on 09/03/2021 who added Nayzilam 5 mg nasal spray but agreed with current medication regimen of Keppra.  He continues to have mild short-term memory difficulties which are  unchanged and not progressive and do not interfere with his day-to-day activities.  He does do daily puzzles.  He has no new complaints. Update 03/30/2022 : He returns for follow-up after last visit 6 months ago.  He states he has had no further seizures.  He remains on Keppra 500 mg in the morning and 1 gm at night and is tolerating it well without any side effects.  He continues to have mild short-term memory difficulties which she feels are unchanged and stable and not necessarily getting worse.  He has not been participating in any regular activities for stress relaxation.  He denies any new complaints.  He had follow-up appointment with Dr. Brett Fairy on 09/23/2021.  He has no new complaints today. 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   by dr Caryl Comes for documented SND,  St Jude dual chamber   GERD (gastroesophageal reflux disease)    History of adenomatous  polyp of colon    History of basal cell carcinoma (BCC) excision    forehead and nose area 2015, 2016   History of squamous cell carcinoma excision    excision bilateral temple area  2015, 2016, 2019   History of syncope 2005   2005  w/ recurrent ;  documented sinus node dysfunction,   s/p PPM 2011  (03-20-2022  pt stated many yrs since last syncope)   History of transient ischemic attack (TIA)    Hyperlipidemia    Hypertension    Mild cognitive impairment    Nocturia    OA (osteoarthritis)    OSA on CPAP    followed by dr Brett Fairy;   study in epic 06-23-2021 severe complex apnea syndrome,   pt stated usese cpap nightly   RLS (restless legs syndrome)    Rotator cuff tear arthropathy, right    Seizure disorder [facility]) 2021   neurologist--- dr Leonie Man;   started 2021 nocturnal seizures;   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, not intractable , without status epilepticus  (03-20-2022  pt stated last seizure 01/ 2023)   Sinus node dysfunction [facility]) 2010   cardiologist--- dr Caryl Comes;  hx recurrent syncope;   04/ 2010  loop recorder implant w/ documented SND;   01-27-2010  s/p PPM dual chamber , St Jude   Wears glasses    Wears hearing aid in both ears     Social History:  Social History   Socioeconomic History   Marital status: Married    Spouse name: Peggy   Number of children: 1   Years of education: Not on file   Highest education level: Bachelor's degree (e.g., BA, AB, BS)  Occupational History   Occupation: CO-OWNER    Employer: [employer]  Tobacco Use   Smoking status: Former    Years: 25.00    Types: Cigarettes    Quit date: 1982    Years since quitting: 41.9   Smokeless tobacco: Never  Vaping Use   Vaping Use: Never used  Substance and Sexual Activity   Alcohol use: Yes    Alcohol/week: 14.0 standard drinks of alcohol    Types: 14 Cans of beer per week    Comment: 03-20-2022 2 beers daily , 12 oz each    Drug use: Yes  Frequency: 7.0 times per week    Types: Marijuana    Comment: 03/22/2022   Sexual activity: Not on file  Other Topics Concern   Not on file  Social History Narrative   Lives with spouse   Right Handed   3-4 c caffeine    Social Determinants of Health   Financial Resource Strain: Low Risk  (05/23/2019)   Overall Financial Resource Strain (CARDIA)    Difficulty of Paying Living Expenses: Not hard at all  Food Insecurity: No Food Insecurity (05/23/2019)   Hunger Vital Sign    Worried About Running Out of Food in the Last Year: Never true    Ran Out of Food in the Last Year: Never true  Transportation Needs: No Transportation Needs (05/23/2019)   PRAPARE - Hydrologist (Medical): No    Lack of Transportation (Non-Medical): No  Physical Activity: Sufficiently Active (05/23/2019)   Exercise Vital Sign    Days of Exercise per Week: 7 days    Minutes of Exercise per Session: 90 min  Stress: No Stress Concern Present (05/23/2019)   [place]    Feeling of Stress : Only a little  Social Connections: Unknown (05/23/2019)   Social Connection and Isolation Panel [NHANES]    Frequency of Communication with Friends and Family: More than three times a week    Frequency of Social Gatherings with Friends and Family: Once a week    Attends Religious Services: Not on Advertising copywriter or Organizations: Not on file    Attends Archivist Meetings: Not on file    Marital Status: Married  Intimate Partner Violence: Not At Risk (04/18/2021)   Humiliation, Afraid, Rape, and Kick questionnaire    Fear of Current or Ex-Partner: No    Emotionally Abused: No    Physically Abused: No    Sexually Abused: No    Medications:   Current Outpatient Medications on File Prior to Visit  Medication Sig Dispense Refill   acetaminophen (TYLENOL) 650 MG CR tablet Take 650 mg by mouth  every 8 (eight) hours as needed for pain.     Ascorbic Acid (VITAMIN C PO) Take 1,000 mg by mouth daily.     benazepril (LOTENSIN) 40 MG tablet TAKE 1 TABLET BY MOUTH EVERY DAY (Patient taking differently: Take 40 mg by mouth daily.) 90 tablet 2   cholecalciferol (VITAMIN D3) 25 MCG (1000 UNIT) tablet Take 1,000 Units by mouth daily.     Coenzyme Q10 (COQ10 PO) Take 1,000 mg by mouth daily.     Glycerin-Polysorbate 80 (REFRESH DRY EYE THERAPY OP) Apply to eye as needed.     levETIRAcetam (KEPPRA) 500 MG tablet Take one tablet in am and two tablets in pm. (Patient taking differently: Take 500-1,000 mg by mouth 2 (two) times daily. Take one tablet in am and two tablets in pm.) 270 tablet 3   Omega-3 Fatty Acids (FISH OIL) 1000 MG CPDR Take 1 capsule by mouth every morning. 30 capsule 1   rosuvastatin (CRESTOR) 5 MG tablet TAKE 1 TABLET BY MOUTH EVERY DAY (Patient taking differently: Take 2.5 mg by mouth at bedtime.) 90 tablet 3   traZODone (DESYREL) 100 MG tablet Take 1 tablet (100 mg total) by mouth at bedtime as needed for sleep. 90 tablet 1   Turmeric (QC TUMERIC COMPLEX PO) Take 1 capsule by mouth daily.     No current facility-administered  medications on file prior to visit.    Allergies:  No Known Allergies  Physical Exam General: well developed, well nourished mildly obese elderly Caucasian male, seated, in no evident distress Head: head normocephalic and atraumatic.   Neck: supple with no carotid or supraclavicular bruits Cardiovascular: regular rate and rhythm, no murmurs Musculoskeletal: no deformity.  Wearing a right knee brace Skin:  no rash/petichiae Vascular:  Normal pulses all extremities  Neurologic Exam Mental Status: Awake and fully alert. Oriented to place and time. Recent and remote memory intact. Attention span, concentration and fund of knowledge appropriate. Mood and affect appropriate.  Recall 2/3.  Able to name 13 animals which can walk on 4 legs.  Clock drawing  4/4. Cranial Nerves: Fundoscopic exam not done. Pupils equal, briskly reactive to light. Extraocular movements full without nystagmus. Visual fields full to confrontation. Hearing slightly diminished bilaterally despite hearing aids.. Facial sensation intact. Face, tongue, palate moves normally and symmetrically.  Motor: Normal bulk and tone. Normal strength in all tested extremity muscles. Sensory.: intact to touch , pinprick , position and vibratory sensation.  Coordination: Rapid alternating movements normal in all extremities. Finger-to-nose and heel-to-shin performed accurately bilaterally. Gait and Station: Arises from chair without difficulty. Stance is normal. gait demonstrates normal stride length and balance . Able to heel, toe and tandem walk with only slight difficulty.  Reflexes: 1+ and symmetric. Toes downgoing.     ASSESSMENT: 81 year old Caucasian male with transient episode of loss of consciousness followed by some confusion and disorientation  X 2 now likely unwitnessed seizure with postictal confusion he has had recent breakthrough seizures last month while vacationing in [place] likely related to urinary tract infection and sleep deprivation.  He has longstanding history of short-term memory difficulties due to mild cognitive impairment which appears stable    PLAN: I had a long discussion with the patient and his wife regarding his seizures which presently appear well controlled on the current medication regimen of Keppra 500 mg in the morning and 1000 mg at night.  He may consider using Nayzilam nasal spray for prolonged seizure lasting more than 5 minutes or repetitive seizures.  I again asked him to avoid seizure provoking stimuli like sleep deprivation, medication noncompliance, extremes of activity and dietary changes.  I also advised him to increase participation in cognitively challenging activities like solving crossword puzzles, playing bridge, sudoku and word searches for  his mild cognitive impairment which also appears stable.  He also discussed memory compensation strategies.  He will return for follow-up in the future in 6 months or call earlier if necessary. Greater than 50% time during this 35 minute visit was spent on counseling and coordination of care about his seizures and answering questions.  Antony Contras, MD  [facility] [address] [place], [place] [address]  Phone [phone] Fax [phone] Note: This document was prepared with digital dictation and possible smart phrase technology. Any transcriptional errors that result from this process are unintentional.

## 2022-04-06 ENCOUNTER — Telehealth: Payer: Self-pay | Admitting: Internal Medicine

## 2022-04-06 NOTE — Telephone Encounter (Signed)
Patient would like to know how much time he has remaining on his PPM. Please advise.

## 2022-04-06 NOTE — Telephone Encounter (Signed)
Estimated longevity 3 months.   Patient called and advised. Appreciative of call.

## 2022-04-08 ENCOUNTER — Telehealth: Payer: Self-pay | Admitting: Family Medicine

## 2022-04-08 NOTE — Telephone Encounter (Signed)
Pt called to ask if his Medicare AWV for 04/17/22 is mandatory?  Pt has [employer] Medicare.  Pt would like a call back to discuss.

## 2022-04-08 NOTE — Telephone Encounter (Signed)
Thank you :)

## 2022-04-14 NOTE — Telephone Encounter (Signed)
Patient informed. 

## 2022-04-17 NOTE — Progress Notes (Unsigned)
[name] Phone: [phone] Subjective:    I'm seeing this patient by the request  of:  [name] Post, MD  CC:   [id number]  01/28/2022 Significant arthritic changes with abnormal thigh to calf ratio.  Instability with valgus and varus force noted.  Patient is ambulating.  I do feel that a another custom brace with this 16 nearly 81 years old would be beneficial.  We will see if we can get this approved for him and hopefully this will help him avoid any type of surgical intervention and continuing to stay     Repeat injection today, tolerated the procedure well, discussed icing regimen and home exercises.  Increase activity slowly otherwise.  Follow-up again in10 weeks.  Chronic problem with worsening symptoms.  Patient still wants to avoid any type of surgical intervention at this point.      Update 04/22/2022 [name] [name] is a 81 y.o. male coming in with complaint of R shoulder and R knee pain. 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   by dr Caryl Comes for documented SND,  St Jude dual chamber   GERD (gastroesophageal reflux disease)    History of adenomatous polyp of colon    History of basal cell carcinoma (BCC) excision    forehead and nose area 2015, 2016   History of squamous cell carcinoma excision    excision bilateral temple area  2015, 2016, 2019   History of syncope 2005   2005  w/ recurrent ;  documented sinus node dysfunction,   s/p PPM 2011  (03-20-2022  pt stated many yrs since last syncope)   History of transient ischemic attack (TIA)    Hyperlipidemia    Hypertension    Mild cognitive impairment    Nocturia    OA (osteoarthritis)    OSA on CPAP    followed by dr Brett Fairy;   study in epic 06-23-2021 severe complex apnea syndrome,   pt stated usese cpap nightly   RLS (restless legs syndrome)    Rotator  cuff tear arthropathy, right    Seizure disorder [facility]) 2021   neurologist--- dr Leonie Man;   started 2021 nocturnal seizures;   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, not intractable , without status epilepticus  (03-20-2022  pt stated last seizure 01/ 2023)   Sinus node dysfunction [facility]) 2010   cardiologist--- dr Caryl Comes;  hx recurrent syncope;   04/ 2010  loop recorder implant w/ documented SND;   01-27-2010  s/p PPM dual chamber , St Jude   Wears glasses    Wears hearing aid in both ears    Past Surgical History:  Procedure Laterality Date   CARDIAC PACEMAKER PLACEMENT  01/27/2010   '@[facility]'$  by dr Caryl Comes;   explant loop recorder /  PPM  St Jude dual chamber   COLONOSCOPY WITH PROPOFOL  08/07/2020   dr pyrle   ESOPHAGOGASTRODUODENOSCOPY (EGD) WITH PROPOFOL  02/05/2014   dr pyrtle   LOOP RECORDER IMPLANT  08/17/2008   '@[facility]'$   by dr Caryl Comes;   after tilt table study normal;  for recurrent syncope   TRANSURETHRAL RESECTION OF BLADDER TUMOR WITH MITOMYCIN-C N/A 03/23/2022   Procedure: TRANSURETHRAL RESECTION OF BLADDER TUMOR WITH [name] OPERATIVE GEMCITABINE;  Surgeon: Franchot Gallo, MD;  Location: [facility];  Service: Urology;  Laterality: N/A;  38 MINS   Social History   Socioeconomic History   Marital status: Married    Spouse name: [name] [name]   Number of children: 1   Years of education: Not on file   Highest education level: Bachelor's degree (e.g., BA, AB, BS)  Occupational History   Occupation: CO-OWNER    Employer: [employer]  Tobacco Use   Smoking status: Former    Years: 25.00    Types: Cigarettes    Quit date: 1982    Years since quitting: 42.0   Smokeless tobacco: Never  Vaping Use   Vaping Use: Never used  Substance and Sexual Activity   Alcohol use: Yes    Alcohol/week: 14.0 standard drinks of alcohol    Types: 14 Cans of beer per week    Comment: 03-20-2022 2 beers daily , 12 oz each   Drug use: Yes     Frequency: 7.0 times per week    Types: Marijuana    Comment: 03/22/2022   Sexual activity: Not on file  Other Topics Concern   Not on file  Social History Narrative   Lives with spouse   Right Handed   3-4 c caffeine    Social Determinants of Health   Financial Resource Strain: Low Risk  (05/23/2019)   Overall Financial Resource Strain (CARDIA)    Difficulty of Paying Living Expenses: Not hard at all  Food Insecurity: No Food Insecurity (05/23/2019)   Hunger Vital Sign    Worried About Running Out of Food in the Last Year: Never true    Ran Out of Food in the Last Year: Never true  Transportation Needs: No Transportation Needs (05/23/2019)   PRAPARE - Hydrologist (Medical): No    Lack of Transportation (Non-Medical): No  Physical Activity: Sufficiently Active (05/23/2019)   Exercise Vital Sign    Days of Exercise per Week: 7 days    Minutes of Exercise per Session: 90 min  Stress: No Stress Concern Present (05/23/2019)   [place]    Feeling of Stress : Only a little  Social Connections: Unknown (05/23/2019)   Social Connection and Isolation Panel [NHANES]    Frequency of Communication with Friends and Family: More than three times a week    Frequency of Social Gatherings with Friends and Family: Once a week    Attends Religious Services: Not on Advertising copywriter or Organizations: Not on file    Attends Archivist Meetings: Not on file    Marital Status: Married   No Known Allergies Family History  Problem Relation Age of Onset   Hypertension Mother    Stroke Mother    Colitis Father    Hypertension Father    Other Father        esophageal stricture   Liver disease Father    Alcohol abuse Maternal Aunt    Rectal cancer Neg Hx    Stomach cancer Neg Hx    Esophageal cancer Neg Hx    Colon polyps Neg Hx    Colon cancer Neg Hx      Current Outpatient  Medications (Cardiovascular):    benazepril (LOTENSIN) 40 MG tablet, TAKE 1 TABLET BY MOUTH EVERY DAY (Patient taking differently: Take 40 mg by mouth daily.)   rosuvastatin (CRESTOR) 5 MG tablet, TAKE 1 TABLET BY MOUTH EVERY DAY (Patient taking differently: Take 2.5 mg by mouth at bedtime.)  Current Outpatient Medications (Analgesics):    acetaminophen (TYLENOL) 650 MG CR tablet, Take 650 mg by mouth every 8 (eight) hours as needed for pain.   Current Outpatient Medications (Other):    Ascorbic Acid (VITAMIN C PO), Take 1,000 mg by mouth daily.   cholecalciferol (VITAMIN D3) 25 MCG (1000 UNIT) tablet, Take 1,000 Units by mouth daily.   Coenzyme Q10 (COQ10 PO), Take 1,000 mg by mouth daily.   Glycerin-Polysorbate 80 (REFRESH DRY EYE THERAPY OP), Apply to eye as needed.   levETIRAcetam (KEPPRA) 500 MG tablet, Take one tablet in am and two tablets in pm. (Patient taking differently: Take 500-1,000 mg by mouth 2 (two) times daily. Take one tablet in am and two tablets in pm.)   Omega-3 Fatty Acids (FISH OIL) 1000 MG CPDR, Take 1 capsule by mouth every morning.   traZODone (DESYREL) 100 MG tablet, Take 1 tablet (100 mg total) by mouth at bedtime as needed for sleep.   Turmeric (QC TUMERIC COMPLEX PO), Take 1 capsule by mouth daily.   Reviewed prior external information including notes and imaging from  primary care provider As well as notes that were available from care everywhere and other healthcare systems.  Past medical history, social, surgical and family history all reviewed in electronic medical record.  No pertanent information unless stated regarding to the chief complaint.   Review of Systems:  No headache, visual changes, nausea, vomiting, diarrhea, constipation, dizziness, abdominal pain, skin rash, fevers, chills, night sweats, weight loss, swollen lymph nodes, body aches, joint swelling, chest pain, shortness of breath, mood changes. POSITIVE muscle aches  Objective  There  were no vitals taken for this visit.   General: No apparent distress alert and oriented x3 mood and affect normal, dressed appropriately.  HEENT: Pupils equal, extraocular movements intact  Respiratory: Patient's speak in full sentences and does not appear short of breath  Cardiovascular: No lower extremity edema, non tender, no erythema      Impression and Recommendations:

## 2022-04-22 ENCOUNTER — Ambulatory Visit: Payer: BLUE CROSS/BLUE SHIELD | Admitting: Family Medicine

## 2022-04-22 ENCOUNTER — Ambulatory Visit: Payer: Self-pay

## 2022-04-22 ENCOUNTER — Encounter: Payer: Self-pay | Admitting: Family Medicine

## 2022-04-22 VITALS — BP 122/72 | HR 57 | Ht 67.0 in | Wt 156.0 lb

## 2022-04-22 DIAGNOSIS — M12811 Other specific arthropathies, not elsewhere classified, right shoulder: Secondary | ICD-10-CM | POA: Diagnosis not present

## 2022-04-22 DIAGNOSIS — M25511 Pain in right shoulder: Secondary | ICD-10-CM

## 2022-04-22 DIAGNOSIS — M75101 Unspecified rotator cuff tear or rupture of right shoulder, not specified as traumatic: Secondary | ICD-10-CM | POA: Diagnosis not present

## 2022-04-22 DIAGNOSIS — C672 Malignant neoplasm of lateral wall of bladder: Secondary | ICD-10-CM | POA: Diagnosis not present

## 2022-04-22 NOTE — Assessment & Plan Note (Signed)
Severe rotator cuff arthropathy.  The patient has had this since 2017 and has done relatively well but now worsening symptoms again.  Have been injections in the shoulder but no longer getting any significant benefit.  Discussed with patient about icing regimen and home exercises.  Patient be referred to orthopedic surgery to discuss surgical intervention.  Did discuss with him the only thing left would be PRP but do not know if it would make any significant benefit.  Follow-up with me again as needed.

## 2022-04-22 NOTE — Patient Instructions (Signed)
Good to see you  PRP handout given to look over Referral to Dr. Tamera Punt sent Follow up in 6 weeks

## 2022-04-29 LAB — CUP PACEART REMOTE DEVICE CHECK
Battery Remaining Longevity: 3 mo
Battery Remaining Percentage: 2 %
Battery Voltage: 2.66 V
Brady Statistic AP VP Percent: 1 %
Brady Statistic AP VS Percent: 5.1 %
Brady Statistic AS VP Percent: 1 %
Brady Statistic AS VS Percent: 95 %
Brady Statistic RA Percent Paced: 4.8 %
Brady Statistic RV Percent Paced: 1 %
Date Time Interrogation Session: 20240110081657
Implantable Lead Connection Status: 753985
Implantable Lead Connection Status: 753985
Implantable Lead Implant Date: 20111010
Implantable Lead Implant Date: 20111010
Implantable Lead Location: 753859
Implantable Lead Location: 753860
Implantable Pulse Generator Implant Date: 20111010
Lead Channel Impedance Value: 280 Ohm
Lead Channel Impedance Value: 290 Ohm
Lead Channel Pacing Threshold Amplitude: 0.625 V
Lead Channel Pacing Threshold Amplitude: 1.25 V
Lead Channel Pacing Threshold Pulse Width: 0.4 ms
Lead Channel Pacing Threshold Pulse Width: 0.4 ms
Lead Channel Sensing Intrinsic Amplitude: 2.4 mV
Lead Channel Sensing Intrinsic Amplitude: 4.2 mV
Lead Channel Setting Pacing Amplitude: 1.5 V
Lead Channel Setting Pacing Amplitude: 1.625
Lead Channel Setting Pacing Pulse Width: 0.4 ms
Lead Channel Setting Sensing Sensitivity: 2 mV
Pulse Gen Model: 2110
Pulse Gen Serial Number: 7156802

## 2022-04-30 ENCOUNTER — Ambulatory Visit (INDEPENDENT_AMBULATORY_CARE_PROVIDER_SITE_OTHER): Payer: Self-pay

## 2022-04-30 ENCOUNTER — Other Ambulatory Visit: Payer: Self-pay | Admitting: Urology

## 2022-04-30 DIAGNOSIS — I495 Sick sinus syndrome: Secondary | ICD-10-CM

## 2022-05-04 NOTE — Progress Notes (Signed)
Remote pacemaker transmission.   

## 2022-05-06 ENCOUNTER — Encounter: Payer: Self-pay | Admitting: Internal Medicine

## 2022-05-06 NOTE — Progress Notes (Signed)
PERIOPERATIVE PRESCRIPTION FOR IMPLANTED CARDIAC DEVICE PROGRAMMING  Patient Information: Name:  EUGINE BUBB  DOB:  1941-02-09  MRN:  [id number]    Planned Procedure:  transurethral resection of bladder tumor  Surgeon:  Dr. Franchot Gallo  Date of Procedure:  05/14/22  Cautery will be used.  Position during surgery:    Please send documentation back to:[facility] PST fax # [phone]   Device Information:  Clinic EP Physician:  Virl Axe, MD   Device Type:  Pacemaker Manufacturer and Phone #:  St. Jude/Abbott: [phone] Pacemaker Dependent?:  No. Date of Last Device Check:  04/29/2022 Normal Device Function?:  Yes.    Electrophysiologist's Recommendations:  Have magnet available. Provide continuous ECG monitoring when magnet is used or reprogramming is to be performed.  Procedure should not interfere with device function.  No device programming or magnet placement needed.  Per Device Clinic Standing Orders, Wanda Plump, RN  2:15 PM 05/06/2022

## 2022-05-06 NOTE — Patient Instructions (Signed)
DUE TO COVID-19 ONLY TWO VISITORS  (aged 80 and older)  ARE ALLOWED TO COME WITH YOU AND STAY IN THE WAITING ROOM ONLY DURING PRE OP AND PROCEDURE.   **NO VISITORS ARE ALLOWED IN THE SHORT STAY AREA OR RECOVERY ROOM!!**  IF YOU WILL BE ADMITTED INTO THE HOSPITAL YOU ARE ALLOWED ONLY FOUR SUPPORT PEOPLE DURING VISITATION HOURS ONLY (7 AM -8PM)   The support person(s) must pass our screening, gel in and out, and wear a mask at all times, including in the patient's room. Patients must also wear a mask when staff or their support person are in the room. Visitors GUEST BADGE MUST BE WORN VISIBLY  One adult visitor may remain with you overnight and MUST be in the room by 8 P.M.     Your procedure is scheduled on: 05/14/22   Report to [facility] Main Entrance    Report to admitting at : 6:00 AM   Call this number if you have problems the morning of surgery [phone]   Do not eat food :After Midnight.   After Midnight you may have the following liquids until : 5:00 AM DAY OF SURGERY  Water Black Coffee (sugar ok, NO MILK/CREAM OR CREAMERS)  Tea (sugar ok, NO MILK/CREAM OR CREAMERS) regular and decaf                             Plain Jell-O (NO RED)                                           Fruit ices (not with fruit pulp, NO RED)                                     Popsicles (NO RED)                                                                  Juice: apple, WHITE grape, WHITE cranberry Sports drinks like Gatorade (NO RED)             Oral Hygiene is also important to reduce your risk of infection.                                    Remember - BRUSH YOUR TEETH THE MORNING OF SURGERY WITH YOUR REGULAR TOOTHPASTE  DENTURES WILL BE REMOVED PRIOR TO SURGERY PLEASE DO NOT APPLY "Poly grip" OR ADHESIVES!!!   Do NOT smoke after Midnight   Take these medicines the morning of surgery with A SIP OF WATER: kepra.                              You may not have any metal on your body  including hair pins, jewelry, and body piercing             Do not wear lotions, powders, perfumes/cologne, or deodorant  Men may shave face and neck.   Do not bring valuables to the hospital. [place].   Contacts, glasses, or bridgework may not be worn into surgery.   Bring small overnight bag day of surgery.   DO NOT [place]. PHARMACY WILL DISPENSE MEDICATIONS LISTED ON YOUR MEDICATION LIST TO YOU DURING YOUR ADMISSION [place]!    Patients discharged on the day of surgery will not be allowed to drive home.  Someone NEEDS to stay with you for the first 24 hours after anesthesia.   Special Instructions: Bring a copy of your healthcare power of attorney and living will documents         the day of surgery if you haven't scanned them before.              Please read over the following fact sheets you were given: IF YOU HAVE QUESTIONS ABOUT YOUR PRE-OP INSTRUCTIONS PLEASE CALL [phone]    [facility] - Preparing for Surgery Before surgery, you can play an important role.  Because skin is not sterile, your skin needs to be as free of germs as possible.  You can reduce the number of germs on your skin by washing with CHG (chlorahexidine gluconate) soap before surgery.  CHG is an antiseptic cleaner which kills germs and bonds with the skin to continue killing germs even after washing. Please DO NOT use if you have an allergy to CHG or antibacterial soaps.  If your skin becomes reddened/irritated stop using the CHG and inform your nurse when you arrive at Short Stay. Do not shave (including legs and underarms) for at least 48 hours prior to the first CHG shower.  You may shave your face/neck. Please follow these instructions carefully:  1.  Shower with CHG Soap the night before surgery and the  morning of Surgery.  2.  If you choose to wash your hair, wash your hair first as usual with  your  normal  shampoo.  3.  After you shampoo, rinse your hair and body thoroughly to remove the  shampoo.                           4.  Use CHG as you would any other liquid soap.  You can apply chg directly  to the skin and wash                       Gently with a scrungie or clean washcloth.  5.  Apply the CHG Soap to your body ONLY FROM THE NECK DOWN.   Do not use on face/ open                           Wound or open sores. Avoid contact with eyes, ears mouth and genitals (private parts).                       Wash face,  Genitals (private parts) with your normal soap.             6.  Wash thoroughly, paying special attention to the area where your surgery  will be performed.  7.  Thoroughly rinse your body with warm water from the neck down.  8.  DO  NOT shower/wash with your normal soap after using and rinsing off  the CHG Soap.                9.  Pat yourself dry with a clean towel.            10.  Wear clean pajamas.            11.  Place clean sheets on your bed the night of your first shower and do not  sleep with pets. Day of Surgery : Do not apply any lotions/deodorants the morning of surgery.  Please wear clean clothes to the hospital/surgery center.  FAILURE TO FOLLOW THESE INSTRUCTIONS MAY RESULT IN THE CANCELLATION OF YOUR SURGERY PATIENT SIGNATURE_________________________________  NURSE SIGNATURE__________________________________  ________________________________________________________________________

## 2022-05-07 ENCOUNTER — Encounter (HOSPITAL_COMMUNITY): Payer: Self-pay

## 2022-05-07 ENCOUNTER — Other Ambulatory Visit: Payer: Self-pay

## 2022-05-07 ENCOUNTER — Encounter (HOSPITAL_COMMUNITY)
Admission: RE | Admit: 2022-05-07 | Discharge: 2022-05-07 | Disposition: A | Discharge status: Home / Self Care | Payer: Medicare Other | Source: Ambulatory Visit | Attending: Urology | Admitting: Urology

## 2022-05-07 VITALS — BP 146/71 | HR 59 | Temp 97.7°F | Ht 67.0 in | Wt 155.0 lb

## 2022-05-07 DIAGNOSIS — Z01812 Encounter for preprocedural laboratory examination: Secondary | ICD-10-CM | POA: Diagnosis not present

## 2022-05-07 DIAGNOSIS — I10 Essential (primary) hypertension: Secondary | ICD-10-CM | POA: Diagnosis not present

## 2022-05-07 HISTORY — DX: Malignant (primary) neoplasm, unspecified: C80.1

## 2022-05-07 HISTORY — DX: Transient cerebral ischemic attack, unspecified: G45.9

## 2022-05-07 HISTORY — DX: Presence of cardiac pacemaker: Z95.0

## 2022-05-07 LAB — CBC
HCT: 42.1 % (ref 39.0–52.0)
Hemoglobin: 13.9 g/dL (ref 13.0–17.0)
MCH: 31.7 pg (ref 26.0–34.0)
MCHC: 33 g/dL (ref 30.0–36.0)
MCV: 95.9 fL (ref 80.0–100.0)
Platelets: 168 10*3/uL (ref 150–400)
RBC: 4.39 MIL/uL (ref 4.22–5.81)
RDW: 13.2 % (ref 11.5–15.5)
WBC: 7.3 10*3/uL (ref 4.0–10.5)
nRBC: 0 % (ref 0.0–0.2)

## 2022-05-07 LAB — BASIC METABOLIC PANEL
Anion gap: 8 (ref 5–15)
BUN: 15 mg/dL (ref 8–23)
CO2: 24 mmol/L (ref 22–32)
Calcium: 8.8 mg/dL — ABNORMAL LOW (ref 8.9–10.3)
Chloride: 100 mmol/L (ref 98–111)
Creatinine, Ser: 0.77 mg/dL (ref 0.61–1.24)
GFR, Estimated: >60 mL/min (ref >60)
Glucose, Bld: 97 mg/dL (ref 70–99)
Potassium: 4.3 mmol/L (ref 3.5–5.1)
Sodium: 132 mmol/L — ABNORMAL LOW (ref 135–145)

## 2022-05-07 NOTE — Progress Notes (Signed)
For Short Stay: [place] appointment date:  Bowel Prep reminder:   For Anesthesia: PCP - Dr. Carolann Littler Cardiologist - Dr. Virl Axe.   Chest x-ray -  EKG - 12/02/21 Stress Test -  ECHO -  Cardiac Cath -  Pacemaker/ICD device last checked:04/29/22 Pacemaker orders received:Yes Device Rep notified: N/A  Spinal Cord Stimulator:  Sleep Study - Yes CPAP - Yes  Fasting Blood Sugar -  Checks Blood Sugar _____ times a day Date and result of last Hgb A1c-  Last dose of GLP1 agonist-  GLP1 instructions:   Last dose of SGLT-2 inhibitors-  SGLT-2 instructions:   Blood Thinner Instructions: Aspirin Instructions: Last Dose:  Activity level: Can go up a flight of stairs and activities of daily living without stopping and without chest pain and/or shortness of breath   Able to exercise without chest pain and/or shortness of breath  Anesthesia review: Hx: HTN,TIA's,Pacemaker,OSA(CPAP),Seizures: last one 04/2021  Patient denies shortness of breath, fever, cough and chest pain at PAT appointment   Patient verbalized understanding of instructions that were given to them at the PAT appointment. Patient was also instructed that they will need to review over the PAT instructions again at home before surgery.

## 2022-05-13 NOTE — Anesthesia Preprocedure Evaluation (Signed)
Anesthesia Evaluation  Patient identified by MRN, date of birth, ID band Patient awake    Reviewed: Allergy & Precautions, NPO status , Patient's Chart, lab work & pertinent test results  Airway Mallampati: II  TM Distance: >3 FB Neck ROM: Full    Dental no notable dental hx. (+) Dental Advisory Given   Pulmonary sleep apnea and Continuous Positive Airway Pressure Ventilation , Patient abstained from smoking., former smoker   Pulmonary exam normal        Cardiovascular hypertension, Pt. on medications Normal cardiovascular exam+ pacemaker      Neuro/Psych Seizures -, Well Controlled,     GI/Hepatic Neg liver ROS,GERD  ,,  Endo/Other  negative endocrine ROS    Renal/GU negative Renal ROS     Musculoskeletal  (+) Arthritis ,    Abdominal   Peds  Hematology negative hematology ROS (+)   Anesthesia Other Findings   Reproductive/Obstetrics                             Anesthesia Physical Anesthesia Plan  ASA: 2  Anesthesia Plan: General   Post-op Pain Management: Tylenol PO (pre-op)*   Induction: Intravenous  PONV Risk Score and Plan: 2 and Dexamethasone, Ondansetron and Treatment may vary due to age or medical condition  Airway Management Planned: LMA and Oral ETT  Additional Equipment: None  Intra-op Plan:   Post-operative Plan: Extubation in OR  Informed Consent: I have reviewed the patients History and Physical, chart, labs and discussed the procedure including the risks, benefits and alternatives for the proposed anesthesia with the patient or authorized representative who has indicated his/her understanding and acceptance.     Dental advisory given  Plan Discussed with: CRNA and Anesthesiologist  Anesthesia Plan Comments:        Anesthesia Quick Evaluation

## 2022-05-13 NOTE — H&P (Signed)
H&P  Chief Complaint: Bladder cancer  History of Present Illness: "[name] [name]" is here for repeat TURBT. Initial TURBT on 12.5.2023. He had a 1.5 cm Rt anterolateral wall and a smaller dome lesion. Path--HG NMIBC.   He returns for 2nd stage procedure.   Past Surgical History:  Procedure Laterality Date   CARDIAC PACEMAKER PLACEMENT  01/27/2010   '@[facility]'$  by dr Caryl Comes;   explant loop recorder /  PPM  St Jude dual chamber   COLONOSCOPY WITH PROPOFOL  08/07/2020   dr pyrle   ESOPHAGOGASTRODUODENOSCOPY (EGD) WITH PROPOFOL  02/05/2014   dr pyrtle   LOOP RECORDER IMPLANT  08/17/2008   '@[facility]'$   by dr Caryl Comes;   after tilt table study normal;  for recurrent syncope   TRANSURETHRAL RESECTION OF BLADDER TUMOR WITH MITOMYCIN-C N/A 03/23/2022   Procedure: TRANSURETHRAL RESECTION OF BLADDER TUMOR WITH POST OPERATIVE GEMCITABINE;  Surgeon: Franchot Gallo, MD;  Location: [facility];  Service: Urology;  Laterality: N/A;  45 MINS    Home Medications:  Allergies as of 05/13/2022   No Known Allergies      Medication List      Notice   Cannot display discharge medications because the patient has not yet been admitted.     Allergies: No Known Allergies  Family History  Problem Relation Age of Onset   Hypertension Mother    Stroke Mother    Colitis Father    Hypertension Father    Other Father        esophageal stricture   Liver disease Father    Alcohol abuse Maternal Aunt    Rectal cancer Neg Hx    Stomach cancer Neg Hx    Esophageal cancer Neg Hx    Colon polyps Neg Hx    Colon cancer Neg Hx     Social History:  reports that he quit smoking about 42 years ago. His smoking use included cigarettes. He has never used smokeless tobacco. He reports current alcohol use of about 14.0 standard drinks of alcohol per week. He reports current drug use. Frequency: 7.00 times per week. Drug: Marijuana.  ROS: A complete review of systems was performed.  All systems are negative except for  pertinent findings as noted.  Physical Exam:  Vital signs in last 24 hours: There were no vitals taken for this visit. 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

## 2022-05-14 ENCOUNTER — Encounter (HOSPITAL_COMMUNITY): Payer: Self-pay | Admitting: Urology

## 2022-05-14 ENCOUNTER — Other Ambulatory Visit: Payer: Self-pay

## 2022-05-14 ENCOUNTER — Ambulatory Visit (HOSPITAL_BASED_OUTPATIENT_CLINIC_OR_DEPARTMENT_OTHER): Payer: Medicare Other | Admitting: Anesthesiology

## 2022-05-14 ENCOUNTER — Ambulatory Visit (HOSPITAL_COMMUNITY): Payer: Medicare Other | Admitting: Physician Assistant

## 2022-05-14 ENCOUNTER — Ambulatory Visit (HOSPITAL_COMMUNITY)
Admission: RE | Admit: 2022-05-14 | Discharge: 2022-05-14 | Disposition: A | Discharge status: Home / Self Care | Payer: Medicare Other | Attending: Urology | Admitting: Urology

## 2022-05-14 ENCOUNTER — Encounter (HOSPITAL_COMMUNITY)
Admission: RE | Disposition: A | Discharge status: Home / Self Care | Payer: Self-pay | Source: Home / Self Care | Attending: Urology

## 2022-05-14 DIAGNOSIS — C679 Malignant neoplasm of bladder, unspecified: Secondary | ICD-10-CM

## 2022-05-14 DIAGNOSIS — G40909 Epilepsy, unspecified, not intractable, without status epilepticus: Secondary | ICD-10-CM | POA: Insufficient documentation

## 2022-05-14 DIAGNOSIS — Z87891 Personal history of nicotine dependence: Secondary | ICD-10-CM | POA: Insufficient documentation

## 2022-05-14 DIAGNOSIS — G4730 Sleep apnea, unspecified: Secondary | ICD-10-CM | POA: Insufficient documentation

## 2022-05-14 DIAGNOSIS — I10 Essential (primary) hypertension: Secondary | ICD-10-CM | POA: Diagnosis not present

## 2022-05-14 DIAGNOSIS — D494 Neoplasm of unspecified behavior of bladder: Secondary | ICD-10-CM | POA: Diagnosis not present

## 2022-05-14 DIAGNOSIS — C678 Malignant neoplasm of overlapping sites of bladder: Secondary | ICD-10-CM

## 2022-05-14 HISTORY — PX: TRANSURETHRAL RESECTION OF BLADDER TUMOR WITH MITOMYCIN-C: SHX6459

## 2022-05-14 SURGERY — TRANSURETHRAL RESECTION OF BLADDER TUMOR WITH MITOMYCIN-C
Anesthesia: General

## 2022-05-14 MED ORDER — LIDOCAINE HCL (PF) 2 % IJ SOLN
INTRAMUSCULAR | Status: AC
  Filled 2022-05-14: qty 5

## 2022-05-14 MED ORDER — ONDANSETRON HCL 4 MG/2ML IJ SOLN
INTRAMUSCULAR | Status: AC
  Filled 2022-05-14: qty 2

## 2022-05-14 MED ORDER — FENTANYL CITRATE (PF) 100 MCG/2ML IJ SOLN
INTRAMUSCULAR | Status: DC | PRN
  Administered 2022-05-14 (×2): 50 ug via INTRAVENOUS

## 2022-05-14 MED ORDER — GEMCITABINE CHEMO FOR BLADDER INSTILLATION 2000 MG
2000.0000 mg | Freq: Once | INTRAVENOUS | Status: AC
  Administered 2022-05-14: 2000 mg via INTRAVESICAL
  Filled 2022-05-14: qty 52.6

## 2022-05-14 MED ORDER — ORAL CARE MOUTH RINSE: 15.0000 mL | Freq: Once | OROMUCOSAL | Status: AC

## 2022-05-14 MED ORDER — DEXAMETHASONE SODIUM PHOSPHATE 10 MG/ML IJ SOLN
INTRAMUSCULAR | Status: AC
  Filled 2022-05-14: qty 2

## 2022-05-14 MED ORDER — AMISULPRIDE (ANTIEMETIC) 5 MG/2ML IV SOLN: 10.0000 mg | Freq: Once | INTRAVENOUS | Status: DC | PRN

## 2022-05-14 MED ORDER — LACTATED RINGERS IV SOLN: INTRAVENOUS | Status: DC

## 2022-05-14 MED ORDER — DEXAMETHASONE SODIUM PHOSPHATE 10 MG/ML IJ SOLN
INTRAMUSCULAR | Status: DC | PRN
  Administered 2022-05-14: 10 mg via INTRAVENOUS

## 2022-05-14 MED ORDER — PROMETHAZINE HCL 25 MG/ML IJ SOLN: 6.2500 mg | INTRAMUSCULAR | Status: DC | PRN

## 2022-05-14 MED ORDER — SODIUM CHLORIDE 0.9 % IR SOLN
Status: DC | PRN
  Administered 2022-05-14: 3000 mL

## 2022-05-14 MED ORDER — STERILE WATER FOR IRRIGATION IR SOLN
Status: DC | PRN
  Administered 2022-05-14: 500 mL

## 2022-05-14 MED ORDER — CHLORHEXIDINE GLUCONATE 0.12 % MT SOLN
15.0000 mL | Freq: Once | OROMUCOSAL | Status: AC
  Administered 2022-05-14: 15 mL via OROMUCOSAL

## 2022-05-14 MED ORDER — LIDOCAINE 2% (20 MG/ML) 5 ML SYRINGE
INTRAMUSCULAR | Status: DC | PRN
  Administered 2022-05-14: 100 mg via INTRAVENOUS

## 2022-05-14 MED ORDER — ONDANSETRON HCL 4 MG/2ML IJ SOLN
INTRAMUSCULAR | Status: DC | PRN
  Administered 2022-05-14: 4 mg via INTRAVENOUS

## 2022-05-14 MED ORDER — MIDAZOLAM HCL 2 MG/2ML IJ SOLN
INTRAMUSCULAR | Status: AC
  Filled 2022-05-14: qty 2

## 2022-05-14 MED ORDER — ACETAMINOPHEN 500 MG PO TABS
1000.0000 mg | ORAL_TABLET | Freq: Once | ORAL | Status: AC
  Administered 2022-05-14: 1000 mg via ORAL
  Filled 2022-05-14: qty 2

## 2022-05-14 MED ORDER — FENTANYL CITRATE PF 50 MCG/ML IJ SOSY: 25.0000 ug | PREFILLED_SYRINGE | INTRAMUSCULAR | Status: DC | PRN

## 2022-05-14 MED ORDER — PROPOFOL 10 MG/ML IV BOLUS
INTRAVENOUS | Status: DC | PRN
  Administered 2022-05-14: 150 mg via INTRAVENOUS

## 2022-05-14 MED ORDER — FENTANYL CITRATE (PF) 100 MCG/2ML IJ SOLN
INTRAMUSCULAR | Status: AC
  Filled 2022-05-14: qty 2

## 2022-05-14 MED ORDER — CEFAZOLIN SODIUM-DEXTROSE 2-4 GM/100ML-% IV SOLN
2.0000 g | INTRAVENOUS | Status: AC
  Administered 2022-05-14: 2 g via INTRAVENOUS
  Filled 2022-05-14: qty 100

## 2022-05-14 MED ORDER — MIDAZOLAM HCL 5 MG/5ML IJ SOLN
INTRAMUSCULAR | Status: DC | PRN
  Administered 2022-05-14: 2 mg via INTRAVENOUS

## 2022-05-14 SURGICAL SUPPLY — 22 items
BAG DRN RND TRDRP ANRFLXCHMBR (UROLOGICAL SUPPLIES) ×1
BAG URINE DRAIN 2000ML AR STRL (UROLOGICAL SUPPLIES) IMPLANT
BAG URO CATCHER STRL LF (MISCELLANEOUS) ×2 IMPLANT
CATH FOLEY 2WAY SLVR  5CC 16FR (CATHETERS) ×1
CATH FOLEY 2WAY SLVR 30CC 24FR (CATHETERS) IMPLANT
CATH FOLEY 2WAY SLVR 5CC 16FR (CATHETERS) IMPLANT
DRAPE FOOT SWITCH (DRAPES) ×2 IMPLANT
EVACUATOR MICROVAS BLADDER (UROLOGICAL SUPPLIES) IMPLANT
GLOVE SURG LX STRL 8.0 MICRO (GLOVE) ×2 IMPLANT
GOWN STRL REUS W/ TWL XL LVL3 (GOWN DISPOSABLE) ×2 IMPLANT
GOWN STRL REUS W/TWL XL LVL3 (GOWN DISPOSABLE) ×1
KIT TURNOVER KIT A (KITS) IMPLANT
LOOP CUT BIPOLAR 24F LRG (ELECTROSURGICAL) ×2 IMPLANT
MANIFOLD NEPTUNE II (INSTRUMENTS) ×2 IMPLANT
NDL SAFETY ECLIP 18X1.5 (MISCELLANEOUS) ×2 IMPLANT
PACK CYSTO (CUSTOM PROCEDURE TRAY) ×2 IMPLANT
PLUG CATH AND CAP STER (CATHETERS) IMPLANT
SYR TOOMEY IRRIG 70ML (MISCELLANEOUS) ×1
SYRINGE TOOMEY IRRIG 70ML (MISCELLANEOUS) IMPLANT
TUBING CONNECTING 10 (TUBING) ×2 IMPLANT
TUBING UROLOGY SET (TUBING) ×2 IMPLANT
WATER STERILE IRR 3000ML UROMA (IV SOLUTION) ×2 IMPLANT

## 2022-05-14 NOTE — Discharge Instructions (Signed)
You may see some blood in the urine and may have some burning with urination for 48-72 hours. You also may notice that you have to urinate more frequently or urgently after your procedure which is normal.  You should call should you develop an inability urinate, fever > 101, persistent nausea and vomiting that prevents you from eating or drinking to stay hydrated. If you have a catheter, you will be taught how to take care of the catheter by the nursing staff prior to discharge from the hospital.  You may periodically feel a strong urge to void with the catheter in place.  This is a bladder spasm and most often can occur when having a bowel movement or moving around. It is typically self-limited and usually will stop after a few minutes.  You may use some Vaseline or Neosporin around the tip of the catheter to reduce friction at the tip of the penis. You may also see some blood in the urine.  A very small amount of blood can make the urine look quite red.  As long as the catheter is draining well, there usually is not a problem.  However, if the catheter is not draining well and is bloody, you should call the office [phone]) to notify us.  It is okay to remove the catheter on Friday morning as instructed by nurses.

## 2022-05-14 NOTE — Transfer of Care (Signed)
Immediate Anesthesia Transfer of Care Note  Patient: [name] [name]  Procedure(s) Performed: TRANSURETHRAL RESECTION OF BLADDER TUMOR WITH GEMCITABINE  Patient Location: PACU  Anesthesia Type:General  Level of Consciousness: sedated, drowsy, and patient cooperative  Airway & Oxygen Therapy: Patient Spontanous Breathing  Post-op Assessment: Report given to RN and Post -op Vital signs reviewed and stable  Post vital signs: stable  Last Vitals:  Vitals Value Taken Time  BP 146/66 05/14/22 0845  Temp    Pulse 50 05/14/22 0845  Resp 10 05/14/22 0845  SpO2 100 % 05/14/22 0845  Vitals shown include unvalidated device data.  Last Pain:  Vitals:   05/14/22 0623  TempSrc: Oral  PainSc: 0-No pain      Patients Stated Pain Goal: 3 ([id number])  Complications: No notable events documented.

## 2022-05-14 NOTE — Op Note (Signed)
Preoperative diagnosis: High-grade nonmuscle invasive bladder cancer, status post recent prior resection  Postoperative diagnosis: Same  Principal procedure: Cystoscopy, repeat transurethral resection of bladder tumor, 2 cm, placement of intravesical gemcitabine  Surgeon: Kingsley Herandez  Anesthesia: General with LMA  Complications: None  Estimated blood loss: Less than 5 mL  Specimen: Bladder tumor site  Indications: 82 year old male status post recent resection of a 2 cm left anterior bladder wall tumor.  Pathology revealed high-grade nonmuscle invasive disease.  He presents at this time for repeat resection according to best practice guidelines.  The procedure, risks and complications have been discussed with the patient.  He understands these and desires to proceed.  Findings: Urethra was normal.  Prostate nonobstructive.  Ureteral orifices were normal in the location and configuration.  Old resection site located in the left bladder wall anteriorly.  No residual papillary lesions remaining.  There were no other urothelial abnormalities of the bladder.  Description of procedure: Patient properly identified in the holding area, taken to the operating room where general anesthetic was administered with the LMA.  He is placed in the dorsolithotomy position.  Genitalia and perineum were prepped, draped, proper timeout performed.  Urethral meatus dilated to 28 [place] with [employer] sounds.  Using the visual obturator, a 26 French resectoscope sheath was passed.  The bladder was inspected.  The resectoscope and bipolar cutting loop were then placed.  The prior resection site was resected widely.  The area of resection is approximately 2 cm in diameter.  Resection carried into the muscular layer.  Small chips were evacuated from the bladder and sent labeled "bladder tumor site".  Resection site was cauterized.  No bleeding was seen.  At this point the scope was removed.  48 French Foley catheter placed,  balloon filled with 10 cc of water.  Hooked to dependent drainage.  The patient was then awakened and taken to the PACU in stable condition, having tolerated procedure well.  In the 2 g of gemcitabine and diluent was placed and left indwelling for an hour.

## 2022-05-14 NOTE — Anesthesia Postprocedure Evaluation (Signed)
Anesthesia Post Note  Patient: [name] [name]  Procedure(s) Performed: TRANSURETHRAL RESECTION OF BLADDER TUMOR WITH GEMCITABINE     Patient location during evaluation: PACU Anesthesia Type: General Level of consciousness: sedated Pain management: pain level controlled Vital Signs Assessment: post-procedure vital signs reviewed and stable Respiratory status: spontaneous breathing and respiratory function stable Cardiovascular status: stable Postop Assessment: no apparent nausea or vomiting Anesthetic complications: no  No notable events documented.  Last Vitals:  Vitals:   05/14/22 0930 05/14/22 0945  BP: (!) 153/65 (!) 159/71  Pulse: (!) 50 (!) 52  Resp: 12 17  Temp:    SpO2: 100% 100%    Last Pain:  Vitals:   05/14/22 0845  TempSrc:   PainSc: Asleep                 Yanett Conkright DANIEL

## 2022-05-14 NOTE — Interval H&P Note (Signed)
History and Physical Interval Note:  05/14/2022 7:27 AM  [name] [name]  has presented today for surgery, with the diagnosis of BLADDER CANCER.  The various methods of treatment have been discussed with the patient and family. After consideration of risks, benefits and other options for treatment, the patient has consented to  Procedure(s) with comments: TRANSURETHRAL RESECTION OF BLADDER TUMOR WITH GEMCITABINE (N/A) - 30 MINS as a surgical intervention.  The patient's history has been reviewed, patient examined, no change in status, stable for surgery.  I have reviewed the patient's chart and labs.  Questions were answered to the patient's satisfaction.     Lillette Boxer [name] [name]

## 2022-05-14 NOTE — Anesthesia Procedure Notes (Signed)
Procedure Name: LMA Insertion Date/Time: 05/14/2022 8:07 AM  Performed by: Pilar Grammes, CRNAPre-anesthesia Checklist: Patient identified, Emergency Drugs available, Suction available, Patient being monitored and Timeout performed Oxygen Delivery Method: Circle system utilized Preoxygenation: Pre-oxygenation with 100% oxygen Induction Type: IV induction Ventilation: Mask ventilation without difficulty LMA: LMA inserted LMA Size: 4.0 Tube secured with: Tape Dental Injury: Teeth and Oropharynx as per pre-operative assessment

## 2022-05-15 ENCOUNTER — Encounter (HOSPITAL_COMMUNITY): Payer: Self-pay | Admitting: Urology

## 2022-05-15 DIAGNOSIS — C672 Malignant neoplasm of lateral wall of bladder: Secondary | ICD-10-CM | POA: Diagnosis not present

## 2022-05-15 LAB — SURGICAL PATHOLOGY

## 2022-05-20 DIAGNOSIS — M19011 Primary osteoarthritis, right shoulder: Secondary | ICD-10-CM | POA: Diagnosis not present

## 2022-05-20 NOTE — Progress Notes (Signed)
Remote pacemaker transmission.   

## 2022-05-21 ENCOUNTER — Telehealth: Payer: Self-pay | Admitting: Neurology

## 2022-05-21 ENCOUNTER — Other Ambulatory Visit: Payer: Self-pay | Admitting: Orthopedic Surgery

## 2022-05-21 DIAGNOSIS — M25511 Pain in right shoulder: Secondary | ICD-10-CM

## 2022-05-21 MED ORDER — TRAZODONE HCL 100 MG PO TABS: 100.0000 mg | ORAL_TABLET | Freq: Every day | ORAL | 2 refills | Status: DC

## 2022-05-21 NOTE — Telephone Encounter (Signed)
Rx refill sent as requested 

## 2022-05-21 NOTE — Telephone Encounter (Signed)
Pt is requesting a refill for traZODone (DESYREL) 100 MG tablet .  Pharmacy: [facility]

## 2022-05-22 ENCOUNTER — Other Ambulatory Visit: Payer: Self-pay | Admitting: Neurology

## 2022-05-22 DIAGNOSIS — G4733 Obstructive sleep apnea (adult) (pediatric): Secondary | ICD-10-CM | POA: Diagnosis not present

## 2022-05-25 DIAGNOSIS — G4733 Obstructive sleep apnea (adult) (pediatric): Secondary | ICD-10-CM | POA: Diagnosis not present

## 2022-05-27 DIAGNOSIS — H25812 Combined forms of age-related cataract, left eye: Secondary | ICD-10-CM | POA: Diagnosis not present

## 2022-05-31 DIAGNOSIS — G4733 Obstructive sleep apnea (adult) (pediatric): Secondary | ICD-10-CM | POA: Diagnosis not present

## 2022-06-01 ENCOUNTER — Ambulatory Visit: Payer: Medicare Other

## 2022-06-01 DIAGNOSIS — I495 Sick sinus syndrome: Secondary | ICD-10-CM

## 2022-06-02 LAB — CUP PACEART REMOTE DEVICE CHECK
Battery Remaining Longevity: 1 mo
Battery Remaining Percentage: 1 %
Battery Voltage: 2.65 V
Brady Statistic AP VP Percent: 1 %
Brady Statistic AP VS Percent: 4.4 %
Brady Statistic AS VP Percent: 1 %
Brady Statistic AS VS Percent: 95 %
Brady Statistic RA Percent Paced: 4.1 %
Brady Statistic RV Percent Paced: 1 %
Date Time Interrogation Session: 20240212080040
Implantable Lead Connection Status: 753985
Implantable Lead Connection Status: 753985
Implantable Lead Implant Date: 20111010
Implantable Lead Implant Date: 20111010
Implantable Lead Location: 753859
Implantable Lead Location: 753860
Implantable Pulse Generator Implant Date: 20111010
Lead Channel Impedance Value: 260 Ohm
Lead Channel Impedance Value: 300 Ohm
Lead Channel Pacing Threshold Amplitude: 0.75 V
Lead Channel Pacing Threshold Amplitude: 1.375 V
Lead Channel Pacing Threshold Pulse Width: 0.4 ms
Lead Channel Pacing Threshold Pulse Width: 0.4 ms
Lead Channel Sensing Intrinsic Amplitude: 2.8 mV
Lead Channel Sensing Intrinsic Amplitude: 5 mV
Lead Channel Setting Pacing Amplitude: 1.625
Lead Channel Setting Pacing Amplitude: 1.75 V
Lead Channel Setting Pacing Pulse Width: 0.4 ms
Lead Channel Setting Sensing Sensitivity: 2 mV
Pulse Gen Model: 2110
Pulse Gen Serial Number: 7156802

## 2022-06-03 DIAGNOSIS — C672 Malignant neoplasm of lateral wall of bladder: Secondary | ICD-10-CM | POA: Diagnosis not present

## 2022-06-03 DIAGNOSIS — Z5111 Encounter for antineoplastic chemotherapy: Secondary | ICD-10-CM | POA: Diagnosis not present

## 2022-06-10 DIAGNOSIS — Z5111 Encounter for antineoplastic chemotherapy: Secondary | ICD-10-CM | POA: Diagnosis not present

## 2022-06-10 DIAGNOSIS — C672 Malignant neoplasm of lateral wall of bladder: Secondary | ICD-10-CM | POA: Diagnosis not present

## 2022-06-16 DIAGNOSIS — C44329 Squamous cell carcinoma of skin of other parts of face: Secondary | ICD-10-CM | POA: Diagnosis not present

## 2022-06-17 DIAGNOSIS — C672 Malignant neoplasm of lateral wall of bladder: Secondary | ICD-10-CM | POA: Diagnosis not present

## 2022-06-17 DIAGNOSIS — Z5111 Encounter for antineoplastic chemotherapy: Secondary | ICD-10-CM | POA: Diagnosis not present

## 2022-06-18 ENCOUNTER — Ambulatory Visit
Admission: RE | Admit: 2022-06-18 | Discharge: 2022-06-18 | Disposition: A | Discharge status: Home / Self Care | Payer: Medicare Other | Source: Ambulatory Visit | Attending: Orthopedic Surgery | Admitting: Orthopedic Surgery

## 2022-06-18 DIAGNOSIS — M25511 Pain in right shoulder: Secondary | ICD-10-CM | POA: Diagnosis not present

## 2022-06-19 DIAGNOSIS — H269 Unspecified cataract: Secondary | ICD-10-CM | POA: Diagnosis not present

## 2022-06-19 DIAGNOSIS — H25812 Combined forms of age-related cataract, left eye: Secondary | ICD-10-CM | POA: Diagnosis not present

## 2022-06-20 DIAGNOSIS — G4733 Obstructive sleep apnea (adult) (pediatric): Secondary | ICD-10-CM | POA: Diagnosis not present

## 2022-06-23 DIAGNOSIS — G4733 Obstructive sleep apnea (adult) (pediatric): Secondary | ICD-10-CM | POA: Diagnosis not present

## 2022-06-23 NOTE — Progress Notes (Unsigned)
  Cardiology Office Note:   Date:  06/24/2022  ID:  [name] [name], DOB [birth year], MRN [id number]  Primary Cardiologist: None Electrophysiologist: Virl Axe, MD   History of Present Illness:   [name] [name] is a 82 y.o. male with h/o syncope found to have SND, s/p St Jude/Abbott DDD PPM 2011, seizures and HTN who presents today for routine EP follow up in a pt nearing ERI.   Since last visit reports doing very well. He is not dependent, so has not yet reached ERI. Device battery V 2.63 today (ERI at 2.60). He and his wife are getting ready to go to [place] for a month.  I suspect they can go and make it back prior to needing gen change. He will take his monitor with him. He denies symptoms of palpitations, chest pain, shortness of breath, orthopnea, PND, lower extremity edema, claudication, dizziness, presyncope, syncope, bleeding, or neurologic sequela. The patient is tolerating medications without difficulties.    Review of systems complete and found to be negative unless listed in HPI.   Device History: Abbott Dual Chamber PPM implanted 2011 for SND   Studies Reviewed:    PPM Interrogation-  reviewed in detail today,  See PACEART report.  EKG is ordered today. Personal review shows sinus bradycardia at 59 bpm  Risk Assessment/Calculations:             Physical Exam:   VS:  BP 118/70   Pulse (!) 59   Ht '5\' 7"'$  (1.702 m)   Wt 158 lb (71.7 kg)   SpO2 98%   BMI 24.75 kg/m    Wt Readings from Last 3 Encounters:  06/24/22 158 lb (71.7 kg)  05/14/22 155 lb (70.3 kg)  05/07/22 155 lb (70.3 kg)     GEN: Well nourished, well developed in no acute distress NECK: No JVD; No carotid bruits CARDIAC: RRR, no murmurs, rubs, gallops RESPIRATORY:  Clear to auscultation without rales, wheezing or rhonchi  ABDOMEN: Soft, non-tender, non-distended EXTREMITIES:  No edema; No deformity   ASSESSMENT AND PLAN:    SND s/p Abbott PPM  H/o Syncope (in setting of SND) Normal PPM  function See Pace Art report No changes today No syncope s/p pacing  Explained risks and benefits to PPM generator change, including but not limited to bleeding and infection. Pt verbalized understanding and agrees to proceed once ERI is met.   HTN Stable on current regimen   H/o Seizure disorder Continue Keppra per neurology.  He only paces ~ 4% of the time, so will likely have further delay before he reaches ERI. We discussed this at length today, gave him preliminary instructions and soap.  Will make placeholder appt in 3 months. Continue monthly remotes. If he reaches ERI in the next 3 months, can schedule procedure without further office visit.   Disposition:   Follow up with Dr. Caryl Comes as usual post procedure if hits ERI prior to 3 mo f/u.   Signed, Shirley Friar, PA-C

## 2022-06-24 ENCOUNTER — Encounter: Payer: Self-pay | Admitting: Student

## 2022-06-24 ENCOUNTER — Encounter: Payer: Self-pay | Admitting: *Deleted

## 2022-06-24 ENCOUNTER — Ambulatory Visit: Payer: Medicare Other | Attending: Student | Admitting: Student

## 2022-06-24 VITALS — BP 118/70 | HR 59 | Ht 67.0 in | Wt 158.0 lb

## 2022-06-24 DIAGNOSIS — I495 Sick sinus syndrome: Secondary | ICD-10-CM | POA: Diagnosis not present

## 2022-06-24 DIAGNOSIS — Z950 Presence of cardiac pacemaker: Secondary | ICD-10-CM | POA: Diagnosis not present

## 2022-06-24 DIAGNOSIS — C672 Malignant neoplasm of lateral wall of bladder: Secondary | ICD-10-CM | POA: Diagnosis not present

## 2022-06-24 DIAGNOSIS — Z5111 Encounter for antineoplastic chemotherapy: Secondary | ICD-10-CM | POA: Diagnosis not present

## 2022-06-24 LAB — CUP PACEART INCLINIC DEVICE CHECK
Battery Remaining Longevity: 1 mo
Battery Voltage: 2.63 V
Brady Statistic RA Percent Paced: 3.7 %
Brady Statistic RV Percent Paced: 0.07 %
Date Time Interrogation Session: 20240306085328
Implantable Lead Connection Status: 753985
Implantable Lead Connection Status: 753985
Implantable Lead Implant Date: 20111010
Implantable Lead Implant Date: 20111010
Implantable Lead Location: 753859
Implantable Lead Location: 753860
Implantable Pulse Generator Implant Date: 20111010
Lead Channel Impedance Value: 275 Ohm
Lead Channel Impedance Value: 300 Ohm
Lead Channel Pacing Threshold Amplitude: 0.75 V
Lead Channel Pacing Threshold Amplitude: 1.375 V
Lead Channel Pacing Threshold Pulse Width: 0.4 ms
Lead Channel Pacing Threshold Pulse Width: 0.4 ms
Lead Channel Sensing Intrinsic Amplitude: 2.3 mV
Lead Channel Sensing Intrinsic Amplitude: 4.2 mV
Lead Channel Setting Pacing Amplitude: 1.625
Lead Channel Setting Pacing Amplitude: 1.75 V
Lead Channel Setting Pacing Pulse Width: 0.4 ms
Lead Channel Setting Sensing Sensitivity: 2 mV
Pulse Gen Model: 2110
Pulse Gen Serial Number: 7156802

## 2022-06-24 NOTE — Patient Instructions (Signed)
Medication Instructions:  Your physician recommends that you continue on your current medications as directed. Please refer to the Current Medication list given to you today.  *If you need a refill on your cardiac medications before your next appointment, please call your pharmacy*  Lab Work: None ordered If you have labs (blood work) drawn today and your tests are completely normal, you will receive your results only by: [place] (if you have MyChart) OR A paper copy in the mail If you have any lab test that is abnormal or we need to change your treatment, we will call you to review the results.  Follow-Up: At [facility], you and your health needs are our priority.  As part of our continuing mission to provide you with exceptional heart care, we have created designated Provider Care Teams.  These Care Teams include your primary Cardiologist (physician) and Advanced Practice Providers (APPs -  Physician Assistants and Nurse Practitioners) who all work together to provide you with the care you need, when you need it.  Your next appointment:   3 month(s)  Provider:   Legrand Como "Oda Kilts, PA-C

## 2022-06-29 DIAGNOSIS — M19011 Primary osteoarthritis, right shoulder: Secondary | ICD-10-CM | POA: Diagnosis not present

## 2022-07-01 DIAGNOSIS — C672 Malignant neoplasm of lateral wall of bladder: Secondary | ICD-10-CM | POA: Diagnosis not present

## 2022-07-01 DIAGNOSIS — Z5111 Encounter for antineoplastic chemotherapy: Secondary | ICD-10-CM | POA: Diagnosis not present

## 2022-07-02 ENCOUNTER — Ambulatory Visit: Payer: Self-pay

## 2022-07-03 DIAGNOSIS — H269 Unspecified cataract: Secondary | ICD-10-CM | POA: Diagnosis not present

## 2022-07-03 DIAGNOSIS — H2511 Age-related nuclear cataract, right eye: Secondary | ICD-10-CM | POA: Diagnosis not present

## 2022-07-08 DIAGNOSIS — Z5111 Encounter for antineoplastic chemotherapy: Secondary | ICD-10-CM | POA: Diagnosis not present

## 2022-07-08 DIAGNOSIS — C672 Malignant neoplasm of lateral wall of bladder: Secondary | ICD-10-CM | POA: Diagnosis not present

## 2022-07-14 ENCOUNTER — Telehealth: Payer: Self-pay | Admitting: Family Medicine

## 2022-07-14 NOTE — Telephone Encounter (Signed)
Dr. Leonie Man- are you ok with pt seeing NP as a combined visit?

## 2022-07-14 NOTE — Addendum Note (Signed)
Addended by: Douglass Rivers D on: 07/14/2022 03:49 PM   Modules accepted: Level of Service

## 2022-07-14 NOTE — Telephone Encounter (Signed)
Pt called wanting to know if his two appts that are back to back can be blended into one appt so that he does not have to come back the very next day for an appt. Please advise.

## 2022-07-14 NOTE — Progress Notes (Signed)
Remote pacemaker transmission.   

## 2022-07-14 NOTE — Telephone Encounter (Signed)
[name] [name], looks like pt is scheduled to see Dr. Leonie Man 10/12/21 for f/u on seizures/MCI and then has appt with you on 10/14/22 for OSA f/u from Dr. Brett Fairy appt. Is this something he can combine and just see you on 10/14/22?

## 2022-07-16 NOTE — Telephone Encounter (Signed)
Please call pt back. He can keep appt with Amy and cx appt w/ Dr. Leonie Man and combine the visits that way.

## 2022-07-20 NOTE — Telephone Encounter (Signed)
Called and spoke to pt. Pt agreed and stated he will be here on 6/26 for a combined appt with the NP.

## 2022-07-21 DIAGNOSIS — G4733 Obstructive sleep apnea (adult) (pediatric): Secondary | ICD-10-CM | POA: Diagnosis not present

## 2022-07-24 DIAGNOSIS — G4733 Obstructive sleep apnea (adult) (pediatric): Secondary | ICD-10-CM | POA: Diagnosis not present

## 2022-07-27 ENCOUNTER — Telehealth: Payer: Self-pay | Admitting: Internal Medicine

## 2022-07-27 NOTE — Telephone Encounter (Signed)
   Pre-operative Risk Assessment    Patient Name: [name] [name]  DOB: 08/17/1940 MRN: [id number]      Request for Surgical Clearance    Procedure:   Right Reverse Total Shoulder Replacement   Date of Surgery:  Clearance TBD                                 Surgeon:  Dr. Ave Filter  Surgeon's Group or Practice Name:  [name]  Phone number:  [phone] Fax number:  [phone]   Type of Clearance Requested:   - Medical  - Pharmacy:  Hold    Unsure    Type of Anesthesia:   Choice    Additional requests/questions:  Please advise surgeon/provider what medications should be held.  Signed, April Henson   07/27/2022, 1:23 PM

## 2022-07-27 NOTE — Telephone Encounter (Signed)
     Primary Cardiologist: Dr. Graciela Husbands  Chart reviewed as part of pre-operative protocol coverage. Given past medical history and time since last visit, based on ACC/AHA guidelines, [name] [name] would be at acceptable risk for the planned procedure without further cardiovascular testing.   His RCRI is a class III risk, 6.6% risk of major cardiac event.  He is currently not taking any anticoagulant or antiplatelet medications.  I will route this recommendation to the requesting party via Epic fax function and remove from pre-op pool.  Please call with questions.  Thomasene Ripple. Nolan Tuazon NP-C     07/27/2022, 1:39 PM [facility] [address] Office [phone] Fax [phone]

## 2022-08-03 ENCOUNTER — Ambulatory Visit: Payer: Self-pay

## 2022-08-03 DIAGNOSIS — I495 Sick sinus syndrome: Secondary | ICD-10-CM

## 2022-08-04 LAB — CUP PACEART REMOTE DEVICE CHECK
Battery Remaining Longevity: 1 mo
Battery Remaining Percentage: 0.5 %
Battery Voltage: 2.62 V
Brady Statistic AP VP Percent: 1 %
Brady Statistic AP VS Percent: 1.3 %
Brady Statistic AS VP Percent: 1 %
Brady Statistic AS VS Percent: 99 %
Brady Statistic RA Percent Paced: 1.2 %
Brady Statistic RV Percent Paced: 1 %
Date Time Interrogation Session: 20240415072055
Implantable Lead Connection Status: 753985
Implantable Lead Connection Status: 753985
Implantable Lead Implant Date: 20111010
Implantable Lead Implant Date: 20111010
Implantable Lead Location: 753859
Implantable Lead Location: 753860
Implantable Pulse Generator Implant Date: 20111010
Lead Channel Impedance Value: 280 Ohm
Lead Channel Impedance Value: 300 Ohm
Lead Channel Pacing Threshold Amplitude: 0.75 V
Lead Channel Pacing Threshold Amplitude: 1.25 V
Lead Channel Pacing Threshold Pulse Width: 0.4 ms
Lead Channel Pacing Threshold Pulse Width: 0.4 ms
Lead Channel Sensing Intrinsic Amplitude: 2.2 mV
Lead Channel Sensing Intrinsic Amplitude: 4.6 mV
Lead Channel Setting Pacing Amplitude: 1.5 V
Lead Channel Setting Pacing Amplitude: 1.75 V
Lead Channel Setting Pacing Pulse Width: 0.4 ms
Lead Channel Setting Sensing Sensitivity: 2 mV
Pulse Gen Model: 2110
Pulse Gen Serial Number: 7156802

## 2022-08-23 DIAGNOSIS — G4733 Obstructive sleep apnea (adult) (pediatric): Secondary | ICD-10-CM | POA: Diagnosis not present

## 2022-08-27 ENCOUNTER — Telehealth: Payer: Self-pay | Admitting: Internal Medicine

## 2022-08-27 NOTE — Telephone Encounter (Signed)
Patient is calling wanting to know when his pacemaker will need to be replaced. Please advise.

## 2022-08-28 ENCOUNTER — Telehealth: Payer: Self-pay | Admitting: Family Medicine

## 2022-08-28 NOTE — Telephone Encounter (Signed)
LVM for patient to check  MyChart message that was sent.

## 2022-08-28 NOTE — Telephone Encounter (Signed)
I spoke with the patient and he informed me that he would like to hold off on surgery until he discusses it with PCP and Darel Hong with [name] is aware.

## 2022-08-28 NOTE — Telephone Encounter (Addendum)
[name] [name] with [name]   Calling to FU on request for Surgical Clearance.  Been waiting since July 27, 2022.  Informed MD is OOO until June 2024.  Stated they have called here several times and was reassured another provider would assist with clearance.  I cannot seem to find previous encounters from this office.  Surgery cannot be scheduled until clearance has been received.  **Was this fax ever received?  Request for Surgical Clearance     Procedure:   Right Reverse Total Shoulder Replacement    Date of Surgery:  Clearance TBD                                 Surgeon:  Dr. Ave Filter  Surgeon's Group or Practice Name:  [name]  Phone number:  [phone] Fax number:  [phone]   Type of Clearance Requested:   - Medical  - Pharmacy:  Hold    Unsure    Type of Anesthesia:   Choice    Additional requests/questions:  Please advise surgeon/provider what medications should be held.

## 2022-09-03 ENCOUNTER — Ambulatory Visit (INDEPENDENT_AMBULATORY_CARE_PROVIDER_SITE_OTHER): Payer: Self-pay

## 2022-09-03 DIAGNOSIS — I495 Sick sinus syndrome: Secondary | ICD-10-CM

## 2022-09-07 NOTE — Progress Notes (Signed)
Remote pacemaker transmission.   

## 2022-09-07 NOTE — Addendum Note (Signed)
Addended by: Geralyn Flash D on: 09/07/2022 09:59 AM   Modules accepted: Level of Service

## 2022-09-08 LAB — CUP PACEART REMOTE DEVICE CHECK
Battery Remaining Longevity: 1 mo
Battery Remaining Percentage: 0.5 %
Battery Voltage: 2.6 V
Brady Statistic AP VP Percent: 1 %
Brady Statistic AP VS Percent: 1.4 %
Brady Statistic AS VP Percent: 1 %
Brady Statistic AS VS Percent: 99 %
Brady Statistic RA Percent Paced: 1.3 %
Brady Statistic RV Percent Paced: 1 %
Date Time Interrogation Session: 20240521083359
Implantable Lead Connection Status: 753985
Implantable Lead Connection Status: 753985
Implantable Lead Implant Date: 20111010
Implantable Lead Implant Date: 20111010
Implantable Lead Location: 753859
Implantable Lead Location: 753860
Implantable Pulse Generator Implant Date: 20111010
Lead Channel Impedance Value: 260 Ohm
Lead Channel Impedance Value: 330 Ohm
Lead Channel Pacing Threshold Amplitude: 0.75 V
Lead Channel Pacing Threshold Amplitude: 1.25 V
Lead Channel Pacing Threshold Pulse Width: 0.4 ms
Lead Channel Pacing Threshold Pulse Width: 0.4 ms
Lead Channel Sensing Intrinsic Amplitude: 2.6 mV
Lead Channel Sensing Intrinsic Amplitude: 4.8 mV
Lead Channel Setting Pacing Amplitude: 1.5 V
Lead Channel Setting Pacing Amplitude: 1.75 V
Lead Channel Setting Pacing Pulse Width: 0.4 ms
Lead Channel Setting Sensing Sensitivity: 2 mV
Pulse Gen Model: 2110
Pulse Gen Serial Number: 7156802

## 2022-09-09 DIAGNOSIS — C672 Malignant neoplasm of lateral wall of bladder: Secondary | ICD-10-CM | POA: Diagnosis not present

## 2022-09-11 DIAGNOSIS — G4733 Obstructive sleep apnea (adult) (pediatric): Secondary | ICD-10-CM | POA: Diagnosis not present

## 2022-09-17 NOTE — Addendum Note (Signed)
Addended by: Elease Etienne A on: 09/17/2022 10:16 AM   Modules accepted: Level of Service

## 2022-09-17 NOTE — Progress Notes (Signed)
Remote pacemaker transmission.   

## 2022-09-22 DIAGNOSIS — C44319 Basal cell carcinoma of skin of other parts of face: Secondary | ICD-10-CM | POA: Diagnosis not present

## 2022-09-22 DIAGNOSIS — L821 Other seborrheic keratosis: Secondary | ICD-10-CM | POA: Diagnosis not present

## 2022-09-22 DIAGNOSIS — D692 Other nonthrombocytopenic purpura: Secondary | ICD-10-CM | POA: Diagnosis not present

## 2022-09-22 DIAGNOSIS — L570 Actinic keratosis: Secondary | ICD-10-CM | POA: Diagnosis not present

## 2022-09-22 DIAGNOSIS — Z85828 Personal history of other malignant neoplasm of skin: Secondary | ICD-10-CM | POA: Diagnosis not present

## 2022-09-27 NOTE — Progress Notes (Unsigned)
  Electrophysiology Office Note:   Date:  09/27/2022  ID:  [name] [name], DOB 1940/10/24, MRN [id number]  Primary Cardiologist: None Electrophysiologist: Sherryl Manges, MD   History of Present Illness:   [name] [name] is a 82 y.o. male with h/o SND, s/p St Jude/Abbott DDD PPM 2011, seizures and HTN  seen today for routine electrophysiology followup.   Seen 06/23/24 as reportedly was nearing ERI, but had several months left.   Since last being seen in our clinic the patient reports doing ***.  he denies chest pain, palpitations, dyspnea, PND, orthopnea, nausea, vomiting, dizziness, syncope, edema, weight gain, or early satiety.   Review of systems complete and found to be negative unless listed in HPI.   Device History: Abbott Dual Chamber PPM implanted 2011 for SND   Studies Reviewed:    PPM Interrogation-  reviewed in detail today,  See PACEART report.  {EKGtoday:28818::"EKG is not ordered today"}  {Select studies to display:26339}  Risk Assessment/Calculations:   {Does this patient have ATRIAL FIBRILLATION?[phone]} No BP recorded.  {Refresh Note OR Click here to enter BP  :1}***        Physical Exam:   VS:  There were no vitals taken for this visit.   Wt Readings from Last 3 Encounters:  06/24/22 158 lb (71.7 kg)  05/14/22 155 lb (70.3 kg)  05/07/22 155 lb (70.3 kg)     GEN: Well nourished, well developed in no acute distress NECK: No JVD; No carotid bruits CARDIAC: {EPRHYTHM:28826}, no murmurs, rubs, gallops RESPIRATORY:  Clear to auscultation without rales, wheezing or rhonchi  ABDOMEN: Soft, non-tender, non-distended EXTREMITIES:  No edema; No deformity   ASSESSMENT AND PLAN:    SND s/p Abbott PPM  H/o Syncope (in setting of SND) Normal PPM function See Pace Art report No changes today No syncope s/p pacing.  Explained risks and benefits to PPM generator change, including but not limited to bleeding and infection. Pt verbalized understanding and agrees to  proceed once ERI is met.    HTN Stable on current regimen    H/o Seizure disorder Continue Keppra per neurology.  {Click here to Review PMH, Prob List, Meds, Allergies, SHx, FHx  :1}   Disposition:   Follow up with {EPPROVIDERS:28135} {EPFOLLOW UP:28173}  Signed, [name] Freer, PA-C

## 2022-09-29 ENCOUNTER — Ambulatory Visit: Payer: Medicare Other | Attending: Student | Admitting: Student

## 2022-09-29 ENCOUNTER — Encounter: Payer: Self-pay | Admitting: *Deleted

## 2022-09-29 ENCOUNTER — Encounter: Payer: Self-pay | Admitting: Student

## 2022-09-29 VITALS — BP 138/60 | HR 61 | Ht 67.0 in | Wt 150.4 lb

## 2022-09-29 DIAGNOSIS — Z950 Presence of cardiac pacemaker: Secondary | ICD-10-CM

## 2022-09-29 DIAGNOSIS — I495 Sick sinus syndrome: Secondary | ICD-10-CM | POA: Diagnosis not present

## 2022-09-29 DIAGNOSIS — R55 Syncope and collapse: Secondary | ICD-10-CM

## 2022-09-29 LAB — CUP PACEART INCLINIC DEVICE CHECK
Battery Remaining Longevity: 0 mo
Battery Voltage: 2.59 V
Brady Statistic RA Percent Paced: 1.7 %
Brady Statistic RV Percent Paced: 0.07 %
Date Time Interrogation Session: 20240611093955
Implantable Lead Connection Status: 753985
Implantable Lead Connection Status: 753985
Implantable Lead Implant Date: 20111010
Implantable Lead Implant Date: 20111010
Implantable Lead Location: 753859
Implantable Lead Location: 753860
Implantable Pulse Generator Implant Date: 20111010
Lead Channel Impedance Value: 262.5 Ohm
Lead Channel Impedance Value: 300 Ohm
Lead Channel Pacing Threshold Amplitude: 0.625 V
Lead Channel Pacing Threshold Amplitude: 1.375 V
Lead Channel Pacing Threshold Pulse Width: 0.4 ms
Lead Channel Pacing Threshold Pulse Width: 0.4 ms
Lead Channel Sensing Intrinsic Amplitude: 2.1 mV
Lead Channel Sensing Intrinsic Amplitude: 6.4 mV
Lead Channel Setting Pacing Amplitude: 1.625
Lead Channel Setting Pacing Amplitude: 1.625
Lead Channel Setting Pacing Pulse Width: 0.4 ms
Lead Channel Setting Sensing Sensitivity: 2 mV
Pulse Gen Model: 2110
Pulse Gen Serial Number: 7156802

## 2022-09-29 NOTE — Patient Instructions (Addendum)
Medication Instructions:  Your physician recommends that you continue on your current medications as directed. Please refer to the Current Medication list given to you today.  *If you need a refill on your cardiac medications before your next appointment, please call your pharmacy*   Lab Work: BMET, CBC-11/25/2022-you can come anytime 7:15 AM-4:30 PM you do not need to be fasting If you have labs (blood work) drawn today and your tests are completely normal, you will receive your results only by: MyChart Message (if you have MyChart) OR A paper copy in the mail If you have any lab test that is abnormal or we need to change your treatment, we will call you to review the results.  Testing/Procedures: See instruction letter   Follow-Up: At [facility], you and your health needs are our priority.  As part of our continuing mission to provide you with exceptional heart care, we have created designated Provider Care Teams.  These Care Teams include your primary Cardiologist (physician) and Advanced Practice Providers (APPs -  Physician Assistants and Nurse Practitioners) who all work together to provide you with the care you need, when you need it.  Your next appointment:   Will be scheduled and print out on your discharge summary from the hospital after your procedure

## 2022-10-02 ENCOUNTER — Ambulatory Visit (INDEPENDENT_AMBULATORY_CARE_PROVIDER_SITE_OTHER): Payer: Medicare Other | Admitting: Family Medicine

## 2022-10-02 ENCOUNTER — Encounter: Payer: Self-pay | Admitting: Family Medicine

## 2022-10-02 ENCOUNTER — Ambulatory Visit (INDEPENDENT_AMBULATORY_CARE_PROVIDER_SITE_OTHER): Payer: Medicare Other

## 2022-10-02 VITALS — BP 116/58 | HR 66 | Temp 98.1°F | Ht 67.0 in | Wt 153.7 lb

## 2022-10-02 DIAGNOSIS — E785 Hyperlipidemia, unspecified: Secondary | ICD-10-CM

## 2022-10-02 DIAGNOSIS — I495 Sick sinus syndrome: Secondary | ICD-10-CM | POA: Diagnosis not present

## 2022-10-02 DIAGNOSIS — I10 Essential (primary) hypertension: Secondary | ICD-10-CM

## 2022-10-02 DIAGNOSIS — M19011 Primary osteoarthritis, right shoulder: Secondary | ICD-10-CM

## 2022-10-02 LAB — CUP PACEART REMOTE DEVICE CHECK
Battery Remaining Longevity: 1 mo
Battery Remaining Percentage: 0.5 %
Battery Voltage: 2.59 V
Brady Statistic AP VP Percent: 1 %
Brady Statistic AP VS Percent: 5.7 %
Brady Statistic AS VP Percent: 1 %
Brady Statistic AS VS Percent: 94 %
Brady Statistic RA Percent Paced: 5.3 %
Brady Statistic RV Percent Paced: 1 %
Date Time Interrogation Session: 20240614150346
Implantable Lead Connection Status: 753985
Implantable Lead Connection Status: 753985
Implantable Lead Implant Date: 20111010
Implantable Lead Implant Date: 20111010
Implantable Lead Location: 753859
Implantable Lead Location: 753860
Implantable Pulse Generator Implant Date: 20111010
Lead Channel Impedance Value: 260 Ohm
Lead Channel Impedance Value: 300 Ohm
Lead Channel Pacing Threshold Amplitude: 0.625 V
Lead Channel Pacing Threshold Amplitude: 1.25 V
Lead Channel Pacing Threshold Pulse Width: 0.4 ms
Lead Channel Pacing Threshold Pulse Width: 0.4 ms
Lead Channel Sensing Intrinsic Amplitude: 2.3 mV
Lead Channel Sensing Intrinsic Amplitude: 4.2 mV
Lead Channel Setting Pacing Amplitude: 1.5 V
Lead Channel Setting Pacing Amplitude: 1.625
Lead Channel Setting Pacing Pulse Width: 0.4 ms
Lead Channel Setting Sensing Sensitivity: 2 mV
Pulse Gen Model: 2110
Pulse Gen Serial Number: 7156802

## 2022-10-02 NOTE — Progress Notes (Signed)
Established Patient Office Visit  Subjective   Patient ID: [name] [name], male    DOB: Aug 26, 1940  Age: 82 y.o. MRN: [id number]  Chief Complaint  Patient presents with   Shoulder Pain    On R side. Pt would like to discuss on reverse shoulder replacement. Not sure if needed. Use Salonpas patch. Helps.     HPI   [name] [name] is here to discuss possible surgery for right reversal total shoulder replacement.  He has seen orthopedist and had CT of the shoulder back in February which showed severe osteoarthritis of the right glenohumeral joint.  He saw orthopedist who suggested possible surgery as above.  He states he really does not have any significant pain in the shoulder.  He has been limited and unable to play golf but still plays pickle ball and other than giving up golf not limited in day-to-day activities.  He is not sure he wishes to pursue surgery at this time given his age.  He has bladder cancer and had surgery several months ago and is doing well from that.  He has hypertension history and hyperlipidemia is followed through the [place] and had labs through them.  He is getting his medications including rosuvastatin and benazepril through the [place]  History of seizures stable on Keppra.  Followed by neurology.  No recent seizure.  He has history of sinus node dysfunction and will get pacemaker battery replacement soon.  Past Medical History:  Diagnosis Date   Bladder tumor    Cancer [facility])    Cardiac pacemaker in situ 01/27/2010   by [name] [name] for documented SND,  St Jude dual chamber   GERD (gastroesophageal reflux disease)    History of adenomatous polyp of colon    History of basal cell carcinoma (BCC) excision    forehead and nose area 2015, 2016   History of squamous cell carcinoma excision    excision bilateral temple area  2015, 2016, 2019   History of syncope 2005   2005  w/ recurrent ;  documented sinus node dysfunction,   s/p PPM 2011  (03-20-2022  pt stated many yrs since  last syncope)   History of transient ischemic attack (TIA)    Hyperlipidemia    Hypertension    Mild cognitive impairment    Nocturia    OA (osteoarthritis)    OSA on CPAP    followed by [name] [name] [name];   study in epic 06-23-2021 severe complex apnea syndrome,   pt stated usese cpap nightly   Presence of permanent cardiac pacemaker    RLS (restless legs syndrome)    Rotator cuff tear arthropathy, right    Seizure disorder [facility]) 2021   neurologist--- [name] [name] [name];   started 2021 nocturnal seizures;   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, not intractable , without status epilepticus  (03-20-2022  pt stated last seizure 01/ 2023)   Sinus node dysfunction [facility]) 2010   cardiologist--- [name] [name];  hx recurrent syncope;   04/ 2010  loop recorder implant w/ documented SND;   01-27-2010  s/p PPM dual chamber , St Jude   TIA (transient ischemic attack)    Wears glasses    Wears hearing aid in both ears    Past Surgical History:  Procedure Laterality Date   CARDIAC PACEMAKER PLACEMENT  01/27/2010   @[facility]  by [name] [name];   explant loop recorder /  PPM  St Jude dual chamber   COLONOSCOPY WITH PROPOFOL  08/07/2020   [name] [name]   ESOPHAGOGASTRODUODENOSCOPY (EGD) WITH PROPOFOL  02/05/2014   [name] [name]   LOOP RECORDER IMPLANT  08/17/2008   @[facility]   by [name] [name];   after tilt table study normal;  for recurrent syncope   TRANSURETHRAL RESECTION OF BLADDER TUMOR WITH MITOMYCIN-C N/A 03/23/2022   Procedure: TRANSURETHRAL RESECTION OF BLADDER TUMOR WITH POST OPERATIVE GEMCITABINE;  Surgeon: [name] Matar, MD;  Location: [facility];  Service: Urology;  Laterality: N/A;  45 MINS   TRANSURETHRAL RESECTION OF BLADDER TUMOR WITH MITOMYCIN-C N/A 05/14/2022   Procedure: TRANSURETHRAL RESECTION OF BLADDER TUMOR WITH GEMCITABINE;  Surgeon: [name] Matar, MD;  Location: [facility];  Service: Urology;  Laterality: N/A;  30 MINS    reports that he quit  smoking about 42 years ago. His smoking use included cigarettes. He has never used smokeless tobacco. He reports current alcohol use of about 14.0 standard drinks of alcohol per week. He reports current drug use. Frequency: 7.00 times per week. Drug: Marijuana. family history includes Alcohol abuse in his maternal aunt; Colitis in his father; Hypertension in his father and mother; Liver disease in his father; Other in his father; Stroke in his mother. No Known Allergies  Review of Systems  Constitutional:  Negative for malaise/fatigue.  Eyes:  Negative for blurred vision.  Respiratory:  Negative for shortness of breath.   Cardiovascular:  Negative for chest pain.  Neurological:  Negative for dizziness, seizures, weakness and headaches.      Objective:     BP (!) 116/58 (BP Location: Left Arm, Patient Position: Sitting, Cuff Size: Normal)   Pulse 66   Temp 98.1 F (36.7 C) (Oral)   Ht 5\' 7"  (1.702 m)   Wt 153 lb 11.2 oz (69.7 kg)   SpO2 97%   BMI 24.07 kg/m  BP Readings from Last 3 Encounters:  10/02/22 (!) 116/58  09/29/22 138/60  06/24/22 118/70   Wt Readings from Last 3 Encounters:  10/02/22 153 lb 11.2 oz (69.7 kg)  09/29/22 150 lb 6.4 oz (68.2 kg)  06/24/22 158 lb (71.7 kg)      Physical Exam Vitals reviewed.  Constitutional:      Appearance: Normal appearance.  Cardiovascular:     Rate and Rhythm: Normal rate and regular rhythm.  Pulmonary:     Effort: Pulmonary effort is normal.     Breath sounds: Normal breath sounds. No wheezing or rales.  Musculoskeletal:     Right lower leg: No edema.     Left lower leg: No edema.  Neurological:     Mental Status: He is alert.      No results found for any visits on 10/02/22.    The 10-year ASCVD risk score (Arnett DK, et al., 2019) is: 26.7%* (Cholesterol units were assumed)    Assessment & Plan:   #1 severe degenerative arthritis right glenohumeral joint.  Patient is here basically to discuss pros and cons  of surgery.  He is not having significant day-to-day pain.  Even though his severe arthritis has limited his golf he is able to pursue other things he enjoys such as pickleball and other day-to-day activities and still has reasonably good range of motion.  He is not sure he wishes to pursue surgery at this point because of inherent surgical risk especially at his age.  He realizes this is ultimately his decision  #2 hypertension-controlled with benazepril.  He states he had recent lab work through the [place] earlier this spring and he  will try to get a copy of [place]  #3 hyperlipidemia treated with rosuvastatin.  Reportedly had lipids through the [place].  No follow-ups on file.    Evelena Peat, MD

## 2022-10-12 DIAGNOSIS — G4733 Obstructive sleep apnea (adult) (pediatric): Secondary | ICD-10-CM | POA: Diagnosis not present

## 2022-10-13 ENCOUNTER — Ambulatory Visit: Payer: Medicare Other | Admitting: Neurology

## 2022-10-14 ENCOUNTER — Ambulatory Visit: Payer: Medicare Other | Admitting: Family Medicine

## 2022-10-14 ENCOUNTER — Encounter: Payer: Self-pay | Admitting: Family Medicine

## 2022-10-14 ENCOUNTER — Ambulatory Visit: Payer: Medicare Other | Admitting: Adult Health

## 2022-10-14 VITALS — BP 133/47 | HR 70 | Ht 67.0 in | Wt 159.4 lb

## 2022-10-14 DIAGNOSIS — G40909 Epilepsy, unspecified, not intractable, without status epilepticus: Secondary | ICD-10-CM | POA: Diagnosis not present

## 2022-10-14 DIAGNOSIS — G459 Transient cerebral ischemic attack, unspecified: Secondary | ICD-10-CM | POA: Diagnosis not present

## 2022-10-14 DIAGNOSIS — G3184 Mild cognitive impairment, so stated: Secondary | ICD-10-CM

## 2022-10-14 DIAGNOSIS — G4731 Primary central sleep apnea: Secondary | ICD-10-CM | POA: Diagnosis not present

## 2022-10-14 MED ORDER — TRAZODONE HCL 100 MG PO TABS: 100.0000 mg | ORAL_TABLET | Freq: Every evening | ORAL | 3 refills | Status: DC | PRN

## 2022-10-14 NOTE — Progress Notes (Signed)
PATIENT: [name] [name] DOB: October 25, 1940  REASON FOR VISIT: follow up HISTORY FROM: patient  Chief Complaint  Patient presents with   Follow-up    Pt in room 1. Here for seizures/cpap follow up. Pt has not had any recent seizures. Pt said he tries to get 4 hours each day. MOCA:24    Sethi: seizures and MCI Dohmeier: OSA   HISTORY OF PRESENT ILLNESS:  10/14/22 ALL:  [name] [name] is a 82 y.o. male here today for follow up for complex on CPAP, insomnia, seizures and MCI. He was last seen by Dr Pearlean Brownie 03/2022. Seizures were stable on lev 500/1000 and memory felt to be stable.   Since, he reports doing well. He continues lev 500mg  in and 1000mg  in pm. No seizure activity. Last seizure 04/2020. He denies missed doses.   Memory is stable. He continues to drive without difficulty. He helps maintain home. Mrs Hasler manages finances and always has. He is able to run to grocery store. Performs ADLs independently. He continues an active lifestyle with pickle ball and walking. He liks to do crossword puzzles and play sudoku. He is sleeping well. He continues trazodone 100mg  at bedtime. He is not sure he needs to continue. He seems to tolerate it well.   He continues regular follow up with PCP. BP is well managed. He continues asa and rosuvastatin. No recent TIA symptoms.     History (copied from Dr Marlis Edelson previous note)  He returns for follow-up after last visit 6 months ago.  He states he has had no further seizures.  He remains on Keppra 500 mg in the morning and 1 gm at night and is tolerating it well without any side effects.  He continues to have mild short-term memory difficulties which she feels are unchanged and stable and not necessarily getting worse.  He has not been participating in any regular activities for stress relaxation.  He denies any new complaints.  He had follow-up appointment with Dr. Vickey Huger on 09/23/2021.  He has no new complaints today.   HISTORY: (copied from Dr  Dohmeier's previous note)  [name] [name] is a 44 y.o. right- handed  Caucasian male patient who seen here  in a sleep consultation   on 10/13/2021 from Dr Pearlean Brownie, who he follows for nocturnal seizures, he once had 4 in one night when he was vacationing in [place]- was hospitalized there. Beginning in 10/ 2021 and had one last seizure January 2023. Dr Pearlean Brownie had assumed that the initial event in [facility] in 2021 was a possible TIA related seizure. [name] [name] has a past medical history of Allergy, Arthritis, GERD (gastroesophageal reflux disease), HYPERLIPIDEMIA (07/17/2008), HYPERTENSION (07/17/2008), Nocturnal Seizures (HCC), Sinus node dysfunction (HCC) (01/27/2011), and Sleep apnea. 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  He hasn't had any further seizures since march and no longer needs his Benedetto Goad driver- he only has Night time seizures.    10-13-2021: I have the pleasure of seeing [name] [name] today he is a patient of Dr. Promotes that he is to whom he had presented with nocturnal seizures recently.  The patient had a diagnostic polysomnography on 06-23-2021 which revealed a very high apnea hypopnea index at 36.9/h and he had 10 minutes total with lower oxygen saturation.  The nadir was 84% this is not concerning by apnea standards he was a very frequent mover at night his periodic limb movement arousal index was 3.4 so he barely slept for half  the recording time and even each time he went to sleep he had still significant apnea and periodic limb movements.  He underwent an in-house titration this was performed on 15 March with the help of registered PSG technician Domingo Cocking.  [name] [name] increased his CPAP pressure to 7 cm setting which is a mild setting by all accounts.   There was a reduction of his apnea to 0.9/h so significant improvement but he still was frequently moving.  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  This has been helped by CPAP.   The patient has a clinical history of restless leg syndrome but he endorsed the Epworth Sleepiness Scale at 0 out of 24 points today his compliance report is excellent 100% of use 7 hours 41 minutes daily over 30 out of 30 days his AutoSet is offering a pressure between 6 and 10 cm water pressure with 1 cm EPR and his residual AHI was 2.0.  The 95th percentile pressure was 9.4 cm water.  There are some obstructive and some central apneas remaining but there was no Cheyne-Stokes respiration.  I would like to add that he did not have abnormal EEG findings during his attended sleep study.  Sleep relevant medical history: Nocturia 3-5 times- Family medical /sleep history:  no hx; cousin with OSA, insomnia, sleep walkers.  Social history: retired  from [employer], camping and [employer]-   Patient is working as and lives in a household with spouse, one dog. Adult children. Tobacco use quit 39 years ago- ETOH use 2-3 beers a day-  Smoking pot,  Caffeine intake in form of Coffee( 3 cups a day). Regular exercise in form of  walking, pickle ball. .     Sleep habits are as follows: The patient's dinner time is between 5-7 PM. The patient goes to bed at 10-11 PM and continues to sleep for intervals of 2-3 hours , wakes for many,many  bathroom breaks,. He goes easily back to sleep, bedroom is cool, quiet and dark.  The preferred sleep position is sideways, with the support of 1 pillow, bed is adjustable but kept flat. . Dreams are reportedly rare.  7.30  AM is the usual rise time. The patient wakes up spontaneously. He reports  feeling refreshed / restored in AM, but stiffness.. Naps are taken infrequently.   REVIEW OF SYSTEMS: Out of a complete 14 system review of symptoms, the patient complains only of the following symptoms, and all other reviewed systems are negative.  ESS:  ALLERGIES: No Known Allergies  HOME MEDICATIONS: Outpatient  Medications Prior to Visit  Medication Sig Dispense Refill   acetaminophen (TYLENOL) 650 MG CR tablet Take 650 mg by mouth every 8 (eight) hours as needed for pain.     aspirin EC 81 MG tablet Take 81 mg by mouth daily.     benazepril (LOTENSIN) 40 MG tablet TAKE 1 TABLET BY MOUTH EVERY DAY 90 tablet 2   calcium carbonate (OSCAL) 1500 (600 Ca) MG TABS tablet Take 600 mg of elemental calcium by mouth 2 (two) times daily with a meal.     Camphor-Menthol-Methyl Sal (SALONPAS) 3.04-25-08 % PTCH Place 1 patch onto the skin daily as needed (pain).     cholecalciferol (VITAMIN D3) 25 MCG (1000 UNIT) tablet Take 1,000 Units by mouth daily.     Coenzyme Q10 (COQ10 PO) Take 1,000 mg by mouth  daily.     Glycerin-Polysorbate 80 (REFRESH DRY EYE THERAPY OP) Place 1 drop into both eyes as needed (dry eyes).     levETIRAcetam (KEPPRA) 500 MG tablet Take one tablet in am and two tablets in pm. 270 tablet 3   NON FORMULARY Pt uses a cpap nightly     rosuvastatin (CRESTOR) 5 MG tablet TAKE 1 TABLET BY MOUTH EVERY DAY 90 tablet 3   Turmeric (QC TUMERIC COMPLEX PO) Take 1 capsule by mouth daily.     traZODone (DESYREL) 100 MG tablet TAKE 1 TABLET BY MOUTH AT BEDTIME AS NEEDED FOR SLEEP. 90 tablet 1   Ascorbic Acid (VITAMIN C PO) Take 1,000 mg by mouth daily. (Patient not taking: Reported on 09/29/2022)     Omega-3 Fatty Acids (FISH OIL) 1000 MG CPDR Take 1 capsule by mouth every morning. (Patient not taking: Reported on 10/02/2022) 30 capsule 1   No facility-administered medications prior to visit.    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/27/2010   by dr Graciela Husbands for documented SND,  St Jude dual chamber   GERD (gastroesophageal reflux disease)    History of adenomatous polyp of colon    History of basal cell carcinoma (BCC) excision    forehead and nose area 2015, 2016   History of squamous cell carcinoma excision     excision bilateral temple area  2015, 2016, 2019   History of syncope 2005   2005  w/ recurrent ;  documented sinus node dysfunction,   s/p PPM 2011  (03-20-2022  pt stated many yrs since last syncope)   History of transient ischemic attack (TIA)    Hyperlipidemia    Hypertension    Mild cognitive impairment    Nocturia    OA (osteoarthritis)    OSA on CPAP    followed by dr Vickey Huger;   study in epic 06-23-2021 severe complex apnea syndrome,   pt stated usese cpap nightly   Presence of permanent cardiac pacemaker    RLS (restless legs syndrome)    Rotator cuff tear arthropathy, right    Seizure disorder [facility]) 2021   neurologist--- dr Pearlean Brownie;   started 2021 nocturnal seizures;   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, not intractable , without status epilepticus  (03-20-2022  pt stated last seizure 01/ 2023)   Sinus node dysfunction [facility]) 2010   cardiologist--- dr Graciela Husbands;  hx recurrent syncope;   04/ 2010  loop recorder implant w/ documented SND;   01-27-2010  s/p PPM dual chamber , St Jude   TIA (transient ischemic attack)    Wears glasses    Wears hearing aid in both ears     PAST SURGICAL HISTORY: Past Surgical History:  Procedure Laterality Date   CARDIAC PACEMAKER PLACEMENT  01/27/2010   @[facility]  by dr Graciela Husbands;   explant loop recorder /  PPM  St Jude dual chamber   COLONOSCOPY WITH PROPOFOL  08/07/2020   dr pyrle   ESOPHAGOGASTRODUODENOSCOPY (EGD) WITH PROPOFOL  02/05/2014   dr pyrtle   LOOP RECORDER IMPLANT  08/17/2008   @[facility]   by dr Graciela Husbands;   after tilt table study normal;  for recurrent syncope   TRANSURETHRAL RESECTION OF BLADDER TUMOR WITH MITOMYCIN-C N/A 03/23/2022   Procedure: TRANSURETHRAL RESECTION OF BLADDER TUMOR WITH POST OPERATIVE GEMCITABINE;  Surgeon: Marcine Matar, MD;  Location: [facility];  Service: Urology;  Laterality: N/A;  45 MINS   TRANSURETHRAL RESECTION OF BLADDER TUMOR WITH MITOMYCIN-C  N/A 05/14/2022   Procedure: TRANSURETHRAL RESECTION OF BLADDER TUMOR WITH GEMCITABINE;  Surgeon: Marcine Matar, MD;  Location: [facility];  Service: Urology;  Laterality: N/A;  30 MINS    FAMILY HISTORY: Family History  Problem Relation Age of Onset   Hypertension Mother    Stroke Mother    Colitis Father    Hypertension Father    Other Father        esophageal stricture   Liver disease Father    Alcohol abuse Maternal Aunt    Rectal cancer Neg Hx    Stomach cancer Neg Hx    Esophageal cancer Neg Hx    Colon polyps Neg Hx    Colon cancer Neg Hx     SOCIAL HISTORY: Social History   Socioeconomic History   Marital status: Married    Spouse name: Peggy   Number of children: 1   Years of education: Not on file   Highest education level: Bachelor's degree (e.g., BA, AB, BS)  Occupational History   Occupation: CO-OWNER    Employer: [employer]  Tobacco Use   Smoking status: Former    Years: 25    Types: Cigarettes    Quit date: 1982    Years since quitting: 42.5   Smokeless tobacco: Never  Vaping Use   Vaping Use: Never used  Substance and Sexual Activity   Alcohol use: Yes    Alcohol/week: 14.0 standard drinks of alcohol    Types: 14 Cans of beer per week    Comment: 03-20-2022 2 beers daily , 12 oz each   Drug use: Yes    Frequency: 7.0 times per week    Types: Marijuana    Comment: 03/22/2022   Sexual activity: Not on file  Other Topics Concern   Not on file  Social History Narrative   Lives with spouse   Right Handed   3-4 c caffeine    Social Determinants of Health   Financial Resource Strain: Low Risk  (05/23/2019)   Overall Financial Resource Strain (CARDIA)    Difficulty of Paying Living Expenses: Not hard at all  Food Insecurity: No Food Insecurity (05/23/2019)   Hunger Vital Sign    Worried About Running Out of Food in the Last Year: Never true    Ran Out of Food in the Last Year: Never true  Transportation Needs: No Transportation Needs  (05/23/2019)   PRAPARE - Administrator, Civil Service (Medical): No    Lack of Transportation (Non-Medical): No  Physical Activity: Sufficiently Active (05/23/2019)   Exercise Vital Sign    Days of Exercise per Week: 7 days    Minutes of Exercise per Session: 90 min  Stress: No Stress Concern Present (05/23/2019)   [employer] of Occupational Health - Occupational Stress Questionnaire    Feeling of Stress : Only a little  Social Connections: Unknown (05/23/2019)   Social Connection and Isolation Panel [NHANES]    Frequency of Communication with Friends and Family: More than three times a week    Frequency of Social Gatherings with Friends and Family: Once a week    Attends Religious Services: Not on file    Active Member of Clubs or Organizations: Not on file    Attends Banker Meetings: Not on file    Marital Status: Married  Intimate Partner Violence: Not At Risk (04/18/2021)  Humiliation, Afraid, Rape, and Kick questionnaire    Fear of Current or Ex-Partner: No    Emotionally Abused: No    Physically Abused: No    Sexually Abused: No     PHYSICAL EXAM  Vitals:   10/14/22 1439  BP: (!) 133/47  Pulse: 70  Weight: 159 lb 6.4 oz (72.3 kg)  Height: 5\' 7"  (1.702 m)   Body mass index is 24.97 kg/m.  Generalized: Well developed, in no acute distress  Cardiology: normal rate and rhythm, no murmur noted Respiratory: clear to auscultation bilaterally  Neurological examination  Mentation: Alert oriented to time, place, history taking. Follows all commands speech and language fluent Cranial nerve II-XII: Pupils were equal round reactive to light. Extraocular movements were full, visual field were full  Motor: The motor testing reveals 5 over 5 strength of all 4 extremities. Good symmetric motor tone is noted throughout.  Gait and station: Gait is normal.    DIAGNOSTIC DATA (LABS, IMAGING, TESTING) - I reviewed patient records, labs, notes, testing and  imaging myself where available.      No data to display           Lab Results  Component Value Date   WBC 7.3 05/07/2022   HGB 13.9 05/07/2022   HCT 42.1 05/07/2022   MCV 95.9 05/07/2022   PLT 168 05/07/2022      Component Value Date/Time   NA 132 (L) 05/07/2022 0943   NA 136 (A) 12/05/2015 0000   K 4.3 05/07/2022 0943   CL 100 05/07/2022 0943   CO2 24 05/07/2022 0943   GLUCOSE 97 05/07/2022 0943   BUN 15 05/07/2022 0943   BUN 21 12/05/2015 0000   CREATININE 0.77 05/07/2022 0943   CREATININE 0.84 04/16/2015 0742   CALCIUM 8.8 (L) 05/07/2022 0943   PROT 6.5 07/13/2019 0934   ALBUMIN 4.2 07/13/2019 0934   AST 23 07/13/2019 0934   ALT 20 07/13/2019 0934   ALKPHOS 52 07/13/2019 0934   BILITOT 0.6 07/13/2019 0934   GFRNONAA >60 05/07/2022 0943   GFRAA >60 12/11/2019 0454   Lab Results  Component Value Date   CHOL 171 09/10/2017   HDL 64 09/10/2017   LDLCALC 94 09/10/2017   LDLDIRECT 146.0 07/01/2012   TRIG 63 09/10/2017   CHOLHDL 4 12/22/2016   No results found for: "HGBA1C" No results found for: "VITAMINB12" No results found for: "TSH"      No data to display             10/14/2022    3:05 PM  Montreal Cognitive Assessment   Visuospatial/ Executive (0/5) 4  Naming (0/3) 3  Attention: Read list of digits (0/2) 2  Attention: Read list of letters (0/1) 1  Attention: Serial 7 subtraction starting at 100 (0/3) 3  Language: Repeat phrase (0/2) 2  Language : Fluency (0/1) 0  Abstraction (0/2) 2  Delayed Recall (0/5) 1  Orientation (0/6) 6  Total 24     ASSESSMENT AND PLAN 82 y.o. year old male  has a past medical history of Bladder tumor, Cancer (HCC), Cardiac pacemaker in situ (01/27/2010), GERD (gastroesophageal reflux disease), History of adenomatous polyp of colon, History of basal cell carcinoma (BCC) excision, History of squamous cell carcinoma excision, History of syncope (2005), History of transient ischemic attack (TIA), Hyperlipidemia,  Hypertension, Mild cognitive impairment, Nocturia, OA (osteoarthritis), OSA on CPAP, Presence of permanent cardiac pacemaker, RLS (restless legs syndrome), Rotator cuff tear arthropathy, right, Seizure disorder (HCC) (2021), Sinus  node dysfunction (HCC) (2010), TIA (transient ischemic attack), Wears glasses, and Wears hearing aid in both ears. here with     ICD-10-CM   1. Seizure disorder (HCC)  G40.909     2. MCI (mild cognitive impairment)  G31.84     3. 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 TIA (transient ischemic attack)  G45.9        [name] [name] is doing well on CPAP therapy. Compliance report reveals excellent daily but sub optimal four hour compliance. He was encouraged to continue using CPAP nightly and for greater than 4 hours each night. We will update supply orders as indicated. Risks of untreated sleep apnea review and education materials provided. He will continue trazodone 100mg  at bedtime for insomnia for now. He expresses some interest in weaning this med. I have discussed weaning schedule and provided written instructions is he wishes to wean. He will continue levetiracetam 500mg  in am and 1000mg  in te evenings. He request we not send refills at this time. He will call when needed. MOCA 24/30. Will continue to monitor. Formal neurocognitive referral declined at this time. Memory compensation strategies reviewed. Healthy lifestyle habits encouraged. He will follow up in 1 year, sooner if needed. He verbalizes understanding and agreement with this plan.    No orders of the defined types were placed in this encounter.    Meds ordered this encounter  Medications   traZODone (DESYREL) 100 MG tablet    Sig: Take 1 tablet (100 mg total) by mouth at bedtime as needed. for sleep    Dispense:  90 tablet    Refill:  3    Order Specific Question:   Supervising Provider    Answer:   Bernestine Amass, FNP-C 10/14/2022, 3:33 PM [facility] [address], [address] [place], [place] [address] [phone]

## 2022-10-14 NOTE — Patient Instructions (Addendum)
Below is our plan:  We will continue trazodone 100mg  at bedtime. Please do not stop this medication abruptly. If you wish to wean med, you can take 1/2 tablet daily for 3-4 weeks. If doing well, decrease dose to 1/2 tablet every other day for 2 weeks then you can stop.   Continue levetiracetam 500mg  in am and 1000mg  at bedtime for seizure control.   Continue aspirin and rosuvastatin for stroke prevention per PCP.   Please continue using your CPAP regularly. While your insurance requires that you use CPAP at least 4 hours each night on 70% of the nights, I recommend, that you not skip any nights and use it throughout the night if you can. Getting used to CPAP and staying with the treatment long term does take time and patience and discipline. Untreated obstructive sleep apnea when it is moderate to severe can have an adverse impact on cardiovascular health and raise her risk for heart disease, arrhythmias, hypertension, congestive heart failure, stroke and diabetes. Untreated obstructive sleep apnea causes sleep disruption, nonrestorative sleep, and sleep deprivation. This can have an impact on your day to day functioning and cause daytime sleepiness and impairment of cognitive function, memory loss, mood disturbance, and problems focussing. Using CPAP regularly can improve these symptoms.  We will update supply orders, today.   Please make sure you are staying well hydrated. I recommend 50-60 ounces daily. Well balanced diet and regular exercise encouraged. Consistent sleep schedule with 6-8 hours recommended.   Please continue follow up with care team as directed.   Follow up with me in 1 year   You may receive a survey regarding today's visit. I encourage you to leave honest feed back as I do use this information to improve patient care. Thank you for seeing me today!   Management of Memory Problems   There are some general things you can do to help manage your memory problems.  Your memory  may not in fact recover, but by using techniques and strategies you will be able to manage your memory difficulties better.   1)  Establish a routine. Try to establish and then stick to a regular routine.  By doing this, you will get used to what to expect and you will reduce the need to rely on your memory.  Also, try to do things at the same time of day, such as taking your medication or checking your calendar first thing in the morning. Think about think that you can do as a part of a regular routine and make a list.  Then enter them into a daily planner to remind you.  This will help you establish a routine.   2)  Organize your environment. Organize your environment so that it is uncluttered.  Decrease visual stimulation.  Place everyday items such as keys or cell phone in the same place every day (ie.  Basket next to front door) Use post it notes with a brief message to yourself (ie. Turn off light, lock the door) Use labels to indicate where things go (ie. Which cupboards are for food, dishes, etc.) Keep a notepad and pen by the telephone to take messages   3)  Memory Aids A diary or journal/notebook/daily planner Making a list (shopping list, chore list, to do list that needs to be done) Using an alarm as a reminder (kitchen timer or cell phone alarm) Using cell phone to store information (Notes, Calendar, Reminders) Calendar/White board placed in a prominent position Post-it notes  In order for memory aids to be useful, you need to have good habits.  It's no good remembering to make a note in your journal if you don't remember to look in it.  Try setting aside a certain time of day to look in journal.   4)  Improving mood and managing fatigue. There may be other factors that contribute to memory difficulties.  Factors, such as anxiety, depression and tiredness can affect memory. Regular gentle exercise can help improve your mood and give you more energy. Exercise: there are short  videos created by the [employer] on Health specially for older adults: https://bit.ly/2I30q97.  Mediterranean diet: which emphasizes fruits, vegetables, whole grains, legumes, fish, and other seafood; unsaturated fats such as olive oils; and low amounts of red meat, eggs, and sweets. A variation of this, called MIND (Mediterranean-DASH Intervention for Neurodegenerative Delay) incorporates the DASH (Dietary Approaches to Stop Hypertension) diet, which has been shown to lower high blood pressure, a risk factor for Alzheimer's disease. More information at: ExitMarketing.de.  Aerobic exercise that improve heart health is also good for the mind.  [employer] on Aging have short videos for exercises that you can do at home: BlindWorkshop.com.pt Simple relaxation techniques may help relieve symptoms of anxiety Try to get back to completing activities or hobbies you enjoyed doing in the past. Learn to pace yourself through activities to decrease fatigue. Find out about some local support groups where you can share experiences with others. Try and achieve 7-8 hours of sleep at night.

## 2022-10-20 NOTE — Progress Notes (Signed)
Remote pacemaker transmission.   

## 2022-11-04 ENCOUNTER — Ambulatory Visit (INDEPENDENT_AMBULATORY_CARE_PROVIDER_SITE_OTHER): Payer: Medicare Other

## 2022-11-04 DIAGNOSIS — I495 Sick sinus syndrome: Secondary | ICD-10-CM

## 2022-11-11 DIAGNOSIS — G4733 Obstructive sleep apnea (adult) (pediatric): Secondary | ICD-10-CM | POA: Diagnosis not present

## 2022-11-12 ENCOUNTER — Ambulatory Visit (INDEPENDENT_AMBULATORY_CARE_PROVIDER_SITE_OTHER): Payer: Medicare Other

## 2022-11-12 DIAGNOSIS — I495 Sick sinus syndrome: Secondary | ICD-10-CM

## 2022-11-19 NOTE — Progress Notes (Signed)
Remote pacemaker transmission.   

## 2022-11-24 NOTE — Progress Notes (Signed)
Remote pacemaker transmission.   

## 2022-11-24 NOTE — Addendum Note (Signed)
Addended by: Elease Etienne A on: 11/24/2022 09:05 AM   Modules accepted: Level of Service

## 2022-11-25 ENCOUNTER — Ambulatory Visit: Payer: Medicare Other | Attending: Internal Medicine

## 2022-11-25 DIAGNOSIS — Z950 Presence of cardiac pacemaker: Secondary | ICD-10-CM

## 2022-11-25 DIAGNOSIS — I495 Sick sinus syndrome: Secondary | ICD-10-CM

## 2022-11-25 DIAGNOSIS — R55 Syncope and collapse: Secondary | ICD-10-CM | POA: Diagnosis not present

## 2022-12-02 ENCOUNTER — Encounter (HOSPITAL_COMMUNITY)
Admission: RE | Disposition: A | Discharge status: Home / Self Care | Payer: Self-pay | Source: Ambulatory Visit | Attending: Internal Medicine

## 2022-12-02 ENCOUNTER — Ambulatory Visit (HOSPITAL_COMMUNITY)
Admission: RE | Admit: 2022-12-02 | Discharge: 2022-12-02 | Disposition: A | Discharge status: Home / Self Care | Payer: Medicare Other | Source: Ambulatory Visit | Attending: Internal Medicine | Admitting: Internal Medicine

## 2022-12-02 ENCOUNTER — Other Ambulatory Visit: Payer: Self-pay

## 2022-12-02 DIAGNOSIS — Z8673 Personal history of transient ischemic attack (TIA), and cerebral infarction without residual deficits: Secondary | ICD-10-CM | POA: Insufficient documentation

## 2022-12-02 DIAGNOSIS — Z823 Family history of stroke: Secondary | ICD-10-CM | POA: Diagnosis not present

## 2022-12-02 DIAGNOSIS — I495 Sick sinus syndrome: Secondary | ICD-10-CM | POA: Diagnosis not present

## 2022-12-02 DIAGNOSIS — Z87891 Personal history of nicotine dependence: Secondary | ICD-10-CM | POA: Insufficient documentation

## 2022-12-02 DIAGNOSIS — I10 Essential (primary) hypertension: Secondary | ICD-10-CM | POA: Insufficient documentation

## 2022-12-02 DIAGNOSIS — Z8249 Family history of ischemic heart disease and other diseases of the circulatory system: Secondary | ICD-10-CM | POA: Insufficient documentation

## 2022-12-02 DIAGNOSIS — Z45010 Encounter for checking and testing of cardiac pacemaker pulse generator [battery]: Secondary | ICD-10-CM

## 2022-12-02 DIAGNOSIS — R55 Syncope and collapse: Secondary | ICD-10-CM | POA: Diagnosis not present

## 2022-12-02 DIAGNOSIS — G40909 Epilepsy, unspecified, not intractable, without status epilepticus: Secondary | ICD-10-CM | POA: Diagnosis not present

## 2022-12-02 HISTORY — PX: PPM GENERATOR CHANGEOUT: EP1233

## 2022-12-02 SURGERY — PPM GENERATOR CHANGEOUT

## 2022-12-02 MED ORDER — CEFAZOLIN SODIUM-DEXTROSE 2-4 GM/100ML-% IV SOLN
INTRAVENOUS | Status: AC
  Administered 2022-12-02: 2 g via INTRAVENOUS
  Filled 2022-12-02: qty 100

## 2022-12-02 MED ORDER — LIDOCAINE HCL (PF) 1 % IJ SOLN
INTRAMUSCULAR | Status: DC | PRN
  Administered 2022-12-02: 60 mL

## 2022-12-02 MED ORDER — CHLORHEXIDINE GLUCONATE 4 % EX SOLN
4.0000 | Freq: Once | CUTANEOUS | Status: AC
  Administered 2022-12-02: 4 via TOPICAL
  Filled 2022-12-02: qty 60

## 2022-12-02 MED ORDER — SODIUM CHLORIDE 0.9 % IV SOLN: INTRAVENOUS | Status: DC

## 2022-12-02 MED ORDER — MIDAZOLAM HCL 5 MG/5ML IJ SOLN
INTRAMUSCULAR | Status: DC | PRN
  Administered 2022-12-02: 2 mg via INTRAVENOUS

## 2022-12-02 MED ORDER — FENTANYL CITRATE (PF) 100 MCG/2ML IJ SOLN
INTRAMUSCULAR | Status: AC
  Filled 2022-12-02: qty 2

## 2022-12-02 MED ORDER — SODIUM CHLORIDE 0.9 % IV SOLN
INTRAVENOUS | Status: AC
  Administered 2022-12-02: 80 mg
  Filled 2022-12-02: qty 2

## 2022-12-02 MED ORDER — SODIUM CHLORIDE 0.9 % IV SOLN: 80.0000 mg | INTRAVENOUS | Status: AC

## 2022-12-02 MED ORDER — LIDOCAINE HCL 1 % IJ SOLN
INTRAMUSCULAR | Status: AC
  Filled 2022-12-02: qty 40

## 2022-12-02 MED ORDER — FENTANYL CITRATE (PF) 100 MCG/2ML IJ SOLN
INTRAMUSCULAR | Status: DC | PRN
  Administered 2022-12-02: 25 ug via INTRAVENOUS

## 2022-12-02 MED ORDER — MIDAZOLAM HCL 5 MG/5ML IJ SOLN
INTRAMUSCULAR | Status: AC
  Filled 2022-12-02: qty 5

## 2022-12-02 MED ORDER — CEFAZOLIN SODIUM-DEXTROSE 2-4 GM/100ML-% IV SOLN: 2.0000 g | INTRAVENOUS | Status: AC

## 2022-12-02 SURGICAL SUPPLY — 4 items
CABLE SURGICAL S-101-97-12 (CABLE) ×2 IMPLANT
PACEMAKER ASSURITY DR-RF (Pacemaker) IMPLANT
PAD DEFIB RADIO PHYSIO CONN (PAD) ×2 IMPLANT
TRAY PACEMAKER INSERTION (PACKS) ×2 IMPLANT

## 2022-12-02 NOTE — H&P (Signed)
Patient Care Team: [name], MD as PCP - General (Family Medicine) [name], MD as PCP - Electrophysiology (Cardiology)   HPI  [name] [name] is a 82 y.o. male admitted for replacement of  status post pacemaker St Jude implantation 10/11 for sinus node dysfunction     The patient denies chest pain, shortness of breath, nocturnal dyspnea, orthopnea or peripheral edema.  There have been no palpitations, lightheadedness or syncope.      Retired from the [employer] after 40 years  Date   Cr              K         Hgb  3/21 0.83 4.9       13.9 (8/21  8/21  0.99 4.2           13.6   8/24 0.81 4.5 13.7   Records and Results Reviewed  Past Medical History:  Diagnosis Date   Bladder tumor    Cancer [facility])    Cardiac pacemaker in situ 01/27/2010   by dr Graciela Husbands for documented SND,  St Jude dual chamber   GERD (gastroesophageal reflux disease)    History of adenomatous polyp of colon    History of basal cell carcinoma (BCC) excision    forehead and nose area 2015, 2016   History of squamous cell carcinoma excision    excision bilateral temple area  2015, 2016, 2019   History of syncope 2005   2005  w/ recurrent ;  documented sinus node dysfunction,   s/p PPM 2011  (03-20-2022  pt stated many yrs since last syncope)   History of transient ischemic attack (TIA)    Hyperlipidemia    Hypertension    Mild cognitive impairment    Nocturia    OA (osteoarthritis)    OSA on CPAP    followed by dr Vickey Huger;   study in epic 06-23-2021 severe complex apnea syndrome,   pt stated usese cpap nightly   Presence of permanent cardiac pacemaker    RLS (restless legs syndrome)    Rotator cuff tear arthropathy, right    Seizure disorder [facility]) 2021   neurologist--- dr Pearlean Brownie;   started 2021 nocturnal seizures;   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, not intractable , without status epilepticus  (03-20-2022  pt stated last  seizure 01/ 2023)   Sinus node dysfunction [facility]) 2010   cardiologist--- dr Graciela Husbands;  hx recurrent syncope;   04/ 2010  loop recorder implant w/ documented SND;   01-27-2010  s/p PPM dual chamber , St Jude   TIA (transient ischemic attack)    Wears glasses    Wears hearing aid in both ears     Past Surgical History:  Procedure Laterality Date   CARDIAC PACEMAKER PLACEMENT  01/27/2010   @[facility]  by dr Graciela Husbands;   explant loop recorder /  PPM  St Jude dual chamber   COLONOSCOPY WITH PROPOFOL  08/07/2020   dr pyrle   ESOPHAGOGASTRODUODENOSCOPY (EGD) WITH PROPOFOL  02/05/2014   dr pyrtle   LOOP RECORDER IMPLANT  08/17/2008   @[facility]   by dr Graciela Husbands;   after tilt table study normal;  for recurrent syncope   TRANSURETHRAL RESECTION OF BLADDER TUMOR WITH MITOMYCIN-C N/A 03/23/2022   Procedure: TRANSURETHRAL RESECTION OF BLADDER TUMOR WITH POST OPERATIVE GEMCITABINE;  Surgeon: Marcine Matar, MD;  Location: [facility];  Service: Urology;  Laterality: N/A;  45 MINS  TRANSURETHRAL RESECTION OF BLADDER TUMOR WITH MITOMYCIN-C N/A 05/14/2022   Procedure: TRANSURETHRAL RESECTION OF BLADDER TUMOR WITH GEMCITABINE;  Surgeon: Marcine Matar, MD;  Location: [facility];  Service: Urology;  Laterality: N/A;  30 MINS    No current facility-administered medications for this encounter.   Current Outpatient Medications  Medication Sig Dispense Refill   acetaminophen (TYLENOL) 650 MG CR tablet Take 650 mg by mouth daily.     Ascorbic Acid (VITAMIN C PO) Take 1,000 mg by mouth 2 (two) times a week.     benazepril (LOTENSIN) 40 MG tablet TAKE 1 TABLET BY MOUTH EVERY DAY 90 tablet 2   Camphor-Menthol-Methyl Sal (SALONPAS) 3.04-25-08 % PTCH Place 1 patch onto the skin daily as needed (pain).     cholecalciferol (VITAMIN D3) 25 MCG (1000 UNIT) tablet Take 1,000 Units by mouth daily.     Coenzyme Q10 (COQ10 PO) Take 100 mg by mouth daily.     Glycerin-Polysorbate 80 (REFRESH DRY EYE THERAPY OP) Place 1 drop into  both eyes as needed (dry eyes).     levETIRAcetam (KEPPRA) 500 MG tablet Take one tablet in am and two tablets in pm. 270 tablet 3   NON FORMULARY Pt uses a cpap nightly     rosuvastatin (CRESTOR) 5 MG tablet TAKE 1 TABLET BY MOUTH EVERY DAY (Patient taking differently: Take 2.5 mg by mouth every evening.) 90 tablet 3   traZODone (DESYREL) 100 MG tablet Take 1 tablet (100 mg total) by mouth at bedtime as needed. for sleep 90 tablet 3   Turmeric (QC TUMERIC COMPLEX PO) Take 1 capsule by mouth 2 (two) times a week.      No Known Allergies    Social History   Tobacco Use   Smoking status: Former    Current packs/day: 0.00    Types: Cigarettes    Start date: 52    Quit date: 1982    Years since quitting: 42.6   Smokeless tobacco: Never  Vaping Use   Vaping status: Never Used  Substance Use Topics   Alcohol use: Yes    Alcohol/week: 14.0 standard drinks of alcohol    Types: 14 Cans of beer per week    Comment: 03-20-2022 2 beers daily , 12 oz each   Drug use: Yes    Frequency: 7.0 times per week    Types: Marijuana    Comment: 03/22/2022     Family History  Problem Relation Age of Onset   Hypertension Mother    Stroke Mother    Colitis Father    Hypertension Father    Other Father        esophageal stricture   Liver disease Father    Alcohol abuse Maternal Aunt    Rectal cancer Neg Hx    Stomach cancer Neg Hx    Esophageal cancer Neg Hx    Colon polyps Neg Hx    Colon cancer Neg Hx      Current Meds  Medication Sig   acetaminophen (TYLENOL) 650 MG CR tablet Take 650 mg by mouth daily.   Ascorbic Acid (VITAMIN C PO) Take 1,000 mg by mouth 2 (two) times a week.   benazepril (LOTENSIN) 40 MG tablet TAKE 1 TABLET BY MOUTH EVERY DAY   Camphor-Menthol-Methyl Sal (SALONPAS) 3.04-25-08 % PTCH Place 1 patch onto the skin daily as needed (pain).   cholecalciferol (VITAMIN D3) 25 MCG (1000 UNIT) tablet Take 1,000 Units by mouth daily.   Coenzyme Q10 (COQ10 PO) Take 100  mg by  mouth daily.   Glycerin-Polysorbate 80 (REFRESH DRY EYE THERAPY OP) Place 1 drop into both eyes as needed (dry eyes).   levETIRAcetam (KEPPRA) 500 MG tablet Take one tablet in am and two tablets in pm.   NON FORMULARY Pt uses a cpap nightly   rosuvastatin (CRESTOR) 5 MG tablet TAKE 1 TABLET BY MOUTH EVERY DAY (Patient taking differently: Take 2.5 mg by mouth every evening.)   traZODone (DESYREL) 100 MG tablet Take 1 tablet (100 mg total) by mouth at bedtime as needed. for sleep   Turmeric (QC TUMERIC COMPLEX PO) Take 1 capsule by mouth 2 (two) times a week.     Review of Systems negative except from HPI and PMH  Physical Exam There were no vitals taken for this visit. Well developed and well nourished in no acute distress HENT normal E scleral and icterus clear Neck Supple JVP flat; carotids brisk and full Clear to ausculation Device pocket well healed; without hematoma or erythema.  There is no tethering Regular rate and rhythm, no murmurs gallops or rub Soft with active bowel sounds No clubbing cyanosis  Edema Alert and oriented, grossly normal motor and sensory function Skin Warm and Dry    Assessment and  Plan   Sinus node dysfunction   Syncope   Pacemaker-St. Jude    Hypertension   Seizure disorder    For gen replacement    We have reviewed the benefits and risks of generator replacement.  These include but are not limited to lead fracture and infection.  The patient understands, agrees and is willing to proceed.

## 2022-12-02 NOTE — Discharge Instructions (Signed)

## 2022-12-03 ENCOUNTER — Encounter (HOSPITAL_COMMUNITY): Payer: Self-pay | Admitting: Internal Medicine

## 2022-12-08 DIAGNOSIS — C672 Malignant neoplasm of lateral wall of bladder: Secondary | ICD-10-CM | POA: Diagnosis not present

## 2022-12-08 DIAGNOSIS — Z5111 Encounter for antineoplastic chemotherapy: Secondary | ICD-10-CM | POA: Diagnosis not present

## 2022-12-14 DIAGNOSIS — H4922 Sixth [abducent] nerve palsy, left eye: Secondary | ICD-10-CM | POA: Diagnosis not present

## 2022-12-15 DIAGNOSIS — Z5111 Encounter for antineoplastic chemotherapy: Secondary | ICD-10-CM | POA: Diagnosis not present

## 2022-12-15 DIAGNOSIS — C672 Malignant neoplasm of lateral wall of bladder: Secondary | ICD-10-CM | POA: Diagnosis not present

## 2022-12-16 ENCOUNTER — Ambulatory Visit: Payer: Medicare Other | Attending: Internal Medicine

## 2022-12-16 DIAGNOSIS — I495 Sick sinus syndrome: Secondary | ICD-10-CM | POA: Diagnosis not present

## 2022-12-16 NOTE — Patient Instructions (Signed)
   After Your Pacemaker   Monitor your pacemaker site for redness, swelling, and drainage. Call the device clinic at [phone] if you experience these symptoms or fever/chills.  Your incision was closed with Dermabond:  You may shower 1 day after your defibrillator implant and wash your incision with soap and water. Avoid lotions, ointments, or perfumes over your incision until it is well-healed.  You may use a hot tub or a pool after your wound check appointment if the incision is completely closed.  There are no other restrictions in arm movement after your wound check appointment.  You may drive, unless driving has been restricted by your healthcare providers.  Remote monitoring is used to monitor your pacemaker from home. This monitoring is scheduled every 91 days by our office. It allows us to keep an eye on the functioning of your device to ensure it is working properly. You will routinely see your Electrophysiologist annually (more often if necessary).  

## 2022-12-16 NOTE — Progress Notes (Signed)
Wound check appointment. Dermabond removed. Wound without redness or edema. Incision edges approximated, wound well healed. Normal device function. Thresholds, sensing, and impedances consistent with implant measurements. Device programmed at chronic output values. Histogram distribution appropriate for patient and level of activity. 2AMS EGMS illustrate atrial lead noise. 7 ventricular noise reversions noted. No noise noted with pocket stimulation, some noise noted when left arm crossed chest.  Patient educated about wound care, arm mobility. ROV in 3 months with implanting physician.   * Entire PDF failed to upload see scanned report from [facility] and, test results.

## 2022-12-22 DIAGNOSIS — C672 Malignant neoplasm of lateral wall of bladder: Secondary | ICD-10-CM | POA: Diagnosis not present

## 2022-12-22 DIAGNOSIS — Z5111 Encounter for antineoplastic chemotherapy: Secondary | ICD-10-CM | POA: Diagnosis not present

## 2022-12-24 LAB — LAB REPORT - SCANNED
A1c: 5.7
EGFR: 78
TSH: 1.93 (ref 0.41–5.90)

## 2022-12-30 DIAGNOSIS — C672 Malignant neoplasm of lateral wall of bladder: Secondary | ICD-10-CM | POA: Diagnosis not present

## 2023-01-07 ENCOUNTER — Telehealth: Payer: Self-pay | Admitting: Internal Medicine

## 2023-01-07 NOTE — Telephone Encounter (Signed)
Patient is calling with a question regarding a bill he received from his insurance. Patient states he received a letter in the mail from [facility] stating his service was not covered. Patient called [employer] and they were unable to answer what this was for. Please advise.

## 2023-03-04 ENCOUNTER — Ambulatory Visit (INDEPENDENT_AMBULATORY_CARE_PROVIDER_SITE_OTHER): Payer: Medicare Other

## 2023-03-04 DIAGNOSIS — I495 Sick sinus syndrome: Secondary | ICD-10-CM

## 2023-03-04 DIAGNOSIS — G4733 Obstructive sleep apnea (adult) (pediatric): Secondary | ICD-10-CM | POA: Diagnosis not present

## 2023-03-04 LAB — CUP PACEART REMOTE DEVICE CHECK
Battery Remaining Longevity: 126 mo
Battery Remaining Percentage: 95.5 %
Battery Voltage: 3.02 V
Brady Statistic AP VP Percent: 1 %
Brady Statistic AP VS Percent: 7 %
Brady Statistic AS VP Percent: 1 %
Brady Statistic AS VS Percent: 93 %
Brady Statistic RA Percent Paced: 6.6 %
Brady Statistic RV Percent Paced: 1 %
Date Time Interrogation Session: 20241114073827
Implantable Lead Connection Status: 753985
Implantable Lead Connection Status: 753985
Implantable Lead Implant Date: 20111010
Implantable Lead Implant Date: 20111010
Implantable Lead Location: 753859
Implantable Lead Location: 753860
Implantable Pulse Generator Implant Date: 20240814
Lead Channel Impedance Value: 260 Ohm
Lead Channel Impedance Value: 300 Ohm
Lead Channel Pacing Threshold Amplitude: 0.5 V
Lead Channel Pacing Threshold Amplitude: 1.25 V
Lead Channel Pacing Threshold Pulse Width: 0.4 ms
Lead Channel Pacing Threshold Pulse Width: 0.4 ms
Lead Channel Sensing Intrinsic Amplitude: 2.3 mV
Lead Channel Sensing Intrinsic Amplitude: 3.8 mV
Lead Channel Setting Pacing Amplitude: 2 V
Lead Channel Setting Pacing Amplitude: 2.5 V
Lead Channel Setting Pacing Pulse Width: 0.4 ms
Lead Channel Setting Sensing Sensitivity: 0.7 mV
Pulse Gen Model: 2272
Pulse Gen Serial Number: 8201207

## 2023-03-10 ENCOUNTER — Encounter: Payer: Medicare Other | Admitting: Internal Medicine

## 2023-03-17 ENCOUNTER — Ambulatory Visit: Payer: Medicare Other | Admitting: Family Medicine

## 2023-03-17 ENCOUNTER — Encounter: Payer: Self-pay | Admitting: Family Medicine

## 2023-03-17 VITALS — BP 160/62 | HR 75 | Temp 98.0°F | Ht 67.0 in | Wt 150.0 lb

## 2023-03-17 DIAGNOSIS — M542 Cervicalgia: Secondary | ICD-10-CM | POA: Diagnosis not present

## 2023-03-17 DIAGNOSIS — R413 Other amnesia: Secondary | ICD-10-CM | POA: Diagnosis not present

## 2023-03-17 DIAGNOSIS — T161XXA Foreign body in right ear, initial encounter: Secondary | ICD-10-CM

## 2023-03-17 DIAGNOSIS — I10 Essential (primary) hypertension: Secondary | ICD-10-CM | POA: Diagnosis not present

## 2023-03-17 NOTE — Progress Notes (Signed)
Established Patient Office Visit  Subjective   Patient ID: [name] [name], male    DOB: 08/29/1940  Age: 82 y.o. MRN: [id number]  Chief Complaint  Patient presents with   Neck Pain    Patient complains of left sided neck pain, x2 weeks    Nasal Congestion    Patient complains of nasal congestion, x1 week, Tried Mucinex     HPI   [name] is seen today with some left-sided neck pain for the past few weeks.  Denies any injury.  This is close to the region of the sternocleidomastoid muscle.  No radiculitis symptoms.  Somewhat sore to touch.  He thinks he may have noticed little bit of swelling in this region.  No definite nodes.  Also relates couple weeks of some nasal congestion without fever.  Is been taking Mucinex DM.  Only minimal dry cough.  No dyspnea.  He has longstanding history of hypertension and has been on benazepril 40 mg daily for years.  Also has hyperlipidemia and is on low-dose rosuvastatin.  He gets labs through the [place].  Other past medical history significant for hypertension, history of TIA, obstructive sleep apnea, GERD, history of sinus node dysfunction with pacemaker  He also brings up issues today of concern for possible short-term memory loss.  His wife his been concerned about this as well.  Past Medical History:  Diagnosis Date   Bladder tumor    Cancer [facility])    Cardiac pacemaker in situ 01/27/2010   by dr Graciela Husbands for documented SND,  St Jude dual chamber   GERD (gastroesophageal reflux disease)    History of adenomatous polyp of colon    History of basal cell carcinoma (BCC) excision    forehead and nose area 2015, 2016   History of squamous cell carcinoma excision    excision bilateral temple area  2015, 2016, 2019   History of syncope 2005   2005  w/ recurrent ;  documented sinus node dysfunction,   s/p PPM 2011  (03-20-2022  pt stated many yrs since last syncope)   History of transient ischemic attack (TIA)    Hyperlipidemia    Hypertension     Mild cognitive impairment    Nocturia    OA (osteoarthritis)    OSA on CPAP    followed by dr Vickey Huger;   study in epic 06-23-2021 severe complex apnea syndrome,   pt stated usese cpap nightly   Presence of permanent cardiac pacemaker    RLS (restless legs syndrome)    Rotator cuff tear arthropathy, right    Seizure disorder [facility]) 2021   neurologist--- dr Pearlean Brownie;   started 2021 nocturnal seizures;   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, not intractable , without status epilepticus  (03-20-2022  pt stated last seizure 01/ 2023)   Sinus node dysfunction [facility]) 2010   cardiologist--- dr Graciela Husbands;  hx recurrent syncope;   04/ 2010  loop recorder implant w/ documented SND;   01-27-2010  s/p PPM dual chamber , St Jude   TIA (transient ischemic attack)    Wears glasses    Wears hearing aid in both ears    Past Surgical History:  Procedure Laterality Date   CARDIAC PACEMAKER PLACEMENT  01/27/2010   @[facility]  by dr Graciela Husbands;   explant loop recorder /  PPM  St Jude dual chamber   COLONOSCOPY WITH PROPOFOL  08/07/2020   dr pyrle   ESOPHAGOGASTRODUODENOSCOPY (EGD) WITH PROPOFOL  02/05/2014  dr pyrtle   LOOP RECORDER IMPLANT  08/17/2008   @[facility]   by dr Graciela Husbands;   after tilt table study normal;  for recurrent syncope   PPM GENERATOR CHANGEOUT N/A 12/02/2022   Procedure: PPM GENERATOR CHANGEOUT;  Surgeon: [name], MD;  Location: [facility] INVASIVE CV LAB;  Service: Cardiovascular;  Laterality: N/A;   TRANSURETHRAL RESECTION OF BLADDER TUMOR WITH MITOMYCIN-C N/A 03/23/2022   Procedure: TRANSURETHRAL RESECTION OF BLADDER TUMOR WITH POST OPERATIVE GEMCITABINE;  Surgeon: Marcine Matar, MD;  Location: [facility];  Service: Urology;  Laterality: N/A;  45 MINS   TRANSURETHRAL RESECTION OF BLADDER TUMOR WITH MITOMYCIN-C N/A 05/14/2022   Procedure: TRANSURETHRAL RESECTION OF BLADDER TUMOR WITH GEMCITABINE;  Surgeon: Marcine Matar, MD;  Location:  [facility];  Service: Urology;  Laterality: N/A;  30 MINS    reports that he quit smoking about 42 years ago. His smoking use included cigarettes. He started smoking about 67 years ago. He has never used smokeless tobacco. He reports current alcohol use of about 14.0 standard drinks of alcohol per week. He reports current drug use. Frequency: 7.00 times per week. Drug: Marijuana. family history includes Alcohol abuse in his maternal aunt; Colitis in his father; Hypertension in his father and mother; Liver disease in his father; Other in his father; Stroke in his mother. No Known Allergies  Review of Systems  Constitutional:  Negative for chills and fever.  HENT:  Positive for congestion. Negative for ear discharge and ear pain.   Respiratory:  Positive for cough.   Cardiovascular:  Negative for chest pain.  Neurological:  Negative for dizziness.      Objective:     BP (!) 160/62 (BP Location: Left Arm, Patient Position: Sitting, Cuff Size: Normal)   Pulse 75   Temp 98 F (36.7 C) (Oral)   Ht 5\' 7"  (1.702 m)   Wt 150 lb (68 kg)   SpO2 98%   BMI 23.49 kg/m  BP Readings from Last 3 Encounters:  03/17/23 (!) 160/62  12/02/22 (!) 178/72  10/14/22 (!) 133/47   Wt Readings from Last 3 Encounters:  03/17/23 150 lb (68 kg)  12/02/22 159 lb (72.1 kg)  10/14/22 159 lb 6.4 oz (72.3 kg)      Physical Exam Vitals reviewed.  Constitutional:      Appearance: He is well-developed.  HENT:     Ears:     Comments: Rubber piece from his hearing aid right ear was lodged in the ear canal.  We used alligator forceps and were able to gently remove this without difficulty.  Eardrum appears normal    Mouth/Throat:     Mouth: Mucous membranes are moist.     Pharynx: Oropharynx is clear.  Eyes:     Pupils: Pupils are equal, round, and reactive to light.  Neck:     Thyroid: No thyromegaly.     Comments: Neck examined.  No erythema.  No palpated masses.  No adenopathy.  No parotid swelling.  Area  in question left side of neck slightly tender area of sternocleidomastoid muscle but no mass palpated.  No supraclavicular adenopathy. Cardiovascular:     Rate and Rhythm: Normal rate and regular rhythm.  Pulmonary:     Effort: Pulmonary effort is normal. No respiratory distress.     Breath sounds: Normal breath sounds. No wheezing or rales.  Musculoskeletal:     Cervical back: Neck supple.  Lymphadenopathy:     Cervical: No cervical adenopathy.  Neurological:  Mental Status: He is alert and oriented to person, place, and time.      No results found for any visits on 03/17/23.    The ASCVD Risk score (Arnett DK, et al., 2019) failed to calculate for the following reasons:   The 2019 ASCVD risk score is only valid for ages 32 to 68    Assessment & Plan:   #1 left-sided neck pain.  Sounds like more muscular strain likely.  No adenopathy.  No masses palpated.  This does not sound like cervical radiculitis pain.  Recommend conservative measures with heat and consider topical sports cream-but avoid massage around carotid region  #2 hypertension.  Has generally been well-controlled but up today.  Recommend close home monitoring over the next few weeks and bring back readings to reassess here in several weeks.  Be in touch sooner if consistent systolic readings of 140.  Handout on DASH diet given. If blood pressure still up at follow-up consider addition of calcium channel blocker  #3 concern for possible short-term memory loss.  Recommend patient set up 6-week follow-up for more in-depth evaluation with some cognitive testing, possible labs such as B12 and TSH, and discussed possible neuroimaging with MRI.  We have encouraged him to bring his wife back for follow-up to get her feedback as well  #4 foreign body right ear canal- rubber piece of hearing aid retrieved with forceps without difficulty.    Evelena Peat, MD

## 2023-03-17 NOTE — Progress Notes (Signed)
Remote pacemaker transmission.   

## 2023-03-17 NOTE — Patient Instructions (Signed)
Monitor blood pressure at home several times per week and record readings  Bring in your readings here for review at follow up.    Have wife come to next appointment and want to discuss memory loss further at that time

## 2023-03-23 DIAGNOSIS — C672 Malignant neoplasm of lateral wall of bladder: Secondary | ICD-10-CM | POA: Diagnosis not present

## 2023-03-23 DIAGNOSIS — Z5111 Encounter for antineoplastic chemotherapy: Secondary | ICD-10-CM | POA: Diagnosis not present

## 2023-03-30 ENCOUNTER — Encounter: Payer: Self-pay | Admitting: Family Medicine

## 2023-03-30 ENCOUNTER — Telehealth: Payer: Self-pay | Admitting: Family Medicine

## 2023-03-30 ENCOUNTER — Ambulatory Visit: Payer: Medicare Other | Admitting: Family Medicine

## 2023-03-30 VITALS — BP 148/75 | HR 94 | Ht 67.0 in | Wt 153.0 lb

## 2023-03-30 DIAGNOSIS — C672 Malignant neoplasm of lateral wall of bladder: Secondary | ICD-10-CM | POA: Diagnosis not present

## 2023-03-30 DIAGNOSIS — G40909 Epilepsy, unspecified, not intractable, without status epilepticus: Secondary | ICD-10-CM

## 2023-03-30 DIAGNOSIS — G3184 Mild cognitive impairment, so stated: Secondary | ICD-10-CM | POA: Diagnosis not present

## 2023-03-30 DIAGNOSIS — G4739 Other sleep apnea: Secondary | ICD-10-CM | POA: Diagnosis not present

## 2023-03-30 NOTE — Patient Instructions (Addendum)
Below is our plan:  We will continue levetiracetam 500mg  in am and 1000mg  at bedtime. We could consider reducing this dose if you wish. We would restrict driving for 6 months if we choose to reduce med. Continue trazodone 100mg  daily a bed time for now. Please try to use your CPAP every night for as long as you are sleeping.   Discuss trial of Aricept (donepezil) and or Namenda (memantine). We will refer you to neuropsychology for a formal neurocognitive eval.   Please make sure you are staying well hydrated. I recommend 50-60 ounces daily. Well balanced diet and regular exercise encouraged. Consistent sleep schedule with 6-8 hours recommended.   Please continue follow up with care team as directed.   Follow up with me in 6 months   You may receive a survey regarding today's visit. I encourage you to leave honest feed back as I do use this information to improve patient care. Thank you for seeing me today!   Management of Memory Problems   There are some general things you can do to help manage your memory problems.  Your memory may not in fact recover, but by using techniques and strategies you will be able to manage your memory difficulties better.   1)  Establish a routine. Try to establish and then stick to a regular routine.  By doing this, you will get used to what to expect and you will reduce the need to rely on your memory.  Also, try to do things at the same time of day, such as taking your medication or checking your calendar first thing in the morning. Think about think that you can do as a part of a regular routine and make a list.  Then enter them into a daily planner to remind you.  This will help you establish a routine.   2)  Organize your environment. Organize your environment so that it is uncluttered.  Decrease visual stimulation.  Place everyday items such as keys or cell phone in the same place every day (ie.  Basket next to front door) Use post it notes with a brief  message to yourself (ie. Turn off light, lock the door) Use labels to indicate where things go (ie. Which cupboards are for food, dishes, etc.) Keep a notepad and pen by the telephone to take messages   3)  Memory Aids A diary or journal/notebook/daily planner Making a list (shopping list, chore list, to do list that needs to be done) Using an alarm as a reminder (kitchen timer or cell phone alarm) Using cell phone to store information (Notes, Calendar, Reminders) Calendar/White board placed in a prominent position Post-it notes   In order for memory aids to be useful, you need to have good habits.  It's no good remembering to make a note in your journal if you don't remember to look in it.  Try setting aside a certain time of day to look in journal.   4)  Improving mood and managing fatigue. There may be other factors that contribute to memory difficulties.  Factors, such as anxiety, depression and tiredness can affect memory. Regular gentle exercise can help improve your mood and give you more energy. Exercise: there are short videos created by the [employer] on Health specially for older adults: https://bit.ly/2I30q97.  Mediterranean diet: which emphasizes fruits, vegetables, whole grains, legumes, fish, and other seafood; unsaturated fats such as olive oils; and low amounts of red meat, eggs, and sweets. A variation of this,  called MIND [facility] Intervention for Neurodegenerative Delay) incorporates the DASH (Dietary Approaches to Stop Hypertension) diet, which has been shown to lower high blood pressure, a risk factor for Alzheimer's disease. More information at: ExitMarketing.de.  Aerobic exercise that improve heart health is also good for the mind.  [employer] on Aging have short videos for exercises that you can do at home: BlindWorkshop.com.pt Simple relaxation techniques may help  relieve symptoms of anxiety Try to get back to completing activities or hobbies you enjoyed doing in the past. Learn to pace yourself through activities to decrease fatigue. Find out about some local support groups where you can share experiences with others. Try and achieve 7-8 hours of sleep at night.   Tasks to improve attention/working memory 1. Good sleep hygiene (7-8 hrs of sleep) 2. Learning a new skill (Painting, Carpentry, Pottery, new language, Knitting). 3.Cognitive exercises (keep a daily journal, Puzzles) 4. Physical exercise and training  (30 min/day X 4 days week) 5. Being on Antidepressant if needed 6.Yoga, Meditation, Tai Chi 7. Decrease alcohol intake 8.Have a clear schedule and structure in daily routine   MIND Diet: The Mediterranean-DASH Diet Intervention for Neurodegenerative Delay, or MIND diet, targets the health of the aging brain. Research participants with the highest MIND diet scores had a significantly slower rate of cognitive decline compared with those with the lowest scores. The effects of the MIND diet on cognition showed greater effects than either the Mediterranean or the DASH diet alone.   The healthy items the MIND diet guidelines suggest include:   3+ servings a day of whole grains 1+ servings a day of vegetables (other than green leafy) 6+ servings a week of green leafy vegetables 5+ servings a week of nuts 4+ meals a week of beans 2+ servings a week of berries 2+ meals a week of poultry 1+ meals a week of fish Mainly olive oil if added fat is used   The unhealthy items, which are higher in saturated and trans fat, include: Less than 5 servings a week of pastries and sweets Less than 4 servings a week of red meat (including beef, pork, lamb, and products made from these meats) Less than one serving a week of cheese and fried foods Less than 1 tablespoon a day of butter/stick margarine

## 2023-03-30 NOTE — Telephone Encounter (Signed)
Pt said cannot remember things short term has been happening more frequently. My wife notices more than I do. Would like a call from the nurse.

## 2023-03-30 NOTE — Progress Notes (Signed)
PATIENT: [name] [name] DOB: 17-Feb-1941  REASON FOR VISIT: follow up HISTORY FROM: patient  Chief Complaint  Patient presents with   Room 1    Pt is here with wife. Pt states that over the past year his memory has declined. Pt states he will forget his car keys. Pt's wife states that pt has lost a lot of weight.     Sethi: seizures and MCI Dohmeier: OSA   HISTORY OF PRESENT ILLNESS:  03/30/23 ALL:  [name] [name] returns for an acute visit to discuss concerns of worsening memory. He was last seen 09/2022 and reported doing well on CPAP, seizures were well managed on lev 500/1000mg  and memory was stable. MOCA 24/30.   Since, he feels that his memory has steadily worsened over the past year. He reports having more difficulty remembering where she placed items like his phone or keys. His wife reports concerns of him being able to retain information. For example, she tells me about a time when she had fed their dog when [name] [name] was with her and shortly after he could not remember that she had fed the dog. He has been reading the same book for over a year. He has difficulty with recalling recent events. Mrs Montero reports about 20lb weight loss over the past 1-2 years. No changes with appetite. He continues to be active. He plays pickleball regularly. He enjoys playing brain games. He continues to drive. He denies accidents or concerns of getting lost. His wife reports that he drives much slower and more cautious than he has in the past. He drinks 1-2 beers at night.  He continues CPAP therapy nightly. He admits that he usually takes his machine off during the night and after going to the bathroom, he does not restart. Compliance review showed 100% daily compliance with 50% four hour compliance. Average usage 4h , Residual AHI 3/hr.   10/14/2022 ALL:  [name] [name] is a 82 y.o. male here today for follow up for complex on CPAP, insomnia, seizures and MCI. He was last seen by Dr Pearlean Brownie 03/2022. Seizures  were stable on lev 500/1000 and memory felt to be stable.   Since, he reports doing well. He continues lev 500mg  in and 1000mg  in pm. No seizure activity. Last seizure 04/2020. He denies missed doses.   Memory is stable. He continues to drive without difficulty. He helps maintain home. Mrs Bigman manages finances and always has. He is able to run to grocery store. Performs ADLs independently. He continues an active lifestyle with pickle ball and walking. He liks to do crossword puzzles and play sudoku. He is sleeping well. He continues trazodone 100mg  at bedtime. He is not sure he needs to continue. He seems to tolerate it well.   He continues regular follow up with PCP. BP is well managed. He continues asa and rosuvastatin. No recent TIA symptoms.     History (copied from Dr Marlis Edelson previous note)  He returns for follow-up after last visit 6 months ago.  He states he has had no further seizures.  He remains on Keppra 500 mg in the morning and 1 gm at night and is tolerating it well without any side effects.  He continues to have mild short-term memory difficulties which she feels are unchanged and stable and not necessarily getting worse.  He has not been participating in any regular activities for stress relaxation.  He denies any new complaints.  He had follow-up appointment with Dr. Vickey Huger on 09/23/2021.  He has no new complaints today.   HISTORY: (copied from Dr Dohmeier's previous note)  [name] [name] is a 41 y.o. right- handed  Caucasian male patient who seen here  in a sleep consultation   on 10/13/2021 from Dr Pearlean Brownie, who he follows for nocturnal seizures, he once had 4 in one night when he was vacationing in [place]- was hospitalized there. Beginning in 10/ 2021 and had one last seizure January 2023. Dr Pearlean Brownie had assumed that the initial event in [facility] in 2021 was a possible TIA related seizure. Mickie Hillier has a past medical history of Allergy, Arthritis, GERD (gastroesophageal reflux  disease), HYPERLIPIDEMIA (07/17/2008), HYPERTENSION (07/17/2008), Nocturnal Seizures (HCC), Sinus node dysfunction (HCC) (01/27/2011), and Sleep apnea. 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  He hasn't had any further seizures since march and no longer needs his Benedetto Goad driver- he only has Night time seizures.    10-13-2021: I have the pleasure of seeing [name] [name] today he is a patient of Dr. Promotes that he is to whom he had presented with nocturnal seizures recently.  The patient had a diagnostic polysomnography on 06-23-2021 which revealed a very high apnea hypopnea index at 36.9/h and he had 10 minutes total with lower oxygen saturation.  The nadir was 84% this is not concerning by apnea standards he was a very frequent mover at night his periodic limb movement arousal index was 3.4 so he barely slept for half the recording time and even each time he went to sleep he had still significant apnea and periodic limb movements.  He underwent an in-house titration this was performed on 15 March with the help of registered PSG technician Domingo Cocking.  Molli Hazard increased his CPAP pressure to 7 cm setting which is a mild setting by all accounts.   There was a reduction of his apnea to 0.9/h so significant improvement but he still was frequently moving.  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  This has been helped by CPAP.   The patient has a clinical history of restless leg syndrome but he endorsed the Epworth Sleepiness Scale at 0 out of 24 points today his compliance report is excellent 100% of use 7 hours 41 minutes daily over 30 out of 30 days his AutoSet is offering a pressure between 6 and 10 cm water pressure with 1 cm EPR and his residual AHI was 2.0.  The 95th percentile pressure was 9.4 cm water.  There are some obstructive and some central apneas remaining but there was no  Cheyne-Stokes respiration.  I would like to add that he did not have abnormal EEG findings during his attended sleep study.  Sleep relevant medical history: Nocturia 3-5 times- Family medical /sleep history:  no hx; cousin with OSA, insomnia, sleep walkers.  Social history: retired  from [employer], camping and marina-   Patient is working as and lives in a household with spouse, one dog. Adult children. Tobacco use quit 39 years ago- ETOH use 2-3 beers a day-  Smoking pot,  Caffeine intake in form of Coffee( 3 cups a day). Regular exercise in form of  walking, pickle ball. .     Sleep habits are as follows: The patient's dinner time is between 5-7 PM. The patient goes to bed at 10-11 PM and continues to sleep for intervals of 2-3 hours ,  wakes for many,many  bathroom breaks,. He goes easily back to sleep, bedroom is cool, quiet and dark.  The preferred sleep position is sideways, with the support of 1 pillow, bed is adjustable but kept flat. . Dreams are reportedly rare.  7.30  AM is the usual rise time. The patient wakes up spontaneously. He reports  feeling refreshed / restored in AM, but stiffness.. Naps are taken infrequently.   REVIEW OF SYSTEMS: Out of a complete 14 system review of symptoms, the patient complains only of the following symptoms, memory loss, and all other reviewed systems are negative.   ALLERGIES: No Known Allergies  HOME MEDICATIONS: Outpatient Medications Prior to Visit  Medication Sig Dispense Refill   acetaminophen (TYLENOL) 650 MG CR tablet Take 650 mg by mouth daily.     Ascorbic Acid (VITAMIN C PO) Take 1,000 mg by mouth 2 (two) times a week.     benazepril (LOTENSIN) 40 MG tablet TAKE 1 TABLET BY MOUTH EVERY DAY 90 tablet 2   Camphor-Menthol-Methyl Sal (SALONPAS) 3.04-25-08 % PTCH Place 1 patch onto the skin daily as needed (pain).     Glycerin-Polysorbate 80 (REFRESH DRY EYE THERAPY OP) Place 1 drop into both eyes as needed (dry eyes).      levETIRAcetam (KEPPRA) 500 MG tablet Take one tablet in am and two tablets in pm. 270 tablet 3   NON FORMULARY Pt uses a cpap nightly     rosuvastatin (CRESTOR) 5 MG tablet TAKE 1 TABLET BY MOUTH EVERY DAY (Patient taking differently: Take 2.5 mg by mouth every evening.) 90 tablet 3   traZODone (DESYREL) 100 MG tablet Take 1 tablet (100 mg total) by mouth at bedtime as needed. for sleep 90 tablet 3   Turmeric (QC TUMERIC COMPLEX PO) Take 1 capsule by mouth 2 (two) times a week.     cholecalciferol (VITAMIN D3) 25 MCG (1000 UNIT) tablet Take 1,000 Units by mouth daily. (Patient not taking: Reported on 03/30/2023)     No facility-administered medications prior to visit.    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/27/2010   by dr Graciela Husbands for documented SND,  St Jude dual chamber   GERD (gastroesophageal reflux disease)    History of adenomatous polyp of colon    History of basal cell carcinoma (BCC) excision    forehead and nose area 2015, 2016   History of squamous cell carcinoma excision    excision bilateral temple area  2015, 2016, 2019   History of syncope 2005   2005  w/ recurrent ;  documented sinus node dysfunction,   s/p PPM 2011  (03-20-2022  pt stated many yrs since last syncope)   History of transient ischemic attack (TIA)    Hyperlipidemia    Hypertension    Mild cognitive impairment    Nocturia    OA (osteoarthritis)    OSA on CPAP    followed by dr Vickey Huger;   study in epic 06-23-2021 severe complex apnea syndrome,   pt stated usese cpap nightly   Presence of permanent cardiac pacemaker    RLS (restless legs syndrome)    Rotator cuff tear arthropathy, right    Seizure disorder [facility]) 2021   neurologist--- dr Pearlean Brownie;   started 2021 nocturnal seizures;   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, not intractable , without status  epilepticus  (03-20-2022  pt stated last seizure 01/ 2023)   Sinus node dysfunction [facility]) 2010   cardiologist--- dr Graciela Husbands;  hx recurrent syncope;   04/ 2010  loop recorder implant w/ documented SND;   01-27-2010  s/p PPM dual chamber , St Jude   TIA (transient ischemic attack)    Wears glasses    Wears hearing aid in both ears     PAST SURGICAL HISTORY: Past Surgical History:  Procedure Laterality Date   CARDIAC PACEMAKER PLACEMENT  01/27/2010   @[facility]  by dr Graciela Husbands;   explant loop recorder /  PPM  St Jude dual chamber   COLONOSCOPY WITH PROPOFOL  08/07/2020   dr pyrle   ESOPHAGOGASTRODUODENOSCOPY (EGD) WITH PROPOFOL  02/05/2014   dr pyrtle   LOOP RECORDER IMPLANT  08/17/2008   @[facility]   by dr Graciela Husbands;   after tilt table study normal;  for recurrent syncope   PPM GENERATOR CHANGEOUT N/A 12/02/2022   Procedure: PPM GENERATOR CHANGEOUT;  Surgeon: [name], MD;  Location: [facility] INVASIVE CV LAB;  Service: Cardiovascular;  Laterality: N/A;   TRANSURETHRAL RESECTION OF BLADDER TUMOR WITH MITOMYCIN-C N/A 03/23/2022   Procedure: TRANSURETHRAL RESECTION OF BLADDER TUMOR WITH POST OPERATIVE GEMCITABINE;  Surgeon: Marcine Matar, MD;  Location: [facility];  Service: Urology;  Laterality: N/A;  45 MINS   TRANSURETHRAL RESECTION OF BLADDER TUMOR WITH MITOMYCIN-C N/A 05/14/2022   Procedure: TRANSURETHRAL RESECTION OF BLADDER TUMOR WITH GEMCITABINE;  Surgeon: Marcine Matar, MD;  Location: [facility];  Service: Urology;  Laterality: N/A;  30 MINS    FAMILY HISTORY: Family History  Problem Relation Age of Onset   Hypertension Mother    Stroke Mother    Colitis Father    Hypertension Father    Other Father        esophageal stricture   Liver disease Father    Alcohol abuse Maternal Aunt    Rectal cancer Neg Hx    Stomach cancer Neg Hx    Esophageal cancer Neg Hx    Colon polyps Neg Hx    Colon cancer Neg Hx     SOCIAL HISTORY: Social History   Socioeconomic History   Marital  status: Married    Spouse name: Peggy   Number of children: 1   Years of education: Not on file   Highest education level: Bachelor's degree (e.g., BA, AB, BS)  Occupational History   Occupation: CO-OWNER    Employer: [employer]  Tobacco Use   Smoking status: Former    Current packs/day: 0.00    Types: Cigarettes    Start date: 30    Quit date: 1982    Years since quitting: 42.9   Smokeless tobacco: Never  Vaping Use   Vaping status: Never Used  Substance and Sexual Activity   Alcohol use: Yes    Alcohol/week: 14.0 standard drinks of alcohol    Types: 14 Cans of beer per week    Comment: 03-20-2022 2 beers daily , 12 oz each   Drug use: Yes    Frequency: 7.0 times per week    Types: Marijuana    Comment: 03/22/2022   Sexual activity: Not on file  Other Topics Concern   Not on file  Social History Narrative   Lives with spouse   Right Handed   3-4 c caffeine    Social Determinants of Health   Financial Resource Strain: Low Risk  (05/23/2019)   Overall Financial Resource Strain (CARDIA)    Difficulty of Paying Living  Expenses: Not hard at all  Food Insecurity: No Food Insecurity (05/23/2019)   Hunger Vital Sign    Worried About Running Out of Food in the Last Year: Never true    Ran Out of Food in the Last Year: Never true  Transportation Needs: No Transportation Needs (05/23/2019)   PRAPARE - Administrator, Civil Service (Medical): No    Lack of Transportation (Non-Medical): No  Physical Activity: Sufficiently Active (05/23/2019)   Exercise Vital Sign    Days of Exercise per Week: 7 days    Minutes of Exercise per Session: 90 min  Stress: No Stress Concern Present (05/23/2019)   [employer] of Occupational Health - Occupational Stress Questionnaire    Feeling of Stress : Only a little  Social Connections: Unknown (05/23/2019)   Social Connection and Isolation Panel [NHANES]    Frequency of Communication with Friends and Family: More than three  times a week    Frequency of Social Gatherings with Friends and Family: Once a week    Attends Religious Services: Not on Marketing executive or Organizations: Not on file    Attends Banker Meetings: Not on file    Marital Status: Married  Intimate Partner Violence: Not At Risk (04/18/2021)   Humiliation, Afraid, Rape, and Kick questionnaire    Fear of Current or Ex-Partner: No    Emotionally Abused: No    Physically Abused: No    Sexually Abused: No     PHYSICAL EXAM  Vitals:   03/30/23 1525  BP: (!) 148/75  Pulse: 94  Weight: 153 lb (69.4 kg)  Height: 5\' 7"  (1.702 m)    Body mass index is 23.96 kg/m.  Generalized: Well developed, in no acute distress  Cardiology: normal rate and rhythm, no murmur noted Respiratory: clear to auscultation bilaterally  Neurological examination  Mentation: Alert oriented to time, place, history taking. Follows all commands speech and language fluent Cranial nerve II-XII: Pupils were equal round reactive to light. Extraocular movements were full, visual field were full  Motor: The motor testing reveals 5 over 5 strength of all 4 extremities. Good symmetric motor tone is noted throughout.  Gait and station: Gait is normal.    DIAGNOSTIC DATA (LABS, IMAGING, TESTING) - I reviewed patient records, labs, notes, testing and imaging myself where available.     Lab Results  Component Value Date   WBC 6.6 11/25/2022   HGB 13.7 11/25/2022   HCT 42.2 11/25/2022   MCV 94 11/25/2022   PLT 168 11/25/2022      Component Value Date/Time   NA 139 11/25/2022 1230   K 4.5 11/25/2022 1230   CL 100 11/25/2022 1230   CO2 25 11/25/2022 1230   GLUCOSE 85 11/25/2022 1230   GLUCOSE 97 05/07/2022 0943   BUN 20 11/25/2022 1230   CREATININE 0.81 11/25/2022 1230   CREATININE 0.84 04/16/2015 0742   CALCIUM 9.2 11/25/2022 1230   PROT 6.5 07/13/2019 0934   ALBUMIN 4.2 07/13/2019 0934   AST 23 07/13/2019 0934   ALT 20 07/13/2019  0934   ALKPHOS 52 07/13/2019 0934   BILITOT 0.6 07/13/2019 0934   GFRNONAA >60 05/07/2022 0943   GFRAA >60 12/11/2019 0454   Lab Results  Component Value Date   CHOL 171 09/10/2017   HDL 64 09/10/2017   LDLCALC 94 09/10/2017   LDLDIRECT 146.0 07/01/2012   TRIG 63 09/10/2017   CHOLHDL 4 12/22/2016   No results found  for: "HGBA1C" No results found for: "VITAMINB12" No results found for: "TSH"     03/30/2023    4:27 PM 01/19/2018    1:33 PM  MMSE - Mini Mental State Exam  Not completed:  --  Orientation to time 5   Orientation to Place 5   Registration 3   Attention/ Calculation 3   Recall 0   Language- name 2 objects 2   Language- repeat 1   Language- follow 3 step command 3   Language- read & follow direction 1   Write a sentence 1   Copy design 0   Total score 24        03/30/2023    3:29 PM 10/14/2022    3:05 PM  Montreal Cognitive Assessment   Visuospatial/ Executive (0/5) 3 4  Naming (0/3) 3 3  Attention: Read list of digits (0/2) 2 2  Attention: Read list of letters (0/1) 1 1  Attention: Serial 7 subtraction starting at 100 (0/3) 3 3  Language: Repeat phrase (0/2) 2 2  Language : Fluency (0/1) 0 0  Abstraction (0/2) 2 2  Delayed Recall (0/5) 0 1  Orientation (0/6) 6 6  Total 22 24  Adjusted Score (based on education) 22      ASSESSMENT AND PLAN 82 y.o. year old male  has a past medical history of Bladder tumor, Cancer (HCC), Cardiac pacemaker in situ (01/27/2010), GERD (gastroesophageal reflux disease), History of adenomatous polyp of colon, History of basal cell carcinoma (BCC) excision, History of squamous cell carcinoma excision, History of syncope (2005), History of transient ischemic attack (TIA), Hyperlipidemia, Hypertension, Mild cognitive impairment, Nocturia, OA (osteoarthritis), OSA on CPAP, Presence of permanent cardiac pacemaker, RLS (restless legs syndrome), Rotator cuff tear arthropathy, right, Seizure disorder (HCC) (2021), Sinus node  dysfunction (HCC) (2010), TIA (transient ischemic attack), Wears glasses, and Wears hearing aid in both ears. here with     ICD-10-CM   1. Seizure disorder (HCC)  G40.909     2. MCI (mild cognitive impairment)  G31.84 Ambulatory referral to Neuropsychology    3. 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 He is doing well on CPAP therapy but admits to not using it consistently for 4 hours. Compliance report reveals excellent daily but sub optimal four hour compliance. He was encouraged to continue using CPAP nightly and for greater than 4 hours each night. Risks of untreated sleep apnea review and education materials provided. He will continue trazodone 100mg  at bedtime for insomnia for now. He will continue levetiracetam 500mg  in am and 1000mg  in te evenings. We discussed possible weaning seizure medicaitons but he is hesitant of driving restrictions. I have advised extreme caution with driving. MOCA 22/30. MMSE 24/30. Labs offered but declined. He has cardiac history and will discuss use of donepezil with Dr Clide Cliff. Can consider memantine if he wishes. Formal neurocognitive referral placed. Memory compensation strategies reviewed. Healthy lifestyle habits encouraged. He will follow up in 6 months, sooner if needed. He verbalizes understanding and agreement with this plan.    Orders Placed This Encounter  Procedures   Ambulatory referral to Neuropsychology    Referral Priority:   Routine    Referral Type:   Psychiatric    Referral Reason:   Specialty Services Required    Requested Specialty:   Psychology    Number of Visits Requested:   1  No orders of the defined types were placed in this encounter.     Christena Deem 03/30/2023, 4:43 PM [facility] [address], [address] [place], [place] [address] [phone]

## 2023-03-30 NOTE — Telephone Encounter (Signed)
Spoke with patient and Amy said patient can be seen today at 3:30pm, arrive at 3pm to check in. I called patient and Gigi Gin both said yes to visit today.

## 2023-04-01 DIAGNOSIS — L570 Actinic keratosis: Secondary | ICD-10-CM | POA: Diagnosis not present

## 2023-04-01 DIAGNOSIS — L814 Other melanin hyperpigmentation: Secondary | ICD-10-CM | POA: Diagnosis not present

## 2023-04-01 DIAGNOSIS — D225 Melanocytic nevi of trunk: Secondary | ICD-10-CM | POA: Diagnosis not present

## 2023-04-01 DIAGNOSIS — L821 Other seborrheic keratosis: Secondary | ICD-10-CM | POA: Diagnosis not present

## 2023-04-01 DIAGNOSIS — Z85828 Personal history of other malignant neoplasm of skin: Secondary | ICD-10-CM | POA: Diagnosis not present

## 2023-04-05 ENCOUNTER — Encounter: Payer: Self-pay | Admitting: Internal Medicine

## 2023-04-05 ENCOUNTER — Ambulatory Visit: Payer: Medicare Other | Attending: Internal Medicine | Admitting: Internal Medicine

## 2023-04-05 VITALS — BP 134/68 | HR 60 | Ht 67.0 in | Wt 155.4 lb

## 2023-04-05 DIAGNOSIS — I495 Sick sinus syndrome: Secondary | ICD-10-CM

## 2023-04-05 DIAGNOSIS — R55 Syncope and collapse: Secondary | ICD-10-CM

## 2023-04-05 DIAGNOSIS — Z950 Presence of cardiac pacemaker: Secondary | ICD-10-CM | POA: Diagnosis not present

## 2023-04-05 NOTE — Progress Notes (Signed)
Patient Care Team: [name], MD as PCP - General (Family Medicine) [name], MD as PCP - Electrophysiology (Cardiology)   HPI  [name] [name] is a 82 y.o. male admitted for replacement of  status post pacemaker St Jude implantation 10/11 for sinus node dysfunction  Gen rep 8/24 The patient denies chest pain, shortness of breath, nocturnal dyspnea, orthopnea or peripheral edema.  There have been no palpitations, lightheadedness or syncope.  Diuretic dual-chamber pacemaker sinus tach yes please  50 battery life 12.  2 sensing R wave paced less than 6% of the a right and right 1% you have been the look of the noise reversion's yet so that was only thing on the last day on Paceart.    The patient denies chest pain, shortness of breath, nocturnal dyspnea, orthopnea or peripheral edema.  There have been no palpitations, lightheadedness or syncope.   Biggest issue is worsening memory saw neurologist yesterday and got started on Aricept  Date   Cr              K         Hgb  3/21 0.83 4.9       13.9 (8/21  8/21  0.99 4.2           13.6   8/24 0.81 4.5 13.7   Records and Results Reviewed  Past Medical History:  Diagnosis Date   Bladder tumor    Cancer [facility])    Cardiac pacemaker in situ 01/27/2010   by dr Graciela Husbands for documented SND,  St Jude dual chamber   GERD (gastroesophageal reflux disease)    History of adenomatous polyp of colon    History of basal cell carcinoma (BCC) excision    forehead and nose area 2015, 2016   History of squamous cell carcinoma excision    excision bilateral temple area  2015, 2016, 2019   History of syncope 2005   2005  w/ recurrent ;  documented sinus node dysfunction,   s/p PPM 2011  (03-20-2022  pt stated many yrs since last syncope)   History of transient ischemic attack (TIA)    Hyperlipidemia    Hypertension    Mild cognitive impairment    Nocturia    OA (osteoarthritis)    OSA on CPAP    followed by dr Vickey Huger;   study  in epic 06-23-2021 severe complex apnea syndrome,   pt stated usese cpap nightly   Presence of permanent cardiac pacemaker    RLS (restless legs syndrome)    Rotator cuff tear arthropathy, right    Seizure disorder [facility]) 2021   neurologist--- dr Pearlean Brownie;   started 2021 nocturnal seizures;   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, not intractable , without status epilepticus  (03-20-2022  pt stated last seizure 01/ 2023)   Sinus node dysfunction [facility]) 2010   cardiologist--- dr Graciela Husbands;  hx recurrent syncope;   04/ 2010  loop recorder implant w/ documented SND;   01-27-2010  s/p PPM dual chamber , St Jude   TIA (transient ischemic attack)    Wears glasses    Wears hearing aid in both ears     Past Surgical History:  Procedure Laterality Date   CARDIAC PACEMAKER PLACEMENT  01/27/2010   @[facility]  by dr Graciela Husbands;   explant loop recorder /  PPM  St Jude dual chamber   COLONOSCOPY WITH PROPOFOL  08/07/2020   dr Sherlie Ban  ESOPHAGOGASTRODUODENOSCOPY (EGD) WITH PROPOFOL  02/05/2014   dr pyrtle   LOOP RECORDER IMPLANT  08/17/2008   @[facility]   by dr Graciela Husbands;   after tilt table study normal;  for recurrent syncope   PPM GENERATOR CHANGEOUT N/A 12/02/2022   Procedure: PPM GENERATOR CHANGEOUT;  Surgeon: [name], MD;  Location: [facility] INVASIVE CV LAB;  Service: Cardiovascular;  Laterality: N/A;   TRANSURETHRAL RESECTION OF BLADDER TUMOR WITH MITOMYCIN-C N/A 03/23/2022   Procedure: TRANSURETHRAL RESECTION OF BLADDER TUMOR WITH POST OPERATIVE GEMCITABINE;  Surgeon: Marcine Matar, MD;  Location: [facility];  Service: Urology;  Laterality: N/A;  45 MINS   TRANSURETHRAL RESECTION OF BLADDER TUMOR WITH MITOMYCIN-C N/A 05/14/2022   Procedure: TRANSURETHRAL RESECTION OF BLADDER TUMOR WITH GEMCITABINE;  Surgeon: Marcine Matar, MD;  Location: [facility];  Service: Urology;  Laterality: N/A;  30 MINS    Current Outpatient Medications  Medication Sig  Dispense Refill   acetaminophen (TYLENOL) 650 MG CR tablet Take 650 mg by mouth as needed.     Ascorbic Acid (VITAMIN C PO) Take 1,000 mg by mouth 2 (two) times a week.     benazepril (LOTENSIN) 40 MG tablet TAKE 1 TABLET BY MOUTH EVERY DAY 90 tablet 2   Camphor-Menthol-Methyl Sal (SALONPAS) 3.04-25-08 % PTCH Place 1 patch onto the skin daily as needed (pain).     cholecalciferol (VITAMIN D3) 25 MCG (1000 UNIT) tablet Take 1,000 Units by mouth daily.     Glycerin-Polysorbate 80 (REFRESH DRY EYE THERAPY OP) Place 1 drop into both eyes as needed (dry eyes).     levETIRAcetam (KEPPRA) 500 MG tablet Take one tablet in am and two tablets in pm. 270 tablet 3   NON FORMULARY Pt uses a cpap nightly     rosuvastatin (CRESTOR) 5 MG tablet TAKE 1 TABLET BY MOUTH EVERY DAY (Patient taking differently: Take 2.5 mg by mouth every evening.) 90 tablet 3   traZODone (DESYREL) 100 MG tablet Take 1 tablet (100 mg total) by mouth at bedtime as needed. for sleep 90 tablet 3   Turmeric (QC TUMERIC COMPLEX PO) Take 1 capsule by mouth 2 (two) times a week.     No current facility-administered medications for this visit.    No Known Allergies           Current Meds  Medication Sig   acetaminophen (TYLENOL) 650 MG CR tablet Take 650 mg by mouth as needed.   Ascorbic Acid (VITAMIN C PO) Take 1,000 mg by mouth 2 (two) times a week.   benazepril (LOTENSIN) 40 MG tablet TAKE 1 TABLET BY MOUTH EVERY DAY   Camphor-Menthol-Methyl Sal (SALONPAS) 3.04-25-08 % PTCH Place 1 patch onto the skin daily as needed (pain).   cholecalciferol (VITAMIN D3) 25 MCG (1000 UNIT) tablet Take 1,000 Units by mouth daily.   Glycerin-Polysorbate 80 (REFRESH DRY EYE THERAPY OP) Place 1 drop into both eyes as needed (dry eyes).   levETIRAcetam (KEPPRA) 500 MG tablet Take one tablet in am and two tablets in pm.   NON FORMULARY Pt uses a cpap nightly   rosuvastatin (CRESTOR) 5 MG tablet TAKE 1 TABLET BY MOUTH EVERY DAY (Patient taking  differently: Take 2.5 mg by mouth every evening.)   traZODone (DESYREL) 100 MG tablet Take 1 tablet (100 mg total) by mouth at bedtime as needed. for sleep   Turmeric (QC TUMERIC COMPLEX PO) Take 1 capsule by mouth 2 (two) times a week.     Review of Systems negative except  from HPI and PMH  Physical Exam BP 134/68   Pulse 60   Ht 5\' 7"  (1.702 m)   Wt 155 lb 6.4 oz (70.5 kg)   SpO2 95%   BMI 24.34 kg/m  Well developed and well nourished in no acute distress HENT normal right eye ptosis (old compared to his driver's license) Neck supple with JVP-flat Clear Device pocket well healed; without hematoma or erythema.  There is no tethering  Regular rate and rhythm, no  gallop No murmur Abd-soft with active BS No Clubbing cyanosis  edema Skin-warm and dry A & Oriented  Grossly normal sensory and motor function  ECG sinus at 60   19/11/39  Device function is normal.x for increased frequency of noise reversions  (182/32m  compared with 8/25months) and in both leads and reproducible with isometrics per CL-Rn Programming changes   See Paceart for details    Assessment and  Plan   Sinus node dysfunction   Syncope   Pacemaker-St. Jude    Hypertension   Seizure disorder  Noise reversion both leads  Blood pressure well-controlled.  Continue on Lotensin.   Concerned about noise reversion more prevalent on the new device compared to the old device.  The fact that it is in both leads make me concerned that it is related to the header of the new device.  Have reached out to [employer].  As he paces almost never, I think will have little impact on device function or his risk of syncope  Have reviewed with the family.

## 2023-04-05 NOTE — Patient Instructions (Signed)
Medication Instructions:  Your physician recommends that you continue on your current medications as directed. Please refer to the Current Medication list given to you today.  *If you need a refill on your cardiac medications before your next appointment, please call your pharmacy*   Lab Work: None ordered.  If you have labs (blood work) drawn today and your tests are completely normal, you will receive your results only by: MyChart Message (if you have MyChart) OR A paper copy in the mail If you have any lab test that is abnormal or we need to change your treatment, we will call you to review the results.   Testing/Procedures: None ordered.    Follow-Up: At [facility], you and your health needs are our priority.  As part of our continuing mission to provide you with exceptional heart care, we have created designated Provider Care Teams.  These Care Teams include your primary Cardiologist (physician) and Advanced Practice Providers (APPs -  Physician Assistants and Nurse Practitioners) who all work together to provide you with the care you need, when you need it.  We recommend signing up for the patient portal called "MyChart".  Sign up information is provided on this After Visit Summary.  MyChart is used to connect with patients for Virtual Visits (Telemedicine).  Patients are able to view lab/test results, encounter notes, upcoming appointments, etc.  Non-urgent messages can be sent to your provider as well.   To learn more about what you can do with MyChart, go to [email or web address].    Your next appointment:   8 months with Dr Klein 

## 2023-04-06 ENCOUNTER — Telehealth: Payer: Self-pay | Admitting: Family Medicine

## 2023-04-06 ENCOUNTER — Other Ambulatory Visit: Payer: Self-pay | Admitting: Family Medicine

## 2023-04-06 ENCOUNTER — Encounter: Payer: Self-pay | Admitting: Family Medicine

## 2023-04-06 DIAGNOSIS — C672 Malignant neoplasm of lateral wall of bladder: Secondary | ICD-10-CM | POA: Diagnosis not present

## 2023-04-06 MED ORDER — DONEPEZIL HCL 5 MG PO TABS: 5.0000 mg | ORAL_TABLET | Freq: Every day | ORAL | 1 refills | Status: DC

## 2023-04-06 NOTE — Telephone Encounter (Signed)
Pt called and stated that he spoke to his cardiologist and he said that it was ok to take the Aricept. Pt would like to know if an Rx can be called in [facility] [address]. Please advise.

## 2023-04-06 NOTE — Telephone Encounter (Signed)
LMVM for pt and wife, Gigi Gin that Amy NP did sent message thr mychart for you about medication donepezil. Dosing and side effects information on there as well.  Please call back if questions.

## 2023-04-06 NOTE — Telephone Encounter (Signed)
Amy please see the below, Dr.Klein notes are in epic.

## 2023-04-06 NOTE — Telephone Encounter (Signed)
Referral for neuropsychology fax to [facility]. Phone:[phone], Fax: [phone]

## 2023-04-07 MED ORDER — DONEPEZIL HCL 5 MG PO TABS: 5.0000 mg | ORAL_TABLET | Freq: Every day | ORAL | 1 refills | Status: DC

## 2023-04-07 NOTE — Telephone Encounter (Signed)
Called pt, heard wife in background, spoke to pt.  I relayed plan and instructions donepezil 5mg  po daily 4-6 wks, if tolerated will increase to 10mg  po hs.  [facility] [address].  Will resend, done.  He has to go to [place] if new prescription sent in.  Has f/u app 10-04-2022.

## 2023-04-10 LAB — CUP PACEART INCLINIC DEVICE CHECK
Battery Remaining Longevity: 145 mo
Battery Voltage: 3.02 V
Brady Statistic RA Percent Paced: 5.5 %
Brady Statistic RV Percent Paced: 0.02 %
Date Time Interrogation Session: 20241216165800
Implantable Lead Connection Status: 753985
Implantable Lead Connection Status: 753985
Implantable Lead Implant Date: 20111010
Implantable Lead Implant Date: 20111010
Implantable Lead Location: 753859
Implantable Lead Location: 753860
Implantable Pulse Generator Implant Date: 20240814
Lead Channel Impedance Value: 275 Ohm
Lead Channel Impedance Value: 325 Ohm
Lead Channel Pacing Threshold Amplitude: 0.5 V
Lead Channel Pacing Threshold Amplitude: 0.5 V
Lead Channel Pacing Threshold Amplitude: 1.25 V
Lead Channel Pacing Threshold Amplitude: 1.25 V
Lead Channel Pacing Threshold Pulse Width: 0.4 ms
Lead Channel Pacing Threshold Pulse Width: 0.4 ms
Lead Channel Pacing Threshold Pulse Width: 0.4 ms
Lead Channel Pacing Threshold Pulse Width: 0.4 ms
Lead Channel Sensing Intrinsic Amplitude: 2.2 mV
Lead Channel Sensing Intrinsic Amplitude: 5 mV
Lead Channel Setting Pacing Amplitude: 2 V
Lead Channel Setting Pacing Amplitude: 2.5 V
Lead Channel Setting Pacing Pulse Width: 0.4 ms
Lead Channel Setting Sensing Sensitivity: 0.7 mV
Pulse Gen Model: 2272
Pulse Gen Serial Number: 8201207

## 2023-04-28 ENCOUNTER — Encounter: Payer: Self-pay | Admitting: Family Medicine

## 2023-04-28 ENCOUNTER — Ambulatory Visit (INDEPENDENT_AMBULATORY_CARE_PROVIDER_SITE_OTHER): Payer: Self-pay | Admitting: Family Medicine

## 2023-04-28 VITALS — BP 138/60 | HR 77 | Temp 97.7°F | Wt 152.6 lb

## 2023-04-28 DIAGNOSIS — I10 Essential (primary) hypertension: Secondary | ICD-10-CM

## 2023-04-28 DIAGNOSIS — G3184 Mild cognitive impairment, so stated: Secondary | ICD-10-CM

## 2023-04-28 DIAGNOSIS — C679 Malignant neoplasm of bladder, unspecified: Secondary | ICD-10-CM

## 2023-04-28 NOTE — Progress Notes (Signed)
 Established Patient Office Visit  Subjective   Patient ID: [name] [name], male    DOB: 03/14/1941  Age: 83 y.o. MRN: [id number]  No chief complaint on file.   HPI   [name] [name] is seen today accompanied by wife.  Refer to last note for details Chronic problems include a history of hypertension, SA node dysfunction, history of TIA, obstructive sleep apnea, GERD, hyperlipidemia, Saint Jude pacemaker.  He also has bladder cancer followed by urology and has been treated with BCG injections.  His previous urologist retired and he is in process of transitioning to another urologist now.    His blood pressure was up last visit.  He brings in a log of readings today in some of these been up as high as 150s occasionally 160s systolic.  He is on benazepril  40 mg daily and states he has been compliant with therapy.  No recent headaches.  No chest pains.  Had a couple recent visits through cardiology and other providers where blood pressure was reasonably normal.  He brought concerns last time for possible short-term memory loss.  We had recommended follow-up today for some additional labs and cognitive screening.  However, he saw neurologist since last visit and they performed MMSE which was 24/30.  He was started on Aricept  5 mg daily.  No further labs are done at that time.  He did have recent labs through the [place] and had chemistries, lipids, TSH, A1c which were all basically normal.  No recent B12 level.  Wife thinks he has had some memory concerns more gradually and not sudden.   Past Medical History:  Diagnosis Date   Bladder tumor    Cancer [facility])    Cardiac pacemaker in situ 01/27/2010   by dr fernande for documented SND,  St Jude dual chamber   GERD (gastroesophageal reflux disease)    History of adenomatous polyp of colon    History of basal cell carcinoma (BCC) excision    forehead and nose area 2015, 2016   History of squamous cell carcinoma excision    excision bilateral temple area   2015, 2016, 2019   History of syncope 2005   2005  w/ recurrent ;  documented sinus node dysfunction,   s/p PPM 2011  (03-20-2022  pt stated many yrs since last syncope)   History of transient ischemic attack (TIA)    Hyperlipidemia    Hypertension    Mild cognitive impairment    Nocturia    OA (osteoarthritis)    OSA on CPAP    followed by dr chalice;   study in epic 06-23-2021 severe complex apnea syndrome,   pt stated usese cpap nightly   Presence of permanent cardiac pacemaker    RLS (restless legs syndrome)    Rotator cuff tear arthropathy, right    Seizure disorder [facility]) 2021   neurologist--- dr rosemarie;   started 2021 nocturnal seizures;   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, not intractable , without status epilepticus  (03-20-2022  pt stated last seizure 01/ 2023)   Sinus node dysfunction [facility]) 2010   cardiologist--- dr fernande;  hx recurrent syncope;   04/ 2010  loop recorder implant w/ documented SND;   01-27-2010  s/p PPM dual chamber , St Jude   TIA (transient ischemic attack)    Wears glasses    Wears hearing aid in both ears    Past Surgical History:  Procedure Laterality Date   CARDIAC PACEMAKER  PLACEMENT  01/27/2010   @[facility]  by dr fernande;   explant loop recorder /  PPM  St Jude dual chamber   COLONOSCOPY WITH PROPOFOL   08/07/2020   dr pyrle   ESOPHAGOGASTRODUODENOSCOPY (EGD) WITH PROPOFOL   02/05/2014   dr pyrtle   LOOP RECORDER IMPLANT  08/17/2008   @[facility]   by dr fernande;   after tilt table study normal;  for recurrent syncope   PPM GENERATOR CHANGEOUT N/A 12/02/2022   Procedure: PPM GENERATOR CHANGEOUT;  Surgeon: Fernande Elspeth BROCKS, MD;  Location: [facility] INVASIVE CV LAB;  Service: Cardiovascular;  Laterality: N/A;   TRANSURETHRAL RESECTION OF BLADDER TUMOR WITH MITOMYCIN -C N/A 03/23/2022   Procedure: TRANSURETHRAL RESECTION OF BLADDER TUMOR WITH POST OPERATIVE GEMCITABINE ;  Surgeon: Matilda Senior, MD;  Location: [facility];  Service: Urology;  Laterality: N/A;  45 MINS   TRANSURETHRAL RESECTION OF BLADDER TUMOR WITH MITOMYCIN -C N/A 05/14/2022   Procedure: TRANSURETHRAL RESECTION OF BLADDER TUMOR WITH GEMCITABINE ;  Surgeon: Matilda Senior, MD;  Location: [facility];  Service: Urology;  Laterality: N/A;  30 MINS    reports that he quit smoking about 43 years ago. His smoking use included cigarettes. He started smoking about 68 years ago. He has never used smokeless tobacco. He reports current alcohol use of about 14.0 standard drinks of alcohol per week. He reports current drug use. Frequency: 7.00 times per week. Drug: Marijuana. family history includes Alcohol abuse in his maternal aunt; Colitis in his father; Hypertension in his father and mother; Liver disease in his father; Other in his father; Stroke in his mother. No Known Allergies  Review of Systems  Constitutional:  Negative for chills, fever and malaise/fatigue.  Eyes:  Negative for blurred vision.  Respiratory:  Negative for shortness of breath.   Cardiovascular:  Negative for chest pain.  Gastrointestinal:  Negative for abdominal pain.  Genitourinary:  Negative for dysuria.  Neurological:  Negative for dizziness, weakness and headaches.      Objective:     BP 138/60 (BP Location: Left Arm, Cuff Size: Normal)   Pulse 77   Temp 97.7 F (36.5 C) (Oral)   Wt 152 lb 9.6 oz (69.2 kg)   SpO2 99%   BMI 23.90 kg/m  BP Readings from Last 3 Encounters:  04/28/23 138/60  04/05/23 134/68  03/30/23 (!) 148/75   Wt Readings from Last 3 Encounters:  04/28/23 152 lb 9.6 oz (69.2 kg)  04/05/23 155 lb 6.4 oz (70.5 kg)  03/30/23 153 lb (69.4 kg)      Physical Exam Vitals reviewed.  Constitutional:      General: He is not in acute distress.    Appearance: He is well-developed. He is not ill-appearing.  HENT:     Right Ear: External ear normal.     Left Ear: External ear normal.  Eyes:     Pupils: Pupils are equal, round, and  reactive to light.  Neck:     Thyroid : No thyromegaly.  Cardiovascular:     Rate and Rhythm: Normal rate and regular rhythm.  Pulmonary:     Effort: Pulmonary effort is normal. No respiratory distress.     Breath sounds: Normal breath sounds. No wheezing or rales.  Musculoskeletal:     Cervical back: Neck supple.     Right lower leg: No edema.     Left lower leg: No edema.  Neurological:     Mental Status: He is alert and oriented to person, place, and time.  No results found for any visits on 04/28/23.    The ASCVD Risk score (Arnett DK, et al., 2019) failed to calculate for the following reasons:   The 2019 ASCVD risk score is only valid for ages 63 to 20    Assessment & Plan:   #1 hypertension.  Currently treated with benazepril  40 mg daily.  Home blood pressure readings have been quite up-and-down.  His initial blood pressure was up here today but repeat left pharmacy after rest 138/60.  We have recommend ongoing home monitoring.  Consider bring in his cuff to compare with ours next visit.  Try to keep daily sodium intake less than 2000 mg.  Stay active with walking is much as possible  #2 cognitive impairment.  Has discussed and been evaluated by neurology.  Recently went on Aricept .  He does have concerns where he is having side effect from Aricept  and he will discuss with neurology.  May be a candidate for alternative medicine such as Namenda.  Would consider B12 with next lab draw. -Referral has been placed to neuropsychology per neurologist -Regular use of CPAP  #3 history of bladder cancer treated per urology.  Wolm Scarlet, MD

## 2023-04-28 NOTE — Patient Instructions (Signed)
 Let Neurology know if not tolerating the Aricept  Consider bringing in your BP cuff next visit to compare with ours.    Omron should be a decent brand of home monitor.

## 2023-04-30 DIAGNOSIS — N35013 Post-traumatic anterior urethral stricture: Secondary | ICD-10-CM | POA: Diagnosis not present

## 2023-04-30 DIAGNOSIS — C672 Malignant neoplasm of lateral wall of bladder: Secondary | ICD-10-CM | POA: Diagnosis not present

## 2023-06-03 ENCOUNTER — Ambulatory Visit (INDEPENDENT_AMBULATORY_CARE_PROVIDER_SITE_OTHER): Payer: Medicare Other

## 2023-06-03 DIAGNOSIS — I495 Sick sinus syndrome: Secondary | ICD-10-CM

## 2023-06-05 LAB — CUP PACEART REMOTE DEVICE CHECK
Battery Remaining Longevity: 124 mo
Battery Remaining Percentage: 95.5 %
Battery Voltage: 3.02 V
Brady Statistic AP VP Percent: 1 %
Brady Statistic AP VS Percent: 4.6 %
Brady Statistic AS VP Percent: 1 %
Brady Statistic AS VS Percent: 95 %
Brady Statistic RA Percent Paced: 4.3 %
Brady Statistic RV Percent Paced: 1 %
Date Time Interrogation Session: 20250213085007
Implantable Lead Connection Status: 753985
Implantable Lead Connection Status: 753985
Implantable Lead Implant Date: 20111010
Implantable Lead Implant Date: 20111010
Implantable Lead Location: 753859
Implantable Lead Location: 753860
Implantable Pulse Generator Implant Date: 20240814
Lead Channel Impedance Value: 260 Ohm
Lead Channel Impedance Value: 300 Ohm
Lead Channel Pacing Threshold Amplitude: 0.5 V
Lead Channel Pacing Threshold Amplitude: 1.25 V
Lead Channel Pacing Threshold Pulse Width: 0.4 ms
Lead Channel Pacing Threshold Pulse Width: 0.4 ms
Lead Channel Sensing Intrinsic Amplitude: 2.3 mV
Lead Channel Sensing Intrinsic Amplitude: 3.9 mV
Lead Channel Setting Pacing Amplitude: 2 V
Lead Channel Setting Pacing Amplitude: 2.5 V
Lead Channel Setting Pacing Pulse Width: 0.4 ms
Lead Channel Setting Sensing Sensitivity: 0.7 mV
Pulse Gen Model: 2272
Pulse Gen Serial Number: 8201207

## 2023-06-21 ENCOUNTER — Encounter: Payer: Self-pay | Admitting: Internal Medicine

## 2023-07-06 NOTE — Addendum Note (Signed)
 Addended by: Elease Etienne A on: 07/06/2023 02:35 PM   Modules accepted: Orders

## 2023-07-06 NOTE — Progress Notes (Signed)
 Remote pacemaker transmission.

## 2023-07-26 DIAGNOSIS — I499 Cardiac arrhythmia, unspecified: Secondary | ICD-10-CM | POA: Diagnosis not present

## 2023-07-26 DIAGNOSIS — H9193 Unspecified hearing loss, bilateral: Secondary | ICD-10-CM | POA: Diagnosis not present

## 2023-07-26 DIAGNOSIS — E785 Hyperlipidemia, unspecified: Secondary | ICD-10-CM | POA: Diagnosis not present

## 2023-07-26 DIAGNOSIS — H547 Unspecified visual loss: Secondary | ICD-10-CM | POA: Diagnosis not present

## 2023-07-26 DIAGNOSIS — I10 Essential (primary) hypertension: Secondary | ICD-10-CM | POA: Diagnosis not present

## 2023-07-26 DIAGNOSIS — M1990 Unspecified osteoarthritis, unspecified site: Secondary | ICD-10-CM | POA: Diagnosis not present

## 2023-07-26 DIAGNOSIS — G4700 Insomnia, unspecified: Secondary | ICD-10-CM | POA: Diagnosis not present

## 2023-07-26 DIAGNOSIS — R569 Unspecified convulsions: Secondary | ICD-10-CM | POA: Diagnosis not present

## 2023-07-26 DIAGNOSIS — Z8551 Personal history of malignant neoplasm of bladder: Secondary | ICD-10-CM | POA: Diagnosis not present

## 2023-07-26 DIAGNOSIS — F0390 Unspecified dementia without behavioral disturbance: Secondary | ICD-10-CM | POA: Diagnosis not present

## 2023-07-26 DIAGNOSIS — G250 Essential tremor: Secondary | ICD-10-CM | POA: Diagnosis not present

## 2023-07-26 DIAGNOSIS — G8929 Other chronic pain: Secondary | ICD-10-CM | POA: Diagnosis not present

## 2023-08-31 ENCOUNTER — Encounter: Payer: Self-pay | Admitting: Family Medicine

## 2023-08-31 ENCOUNTER — Ambulatory Visit (INDEPENDENT_AMBULATORY_CARE_PROVIDER_SITE_OTHER): Payer: Self-pay | Admitting: Family Medicine

## 2023-08-31 VITALS — BP 128/68 | HR 56 | Temp 97.8°F | Wt 156.1 lb

## 2023-08-31 DIAGNOSIS — B3789 Other sites of candidiasis: Secondary | ICD-10-CM | POA: Diagnosis not present

## 2023-08-31 DIAGNOSIS — R040 Epistaxis: Secondary | ICD-10-CM | POA: Diagnosis not present

## 2023-08-31 DIAGNOSIS — I10 Essential (primary) hypertension: Secondary | ICD-10-CM | POA: Diagnosis not present

## 2023-08-31 MED ORDER — FLUCONAZOLE 100 MG PO TABS: 100.0000 mg | ORAL_TABLET | Freq: Every day | ORAL | 0 refills | Status: DC

## 2023-08-31 NOTE — Patient Instructions (Signed)
 STOP the nasal steroids  Consider switch Claritan to Allegra or Xyzal  Take the Fluconoazole once daily for 7 days and set up 2 week follow up.

## 2023-08-31 NOTE — Progress Notes (Unsigned)
 [facility] [address] [place] [address] Phone: [phone] Subjective:   IBryan Caprio, am serving as a scribe for Dr. Ronnell Coins.  I'm seeing this patient by the request  of:  Marquetta Sit, MD  CC: Right hip pain  [id number]  ROBLEY DUTTON is a 83 y.o. male coming in with complaint of R hip pain. Last seen in January 2024 for shoulder pain. Patient states neck is also tender on the L side. Pain over the SI joint that has plateau.     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/27/2010   by dr Rodolfo Clan for documented SND,  St Jude dual chamber   GERD (gastroesophageal reflux disease)    History of adenomatous polyp of colon    History of basal cell carcinoma (BCC) excision    forehead and nose area 2015, 2016   History of squamous cell carcinoma excision    excision bilateral temple area  2015, 2016, 2019   History of syncope 2005   2005  w/ recurrent ;  documented sinus node dysfunction,   s/p PPM 2011  (03-20-2022  pt stated many yrs since last syncope)   History of transient ischemic attack (TIA)    Hyperlipidemia    Hypertension    Mild cognitive impairment    Nocturia    OA (osteoarthritis)    OSA on CPAP    followed by dr Albertina Hugger;   study in epic 06-23-2021 severe complex apnea syndrome,   pt stated usese cpap nightly   Presence of permanent cardiac pacemaker    RLS (restless legs syndrome)    Rotator cuff tear arthropathy, right    Seizure disorder [facility]) 2021   neurologist--- dr Janett Medin;   started 2021 nocturnal seizures;   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, not intractable , without status epilepticus  (03-20-2022  pt stated last seizure 01/ 2023)   Sinus node dysfunction [facility]) 2010   cardiologist--- dr Rodolfo Clan;  hx recurrent syncope;   04/ 2010  loop recorder implant w/ documented SND;    01-27-2010  s/p PPM dual chamber , St Jude   TIA (transient ischemic attack)    Wears glasses    Wears hearing aid in both ears    Past Surgical History:  Procedure Laterality Date   CARDIAC PACEMAKER PLACEMENT  01/27/2010   @[facility]  by dr Rodolfo Clan;   explant loop recorder /  PPM  St Jude dual chamber   COLONOSCOPY WITH PROPOFOL   08/07/2020   dr pyrle   ESOPHAGOGASTRODUODENOSCOPY (EGD) WITH PROPOFOL   02/05/2014   dr pyrtle   LOOP RECORDER IMPLANT  08/17/2008   @[facility]   by dr Rodolfo Clan;   after tilt table study normal;  for recurrent syncope   PPM GENERATOR CHANGEOUT N/A 12/02/2022   Procedure: PPM GENERATOR CHANGEOUT;  Surgeon: Verona Goodwill, MD;  Location: [facility] INVASIVE CV LAB;  Service: Cardiovascular;  Laterality: N/A;   TRANSURETHRAL RESECTION OF BLADDER TUMOR WITH MITOMYCIN -C N/A 03/23/2022   Procedure: TRANSURETHRAL RESECTION OF BLADDER TUMOR WITH POST OPERATIVE GEMCITABINE ;  Surgeon: Trent Frizzle, MD;  Location: [facility];  Service: Urology;  Laterality: N/A;  45 MINS   TRANSURETHRAL RESECTION OF BLADDER TUMOR WITH MITOMYCIN -C N/A 05/14/2022   Procedure: TRANSURETHRAL RESECTION OF BLADDER TUMOR WITH GEMCITABINE ;  Surgeon: Trent Frizzle, MD;  Location: [facility];  Service: Urology;  Laterality: N/A;  30 MINS   Social History   Socioeconomic History   Marital status: Married    Spouse name: Peggy   Number of children: 1   Years of education: Not on file   Highest education level: Bachelor's degree (e.g., BA, AB, BS)  Occupational History   Occupation: CO-OWNER    Employer: [employer]   Tobacco Use   Smoking status: Former    Current packs/day: 0.00    Types: Cigarettes    Start date: 7    Quit date: 1982    Years since quitting: 43.3   Smokeless tobacco: Never  Vaping Use   Vaping status: Never Used  Substance and Sexual Activity   Alcohol use: Yes    Alcohol/week: 14.0 standard drinks of alcohol    Types: 14 Cans of beer per week    Comment:  03-20-2022 2 beers daily , 12 oz each   Drug use: Yes    Frequency: 7.0 times per week    Types: Marijuana    Comment: 03/22/2022   Sexual activity: Not on file  Other Topics Concern   Not on file  Social History Narrative   Lives with spouse   Right Handed   3-4 c caffeine    Social Drivers of Health   Financial Resource Strain: Low Risk  (05/23/2019)   Overall Financial Resource Strain (CARDIA)    Difficulty of Paying Living Expenses: Not hard at all  Food Insecurity: No Food Insecurity (05/23/2019)   Hunger Vital Sign    Worried About Running Out of Food in the Last Year: Never true    Ran Out of Food in the Last Year: Never true  Transportation Needs: No Transportation Needs (05/23/2019)   PRAPARE - Administrator, Civil Service (Medical): No    Lack of Transportation (Non-Medical): No  Physical Activity: Sufficiently Active (05/23/2019)   Exercise Vital Sign    Days of Exercise per Week: 7 days    Minutes of Exercise per Session: 90 min  Stress: No Stress Concern Present (05/23/2019)   [employer] of Occupational Health - Occupational Stress Questionnaire    Feeling of Stress : Only a little  Social Connections: Unknown (05/23/2019)   Social Connection and Isolation Panel [NHANES]    Frequency of Communication with Friends and Family: More than three times a week    Frequency of Social Gatherings with Friends and Family: Once a week    Attends Religious Services: Not on Marketing executive or Organizations: Not on file    Attends Banker Meetings: Not on file    Marital Status: Married   No Known Allergies Family History  Problem Relation Age of Onset   Hypertension Mother    Stroke Mother    Colitis Father    Hypertension Father    Other Father        esophageal stricture   Liver disease Father    Alcohol abuse Maternal Aunt    Rectal cancer Neg Hx    Stomach cancer Neg Hx    Esophageal cancer Neg Hx    Colon polyps Neg Hx     Colon cancer Neg Hx      Current Outpatient Medications (Cardiovascular):    benazepril  (LOTENSIN ) 40 MG tablet, TAKE 1 TABLET BY MOUTH EVERY DAY   rosuvastatin  (CRESTOR ) 5 MG tablet, TAKE 1 TABLET BY MOUTH EVERY DAY (Patient taking differently: Take 2.5 mg by mouth every evening.)  Current Outpatient Medications (Other):    Ascorbic Acid (VITAMIN C PO), Take 1,000 mg by mouth 2 (two) times a week.   cholecalciferol (VITAMIN D3) 25 MCG (1000 UNIT) tablet, Take 1,000 Units by mouth daily.   donepezil  (ARICEPT ) 5 MG tablet, Take 1 tablet (5 mg total) by mouth at bedtime.   fluconazole (DIFLUCAN) 100 MG tablet, Take 1 tablet (100 mg total) by mouth daily.   levETIRAcetam  (KEPPRA ) 500 MG tablet, Take one tablet in am and two tablets in pm.   NON FORMULARY, Pt uses a cpap nightly   traZODone  (DESYREL ) 100 MG tablet, Take 1 tablet (100 mg total) by mouth at bedtime as needed. for sleep   Turmeric (QC TUMERIC COMPLEX PO), Take 1 capsule by mouth 2 (two) times a week.   Reviewed prior external information including notes and imaging from  primary care provider As well as notes that were available from care everywhere and other healthcare systems.  Past medical history, social, surgical and family history all reviewed in electronic medical record.  No pertanent information unless stated regarding to the chief complaint.   Review of Systems:  No headache, visual changes, nausea, vomiting, diarrhea, constipation, dizziness, abdominal pain, skin rash, fevers, chills, night sweats, weight loss, swollen lymph nodes, body aches, joint swelling, chest pain, shortness of breath, mood changes. POSITIVE muscle aches  Objective  Blood pressure 138/64, pulse 69, height 5\' 7"  (1.702 m), weight 155 lb (70.3 kg), SpO2 98%.   General: No apparent distress alert and oriented x3 mood and affect normal, dressed appropriately.  HEENT: Pupils equal, extraocular movements intact  Respiratory: Patient's speak  in full sentences and does not appear short of breath  Cardiovascular: No lower extremity edema, non tender, no erythema  Neck exam shows significant loss lordosis noted.  Patient has minimal sidebending noted.  Less than 5 degrees of extension of the neck noted.  Negative Spurling's but significant trigger points noted in the levator scapula, rhomboid and trapezius.  After verbal consent patient was prepped with alcohol swab and with a 25-gauge half inch needle injected into 4 distinct trigger points in the trapezius, rhomboid and levator scapula.  Total of 3 cc of 0.5% Marcaine and 1 cc of Kenalog  40 mg/mL used. Blood loss.  Band-Aid placed.  Postinjection instructions given.    Impression and Recommendations:    The above documentation has been reviewed and is accurate and complete Malak Duchesneau M Sayla Golonka, DO

## 2023-08-31 NOTE — Progress Notes (Signed)
 Established Patient Office Visit  Subjective   Patient ID: [name] [name], male    DOB: [birth year]  Age: 83 y.o. MRN: [id number]  Chief Complaint  Patient presents with   Sinusitis   Epistaxis    HPI   [name] [name] is seen with several month history of intermittent left-sided nosebleed and some nasal congestion and irritation.  Denies any purulent secretions.  No fever.  Currently takes Claritin intermittently.  Has been using some type of nasal steroid but he is not sure if this is Nasacort  AQ or fluticasone.  Denies any sore throat.  Has daily nasal congestion and "sniffles ".  He is not aware of any chronic perennial allergies.  No recent cough.  No headaches or facial pain.  Hypertension treated with benazepril  40 mg daily.  Compliant with therapy.  No recent dizziness.  Stays active with walking and pickleball.  Has had some memory impairment and currently followed by neurology and on low-dose Aricept .  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   by dr Rodolfo Clan for documented SND,  St Jude dual chamber   GERD (gastroesophageal reflux disease)    History of adenomatous polyp of colon    History of basal cell carcinoma (BCC) excision    forehead and nose area 2015, 2016   History of squamous cell carcinoma excision    excision bilateral temple area  2015, 2016, 2019   History of syncope 2005   2005  w/ recurrent ;  documented sinus node dysfunction,   s/p PPM 2011  (03-20-2022  pt stated many yrs since last syncope)   History of transient ischemic attack (TIA)    Hyperlipidemia    Hypertension    Mild cognitive impairment    Nocturia    OA (osteoarthritis)    OSA on CPAP    followed by dr Albertina Hugger;   study in epic 06-23-2021 severe complex apnea syndrome,   pt stated usese cpap nightly   Presence of permanent cardiac pacemaker     RLS (restless legs syndrome)    Rotator cuff tear arthropathy, right    Seizure disorder [facility]) 2021   neurologist--- dr Janett Medin;   started 2021 nocturnal seizures;   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, not intractable , without status epilepticus  (03-20-2022  pt stated last seizure 01/ 2023)   Sinus node dysfunction [facility]) 2010   cardiologist--- dr Rodolfo Clan;  hx recurrent syncope;   04/ 2010  loop recorder implant w/ documented SND;   01-27-2010  s/p PPM dual chamber , St Jude   TIA (transient ischemic attack)    Wears glasses    Wears hearing aid in both ears    Past Surgical History:  Procedure Laterality Date   CARDIAC PACEMAKER PLACEMENT  01/27/2010   @[facility]  by dr Rodolfo Clan;   explant loop recorder /  PPM  St Jude dual chamber   COLONOSCOPY WITH PROPOFOL   08/07/2020   dr pyrle   ESOPHAGOGASTRODUODENOSCOPY (EGD) WITH PROPOFOL   02/05/2014   dr pyrtle   LOOP RECORDER IMPLANT  08/17/2008   @[facility]   by dr Rodolfo Clan;   after tilt table study normal;  for recurrent syncope   PPM GENERATOR CHANGEOUT N/A 12/02/2022   Procedure: PPM GENERATOR CHANGEOUT;  Surgeon: Verona Goodwill, MD;  Location: [facility] INVASIVE CV  LAB;  Service: Cardiovascular;  Laterality: N/A;   TRANSURETHRAL RESECTION OF BLADDER TUMOR WITH MITOMYCIN -C N/A 03/23/2022   Procedure: TRANSURETHRAL RESECTION OF BLADDER TUMOR WITH POST OPERATIVE GEMCITABINE ;  Surgeon: Trent Frizzle, MD;  Location: [facility];  Service: Urology;  Laterality: N/A;  45 MINS   TRANSURETHRAL RESECTION OF BLADDER TUMOR WITH MITOMYCIN -C N/A 05/14/2022   Procedure: TRANSURETHRAL RESECTION OF BLADDER TUMOR WITH GEMCITABINE ;  Surgeon: Trent Frizzle, MD;  Location: [facility];  Service: Urology;  Laterality: N/A;  30 MINS    reports that he quit smoking about 43 years ago. His smoking use included cigarettes. He started smoking about 68 years ago. He has never used smokeless tobacco. He reports current  alcohol use of about 14.0 standard drinks of alcohol per week. He reports current drug use. Frequency: 7.00 times per week. Drug: Marijuana. family history includes Alcohol abuse in his maternal aunt; Colitis in his father; Hypertension in his father and mother; Liver disease in his father; Other in his father; Stroke in his mother. No Known Allergies  Review of Systems  Constitutional:  Negative for chills, fever and malaise/fatigue.  HENT:  Positive for congestion and nosebleeds. Negative for ear pain and sinus pain.   Eyes:  Negative for blurred vision.  Respiratory:  Negative for shortness of breath.   Cardiovascular:  Negative for chest pain.  Neurological:  Negative for dizziness, weakness and headaches.      Objective:     BP 128/68 (BP Location: Left Arm, Cuff Size: Normal)   Pulse (!) 56   Temp 97.8 F (36.6 C) (Oral)   Wt 156 lb 1.6 oz (70.8 kg)   SpO2 99%   BMI 24.45 kg/m  BP Readings from Last 3 Encounters:  08/31/23 128/68  04/28/23 138/60  04/05/23 134/68   Wt Readings from Last 3 Encounters:  08/31/23 156 lb 1.6 oz (70.8 kg)  04/28/23 152 lb 9.6 oz (69.2 kg)  04/05/23 155 lb 6.4 oz (70.5 kg)      Physical Exam Vitals reviewed.  Constitutional:      General: He is not in acute distress.    Appearance: He is not ill-appearing.  HENT:     Nose:     Comments: Intranasal mucosa reveals some whitish patches bilaterally.  He has a few prominent vessels Kiesselbach plexus but no active bleeding.  No lesions noted otherwise.    Mouth/Throat:     Mouth: Mucous membranes are moist.     Pharynx: Oropharynx is clear. Posterior oropharyngeal erythema present. No oropharyngeal exudate.  Cardiovascular:     Rate and Rhythm: Normal rate and regular rhythm.  Pulmonary:     Effort: Pulmonary effort is normal.     Breath sounds: Normal breath sounds.  Musculoskeletal:     Cervical back: Neck supple.  Lymphadenopathy:     Cervical: No cervical adenopathy.   Neurological:     Mental Status: He is alert.      No results found for any visits on 08/31/23.    The ASCVD Risk score (Arnett DK, et al., 2019) failed to calculate for the following reasons:   The 2019 ASCVD risk score is only valid for ages 30 to 64    Assessment & Plan:   #1 intermittent epistaxis reportedly left naris.  No active bleeding or clotting noted at this time.  No concerning skin lesions in terms of tumors/ulcers.  #2 whitish patches intranasally bilaterally.  Query Candida related to chronic intranasal steroids.  We recommended leaving  off any nasal steroids at this time.  Start fluconazole 100 mg once daily for 7 days and set up 2-week follow-up to reassess  #3 hypertension.  Treated with benazepril .  Initial blood pressure slightly up but improved significant after rest.  Continue benazepril .  Continue home monitoring.  Continue low-sodium diet and regular exercise. Return in about 2 weeks (around 09/14/2023).    Glean Lamy, MD

## 2023-09-01 ENCOUNTER — Telehealth: Payer: Self-pay | Admitting: Family Medicine

## 2023-09-01 ENCOUNTER — Ambulatory Visit: Payer: Self-pay | Admitting: Family Medicine

## 2023-09-01 ENCOUNTER — Ambulatory Visit (INDEPENDENT_AMBULATORY_CARE_PROVIDER_SITE_OTHER)

## 2023-09-01 ENCOUNTER — Encounter: Payer: Self-pay | Admitting: Family Medicine

## 2023-09-01 VITALS — BP 138/64 | HR 69 | Ht 67.0 in | Wt 155.0 lb

## 2023-09-01 DIAGNOSIS — M19012 Primary osteoarthritis, left shoulder: Secondary | ICD-10-CM | POA: Diagnosis not present

## 2023-09-01 DIAGNOSIS — M542 Cervicalgia: Secondary | ICD-10-CM

## 2023-09-01 DIAGNOSIS — M47812 Spondylosis without myelopathy or radiculopathy, cervical region: Secondary | ICD-10-CM | POA: Diagnosis not present

## 2023-09-01 DIAGNOSIS — M25512 Pain in left shoulder: Secondary | ICD-10-CM | POA: Diagnosis not present

## 2023-09-01 DIAGNOSIS — M5030 Other cervical disc degeneration, unspecified cervical region: Secondary | ICD-10-CM | POA: Diagnosis not present

## 2023-09-01 MED ORDER — DONEPEZIL HCL 10 MG PO TABS: 10.0000 mg | ORAL_TABLET | Freq: Every day | ORAL | 2 refills | Status: AC

## 2023-09-01 NOTE — Patient Instructions (Signed)
 Trigger point injections today Good to see you! PT at [facility] See you again in 2 months

## 2023-09-01 NOTE — Telephone Encounter (Signed)
 Pt returning call , Pt would like a call back .

## 2023-09-01 NOTE — Telephone Encounter (Signed)
 I called pt. He would like rx to go to [facility] on file. I e-scribed.  Relayed it does not appear he has had prior neuropsych testing. He needed this info for form he needed to complete.

## 2023-09-01 NOTE — Assessment & Plan Note (Signed)
 Patient given injections today to try to help with some of the muscle tightness noted today.  Discussed icing regimen and home exercises.  Increase activity slowly.  I do feel that formal physical therapy and potentially dry needling could be helpful.  Follow-up again in 6 to 8 weeks.

## 2023-09-01 NOTE — Assessment & Plan Note (Signed)
 Arthritic changes noted.  Discussed icing regimen and home exercises.  Discussed formal physical therapy and will be referred accordingly.  X-ray shows severe arthritic changes left greater than right of the facet joints noted.  Do think that potential laboratory workup will be necessary otherwise.  Follow-up again in 6 to 8 weeks.

## 2023-09-01 NOTE — Telephone Encounter (Signed)
 LVM for pt to call office.  Needing to confirm which pharmacy he wants donepezil  10mg  prescription to go to. Also, I reviewed chart and it does not appear he has had previous Neuropsychology evaluation before.

## 2023-09-01 NOTE — Telephone Encounter (Signed)
 Pt called stating He doesn't  mind upping his Medication   (donepezil  (ARICEPT ) 5 MG tablet ) dosage from 5mg  to 10mg  .Also Pt  was referred to Neruopsychiatry and they are asking have Pt had an evaluation before . Pt was not sure and want to know .

## 2023-09-02 ENCOUNTER — Ambulatory Visit (INDEPENDENT_AMBULATORY_CARE_PROVIDER_SITE_OTHER): Payer: Medicare Other

## 2023-09-02 DIAGNOSIS — I495 Sick sinus syndrome: Secondary | ICD-10-CM | POA: Diagnosis not present

## 2023-09-02 NOTE — Telephone Encounter (Addendum)
 LVM Per Amy,NP please cancel June appointment and reschedule for Aug/sept so patient can complete Neuropsych consult appointment

## 2023-09-03 ENCOUNTER — Telehealth: Payer: Self-pay | Admitting: Family Medicine

## 2023-09-03 LAB — CUP PACEART REMOTE DEVICE CHECK
Battery Remaining Longevity: 122 mo
Battery Remaining Percentage: 95.5 %
Battery Voltage: 3.01 V
Brady Statistic AP VP Percent: 1 %
Brady Statistic AP VS Percent: 11 %
Brady Statistic AS VP Percent: 1 %
Brady Statistic AS VS Percent: 89 %
Brady Statistic RA Percent Paced: 10 %
Brady Statistic RV Percent Paced: 1 %
Date Time Interrogation Session: 20250515134755
Implantable Lead Connection Status: 753985
Implantable Lead Connection Status: 753985
Implantable Lead Implant Date: 20111010
Implantable Lead Implant Date: 20111010
Implantable Lead Location: 753859
Implantable Lead Location: 753860
Implantable Pulse Generator Implant Date: 20240814
Lead Channel Impedance Value: 280 Ohm
Lead Channel Impedance Value: 330 Ohm
Lead Channel Pacing Threshold Amplitude: 0.5 V
Lead Channel Pacing Threshold Amplitude: 1.25 V
Lead Channel Pacing Threshold Pulse Width: 0.4 ms
Lead Channel Pacing Threshold Pulse Width: 0.4 ms
Lead Channel Sensing Intrinsic Amplitude: 2.1 mV
Lead Channel Sensing Intrinsic Amplitude: 4.3 mV
Lead Channel Setting Pacing Amplitude: 2 V
Lead Channel Setting Pacing Amplitude: 2.5 V
Lead Channel Setting Pacing Pulse Width: 0.4 ms
Lead Channel Setting Sensing Sensitivity: 0.7 mV
Pulse Gen Model: 2272
Pulse Gen Serial Number: 8201207

## 2023-09-03 NOTE — Telephone Encounter (Signed)
 Patient called and said Dr. Felipe Horton was going to give him some exercises but he didn't know if he was going to send them to him. He says he is coming this way and could pick them up at the office.

## 2023-09-03 NOTE — Telephone Encounter (Signed)
 Noted.

## 2023-09-03 NOTE — Telephone Encounter (Signed)
 Pt returned call and was r/s for late Aug.

## 2023-09-05 ENCOUNTER — Ambulatory Visit: Payer: Self-pay | Admitting: Cardiology

## 2023-09-09 ENCOUNTER — Encounter: Payer: Self-pay | Admitting: Family Medicine

## 2023-09-14 ENCOUNTER — Ambulatory Visit (INDEPENDENT_AMBULATORY_CARE_PROVIDER_SITE_OTHER): Admitting: Family Medicine

## 2023-09-14 ENCOUNTER — Encounter: Payer: Self-pay | Admitting: Family Medicine

## 2023-09-14 VITALS — BP 142/64 | HR 56 | Temp 97.8°F | Wt 150.6 lb

## 2023-09-14 DIAGNOSIS — R040 Epistaxis: Secondary | ICD-10-CM | POA: Diagnosis not present

## 2023-09-14 DIAGNOSIS — E785 Hyperlipidemia, unspecified: Secondary | ICD-10-CM | POA: Diagnosis not present

## 2023-09-14 DIAGNOSIS — G3184 Mild cognitive impairment, so stated: Secondary | ICD-10-CM

## 2023-09-14 DIAGNOSIS — I10 Essential (primary) hypertension: Secondary | ICD-10-CM

## 2023-09-14 NOTE — Progress Notes (Signed)
 Established Patient Office Visit  Subjective   Patient ID: ONDRE SALVETTI, male    DOB: 1940/07/05  Age: 83 y.o. MRN: [id number]  Chief Complaint  Patient presents with   Medical Management of Chronic Issues    HPI   Mr Maalouf is seen for medical follow-up.  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  He also has history of some cognitive impairment and is followed by neurology.  Plans to see the neuropsychologist later this summer.  Currently on Aricept  10 mg daily.  Recently had some epistaxis.  Some chronic nasal congestion.  Had been taking fluticasone nasal fairly regularly.  No acute sinusitis symptoms.  Intranasal exam last visit revealed no active bleeding.  He did have some whitish discolored patches nasal mucosa bilaterally but no ulcerations and no masses.  He was advised to leave off fluticasone nasal.  He has not noted any increased nasal congestion since doing so.  No further nosebleeds. We did not see any intraoral thrush last visit but queried possible intranasal Candida.  Blood pressure treated with benazepril .  He monitors fairly regularly at home and consistent readings around 130s systolic.  No recent headaches or dizziness.  Stays active with pickleball.  Plays several days per week.  Past Medical History:  Diagnosis Date   Bladder tumor    [facility]cer [facility])    Cardiac pacemaker in situ 01/27/2010   by dr Rodolfo Clan for documented SND,  St Jude dual chamber   GERD (gastroesophageal reflux disease)    History of adenomatous polyp of colon    History of basal cell carcinoma (BCC) excision    forehead and nose area 2015, 2016   History of squamous cell carcinoma excision    excision bilateral temple area  2015, 2016, 2019   History of syncope 2005   2005  w/ recurrent ;  documented sinus node dysfunction,   s/p PPM 2011  (03-20-2022  pt stated many  yrs since last syncope)   History of transient ischemic attack (TIA)    Hyperlipidemia    Hypertension    Mild cognitive impairment    Nocturia    OA (osteoarthritis)    OSA on CPAP    followed by dr Albertina Hugger;   study in epic 06-23-2021 severe complex apnea syndrome,   pt stated usese cpap nightly   Presence of permanent cardiac pacemaker    RLS (restless legs syndrome)    Rotator cuff tear arthropathy, right    Seizure disorder [facility]) 2021   neurologist--- dr Janett Medin;   started 2021 nocturnal seizures;   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, not intractable , without status epilepticus  (03-20-2022  pt stated last seizure 01/ 2023)   Sinus node dysfunction [facility]) 2010   cardiologist--- dr Rodolfo Clan;  hx recurrent syncope;   04/ 2010  loop recorder implant w/ documented SND;   01-27-2010  s/p PPM dual chamber , St Jude   TIA (transient ischemic attack)    Wears glasses    Wears hearing aid in both ears    Past Surgical History:  Procedure Laterality Date   CARDIAC PACEMAKER PLACEMENT  01/27/2010   @[facility]  by dr Rodolfo Clan;   explant loop recorder /  PPM  St Jude dual chamber   COLONOSCOPY WITH PROPOFOL   08/07/2020   dr pyrle   ESOPHAGOGASTRODUODENOSCOPY (EGD) WITH PROPOFOL   02/05/2014  dr pyrtle   LOOP RECORDER IMPLANT  08/17/2008   @[facility]   by dr Rodolfo Clan;   after tilt table study normal;  for recurrent syncope   PPM GENERATOR CHANGEOUT N/A 12/02/2022   Procedure: PPM GENERATOR CHANGEOUT;  Surgeon: Verona Goodwill, MD;  Location: [facility] INVASIVE CV LAB;  Service: Cardiovascular;  Laterality: N/A;   TRANSURETHRAL RESECTION OF BLADDER TUMOR WITH MITOMYCIN -C N/A 03/23/2022   Procedure: TRANSURETHRAL RESECTION OF BLADDER TUMOR WITH POST OPERATIVE GEMCITABINE ;  Surgeon: Trent Frizzle, MD;  Location: [facility];  Service: Urology;  Laterality: N/A;  45 MINS   TRANSURETHRAL RESECTION OF BLADDER TUMOR WITH MITOMYCIN -C N/A 05/14/2022    Procedure: TRANSURETHRAL RESECTION OF BLADDER TUMOR WITH GEMCITABINE ;  Surgeon: Trent Frizzle, MD;  Location: [facility];  Service: Urology;  Laterality: N/A;  30 MINS    reports that he quit smoking about 43 years ago. His smoking use included cigarettes. He started smoking about 68 years ago. He has never used smokeless tobacco. He reports current alcohol use of about 14.0 standard drinks of alcohol per week. He reports current drug use. Frequency: 7.00 times per week. Drug: Marijuana. family history includes Alcohol abuse in his maternal aunt; Colitis in his father; Hypertension in his father and mother; Liver disease in his father; Other in his father; Stroke in his mother. No Known Allergies   Review of Systems  Constitutional:  Negative for fever, malaise/fatigue and weight loss.  HENT:         See HPI  Eyes:  Negative for blurred vision.  Respiratory:  Negative for shortness of breath.   Cardiovascular:  Negative for chest pain.  Neurological:  Negative for dizziness, weakness and headaches.      Objective:     BP (!) 154/66 (BP Location: Left Arm, Patient Position: Sitting, Cuff Size: Normal)   Pulse (!) 56   Temp 97.8 F (36.6 C) (Oral)   Wt 150 lb 9.6 oz (68.3 kg)   SpO2 98%   BMI 23.59 kg/m  BP Readings from Last 3 Encounters:  09/14/23 (!) 154/66  09/01/23 138/64  08/31/23 128/68   Wt Readings from Last 3 Encounters:  09/14/23 150 lb 9.6 oz (68.3 kg)  09/01/23 155 lb (70.3 kg)  08/31/23 156 lb 1.6 oz (70.8 kg)      Physical Exam Vitals reviewed.  Constitutional:      General: He is not in acute distress.    Appearance: He is not ill-appearing.  HENT:     Nose:     Comments: Mucosa slightly dry.  Slightly small prominent vessel Kiesselbach plexus left naris but no clotting or active bleeding.  No whitish mucosal patches noted on the left side.  Right side couple very small whitish patches.  No ulceration.  No mass.  No bleeding    Mouth/Throat:     Mouth:  Mucous membranes are moist.     Pharynx: Oropharynx is clear. No oropharyngeal exudate or posterior oropharyngeal erythema.  Cardiovascular:     Rate and Rhythm: Normal rate and regular rhythm.  Pulmonary:     Effort: Pulmonary effort is normal.     Breath sounds: Normal breath sounds. No wheezing or rales.  Neurological:     Mental Status: He is alert.      No results found for any visits on 09/14/23.  Last CBC Lab Results  Component Value Date   WBC 6.6 11/25/2022   HGB 13.7 11/25/2022   HCT 42.2 11/25/2022   MCV 94 11/25/2022  MCH 30.4 11/25/2022   RDW 14.8 11/25/2022   PLT 168 11/25/2022   Last metabolic panel Lab Results  Component Value Date   GLUCOSE 85 11/25/2022   NA 139 11/25/2022   K 4.5 11/25/2022   CL 100 11/25/2022   CO2 25 11/25/2022   BUN 20 11/25/2022   CREATININE 0.81 11/25/2022   EGFR 78.0 12/24/2022   CALCIUM  9.2 11/25/2022   PROT 6.5 07/13/2019   ALBUMIN 4.2 07/13/2019   BILITOT 0.6 07/13/2019   ALKPHOS 52 07/13/2019   AST 23 07/13/2019   ALT 20 07/13/2019   ANIONGAP 8 05/07/2022   Last lipids Lab Results  Component Value Date   CHOL 171 09/10/2017   HDL 64 09/10/2017   LDLCALC 94 09/10/2017   LDLDIRECT 146.0 07/01/2012   TRIG 63 09/10/2017   CHOLHDL 4 12/22/2016   Last hemoglobin A1c No results found for: "HGBA1C" Last thyroid  functions Lab Results  Component Value Date   TSH 1.93 12/24/2022      The ASCVD Risk score (Arnett DK, et al., 2019) failed to calculate for the following reasons:   The 2019 ASCVD risk score is only valid for ages 21 to 78    Assessment & Plan:   #1 recent epistaxis-stable off Flonase.  He had some whitish discolored mucosal especially right naris possibly related to chronic Flonase use.  Recommend continued discontinuation of Flonase for now.  If allergy symptoms not controlled with oral antihistamine could look at other options such as Astelin nasal  #2 hypertension.  Up again slightly today but  did improve again somewhat after rest.  Home readings have consistently been well-controlled with 130 systolic.  #3 hyperlipidemia treated with rosuvastatin  low dosage.  He gets regular labs through the [place] and will be due for these in September.  #4 cognitive impairment.  Followed by neurology.  Pending follow-up with neuropsychologist.  Currently on Aricept  10 mg daily.  Discussed importance of staying engaged with regular exercise and also activities that challenge his memory such as games, puzzles, etc.  Glean Lamy, MD

## 2023-09-14 NOTE — Patient Instructions (Signed)
 Continue to leave off the Flonase for now.  Follow up for any recurrent nose bleeds  Monitor blood pressure and be in touch if consistently > 140 systolic (top number).

## 2023-09-20 ENCOUNTER — Ambulatory Visit: Admitting: Physical Therapy

## 2023-09-20 DIAGNOSIS — M542 Cervicalgia: Secondary | ICD-10-CM

## 2023-09-20 NOTE — Therapy (Unsigned)
 OUTPATIENT PHYSICAL THERAPY UPPER EXTREMITY EVALUATION   Patient Name: [name] [name] MRN: [id number] DOB:[birth year], 83 y.o., male Today's Date: 09/20/2023  END OF SESSION:   Past Medical History:  Diagnosis Date   Bladder tumor    Cancer [facility])    Cardiac pacemaker in situ 01/27/2010   by dr Rodolfo Clan for documented SND,  St Jude dual chamber   GERD (gastroesophageal reflux disease)    History of adenomatous polyp of colon    History of basal cell carcinoma (BCC) excision    forehead and nose area 2015, 2016   History of squamous cell carcinoma excision    excision bilateral temple area  2015, 2016, 2019   History of syncope 2005   2005  w/ recurrent ;  documented sinus node dysfunction,   s/p PPM 2011  (03-20-2022  pt stated many yrs since last syncope)   History of transient ischemic attack (TIA)    Hyperlipidemia    Hypertension    Mild cognitive impairment    Nocturia    OA (osteoarthritis)    OSA on CPAP    followed by dr Albertina Hugger;   study in epic 06-23-2021 severe complex apnea syndrome,   pt stated usese cpap nightly   Presence of permanent cardiac pacemaker    RLS (restless legs syndrome)    Rotator cuff tear arthropathy, right    Seizure disorder [facility]) 2021   neurologist--- dr Janett Medin;   started 2021 nocturnal seizures;   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, not intractable , without status epilepticus  (03-20-2022  pt stated last seizure 01/ 2023)   Sinus node dysfunction [facility]) 2010   cardiologist--- dr Rodolfo Clan;  hx recurrent syncope;   04/ 2010  loop recorder implant w/ documented SND;   01-27-2010  s/p PPM dual chamber , St Jude   TIA (transient ischemic attack)    Wears glasses    Wears hearing aid in both ears    Past Surgical History:  Procedure Laterality Date   CARDIAC PACEMAKER PLACEMENT  01/27/2010   @[facility]  by dr Rodolfo Clan;   explant loop recorder /  PPM  St Jude dual chamber   COLONOSCOPY WITH PROPOFOL    08/07/2020   dr pyrle   ESOPHAGOGASTRODUODENOSCOPY (EGD) WITH PROPOFOL   02/05/2014   dr pyrtle   LOOP RECORDER IMPLANT  08/17/2008   @[facility]   by dr Rodolfo Clan;   after tilt table study normal;  for recurrent syncope   PPM GENERATOR CHANGEOUT N/A 12/02/2022   Procedure: PPM GENERATOR CHANGEOUT;  Surgeon: Verona Goodwill, MD;  Location: [facility] INVASIVE CV LAB;  Service: Cardiovascular;  Laterality: N/A;   TRANSURETHRAL RESECTION OF BLADDER TUMOR WITH MITOMYCIN -C N/A 03/23/2022   Procedure: TRANSURETHRAL RESECTION OF BLADDER TUMOR WITH POST OPERATIVE GEMCITABINE ;  Surgeon: Trent Frizzle, MD;  Location: [facility];  Service: Urology;  Laterality: N/A;  45 MINS   TRANSURETHRAL RESECTION OF BLADDER TUMOR WITH MITOMYCIN -C N/A 05/14/2022   Procedure: TRANSURETHRAL RESECTION OF BLADDER TUMOR WITH GEMCITABINE ;  Surgeon: Trent Frizzle, MD;  Location: [facility];  Service: Urology;  Laterality: N/A;  30 MINS   Patient Active Problem List   Diagnosis Date Noted   Degenerative cervical disc 09/01/2023   Trigger point of left shoulder region 09/01/2023   Bladder cancer (HCC) 04/28/2023   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 10/13/2021   Complex sleep apnea syndrome 07/09/2021   Insomnia 07/09/2021   PLMD (periodic limb movement disorder) 07/09/2021   TIA (transient ischemic attack) 06/24/2021  Nocturnal seizure (HCC) 06/24/2021   Obstructive sleep apnea syndrome 06/24/2021   Degenerative joint disease of knee, left 12/10/2020   Right rotator cuff tear arthropathy 08/11/2019   Degenerative arthritis of right knee 07/13/2019   Acute medial meniscal tear, right, initial encounter 07/13/2019   Baker's cyst, right 07/13/2019   GERD (gastroesophageal reflux disease) 01/06/2015   Cerumen impaction 07/31/2014   Abdominal pain, chronic, epigastric 04/17/2014   Arthritis 04/17/2014   Essential hypertension, benign 04/17/2014   History of adenomatous polyp of colon 08/11/2011   Sinus node dysfunction  (HCC) 01/27/2011   CHEST WALL PAIN, ACUTE 10/11/2009   PACEMAKER-St.Jude 12/18/2008   SYNCOPE 07/24/2008   Hyperlipidemia 07/17/2008   Essential hypertension 07/17/2008    PCP: ***  REFERRING PROVIDER: ***  REFERRING DIAG: ***  THERAPY DIAG:  No diagnosis found.  Rationale for Evaluation and Treatment: Rehabilitation  ONSET DATE: ***  SUBJECTIVE:                                                                                                                                                                                      SUBJECTIVE STATEMENT: Neck pain, ongoing, but worsening lately.  On L side. He is R handed. Used to get massages, but hasn't been lately.  L UT, levator, into rhomboid none on R.   R  shoulder pain    Hand dominance: Right   PERTINENT HISTORY: ***  PAIN:  Are you having pain? Yes: NPRS scale: 7 /10 Pain location: *** Pain description: *** Aggravating factors: *** Relieving factors: ***  PRECAUTIONS: {Therapy precautions:24002}  RED FLAGS: None   WEIGHT BEARING RESTRICTIONS: No  FALLS:  Has patient fallen in last 6 months? {fallsyesno:27318}   PLOF: Independent  PATIENT GOALS: Decreased pain  NEXT MD VISIT:   OBJECTIVE:   DIAGNOSTIC FINDINGS:    PATIENT SURVEYS :    COGNITION: Overall cognitive status: Within functional limits for tasks assessed     SENSATION: WFL  POSTURE:   UPPER EXTREMITY ROM:   Active  ROM Right eval Left eval  Shoulder flexion    Shoulder extension    Shoulder abduction    Shoulder adduction    Shoulder internal rotation    Shoulder external rotation    Elbow flexion    Elbow extension    L rotation  35  R rotation   55  Wrist ulnar deviation    Wrist radial deviation    Wrist pronation    Wrist supination    (Blank rows = not tested)  UPPER EXTREMITY MMT:  MMT Right eval Left eval  Shoulder flexion    Shoulder extension    Shoulder abduction  Shoulder adduction     Shoulder internal rotation    Shoulder external rotation    Middle trapezius    Lower trapezius    Elbow flexion    Elbow extension    Wrist flexion    Wrist extension    Wrist ulnar deviation    Wrist radial deviation    Wrist pronation    Wrist supination    Grip strength (lbs)    (Blank rows = not tested)  SHOULDER SPECIAL TESTS:   JOINT MOBILITY TESTING:  ***  PALPATION:  ***    TODAY'S TREATMENT:                                                                                                                                         DATE:  ***  PATIENT EDUCATION:  Education details: PT POC, Exam findings, HEP Person educated: Patient Education method: Explanation, Demonstration, Tactile cues, Verbal cues, and Handouts Education comprehension: verbalized understanding, returned demonstration, verbal cues required, tactile cues required, and needs further education   HOME EXERCISE PROGRAM: ***  ASSESSMENT:  CLINICAL IMPRESSION: Eval:  Patient presents with primary complaint of   Pt with decreased ability for full functional activities, reaching, lifting, carrying, and IADLs. Pt to benefit from skilled PT to improve deficits and pain.    OBJECTIVE IMPAIRMENTS: {opptimpairments:25111}.   ACTIVITY LIMITATIONS: {activitylimitations:27494}  PARTICIPATION LIMITATIONS: {participationrestrictions:25113}  PERSONAL FACTORS: {Personal factors:25162} are also affecting patient's functional outcome.   REHAB POTENTIAL: {rehabpotential:25112}  CLINICAL DECISION MAKING: {clinical decision making:25114}  EVALUATION COMPLEXITY: {Evaluation complexity:25115}  GOALS: Goals reviewed with patient? Yes   SHORT TERM GOALS: Target date: ***  Pt to be intendment with initial HEP  Goal status: INITIAL  2.  ***  Goal status: INITIAL    LONG TERM GOALS: Target date: ***  Pt to be independent with final HEP  Goal status: INITIAL  2.  Pt to demo improved ROM to  be [facility] and pain free, to improve ability for ADLs.   Goal status: INITIAL  3.  Pt to demo improved strength to be at least 4/5, to improve ability for ***  Goal status: INITIAL  4.  ***  Goal status: INITIAL    PLAN: PT FREQUENCY: {rehab frequency:25116}  PT DURATION: {rehab duration:25117}  PLANNED INTERVENTIONS: Therapeutic exercises, Therapeutic activity, Neuromuscular re-education, Patient/Family education, Self Care, Joint mobilization, Joint manipulation, Stair training, DME instructions, Aquatic Therapy, Dry Needling, Electrical stimulation, Cryotherapy, Moist heat, Taping, Ultrasound, Ionotophoresis 4mg /ml Dexamethasone , Manual therapy,  Vasopneumatic device, Traction, Spinal manipulation, Spinal mobilization,    PLAN FOR NEXT SESSION:    Terrilee Few, PT 09/20/2023, 3:28 PM

## 2023-09-21 DIAGNOSIS — G4733 Obstructive sleep apnea (adult) (pediatric): Secondary | ICD-10-CM | POA: Diagnosis not present

## 2023-09-22 ENCOUNTER — Ambulatory Visit: Admitting: Physical Therapy

## 2023-09-22 DIAGNOSIS — M542 Cervicalgia: Secondary | ICD-10-CM | POA: Diagnosis not present

## 2023-09-24 ENCOUNTER — Encounter: Payer: Self-pay | Admitting: Physical Therapy

## 2023-09-24 NOTE — Therapy (Signed)
 OUTPATIENT PHYSICAL THERAPY UPPER EXTREMITY TREATMENT   Patient Name: [name] [name] MRN: [id number] DOB:[birth year], 83 y.o., male Today's Date: 09/22/2023  END OF SESSION:  PT End of Session - 09/24/23 1149     Visit Number 2    Number of Visits 16    Date for PT Re-Evaluation 11/15/23    Authorization Type HTA    PT Start Time 0933    PT Stop Time 1015    PT Time Calculation (min) 42 min    Activity Tolerance Patient tolerated treatment well    Behavior During Therapy [facility] for tasks assessed/performed             Past Medical History:  Diagnosis Date   Bladder tumor    Cancer (HCC)    Cardiac pacemaker in situ 01/27/2010   by dr Rodolfo Clan for documented SND,  St Jude dual chamber   GERD (gastroesophageal reflux disease)    History of adenomatous polyp of colon    History of basal cell carcinoma (BCC) excision    forehead and nose area 2015, 2016   History of squamous cell carcinoma excision    excision bilateral temple area  2015, 2016, 2019   History of syncope 2005   2005  w/ recurrent ;  documented sinus node dysfunction,   s/p PPM 2011  (03-20-2022  pt stated many yrs since last syncope)   History of transient ischemic attack (TIA)    Hyperlipidemia    Hypertension    Mild cognitive impairment    Nocturia    OA (osteoarthritis)    OSA on CPAP    followed by dr Albertina Hugger;   study in epic 06-23-2021 severe complex apnea syndrome,   pt stated usese cpap nightly   Presence of permanent cardiac pacemaker    RLS (restless legs syndrome)    Rotator cuff tear arthropathy, right    Seizure disorder [facility]) 2021   neurologist--- dr Janett Medin;   started 2021 nocturnal seizures;   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, not intractable , without status epilepticus  (03-20-2022  pt stated last seizure 01/ 2023)   Sinus node dysfunction [facility]) 2010   cardiologist--- dr Rodolfo Clan;  hx recurrent syncope;   04/ 2010  loop recorder  implant w/ documented SND;   01-27-2010  s/p PPM dual chamber , St Jude   TIA (transient ischemic attack)    Wears glasses    Wears hearing aid in both ears    Past Surgical History:  Procedure Laterality Date   CARDIAC PACEMAKER PLACEMENT  01/27/2010   @[facility]  by dr Rodolfo Clan;   explant loop recorder /  PPM  St Jude dual chamber   COLONOSCOPY WITH PROPOFOL   08/07/2020   dr pyrle   ESOPHAGOGASTRODUODENOSCOPY (EGD) WITH PROPOFOL   02/05/2014   dr pyrtle   LOOP RECORDER IMPLANT  08/17/2008   @[facility]   by dr Rodolfo Clan;   after tilt table study normal;  for recurrent syncope   PPM GENERATOR CHANGEOUT N/A 12/02/2022   Procedure: PPM GENERATOR CHANGEOUT;  Surgeon: Verona Goodwill, MD;  Location: [facility] INVASIVE CV LAB;  Service: Cardiovascular;  Laterality: N/A;   TRANSURETHRAL RESECTION OF BLADDER TUMOR WITH MITOMYCIN -C N/A 03/23/2022   Procedure: TRANSURETHRAL RESECTION OF BLADDER TUMOR WITH POST OPERATIVE GEMCITABINE ;  Surgeon: Trent Frizzle, MD;  Location: [facility];  Service: Urology;  Laterality: N/A;  45 MINS   TRANSURETHRAL RESECTION OF BLADDER TUMOR WITH MITOMYCIN -C N/A 05/14/2022   Procedure: TRANSURETHRAL RESECTION  OF BLADDER TUMOR WITH GEMCITABINE ;  Surgeon: Trent Frizzle, MD;  Location: [facility];  Service: Urology;  Laterality: N/A;  30 MINS   Patient Active Problem List   Diagnosis Date Noted   Degenerative cervical disc 09/01/2023   Trigger point of left shoulder region 09/01/2023   Bladder cancer (HCC) 04/28/2023   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 10/13/2021   Complex sleep apnea syndrome 07/09/2021   Insomnia 07/09/2021   PLMD (periodic limb movement disorder) 07/09/2021   TIA (transient ischemic attack) 06/24/2021   Nocturnal seizure (HCC) 06/24/2021   Obstructive sleep apnea syndrome 06/24/2021   Degenerative joint disease of knee, left 12/10/2020   Right rotator cuff tear arthropathy 08/11/2019   Degenerative arthritis of right knee 07/13/2019   Acute medial meniscal  tear, right, initial encounter 07/13/2019   Baker's cyst, right 07/13/2019   GERD (gastroesophageal reflux disease) 01/06/2015   Cerumen impaction 07/31/2014   Abdominal pain, chronic, epigastric 04/17/2014   Arthritis 04/17/2014   Essential hypertension, benign 04/17/2014   History of adenomatous polyp of colon 08/11/2011   Sinus node dysfunction (HCC) 01/27/2011   CHEST WALL PAIN, ACUTE 10/11/2009   PACEMAKER-St.Jude 12/18/2008   SYNCOPE 07/24/2008   Hyperlipidemia 07/17/2008   Essential hypertension 07/17/2008    PCP: Glean Lamy  REFERRING PROVIDER: Napolean Backbone   REFERRING DIAG: Neck Pain   THERAPY DIAG:  Cervicalgia  Rationale for Evaluation and Treatment: Rehabilitation  ONSET DATE:   SUBJECTIVE:                                                                                                                                                                                      SUBJECTIVE STATEMENT: 6/4: pt states ongoing pain, mostly in side of Neck on L.    Eval: Pt states Neck pain, ongoing, but worsening lately.  Pain On L side. He is R handed. Used to get massages which helped, but hasn't been lately.  States pain in region of : L UT, levator, into rhomboid,   none on R.   Has some R  shoulder pain   Hand dominance: Right   PERTINENT HISTORY: CA, bladder tumor, TIA, Pacemaker, HTN, seizure disorder, OA,   PAIN:  Are you having pain? Yes: NPRS scale: 7 /10 Pain location: L side of neck Pain description: sore, pinching  Aggravating factors: rotation and neck movement  Relieving factors: none stated   PRECAUTIONS: ICD/Pacemaker **  RED FLAGS: None   WEIGHT BEARING RESTRICTIONS: No  FALLS:  Has patient fallen in last 6 months? No   PLOF: Independent  PATIENT GOALS: Decreased pain in neck, improve movement   NEXT MD VISIT:   OBJECTIVE:  DIAGNOSTIC FINDINGS:    PATIENT SURVEYS :    COGNITION: Overall cognitive status: Within  functional limits for tasks assessed     SENSATION: WFL  POSTURE: fwd head, kyphosis, stiff upper body and neck posture.    UPPER EXTREMITY ROM:   Active  ROM Right eval Left eval  Shoulder flexion    Shoulder extension    Shoulder abduction    Shoulder adduction    Shoulder internal rotation    Shoulder external rotation        Neck  flexion  Mild limitation  Neck extension  Mod/significant limitation  L rotation  35  R rotation   55                  (Blank rows = not tested)  UPPER EXTREMITY MMT:  MMT Right eval Left eval  Shoulder flexion 4 4  Shoulder extension    Shoulder abduction 4 4  Shoulder adduction    Shoulder internal rotation    Shoulder external rotation    Middle trapezius    Lower trapezius    Elbow flexion    Elbow extension    Wrist flexion    Wrist extension    Wrist ulnar deviation    Wrist radial deviation    Wrist pronation    Wrist supination    Grip strength (lbs)    (Blank rows = not tested)  SHOULDER SPECIAL TESTS:   JOINT MOBILITY TESTING:  Very stiff c-spine and t-spine   PALPATION:  Pain :  L UT, levator,   pain in L mid-c-spine with L rotation.     TODAY'S TREATMENT:                                                                                                                                         DATE:    09/22/23: Therapeutic Exercise: Aerobic:  Supine: prom for cervical rotation and SB Seated: Cervical flexion x 5, Rotation x 10 bil;  light overpressure to L x 10;  Education and practice for optimal seated posture with more neutral head and t-spine position. Standing:  bwd shoulder rolls x 15;  Scap squeeze x 15;  Stretches:  Neuromuscular Re-education:   Manual Therapy:  STM to L paraspinals, L UT, levator,  , light cervical PA s; manual UT stretching  Therapeutic Activity: Self Care:   Eval: manual: STM to L UT, levator, and paraspinals,  prom for rotation and SB.   PATIENT EDUCATION:  Education  details: education on initial HEP, seated posture for pain.  Person educated: Patient Education method: Explanation, Demonstration, Tactile cues, Verbal cues, and Handouts Education comprehension: verbalized understanding, returned demonstration, verbal cues required, tactile cues required, and needs further education   HOME EXERCISE PROGRAM: Access Code: [email or web address] URL: [email or web address] Date: 09/24/2023 Prepared by: Terrilee Few  Exercises - Standing Backward Shoulder Rolls  - 2 x daily -  1 sets - 10 reps - Standing Scapular Retraction  - 2 x daily - 1 sets - 10 reps - Seated Cervical Rotation AROM  - 2 x daily - 1 sets - 10 reps  ASSESSMENT:  CLINICAL IMPRESSION: 6/4:  Pt with good tolerance for posture training and manual today. Encouraged head movement throughout the day to improve stiffness. He has most pain in lateral/mid c-spine with L rotation. Improved rotation rom after session today, but still with pinching at end of available range. Pt to benefit from continued manual as needed.   Eval: Patient presents with primary complaint of pain in L side of neck and cervical muscualture. Pt with much stiffness in c-spine and t-spine, with lack of range of motion. He has increased pain in L UT, levator region, but most pain in L mid/lateral c-spine, with L rotation. He has poor posture, with stiff/fwd head and kyphosis. He has lack of effective HEP for his ongoing pain.  Pt with decreased ability for full functional activities, driving, turing, exercise and  IADLs. Pt to benefit from skilled PT to improve deficits and pain.    OBJECTIVE IMPAIRMENTS: decreased activity tolerance, decreased mobility, decreased ROM, decreased strength, hypomobility, increased muscle spasms, impaired flexibility, postural dysfunction, and pain.   ACTIVITY LIMITATIONS: bending, hygiene/grooming, and locomotion level  PARTICIPATION LIMITATIONS: meal prep, cleaning, driving, shopping, and  community activity  PERSONAL FACTORS: Past/current experiences and 1-2 comorbidities: stiffness, posture, OA are also affecting patient's functional outcome.   REHAB POTENTIAL: Good  CLINICAL DECISION MAKING: Stable/uncomplicated  EVALUATION COMPLEXITY: Low  GOALS: Goals reviewed with patient? Yes   SHORT TERM GOALS: Target date: 10/04/23  Pt to be intendment with initial HEP  Goal status: INITIAL  2.  Pt to demo improved L rotation by at least 5 deg.   Goal status: INITIAL    LONG TERM GOALS: Target date: 11/15/23  Pt to be independent with final HEP  Goal status: INITIAL  2.  Pt to demo improved L cervical rotation, to be equal to R,  and pain free, to improve ability for ADLs.   Goal status: INITIAL  3.  Pt to report decreased pain in neck to 0-2/10 with activity and driving.   Goal status: INITIAL    PLAN: PT FREQUENCY: 1-2x/week  PT DURATION: 8 weeks  PLANNED INTERVENTIONS: Therapeutic exercises, Therapeutic activity, Neuromuscular re-education, Patient/Family education, Self Care, Joint mobilization, Joint manipulation, Stair training, DME instructions, Aquatic Therapy, Dry Needling, Electrical stimulation, Cryotherapy, Moist heat, Taping, Ultrasound, Ionotophoresis 4mg /ml Dexamethasone , Manual therapy,  Vasopneumatic device, Traction, Spinal manipulation, Spinal mobilization,    PLAN FOR NEXT SESSION:  manual, postural education, postural mobility and strength. Pain with L rotation ( on L)    Terrilee Few, PT, DPT 11:49 AM  09/24/23

## 2023-09-28 ENCOUNTER — Encounter: Payer: Self-pay | Admitting: Physical Therapy

## 2023-09-28 ENCOUNTER — Ambulatory Visit: Admitting: Physical Therapy

## 2023-09-28 DIAGNOSIS — M542 Cervicalgia: Secondary | ICD-10-CM

## 2023-09-28 NOTE — Therapy (Signed)
 OUTPATIENT PHYSICAL THERAPY UPPER EXTREMITY TREATMENT   Patient Name: [name] [name] MRN: [id number] DOB:July 02, 1940, 83 y.o., male Today's Date: 09/28/2023  END OF SESSION:  PT End of Session - 09/28/23 1604     Visit Number 3    Number of Visits 16    Date for PT Re-Evaluation 11/15/23    Authorization Type HTA    PT Start Time 1605    PT Stop Time 1645    PT Time Calculation (min) 40 min    Activity Tolerance Patient tolerated treatment well    Behavior During Therapy [facility] for tasks assessed/performed             Past Medical History:  Diagnosis Date   Bladder tumor    Cancer (HCC)    Cardiac pacemaker in situ 01/27/2010   by dr Rodolfo Clan for documented SND,  St Jude dual chamber   GERD (gastroesophageal reflux disease)    History of adenomatous polyp of colon    History of basal cell carcinoma (BCC) excision    forehead and nose area 2015, 2016   History of squamous cell carcinoma excision    excision bilateral temple area  2015, 2016, 2019   History of syncope 2005   2005  w/ recurrent ;  documented sinus node dysfunction,   s/p PPM 2011  (03-20-2022  pt stated many yrs since last syncope)   History of transient ischemic attack (TIA)    Hyperlipidemia    Hypertension    Mild cognitive impairment    Nocturia    OA (osteoarthritis)    OSA on CPAP    followed by dr Albertina Hugger;   study in epic 06-23-2021 severe complex apnea syndrome,   pt stated usese cpap nightly   Presence of permanent cardiac pacemaker    RLS (restless legs syndrome)    Rotator cuff tear arthropathy, right    Seizure disorder [facility]) 2021   neurologist--- dr Janett Medin;   started 2021 nocturnal seizures;   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, not intractable , without status epilepticus  (03-20-2022  pt stated last seizure 01/ 2023)   Sinus node dysfunction [facility]) 2010   cardiologist--- dr Rodolfo Clan;  hx recurrent syncope;   04/ 2010  loop recorder  implant w/ documented SND;   01-27-2010  s/p PPM dual chamber , St Jude   TIA (transient ischemic attack)    Wears glasses    Wears hearing aid in both ears    Past Surgical History:  Procedure Laterality Date   CARDIAC PACEMAKER PLACEMENT  01/27/2010   @[facility]  by dr Rodolfo Clan;   explant loop recorder /  PPM  St Jude dual chamber   COLONOSCOPY WITH PROPOFOL   08/07/2020   dr pyrle   ESOPHAGOGASTRODUODENOSCOPY (EGD) WITH PROPOFOL   02/05/2014   dr pyrtle   LOOP RECORDER IMPLANT  08/17/2008   @[facility]   by dr Rodolfo Clan;   after tilt table study normal;  for recurrent syncope   PPM GENERATOR CHANGEOUT N/A 12/02/2022   Procedure: PPM GENERATOR CHANGEOUT;  Surgeon: Verona Goodwill, MD;  Location: [facility] INVASIVE CV LAB;  Service: Cardiovascular;  Laterality: N/A;   TRANSURETHRAL RESECTION OF BLADDER TUMOR WITH MITOMYCIN -C N/A 03/23/2022   Procedure: TRANSURETHRAL RESECTION OF BLADDER TUMOR WITH POST OPERATIVE GEMCITABINE ;  Surgeon: Trent Frizzle, MD;  Location: [facility];  Service: Urology;  Laterality: N/A;  45 MINS   TRANSURETHRAL RESECTION OF BLADDER TUMOR WITH MITOMYCIN -C N/A 05/14/2022   Procedure: TRANSURETHRAL RESECTION  OF BLADDER TUMOR WITH GEMCITABINE ;  Surgeon: Trent Frizzle, MD;  Location: [facility];  Service: Urology;  Laterality: N/A;  30 MINS   Patient Active Problem List   Diagnosis Date Noted   Degenerative cervical disc 09/01/2023   Trigger point of left shoulder region 09/01/2023   Bladder cancer (HCC) 04/28/2023   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 10/13/2021   Complex sleep apnea syndrome 07/09/2021   Insomnia 07/09/2021   PLMD (periodic limb movement disorder) 07/09/2021   TIA (transient ischemic attack) 06/24/2021   Nocturnal seizure (HCC) 06/24/2021   Obstructive sleep apnea syndrome 06/24/2021   Degenerative joint disease of knee, left 12/10/2020   Right rotator cuff tear arthropathy 08/11/2019   Degenerative arthritis of right knee 07/13/2019   Acute medial meniscal  tear, right, initial encounter 07/13/2019   Baker's cyst, right 07/13/2019   GERD (gastroesophageal reflux disease) 01/06/2015   Cerumen impaction 07/31/2014   Abdominal pain, chronic, epigastric 04/17/2014   Arthritis 04/17/2014   Essential hypertension, benign 04/17/2014   History of adenomatous polyp of colon 08/11/2011   Sinus node dysfunction (HCC) 01/27/2011   CHEST WALL PAIN, ACUTE 10/11/2009   PACEMAKER-St.Jude 12/18/2008   SYNCOPE 07/24/2008   Hyperlipidemia 07/17/2008   Essential hypertension 07/17/2008    PCP: Glean Lamy  REFERRING PROVIDER: Napolean Backbone   REFERRING DIAG: Neck Pain   THERAPY DIAG:  Cervicalgia  Rationale for Evaluation and Treatment: Rehabilitation  ONSET DATE:   SUBJECTIVE:                                                                                                                                                                                      SUBJECTIVE STATEMENT: 6/10: pt states ongoing pain, on L side of Neck with L rotation.    Eval: Pt states Neck pain, ongoing, but worsening lately.  Pain On L side. He is R handed. Used to get massages which helped, but hasn't been lately.  States pain in region of : L UT, levator, into rhomboid,   none on R.   Has some R  shoulder pain   Hand dominance: Right   PERTINENT HISTORY: CA, bladder tumor, TIA, Pacemaker, HTN, seizure disorder, OA,   PAIN:  Are you having pain? Yes: NPRS scale: 7 /10 Pain location: L side of neck Pain description: sore, pinching  Aggravating factors: rotation and neck movement  Relieving factors: none stated   PRECAUTIONS: ICD/Pacemaker **  RED FLAGS: None   WEIGHT BEARING RESTRICTIONS: No  FALLS:  Has patient fallen in last 6 months? No   PLOF: Independent  PATIENT GOALS: Decreased pain in neck, improve movement   NEXT MD VISIT:   OBJECTIVE:  DIAGNOSTIC FINDINGS:    PATIENT SURVEYS :    COGNITION: Overall cognitive status: Within  functional limits for tasks assessed     SENSATION: WFL  POSTURE: fwd head, kyphosis, stiff upper body and neck posture.    UPPER EXTREMITY ROM:   Active  ROM Right eval Left eval  Shoulder flexion    Shoulder extension    Shoulder abduction    Shoulder adduction    Shoulder internal rotation    Shoulder external rotation        Neck  flexion  Mild limitation  Neck extension  Mod/significant limitation  L rotation  35  R rotation   55                  (Blank rows = not tested)  UPPER EXTREMITY MMT:  MMT Right eval Left eval  Shoulder flexion 4 4  Shoulder extension    Shoulder abduction 4 4  Shoulder adduction    Shoulder internal rotation    Shoulder external rotation    Middle trapezius    Lower trapezius    Elbow flexion    Elbow extension    Wrist flexion    Wrist extension    Wrist ulnar deviation    Wrist radial deviation    Wrist pronation    Wrist supination    Grip strength (lbs)    (Blank rows = not tested)  SHOULDER SPECIAL TESTS:   JOINT MOBILITY TESTING:  Very stiff c-spine and t-spine   PALPATION:  Pain :  L UT, levator,   pain in L mid-c-spine with L rotation.     TODAY'S TREATMENT:                                                                                                                                         DATE:    09/22/23: Therapeutic Exercise: Aerobic:  Supine: prom for cervical rotation and SB Seated: Cervical flexion x 5, Rotation x 10 bil;  light overpressure to L x 10;  Education and practice for optimal seated posture with more neutral head and t-spine position. Standing:  bwd shoulder rolls x 15;  Scap squeeze x 15;  Stretches:  Neuromuscular Re-education:   Manual Therapy:  STM to L paraspinals, L UT, levator,  light cervical PA s; manual UT stretching , light cervical distraction  Therapeutic Activity: Self Care:   Eval: manual: STM to L UT, levator, and paraspinals,  prom for rotation and SB.    PATIENT EDUCATION:  Education details: education on initial HEP, seated posture for pain.  Person educated: Patient Education method: Explanation, Demonstration, Tactile cues, Verbal cues, and Handouts Education comprehension: verbalized understanding, returned demonstration, verbal cues required, tactile cues required, and needs further education   HOME EXERCISE PROGRAM: Access Code: [email or web address] URL: [email or web address] Date: 09/24/2023 Prepared by: Terrilee Few  Exercises - Standing Backward Shoulder Rolls  - 2  x daily - 1 sets - 10 reps - Standing Scapular Retraction  - 2 x daily - 1 sets - 10 reps - Seated Cervical Rotation AROM  - 2 x daily - 1 sets - 10 reps  ASSESSMENT:  CLINICAL IMPRESSION:  Encouraged head movement throughout the day to improve stiffness. He has most pain in lateral/mid c-spine with L rotation at end range. He has minimal change after manual and session today in pain. He has improving awareness of posture and head/shoulder position, but will benefit from continued education and work on this.   Eval: Patient presents with primary complaint of pain in L side of neck and cervical muscualture. Pt with much stiffness in c-spine and t-spine, with lack of range of motion. He has increased pain in L UT, levator region, but most pain in L mid/lateral c-spine, with L rotation. He has poor posture, with stiff/fwd head and kyphosis. He has lack of effective HEP for his ongoing pain.  Pt with decreased ability for full functional activities, driving, turing, exercise and  IADLs. Pt to benefit from skilled PT to improve deficits and pain.    OBJECTIVE IMPAIRMENTS: decreased activity tolerance, decreased mobility, decreased ROM, decreased strength, hypomobility, increased muscle spasms, impaired flexibility, postural dysfunction, and pain.   ACTIVITY LIMITATIONS: bending, hygiene/grooming, and locomotion level  PARTICIPATION LIMITATIONS: meal prep,  cleaning, driving, shopping, and community activity  PERSONAL FACTORS: Past/current experiences and 1-2 comorbidities: stiffness, posture, OA are also affecting patient's functional outcome.   REHAB POTENTIAL: Good  CLINICAL DECISION MAKING: Stable/uncomplicated  EVALUATION COMPLEXITY: Low  GOALS: Goals reviewed with patient? Yes   SHORT TERM GOALS: Target date: 10/04/23  Pt to be intendment with initial HEP  Goal status: INITIAL  2.  Pt to demo improved L rotation by at least 5 deg.   Goal status: INITIAL    LONG TERM GOALS: Target date: 11/15/23  Pt to be independent with final HEP  Goal status: INITIAL  2.  Pt to demo improved L cervical rotation, to be equal to R,  and pain free, to improve ability for ADLs.   Goal status: INITIAL  3.  Pt to report decreased pain in neck to 0-2/10 with activity and driving.   Goal status: INITIAL    PLAN: PT FREQUENCY: 1-2x/week  PT DURATION: 8 weeks  PLANNED INTERVENTIONS: Therapeutic exercises, Therapeutic activity, Neuromuscular re-education, Patient/Family education, Self Care, Joint mobilization, Joint manipulation, Stair training, DME instructions, Aquatic Therapy, Dry Needling, Electrical stimulation, Cryotherapy, Moist heat, Taping, Ultrasound, Ionotophoresis 4mg /ml Dexamethasone , Manual therapy,  Vasopneumatic device, Traction, Spinal manipulation, Spinal mobilization,    PLAN FOR NEXT SESSION:  manual, postural education, postural mobility and strength. Pain with L rotation ( on L)    Terrilee Few, PT, DPT 4:46 PM  09/28/23

## 2023-10-04 DIAGNOSIS — C44329 Squamous cell carcinoma of skin of other parts of face: Secondary | ICD-10-CM | POA: Diagnosis not present

## 2023-10-04 DIAGNOSIS — L905 Scar conditions and fibrosis of skin: Secondary | ICD-10-CM | POA: Diagnosis not present

## 2023-10-04 DIAGNOSIS — Z85828 Personal history of other malignant neoplasm of skin: Secondary | ICD-10-CM | POA: Diagnosis not present

## 2023-10-04 DIAGNOSIS — D485 Neoplasm of uncertain behavior of skin: Secondary | ICD-10-CM | POA: Diagnosis not present

## 2023-10-04 DIAGNOSIS — L570 Actinic keratosis: Secondary | ICD-10-CM | POA: Diagnosis not present

## 2023-10-04 DIAGNOSIS — L814 Other melanin hyperpigmentation: Secondary | ICD-10-CM | POA: Diagnosis not present

## 2023-10-04 DIAGNOSIS — L821 Other seborrheic keratosis: Secondary | ICD-10-CM | POA: Diagnosis not present

## 2023-10-05 ENCOUNTER — Ambulatory Visit: Admitting: Physical Therapy

## 2023-10-05 ENCOUNTER — Encounter: Payer: Self-pay | Admitting: Physical Therapy

## 2023-10-05 DIAGNOSIS — M542 Cervicalgia: Secondary | ICD-10-CM | POA: Diagnosis not present

## 2023-10-05 NOTE — Therapy (Signed)
 OUTPATIENT PHYSICAL THERAPY UPPER EXTREMITY TREATMENT   Patient Name: [name] [name] MRN: [id number] DOB:[birth year], 83 y.o., male Today's Date: 10/05/2023  END OF SESSION:  PT End of Session - 10/05/23 1634     Visit Number 4    Number of Visits 16    Date for PT Re-Evaluation 11/15/23    Authorization Type HTA    PT Start Time 1304    PT Stop Time 1345    PT Time Calculation (min) 41 min    Activity Tolerance Patient tolerated treatment well    Behavior During Therapy [facility] for tasks assessed/performed           Past Medical History:  Diagnosis Date   Bladder tumor    Cancer (HCC)    Cardiac pacemaker in situ 01/27/2010   by dr Rodolfo Clan for documented SND,  St Jude dual chamber   GERD (gastroesophageal reflux disease)    History of adenomatous polyp of colon    History of basal cell carcinoma (BCC) excision    forehead and nose area 2015, 2016   History of squamous cell carcinoma excision    excision bilateral temple area  2015, 2016, 2019   History of syncope 2005   2005  w/ recurrent ;  documented sinus node dysfunction,   s/p PPM 2011  (03-20-2022  pt stated many yrs since last syncope)   History of transient ischemic attack (TIA)    Hyperlipidemia    Hypertension    Mild cognitive impairment    Nocturia    OA (osteoarthritis)    OSA on CPAP    followed by dr Albertina Hugger;   study in epic 06-23-2021 severe complex apnea syndrome,   pt stated usese cpap nightly   Presence of permanent cardiac pacemaker    RLS (restless legs syndrome)    Rotator cuff tear arthropathy, right    Seizure disorder [facility]) 2021   neurologist--- dr Janett Medin;   started 2021 nocturnal seizures;   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) 2010   cardiologist--- dr Rodolfo Clan;  hx recurrent syncope;   04/ 2010  loop recorder  implant w/ documented SND;   01-27-2010  s/p PPM dual chamber , St Jude   TIA (transient ischemic attack)    Wears glasses    Wears hearing aid in both ears    Past Surgical History:  Procedure Laterality Date   CARDIAC PACEMAKER PLACEMENT  01/27/2010   @[facility]  by dr Rodolfo Clan;   explant loop recorder /  PPM  St Jude dual chamber   COLONOSCOPY WITH PROPOFOL   08/07/2020   dr pyrle   ESOPHAGOGASTRODUODENOSCOPY (EGD) WITH PROPOFOL   02/05/2014   dr pyrtle   LOOP RECORDER IMPLANT  08/17/2008   @[facility]   by dr Rodolfo Clan;   after tilt table study normal;  for recurrent syncope   PPM GENERATOR CHANGEOUT N/A 12/02/2022   Procedure: PPM GENERATOR CHANGEOUT;  Surgeon: Verona Goodwill, MD;  Location: [facility] INVASIVE CV LAB;  Service: Cardiovascular;  Laterality: N/A;   TRANSURETHRAL RESECTION OF BLADDER TUMOR WITH MITOMYCIN -C N/A 03/23/2022   Procedure: TRANSURETHRAL RESECTION OF BLADDER TUMOR WITH POST OPERATIVE GEMCITABINE ;  Surgeon: Trent Frizzle, MD;  Location: [facility];  Service: Urology;  Laterality: N/A;  45 MINS   TRANSURETHRAL RESECTION OF BLADDER TUMOR WITH MITOMYCIN -C N/A 05/14/2022   Procedure: TRANSURETHRAL RESECTION OF BLADDER  TUMOR WITH GEMCITABINE ;  Surgeon: Trent Frizzle, MD;  Location: [facility];  Service: Urology;  Laterality: N/A;  30 MINS   Patient Active Problem List   Diagnosis Date Noted   Degenerative cervical disc 09/01/2023   Trigger point of left shoulder region 09/01/2023   Bladder cancer (HCC) 04/28/2023   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 10/13/2021   Complex sleep apnea syndrome 07/09/2021   Insomnia 07/09/2021   PLMD (periodic limb movement disorder) 07/09/2021   TIA (transient ischemic attack) 06/24/2021   Nocturnal seizure (HCC) 06/24/2021   Obstructive sleep apnea syndrome 06/24/2021   Degenerative joint disease of knee, left 12/10/2020   Right rotator cuff tear arthropathy 08/11/2019   Degenerative arthritis of right knee 07/13/2019   Acute medial meniscal  tear, right, initial encounter 07/13/2019   Baker's cyst, right 07/13/2019   GERD (gastroesophageal reflux disease) 01/06/2015   Cerumen impaction 07/31/2014   Abdominal pain, chronic, epigastric 04/17/2014   Arthritis 04/17/2014   Essential hypertension, benign 04/17/2014   History of adenomatous polyp of colon 08/11/2011   Sinus node dysfunction (HCC) 01/27/2011   CHEST WALL PAIN, ACUTE 10/11/2009   PACEMAKER-St.Jude 12/18/2008   SYNCOPE 07/24/2008   Hyperlipidemia 07/17/2008   Essential hypertension 07/17/2008    PCP: Glean Lamy  REFERRING PROVIDER: Napolean Backbone   REFERRING DIAG: Neck Pain   THERAPY DIAG:  Cervicalgia  Rationale for Evaluation and Treatment: Rehabilitation  ONSET DATE:   SUBJECTIVE:                                                                                                                                                                                      SUBJECTIVE STATEMENT:  states ongoing pain, on L side of Neck with L rotation. Pt feels pain improving.    Eval: Pt states Neck pain, ongoing, but worsening lately.  Pain On L side. He is R handed. Used to get massages which helped, but hasn't been lately.  States pain in region of : L UT, levator, into rhomboid,   none on R.   Has some R  shoulder pain   Hand dominance: Right   PERTINENT HISTORY: CA, bladder tumor, TIA, Pacemaker, HTN, seizure disorder, OA,   PAIN:  Are you having pain? Yes: NPRS scale: 4 /10 Pain location: L side of neck Pain description: sore, pinching  Aggravating factors: rotation and neck movement  Relieving factors: none stated   PRECAUTIONS: ICD/Pacemaker **  RED FLAGS: None   WEIGHT BEARING RESTRICTIONS: No  FALLS:  Has patient fallen in last 6 months? No   PLOF: Independent  PATIENT GOALS: Decreased pain in neck, improve movement   NEXT MD VISIT:  OBJECTIVE:   DIAGNOSTIC FINDINGS:    PATIENT SURVEYS :    COGNITION: Overall cognitive  status: Within functional limits for tasks assessed     SENSATION: WFL  POSTURE: fwd head, kyphosis, stiff upper body and neck posture.    UPPER EXTREMITY ROM:   Active  ROM Right eval Left eval  Shoulder flexion    Shoulder extension    Shoulder abduction    Shoulder adduction    Shoulder internal rotation    Shoulder external rotation        Neck  flexion  Mild limitation  Neck extension  Mod/significant limitation  L rotation  35  R rotation   55                  (Blank rows = not tested)  UPPER EXTREMITY MMT:  MMT Right eval Left eval  Shoulder flexion 4 4  Shoulder extension    Shoulder abduction 4 4  Shoulder adduction    Shoulder internal rotation    Shoulder external rotation    Middle trapezius    Lower trapezius    Elbow flexion    Elbow extension    Wrist flexion    Wrist extension    Wrist ulnar deviation    Wrist radial deviation    Wrist pronation    Wrist supination    Grip strength (lbs)    (Blank rows = not tested)  SHOULDER SPECIAL TESTS:   JOINT MOBILITY TESTING:  Very stiff c-spine and t-spine   PALPATION:  Pain :  L UT, levator,   pain in L mid-c-spine with L rotation.     TODAY'S TREATMENT:                                                                                                                                         DATE:   10/05/23: Exercise: Aerobic:  Supine: prom for cervical rotation and SB Seated: Cervical flexion x 5, Rotation x 10 bil;  light overpressure to L x 10;  Education and practice for optimal seated posture with more neutral head and t-spine position. Standing:  bwd shoulder rolls x 15;  Scap squeeze x 15; Rows Blue TB x 20;  Stretches:  Neuromuscular Re-education:   Manual Therapy:  STM to L paraspinals, L UT, levator,  light cervical PA s; manual UT stretching , light cervical distraction  Therapeutic Activity: Self Care:   Eval: manual: STM to L UT, levator, and paraspinals,  prom for  rotation and SB.   PATIENT EDUCATION:  Education details: education on initial HEP, seated posture for pain.  Person educated: Patient Education method: Explanation, Demonstration, Tactile cues, Verbal cues, and Handouts Education comprehension: verbalized understanding, returned demonstration, verbal cues required, tactile cues required, and needs further education   HOME EXERCISE PROGRAM: Access Code: [email or web address] URL: [email or web address] Date: 09/24/2023 Prepared by: Terrilee Few  Exercises - Standing  Backward Shoulder Rolls  - 2 x daily - 1 sets - 10 reps - Standing Scapular Retraction  - 2 x daily - 1 sets - 10 reps - Seated Cervical Rotation AROM  - 2 x daily - 1 sets - 10 reps  ASSESSMENT:  CLINICAL IMPRESSION: Pt continues to have pain on L side with end range L rotation. He does have less intense pain in the last week. Good tolerance for manual, will benefit from continued work on posture.   Eval: Patient presents with primary complaint of pain in L side of neck and cervical muscualture. Pt with much stiffness in c-spine and t-spine, with lack of range of motion. He has increased pain in L UT, levator region, but most pain in L mid/lateral c-spine, with L rotation. He has poor posture, with stiff/fwd head and kyphosis. He has lack of effective HEP for his ongoing pain.  Pt with decreased ability for full functional activities, driving, turing, exercise and  IADLs. Pt to benefit from skilled PT to improve deficits and pain.    OBJECTIVE IMPAIRMENTS: decreased activity tolerance, decreased mobility, decreased ROM, decreased strength, hypomobility, increased muscle spasms, impaired flexibility, postural dysfunction, and pain.   ACTIVITY LIMITATIONS: bending, hygiene/grooming, and locomotion level  PARTICIPATION LIMITATIONS: meal prep, cleaning, driving, shopping, and community activity  PERSONAL FACTORS: Past/current experiences and 1-2 comorbidities: stiffness,  posture, OA are also affecting patient's functional outcome.   REHAB POTENTIAL: Good  CLINICAL DECISION MAKING: Stable/uncomplicated  EVALUATION COMPLEXITY: Low  GOALS: Goals reviewed with patient? Yes   SHORT TERM GOALS: Target date: 10/04/23  Pt to be intendment with initial HEP  Goal status: INITIAL  2.  Pt to demo improved L rotation by at least 5 deg.   Goal status: INITIAL    LONG TERM GOALS: Target date: 11/15/23  Pt to be independent with final HEP  Goal status: INITIAL  2.  Pt to demo improved L cervical rotation, to be equal to R,  and pain free, to improve ability for ADLs.   Goal status: INITIAL  3.  Pt to report decreased pain in neck to 0-2/10 with activity and driving.   Goal status: INITIAL    PLAN: PT FREQUENCY: 1-2x/week  PT DURATION: 8 weeks  PLANNED INTERVENTIONS: Therapeutic exercises, Therapeutic activity, Neuromuscular re-education, Patient/Family education, Self Care, Joint mobilization, Joint manipulation, Stair training, DME instructions, Aquatic Therapy, Dry Needling, Electrical stimulation, Cryotherapy, Moist heat, Taping, Ultrasound, Ionotophoresis 4mg /ml Dexamethasone , Manual therapy,  Vasopneumatic device, Traction, Spinal manipulation, Spinal mobilization,    PLAN FOR NEXT SESSION:  manual, postural education, postural mobility and strength. Pain with L rotation ( on L)    Terrilee Few, PT, DPT 4:35 PM  10/05/23

## 2023-10-07 ENCOUNTER — Ambulatory Visit: Admitting: Physical Therapy

## 2023-10-07 ENCOUNTER — Encounter: Payer: Self-pay | Admitting: Physical Therapy

## 2023-10-07 DIAGNOSIS — M542 Cervicalgia: Secondary | ICD-10-CM

## 2023-10-07 NOTE — Therapy (Signed)
 OUTPATIENT PHYSICAL THERAPY UPPER EXTREMITY TREATMENT   Patient Name: [name] [name] MRN: [id number] DOB:[birth year], 83 y.o., male Today's Date: 10/07/2023  END OF SESSION:  PT End of Session - 10/07/23 1307     Visit Number 5    Number of Visits 16    Date for PT Re-Evaluation 11/15/23    Authorization Type HTA    PT Start Time 1307    PT Stop Time 1345    PT Time Calculation (min) 38 min    Activity Tolerance Patient tolerated treatment well    Behavior During Therapy [facility] for tasks assessed/performed            Past Medical History:  Diagnosis Date   Bladder tumor    Cancer (HCC)    Cardiac pacemaker in situ 01/27/2010   by dr Rodolfo Clan for documented SND,  St Jude dual chamber   GERD (gastroesophageal reflux disease)    History of adenomatous polyp of colon    History of basal cell carcinoma (BCC) excision    forehead and nose area 2015, 2016   History of squamous cell carcinoma excision    excision bilateral temple area  2015, 2016, 2019   History of syncope 2005   2005  w/ recurrent ;  documented sinus node dysfunction,   s/p PPM 2011  (03-20-2022  pt stated many yrs since last syncope)   History of transient ischemic attack (TIA)    Hyperlipidemia    Hypertension    Mild cognitive impairment    Nocturia    OA (osteoarthritis)    OSA on CPAP    followed by dr Albertina Hugger;   study in epic 06-23-2021 severe complex apnea syndrome,   pt stated usese cpap nightly   Presence of permanent cardiac pacemaker    RLS (restless legs syndrome)    Rotator cuff tear arthropathy, right    Seizure disorder [facility]) 2021   neurologist--- dr Janett Medin;   started 2021 nocturnal seizures;   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, not intractable , without status epilepticus  (03-20-2022  pt stated last seizure 01/ 2023)   Sinus node dysfunction [facility]) 2010   cardiologist--- dr Rodolfo Clan;  hx recurrent syncope;   04/ 2010  loop recorder  implant w/ documented SND;   01-27-2010  s/p PPM dual chamber , St Jude   TIA (transient ischemic attack)    Wears glasses    Wears hearing aid in both ears    Past Surgical History:  Procedure Laterality Date   CARDIAC PACEMAKER PLACEMENT  01/27/2010   @[facility]  by dr Rodolfo Clan;   explant loop recorder /  PPM  St Jude dual chamber   COLONOSCOPY WITH PROPOFOL   08/07/2020   dr pyrle   ESOPHAGOGASTRODUODENOSCOPY (EGD) WITH PROPOFOL   02/05/2014   dr pyrtle   LOOP RECORDER IMPLANT  08/17/2008   @[facility]   by dr Rodolfo Clan;   after tilt table study normal;  for recurrent syncope   PPM GENERATOR CHANGEOUT N/A 12/02/2022   Procedure: PPM GENERATOR CHANGEOUT;  Surgeon: Verona Goodwill, MD;  Location: [facility] INVASIVE CV LAB;  Service: Cardiovascular;  Laterality: N/A;   TRANSURETHRAL RESECTION OF BLADDER TUMOR WITH MITOMYCIN -C N/A 03/23/2022   Procedure: TRANSURETHRAL RESECTION OF BLADDER TUMOR WITH POST OPERATIVE GEMCITABINE ;  Surgeon: Trent Frizzle, MD;  Location: [facility];  Service: Urology;  Laterality: N/A;  45 MINS   TRANSURETHRAL RESECTION OF BLADDER TUMOR WITH MITOMYCIN -C N/A 05/14/2022   Procedure: TRANSURETHRAL RESECTION OF  BLADDER TUMOR WITH GEMCITABINE ;  Surgeon: Trent Frizzle, MD;  Location: [facility];  Service: Urology;  Laterality: N/A;  30 MINS   Patient Active Problem List   Diagnosis Date Noted   Degenerative cervical disc 09/01/2023   Trigger point of left shoulder region 09/01/2023   Bladder cancer (HCC) 04/28/2023   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 10/13/2021   Complex sleep apnea syndrome 07/09/2021   Insomnia 07/09/2021   PLMD (periodic limb movement disorder) 07/09/2021   TIA (transient ischemic attack) 06/24/2021   Nocturnal seizure (HCC) 06/24/2021   Obstructive sleep apnea syndrome 06/24/2021   Degenerative joint disease of knee, left 12/10/2020   Right rotator cuff tear arthropathy 08/11/2019   Degenerative arthritis of right knee 07/13/2019   Acute medial meniscal  tear, right, initial encounter 07/13/2019   Baker's cyst, right 07/13/2019   GERD (gastroesophageal reflux disease) 01/06/2015   Cerumen impaction 07/31/2014   Abdominal pain, chronic, epigastric 04/17/2014   Arthritis 04/17/2014   Essential hypertension, benign 04/17/2014   History of adenomatous polyp of colon 08/11/2011   Sinus node dysfunction (HCC) 01/27/2011   CHEST WALL PAIN, ACUTE 10/11/2009   PACEMAKER-St.Jude 12/18/2008   SYNCOPE 07/24/2008   Hyperlipidemia 07/17/2008   Essential hypertension 07/17/2008    PCP: Glean Lamy  REFERRING PROVIDER: Napolean Backbone   REFERRING DIAG: Neck Pain   THERAPY DIAG:  Cervicalgia  Rationale for Evaluation and Treatment: Rehabilitation  ONSET DATE:   SUBJECTIVE:                                                                                                                                                                                      SUBJECTIVE STATEMENT:  states ongoing pain, on L side of Neck with L rotation.    Eval: Pt states Neck pain, ongoing, but worsening lately.  Pain On L side. He is R handed. Used to get massages which helped, but hasn't been lately.  States pain in region of : L UT, levator, into rhomboid,   none on R.   Has some R  shoulder pain   Hand dominance: Right   PERTINENT HISTORY: CA, bladder tumor, TIA, Pacemaker, HTN, seizure disorder, OA,   PAIN:  Are you having pain? Yes: NPRS scale: 4 /10 Pain location: L side of neck Pain description: sore, pinching  Aggravating factors: rotation and neck movement  Relieving factors: none stated   PRECAUTIONS: ICD/Pacemaker **  RED FLAGS: None   WEIGHT BEARING RESTRICTIONS: No  FALLS:  Has patient fallen in last 6 months? No   PLOF: Independent  PATIENT GOALS: Decreased pain in neck, improve movement   NEXT MD VISIT:   OBJECTIVE:  DIAGNOSTIC FINDINGS:    PATIENT SURVEYS :    COGNITION: Overall cognitive status: Within functional  limits for tasks assessed     SENSATION: WFL  POSTURE: fwd head, kyphosis, stiff upper body and neck posture.    UPPER EXTREMITY ROM:   Active  ROM Right eval Left eval  Shoulder flexion    Shoulder extension    Shoulder abduction    Shoulder adduction    Shoulder internal rotation    Shoulder external rotation        Neck  flexion  Mild limitation  Neck extension  Mod/significant limitation  L rotation  35  R rotation   55                  (Blank rows = not tested)  UPPER EXTREMITY MMT:  MMT Right eval Left eval  Shoulder flexion 4 4  Shoulder extension    Shoulder abduction 4 4  Shoulder adduction    Shoulder internal rotation    Shoulder external rotation    Middle trapezius    Lower trapezius    Elbow flexion    Elbow extension    Wrist flexion    Wrist extension    Wrist ulnar deviation    Wrist radial deviation    Wrist pronation    Wrist supination    Grip strength (lbs)    (Blank rows = not tested)  SHOULDER SPECIAL TESTS:   JOINT MOBILITY TESTING:  Very stiff c-spine and t-spine   PALPATION:  Pain :  L UT, levator,   pain in L mid-c-spine with L rotation.     TODAY'S TREATMENT:                                                                                                                                         DATE:   10/07/23: Exercise: Aerobic:  Supine: prom for cervical flexion, rotation and SB Seated: Cervical flexion x 5, Rotation x 10 bil;  light overpressure to L x 10;  Standing:  bwd shoulder rolls x 15;  Scap squeeze x 15; Rows Blue TB x 20;  Stretches: doorway stretch 15 sec x 6 Neuromuscular Re-education:   Manual Therapy:  STM to L paraspinals, L UT, levator,  light cervical PA s; manual UT stretching , light cervical distraction  Therapeutic Activity: Self Care:   Eval: manual: STM to L UT, levator, and paraspinals,  prom for rotation and SB.   PATIENT EDUCATION:  Education details: education on initial HEP,  seated posture for pain.  Person educated: Patient Education method: Explanation, Demonstration, Tactile cues, Verbal cues, and Handouts Education comprehension: verbalized understanding, returned demonstration, verbal cues required, tactile cues required, and needs further education   HOME EXERCISE PROGRAM: Access Code: [email or web address] URL: [email or web address] Date: 09/24/2023 Prepared by: Terrilee Few  Exercises - Standing Backward Shoulder Rolls  - 2 x daily - 1 sets -  10 reps - Standing Scapular Retraction  - 2 x daily - 1 sets - 10 reps - Seated Cervical Rotation AROM  - 2 x daily - 1 sets - 10 reps  ASSESSMENT:  CLINICAL IMPRESSION: Pt continues to have pain on L side with end range L rotation. Most pain in L transverse process with deep palpation. He does have less pain after session and this week overall. Also with less pain in supine vs seated/standing positions. Pt to benefit from continued care.   Eval: Patient presents with primary complaint of pain in L side of neck and cervical muscualture. Pt with much stiffness in c-spine and t-spine, with lack of range of motion. He has increased pain in L UT, levator region, but most pain in L mid/lateral c-spine, with L rotation. He has poor posture, with stiff/fwd head and kyphosis. He has lack of effective HEP for his ongoing pain.  Pt with decreased ability for full functional activities, driving, turing, exercise and  IADLs. Pt to benefit from skilled PT to improve deficits and pain.    OBJECTIVE IMPAIRMENTS: decreased activity tolerance, decreased mobility, decreased ROM, decreased strength, hypomobility, increased muscle spasms, impaired flexibility, postural dysfunction, and pain.   ACTIVITY LIMITATIONS: bending, hygiene/grooming, and locomotion level  PARTICIPATION LIMITATIONS: meal prep, cleaning, driving, shopping, and community activity  PERSONAL FACTORS: Past/current experiences and 1-2 comorbidities:  stiffness, posture, OA are also affecting patient's functional outcome.   REHAB POTENTIAL: Good  CLINICAL DECISION MAKING: Stable/uncomplicated  EVALUATION COMPLEXITY: Low  GOALS: Goals reviewed with patient? Yes   SHORT TERM GOALS: Target date: 10/04/23  Pt to be intendment with initial HEP  Goal status: INITIAL  2.  Pt to demo improved L rotation by at least 5 deg.   Goal status: INITIAL    LONG TERM GOALS: Target date: 11/15/23  Pt to be independent with final HEP  Goal status: INITIAL  2.  Pt to demo improved L cervical rotation, to be equal to R,  and pain free, to improve ability for ADLs.   Goal status: INITIAL  3.  Pt to report decreased pain in neck to 0-2/10 with activity and driving.   Goal status: INITIAL    PLAN: PT FREQUENCY: 1-2x/week  PT DURATION: 8 weeks  PLANNED INTERVENTIONS: Therapeutic exercises, Therapeutic activity, Neuromuscular re-education, Patient/Family education, Self Care, Joint mobilization, Joint manipulation, Stair training, DME instructions, Aquatic Therapy, Dry Needling, Electrical stimulation, Cryotherapy, Moist heat, Taping, Ultrasound, Ionotophoresis 4mg /ml Dexamethasone , Manual therapy,  Vasopneumatic device, Traction, Spinal manipulation, Spinal mobilization,    PLAN FOR NEXT SESSION:  manual, postural education, postural mobility and strength. Pain with L rotation ( on L)    Terrilee Few, PT, DPT 1:15 PM  10/07/23

## 2023-10-11 NOTE — Addendum Note (Signed)
 Addended by: VICCI SELLER A on: 10/11/2023 03:57 PM   Modules accepted: Orders

## 2023-10-11 NOTE — Progress Notes (Signed)
 Remote pacemaker transmission.

## 2023-10-12 ENCOUNTER — Encounter: Admitting: Physical Therapy

## 2023-10-14 ENCOUNTER — Encounter: Payer: Self-pay | Admitting: Physical Therapy

## 2023-10-14 ENCOUNTER — Ambulatory Visit: Payer: Medicare Other | Admitting: Family Medicine

## 2023-10-14 ENCOUNTER — Ambulatory Visit: Payer: Self-pay | Admitting: Physical Therapy

## 2023-10-14 DIAGNOSIS — M542 Cervicalgia: Secondary | ICD-10-CM

## 2023-10-14 NOTE — Therapy (Signed)
 OUTPATIENT PHYSICAL THERAPY UPPER EXTREMITY TREATMENT   Patient Name: [name] [name] MRN: [id number] DOB:01/10/1941, 83 y.o., male Today's Date: 10/14/2023  END OF SESSION:  PT End of Session - 10/14/23 1307     Visit Number 6    Number of Visits 16    Date for PT Re-Evaluation 11/15/23    Authorization Type HTA    PT Start Time 1306    PT Stop Time 1345    PT Time Calculation (min) 39 min    Activity Tolerance Patient tolerated treatment well    Behavior During Therapy [facility] for tasks assessed/performed            Past Medical History:  Diagnosis Date   Bladder tumor    Cancer (HCC)    Cardiac pacemaker in situ 01/27/2010   by dr fernande for documented SND,  St Jude dual chamber   GERD (gastroesophageal reflux disease)    History of adenomatous polyp of colon    History of basal cell carcinoma (BCC) excision    forehead and nose area 2015, 2016   History of squamous cell carcinoma excision    excision bilateral temple area  2015, 2016, 2019   History of syncope 2005   2005  w/ recurrent ;  documented sinus node dysfunction,   s/p PPM 2011  (03-20-2022  pt stated many yrs since last syncope)   History of transient ischemic attack (TIA)    Hyperlipidemia    Hypertension    Mild cognitive impairment    Nocturia    OA (osteoarthritis)    OSA on CPAP    followed by dr chalice;   study in epic 06-23-2021 severe complex apnea syndrome,   pt stated usese cpap nightly   Presence of permanent cardiac pacemaker    RLS (restless legs syndrome)    Rotator cuff tear arthropathy, right    Seizure disorder [facility]) 2021   neurologist--- dr rosemarie;   started 2021 nocturnal seizures;   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, not intractable , without status epilepticus  (03-20-2022  pt stated last seizure 01/ 2023)   Sinus node dysfunction [facility]) 2010   cardiologist--- dr fernande;  hx recurrent syncope;   04/ 2010  loop recorder  implant w/ documented SND;   01-27-2010  s/p PPM dual chamber , St Jude   TIA (transient ischemic attack)    Wears glasses    Wears hearing aid in both ears    Past Surgical History:  Procedure Laterality Date   CARDIAC PACEMAKER PLACEMENT  01/27/2010   @[facility]  by dr fernande;   explant loop recorder /  PPM  St Jude dual chamber   COLONOSCOPY WITH PROPOFOL   08/07/2020   dr pyrle   ESOPHAGOGASTRODUODENOSCOPY (EGD) WITH PROPOFOL   02/05/2014   dr pyrtle   LOOP RECORDER IMPLANT  08/17/2008   @[facility]   by dr fernande;   after tilt table study normal;  for recurrent syncope   PPM GENERATOR CHANGEOUT N/A 12/02/2022   Procedure: PPM GENERATOR CHANGEOUT;  Surgeon: fernande Elspeth BROCKS, MD;  Location: [facility] INVASIVE CV LAB;  Service: Cardiovascular;  Laterality: N/A;   TRANSURETHRAL RESECTION OF BLADDER TUMOR WITH MITOMYCIN -C N/A 03/23/2022   Procedure: TRANSURETHRAL RESECTION OF BLADDER TUMOR WITH POST OPERATIVE GEMCITABINE ;  Surgeon: Matilda Senior, MD;  Location: [facility];  Service: Urology;  Laterality: N/A;  45 MINS   TRANSURETHRAL RESECTION OF BLADDER TUMOR WITH MITOMYCIN -C N/A 05/14/2022   Procedure: TRANSURETHRAL RESECTION OF  BLADDER TUMOR WITH GEMCITABINE ;  Surgeon: Matilda Senior, MD;  Location: [facility];  Service: Urology;  Laterality: N/A;  30 MINS   Patient Active Problem List   Diagnosis Date Noted   Degenerative cervical disc 09/01/2023   Trigger point of left shoulder region 09/01/2023   Bladder cancer (HCC) 04/28/2023   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 10/13/2021   Complex sleep apnea syndrome 07/09/2021   Insomnia 07/09/2021   PLMD (periodic limb movement disorder) 07/09/2021   TIA (transient ischemic attack) 06/24/2021   Nocturnal seizure (HCC) 06/24/2021   Obstructive sleep apnea syndrome 06/24/2021   Degenerative joint disease of knee, left 12/10/2020   Right rotator cuff tear arthropathy 08/11/2019   Degenerative arthritis of right knee 07/13/2019   Acute medial meniscal  tear, right, initial encounter 07/13/2019   Baker's cyst, right 07/13/2019   GERD (gastroesophageal reflux disease) 01/06/2015   Cerumen impaction 07/31/2014   Abdominal pain, chronic, epigastric 04/17/2014   Arthritis 04/17/2014   Essential hypertension, benign 04/17/2014   History of adenomatous polyp of colon 08/11/2011   Sinus node dysfunction (HCC) 01/27/2011   CHEST WALL PAIN, ACUTE 10/11/2009   PACEMAKER-St.Jude 12/18/2008   SYNCOPE 07/24/2008   Hyperlipidemia 07/17/2008   Essential hypertension 07/17/2008    PCP: Wolm Scarlet  REFERRING PROVIDER: Darlyn Sharps   REFERRING DIAG: Neck Pain   THERAPY DIAG:  Cervicalgia  Rationale for Evaluation and Treatment: Rehabilitation  ONSET DATE:   SUBJECTIVE:                                                                                                                                                                                      SUBJECTIVE STATEMENT:    Pt states improving pain in neck.    Eval: Pt states Neck pain, ongoing, but worsening lately.  Pain On L side. He is R handed. Used to get massages which helped, but hasn't been lately.  States pain in region of : L UT, levator, into rhomboid,   none on R.   Has some R  shoulder pain   Hand dominance: Right   PERTINENT HISTORY: CA, bladder tumor, TIA, Pacemaker, HTN, seizure disorder, OA,   PAIN:  Are you having pain? Yes: NPRS scale: 4 /10 Pain location: L side of neck Pain description: sore, pinching  Aggravating factors: rotation and neck movement  Relieving factors: none stated   PRECAUTIONS: ICD/Pacemaker **  RED FLAGS: None   WEIGHT BEARING RESTRICTIONS: No  FALLS:  Has patient fallen in last 6 months? No   PLOF: Independent  PATIENT GOALS: Decreased pain in neck, improve movement   NEXT MD VISIT:   OBJECTIVE:   DIAGNOSTIC FINDINGS:  PATIENT SURVEYS :    COGNITION: Overall cognitive status: Within functional limits for tasks  assessed     SENSATION: WFL  POSTURE: fwd head, kyphosis, stiff upper body and neck posture.    UPPER EXTREMITY ROM:   Active  ROM Right eval Left eval  Shoulder flexion    Shoulder extension    Shoulder abduction    Shoulder adduction    Shoulder internal rotation    Shoulder external rotation        Neck  flexion  Mild limitation  Neck extension  Mod/significant limitation  L rotation  35  R rotation   55                  (Blank rows = not tested)  UPPER EXTREMITY MMT:  MMT Right eval Left eval  Shoulder flexion 4 4  Shoulder extension    Shoulder abduction 4 4  Shoulder adduction    Shoulder internal rotation    Shoulder external rotation    Middle trapezius    Lower trapezius    Elbow flexion    Elbow extension    Wrist flexion    Wrist extension    Wrist ulnar deviation    Wrist radial deviation    Wrist pronation    Wrist supination    Grip strength (lbs)    (Blank rows = not tested)  SHOULDER SPECIAL TESTS:   JOINT MOBILITY TESTING:  Very stiff c-spine and t-spine   PALPATION:  Pain :  L UT, levator,   pain in L mid-c-spine with L rotation.     TODAY'S TREATMENT:                                                                                                                                         DATE:   10/14/23: Exercise: Aerobic:  Supine: prom for cervical flexion, rotation and SB Seated: Cervical flexion x 10,    Rotation x 10 bil;  light overpressure to L x 10;  Standing:  bwd shoulder rolls x 15;  Rows Blue TB x 20;  Bil ER YTB x 20;   Shoulder scaption arom x 12- practice for neutral neck position with all standing activity.  Stretches: Neuromuscular Re-education:   Manual Therapy:  STM to L paraspinals, L UT, levator,  light cervical PA s; manual UT stretching , light cervical distraction  Therapeutic Activity: Self Care:    10/07/23: Exercise: Aerobic:  Supine: prom for cervical flexion, rotation and SB Seated:  Cervical flexion x 5, Rotation x 10 bil;  light overpressure to L x 10;  Standing:  bwd shoulder rolls x 15;  Scap squeeze x 15; Rows Blue TB x 20;  Stretches: doorway stretch 15 sec x 6 Neuromuscular Re-education:   Manual Therapy:  STM to L paraspinals, L UT, levator,  light cervical PA s; manual UT stretching , light cervical distraction  Therapeutic Activity: Self Care:   Eval: manual: STM to L UT, levator, and paraspinals,  prom for rotation and SB.   PATIENT EDUCATION:  Education details: education on initial HEP, seated posture for pain.  Person educated: Patient Education method: Explanation, Demonstration, Tactile cues, Verbal cues, and Handouts Education comprehension: verbalized understanding, returned demonstration, verbal cues required, tactile cues required, and needs further education   HOME EXERCISE PROGRAM: Access Code: [email or web address] URL: [email or web address] Date: 09/24/2023 Prepared by: Tinnie Don  Exercises - Standing Backward Shoulder Rolls  - 2 x daily - 1 sets - 10 reps - Standing Scapular Retraction  - 2 x daily - 1 sets - 10 reps - Seated Cervical Rotation AROM  - 2 x daily - 1 sets - 10 reps  ASSESSMENT:  CLINICAL IMPRESSION: Pt with no pain today with rotation or rotation to the L. He has mild tightness with flexion, which improved with repeated motions. Showing improvements today for for first time, without pain with ROM. Requires mod cueing for head posture with UE strengthening.   Eval: Patient presents with primary complaint of pain in L side of neck and cervical muscualture. Pt with much stiffness in c-spine and t-spine, with lack of range of motion. He has increased pain in L UT, levator region, but most pain in L mid/lateral c-spine, with L rotation. He has poor posture, with stiff/fwd head and kyphosis. He has lack of effective HEP for his ongoing pain.  Pt with decreased ability for full functional activities, driving, turing,  exercise and  IADLs. Pt to benefit from skilled PT to improve deficits and pain.    OBJECTIVE IMPAIRMENTS: decreased activity tolerance, decreased mobility, decreased ROM, decreased strength, hypomobility, increased muscle spasms, impaired flexibility, postural dysfunction, and pain.   ACTIVITY LIMITATIONS: bending, hygiene/grooming, and locomotion level  PARTICIPATION LIMITATIONS: meal prep, cleaning, driving, shopping, and community activity  PERSONAL FACTORS: Past/current experiences and 1-2 comorbidities: stiffness, posture, OA are also affecting patient's functional outcome.   REHAB POTENTIAL: Good  CLINICAL DECISION MAKING: Stable/uncomplicated  EVALUATION COMPLEXITY: Low  GOALS: Goals reviewed with patient? Yes   SHORT TERM GOALS: Target date: 10/04/23  Pt to be intendment with initial HEP  Goal status: INITIAL  2.  Pt to demo improved L rotation by at least 5 deg.   Goal status: INITIAL    LONG TERM GOALS: Target date: 11/15/23  Pt to be independent with final HEP  Goal status: INITIAL  2.  Pt to demo improved L cervical rotation, to be equal to R,  and pain free, to improve ability for ADLs.   Goal status: INITIAL  3.  Pt to report decreased pain in neck to 0-2/10 with activity and driving.   Goal status: INITIAL    PLAN: PT FREQUENCY: 1-2x/week  PT DURATION: 8 weeks  PLANNED INTERVENTIONS: Therapeutic exercises, Therapeutic activity, Neuromuscular re-education, Patient/Family education, Self Care, Joint mobilization, Joint manipulation, Stair training, DME instructions, Aquatic Therapy, Dry Needling, Electrical stimulation, Cryotherapy, Moist heat, Taping, Ultrasound, Ionotophoresis 4mg /ml Dexamethasone , Manual therapy,  Vasopneumatic device, Traction, Spinal manipulation, Spinal mobilization,    PLAN FOR NEXT SESSION:  manual, postural education, postural mobility and strength. Pain with L rotation ( on L)    Tinnie Don, PT, DPT 9:34 PM   10/14/23

## 2023-10-20 NOTE — Progress Notes (Deleted)
 [name] [name] [facility] [address] [place] [address] Phone: [phone] Subjective:    I'm seeing this patient by the request  of:  Micheal Wolm ORN, MD  CC:   [name]  09/01/2023 Patient given injections today to try to help with some of the muscle tightness noted today. Discussed icing regimen and home exercises. Increase activity slowly. I do feel that formal physical therapy and potentially dry needling could be helpful. Follow-up again in 6 to 8 weeks    Arthritic changes noted.  Discussed icing regimen and home exercises.  Discussed formal physical therapy and will be referred accordingly.  X-ray shows severe arthritic changes left greater than right of the facet joints noted.  Do think that potential laboratory workup will be necessary otherwise.  Follow-up again in 6 to 8 weeks.     Update 11/01/2023 LION FERNANDEZ is a 83 y.o. male coming in with complaint of cervical spine pain. Patient has been doing PT at [facility]. Patient states        Past Medical History:  Diagnosis Date   Bladder tumor    Cancer [facility])    Cardiac pacemaker in situ 01/27/2010   by dr fernande for documented SND,  St Jude dual chamber   GERD (gastroesophageal reflux disease)    History of adenomatous polyp of colon    History of basal cell carcinoma (BCC) excision    forehead and nose area 2015, 2016   History of squamous cell carcinoma excision    excision bilateral temple area  2015, 2016, 2019   History of syncope 2005   2005  w/ recurrent ;  documented sinus node dysfunction,   s/p PPM 2011  (03-20-2022  pt stated many yrs since last syncope)   History of transient ischemic attack (TIA)    Hyperlipidemia    Hypertension    Mild cognitive impairment    Nocturia    OA (osteoarthritis)    OSA on CPAP    followed by dr chalice;   study in epic 06-23-2021 severe complex apnea syndrome,   pt stated usese cpap nightly   Presence of permanent cardiac  pacemaker    RLS (restless legs syndrome)    Rotator cuff tear arthropathy, right    Seizure disorder [facility]) 2021   neurologist--- dr rosemarie;   started 2021 nocturnal seizures;   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, not intractable , without status epilepticus  (03-20-2022  pt stated last seizure 01/ 2023)   Sinus node dysfunction [facility]) 2010   cardiologist--- dr fernande;  hx recurrent syncope;   04/ 2010  loop recorder implant w/ documented SND;   01-27-2010  s/p PPM dual chamber , St Jude   TIA (transient ischemic attack)    Wears glasses    Wears hearing aid in both ears    Past Surgical History:  Procedure Laterality Date   CARDIAC PACEMAKER PLACEMENT  01/27/2010   @[facility]  by dr fernande;   explant loop recorder /  PPM  St Jude dual chamber   COLONOSCOPY WITH PROPOFOL   08/07/2020   dr pyrle   ESOPHAGOGASTRODUODENOSCOPY (EGD) WITH PROPOFOL   02/05/2014   dr pyrtle   LOOP RECORDER IMPLANT  08/17/2008   @[facility]   by dr fernande;   after tilt table study normal;  for recurrent syncope   PPM GENERATOR CHANGEOUT N/A 12/02/2022   Procedure: PPM GENERATOR CHANGEOUT;  Surgeon: fernande Elspeth BROCKS, MD;  Location: [facility] INVASIVE  CV LAB;  Service: Cardiovascular;  Laterality: N/A;   TRANSURETHRAL RESECTION OF BLADDER TUMOR WITH MITOMYCIN -C N/A 03/23/2022   Procedure: TRANSURETHRAL RESECTION OF BLADDER TUMOR WITH POST OPERATIVE GEMCITABINE ;  Surgeon: Matilda Senior, MD;  [facility];  Service: Urology;  Laterality: N/A;  45 MINS   TRANSURETHRAL RESECTION OF BLADDER TUMOR WITH MITOMYCIN -C N/A 05/14/2022   Procedure: TRANSURETHRAL RESECTION OF BLADDER TUMOR WITH GEMCITABINE ;  Surgeon: Matilda Senior, MD;  Location: [facility];  Service: Urology;  Laterality: N/A;  30 MINS   Social History   Socioeconomic History   Marital status: Married    Spouse name: Peggy   Number of children: 1   Years of education: Not on file   Highest education  level: Bachelor's degree (e.g., BA, AB, BS)  Occupational History   Occupation: CO-OWNER    Employer: [employer]   Tobacco Use   Smoking status: Former    Current packs/day: 0.00    Types: Cigarettes    Start date: 74    Quit date: 1982    Years since quitting: 43.5   Smokeless tobacco: Never  Vaping Use   Vaping status: Never Used  Substance and Sexual Activity   Alcohol use: Yes    Alcohol/week: 14.0 standard drinks of alcohol    Types: 14 Cans of beer per week    Comment: 03-20-2022 2 beers daily , 12 oz each   Drug use: Yes    Frequency: 7.0 times per week    Types: Marijuana    Comment: 03/22/2022   Sexual activity: Not on file  Other Topics Concern   Not on file  Social History Narrative   Lives with spouse   Right Handed   3-4 c caffeine    Social Drivers of Health   Financial Resource Strain: Low Risk  (09/10/2023)   Overall Financial Resource Strain (CARDIA)    Difficulty of Paying Living Expenses: Not hard at all  Food Insecurity: No Food Insecurity (09/10/2023)   Hunger Vital Sign    Worried About Running Out of Food in the Last Year: Never true    Ran Out of Food in the Last Year: Never true  Transportation Needs: No Transportation Needs (09/10/2023)   PRAPARE - Administrator, Civil Service (Medical): No    Lack of Transportation (Non-Medical): No  Physical Activity: Sufficiently Active (09/10/2023)   Exercise Vital Sign    Days of Exercise per Week: 5 days    Minutes of Exercise per Session: 30 min  Stress: No Stress Concern Present (09/10/2023)   [employer] of Occupational Health - Occupational Stress Questionnaire    Feeling of Stress : Not at all  Social Connections: Moderately Isolated (09/10/2023)   Social Connection and Isolation Panel    Frequency of Communication with Friends and Family: Three times a week    Frequency of Social Gatherings with Friends and Family: Twice a week    Attends Religious Services: Never     Database administrator or Organizations: No    Attends Engineer, structural: Not on file    Marital Status: Married   No Known Allergies Family History  Problem Relation Age of Onset   Hypertension Mother    Stroke Mother    Colitis Father    Hypertension Father    Other Father        esophageal stricture   Liver disease Father    Alcohol abuse Maternal Aunt    Rectal cancer Neg  Hx    Stomach cancer Neg Hx    Esophageal cancer Neg Hx    Colon polyps Neg Hx    Colon cancer Neg Hx      Current Outpatient Medications (Cardiovascular):    benazepril  (LOTENSIN ) 40 MG tablet, TAKE 1 TABLET BY MOUTH EVERY DAY   rosuvastatin  (CRESTOR ) 5 MG tablet, TAKE 1 TABLET BY MOUTH EVERY DAY (Patient taking differently: Take 2.5 mg by mouth every evening.)     Current Outpatient Medications (Other):    Ascorbic Acid (VITAMIN C PO), Take 1,000 mg by mouth 2 (two) times a week.   cholecalciferol (VITAMIN D3) 25 MCG (1000 UNIT) tablet, Take 1,000 Units by mouth daily.   donepezil  (ARICEPT ) 10 MG tablet, Take 1 tablet (10 mg total) by mouth at bedtime.   levETIRAcetam  (KEPPRA ) 500 MG tablet, Take one tablet in am and two tablets in pm.   NON FORMULARY, Pt uses a cpap nightly   traZODone  (DESYREL ) 100 MG tablet, Take 1 tablet (100 mg total) by mouth at bedtime as needed. for sleep   Turmeric (QC TUMERIC COMPLEX PO), Take 1 capsule by mouth 2 (two) times a week.   Reviewed prior external information including notes and imaging from  primary care provider As well as notes that were available from care everywhere and other healthcare systems.  Past medical history, social, surgical and family history all reviewed in electronic medical record.  No pertanent information unless stated regarding to the chief complaint.   Review of Systems:  No headache, visual changes, nausea, vomiting, diarrhea, constipation, dizziness, abdominal pain, skin rash, fevers, chills, night sweats, weight loss,  swollen lymph nodes, body aches, joint swelling, chest pain, shortness of breath, mood changes. POSITIVE muscle aches  Objective  There were no vitals taken for this visit.   General: No apparent distress alert and oriented x3 mood and affect normal, dressed appropriately.  HEENT: Pupils equal, extraocular movements intact  Respiratory: Patient's speak in full sentences and does not appear short of breath  Cardiovascular: No lower extremity edema, non tender, no erythema      Impression and Recommendations:

## 2023-10-25 DIAGNOSIS — C672 Malignant neoplasm of lateral wall of bladder: Secondary | ICD-10-CM | POA: Diagnosis not present

## 2023-10-26 ENCOUNTER — Ambulatory Visit: Admitting: Physical Therapy

## 2023-10-26 ENCOUNTER — Encounter: Payer: Self-pay | Admitting: Physical Therapy

## 2023-10-26 DIAGNOSIS — M542 Cervicalgia: Secondary | ICD-10-CM

## 2023-10-26 DIAGNOSIS — G3184 Mild cognitive impairment, so stated: Secondary | ICD-10-CM | POA: Diagnosis not present

## 2023-10-26 NOTE — Therapy (Unsigned)
 OUTPATIENT PHYSICAL THERAPY UPPER EXTREMITY TREATMENT   Patient Name: [name] [name] MRN: [id number] DOB:1940/06/27, 83 y.o., male Today's Date: 10/26/2023  END OF SESSION:  PT End of Session - 10/26/23 1319     Visit Number 7    Number of Visits 16    Date for PT Re-Evaluation 11/15/23    Authorization Type HTA    PT Start Time 1305    PT Stop Time 1343    PT Time Calculation (min) 38 min    Activity Tolerance Patient tolerated treatment well    Behavior During Therapy [facility] for tasks assessed/performed             Past Medical History:  Diagnosis Date   Bladder tumor    Cancer (HCC)    Cardiac pacemaker in situ 01/27/2010   by dr fernande for documented SND,  St Jude dual chamber   GERD (gastroesophageal reflux disease)    History of adenomatous polyp of colon    History of basal cell carcinoma (BCC) excision    forehead and nose area 2015, 2016   History of squamous cell carcinoma excision    excision bilateral temple area  2015, 2016, 2019   History of syncope 2005   2005  w/ recurrent ;  documented sinus node dysfunction,   s/p PPM 2011  (03-20-2022  pt stated many yrs since last syncope)   History of transient ischemic attack (TIA)    Hyperlipidemia    Hypertension    Mild cognitive impairment    Nocturia    OA (osteoarthritis)    OSA on CPAP    followed by dr chalice;   study in epic 06-23-2021 severe complex apnea syndrome,   pt stated usese cpap nightly   Presence of permanent cardiac pacemaker    RLS (restless legs syndrome)    Rotator cuff tear arthropathy, right    Seizure disorder [facility]) 2021   neurologist--- dr rosemarie;   started 2021 nocturnal seizures;   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, not intractable , without status epilepticus  (03-20-2022  pt stated last seizure 01/ 2023)   Sinus node dysfunction [facility]) 2010   cardiologist--- dr fernande;  hx recurrent syncope;   04/ 2010  loop recorder  implant w/ documented SND;   01-27-2010  s/p PPM dual chamber , St Jude   TIA (transient ischemic attack)    Wears glasses    Wears hearing aid in both ears    Past Surgical History:  Procedure Laterality Date   CARDIAC PACEMAKER PLACEMENT  01/27/2010   @[facility]  by dr fernande;   explant loop recorder /  PPM  St Jude dual chamber   COLONOSCOPY WITH PROPOFOL   08/07/2020   dr pyrle   ESOPHAGOGASTRODUODENOSCOPY (EGD) WITH PROPOFOL   02/05/2014   dr pyrtle   LOOP RECORDER IMPLANT  08/17/2008   @[facility]   by dr fernande;   after tilt table study normal;  for recurrent syncope   PPM GENERATOR CHANGEOUT N/A 12/02/2022   Procedure: PPM GENERATOR CHANGEOUT;  Surgeon: fernande Elspeth BROCKS, MD;  Location: [facility] [place] INVASIVE CV LAB;  Service: Cardiovascular;  Laterality: N/A;   TRANSURETHRAL RESECTION OF BLADDER TUMOR WITH MITOMYCIN -C N/A 03/23/2022   Procedure: TRANSURETHRAL RESECTION OF BLADDER TUMOR WITH POST OPERATIVE GEMCITABINE ;  Surgeon: Matilda Senior, MD;  Location: [facility];  Service: Urology;  Laterality: N/A;  45 MINS   TRANSURETHRAL RESECTION OF BLADDER TUMOR WITH MITOMYCIN -C N/A 05/14/2022   Procedure: TRANSURETHRAL RESECTION  OF BLADDER TUMOR WITH GEMCITABINE ;  Surgeon: Matilda Senior, MD;  Location: [facility];  Service: Urology;  Laterality: N/A;  30 MINS   Patient Active Problem List   Diagnosis Date Noted   Degenerative cervical disc 09/01/2023   Trigger point of left shoulder region 09/01/2023   Bladder cancer (HCC) 04/28/2023   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 10/13/2021   Complex sleep apnea syndrome 07/09/2021   Insomnia 07/09/2021   PLMD (periodic limb movement disorder) 07/09/2021   TIA (transient ischemic attack) 06/24/2021   Nocturnal seizure (HCC) 06/24/2021   Obstructive sleep apnea syndrome 06/24/2021   Degenerative joint disease of knee, left 12/10/2020   Right rotator cuff tear arthropathy 08/11/2019   Degenerative arthritis of right knee 07/13/2019   Acute medial meniscal  tear, right, initial encounter 07/13/2019   Baker's cyst, right 07/13/2019   GERD (gastroesophageal reflux disease) 01/06/2015   Cerumen impaction 07/31/2014   Abdominal pain, chronic, epigastric 04/17/2014   Arthritis 04/17/2014   Essential hypertension, benign 04/17/2014   History of adenomatous polyp of colon 08/11/2011   Sinus node dysfunction (HCC) 01/27/2011   CHEST WALL PAIN, ACUTE 10/11/2009   PACEMAKER-St.Jude 12/18/2008   SYNCOPE 07/24/2008   Hyperlipidemia 07/17/2008   Essential hypertension 07/17/2008    PCP: Wolm Scarlet  REFERRING PROVIDER: Darlyn Sharps   REFERRING DIAG: Neck Pain   THERAPY DIAG:  Cervicalgia  Rationale for Evaluation and Treatment: Rehabilitation  ONSET DATE:   SUBJECTIVE:                                                                                                                                                                                      SUBJECTIVE STATEMENT:    Pt states improving pain in neck, no pain with turning head, but continues to feel tightness on L side.   Eval: Pt states Neck pain, ongoing, but worsening lately.  Pain On L side. He is R handed. Used to get massages which helped, but hasn't been lately.  States pain in region of : L UT, levator, into rhomboid,   none on R.   Has some R  shoulder pain   Hand dominance: Right   PERTINENT HISTORY: CA, bladder tumor, TIA, Pacemaker, HTN, seizure disorder, OA,   PAIN:  Are you having pain? Yes: NPRS scale:  5 /10, pt reports no pain, but then also 5/10 pain.  Pain location: L side of neck Pain description: sore, pinching  Aggravating factors: rotation and neck movement  Relieving factors: none stated   PRECAUTIONS: ICD/Pacemaker **  RED FLAGS: None   WEIGHT BEARING RESTRICTIONS: No  FALLS:  Has patient fallen in last 6 months? No  PLOF: Independent  PATIENT GOALS: Decreased pain in neck, improve movement   NEXT MD VISIT:   OBJECTIVE:    DIAGNOSTIC FINDINGS:    PATIENT SURVEYS :    COGNITION: Overall cognitive status: Within functional limits for tasks assessed     SENSATION: WFL  POSTURE: fwd head, kyphosis, stiff upper body and neck posture.    UPPER EXTREMITY ROM:   Active  ROM Right eval Left eval  Shoulder flexion    Shoulder extension    Shoulder abduction    Shoulder adduction    Shoulder internal rotation    Shoulder external rotation        Neck  flexion  Mild limitation  Neck extension  Mod/significant limitation  L rotation  35  R rotation   55                  (Blank rows = not tested)  UPPER EXTREMITY MMT:  MMT Right eval Left eval  Shoulder flexion 4 4  Shoulder extension    Shoulder abduction 4 4  Shoulder adduction    Shoulder internal rotation    Shoulder external rotation    Middle trapezius    Lower trapezius    Elbow flexion    Elbow extension    Wrist flexion    Wrist extension    Wrist ulnar deviation    Wrist radial deviation    Wrist pronation    Wrist supination    Grip strength (lbs)    (Blank rows = not tested)  SHOULDER SPECIAL TESTS:   JOINT MOBILITY TESTING:  Very stiff c-spine and t-spine   PALPATION:  Pain :  L UT, levator,   pain in L mid-c-spine with L rotation.     TODAY'S TREATMENT:                                                                                                                                         DATE:   10/26/23: Exercise: Aerobic:  Supine: prom for cervical flexion, rotation and SB Seated: Cervical flexion x 10,    Rotation x 10 bil;  light overpressure to L x 10;  Standing:  bwd shoulder rolls x 15;  Rows Blue TB x 20;  Bil ER YTB x 20;   Shoulder scaption arom x 12- practice for neutral neck position with all standing activity.  Stretches: Neuromuscular Re-education:   Manual Therapy:  STM to L paraspinals, L UT, levator,  light cervical PA s; manual UT stretching , light cervical distraction  Therapeutic  Activity: Self Care:    10/07/23: Exercise: Aerobic:  Supine: prom for cervical flexion, rotation and SB Seated: Cervical flexion x 5, Rotation x 10 bil;  light overpressure to L x 10;  Standing:  bwd shoulder rolls x 15;  Scap squeeze x 15; Rows Blue TB x 20;  Stretches: doorway stretch 15 sec x 6  Neuromuscular Re-education:   Manual Therapy:  STM to L paraspinals, L UT, levator,  light cervical PA s; manual UT stretching , light cervical distraction  Therapeutic Activity: Self Care:   Eval: manual: STM to L UT, levator, and paraspinals,  prom for rotation and SB.   PATIENT EDUCATION:  Education details: education on initial HEP, seated posture for pain.  Person educated: Patient Education method: Explanation, Demonstration, Tactile cues, Verbal cues, and Handouts Education comprehension: verbalized understanding, returned demonstration, verbal cues required, tactile cues required, and needs further education   HOME EXERCISE PROGRAM: Access Code: [email or web address] URL: [email or web address] Date: 09/24/2023 Prepared by: Tinnie Don  Exercises - Standing Backward Shoulder Rolls  - 2 x daily - 1 sets - 10 reps - Standing Scapular Retraction  - 2 x daily - 1 sets - 10 reps - Seated Cervical Rotation AROM  - 2 x daily - 1 sets - 10 reps  ASSESSMENT:  CLINICAL IMPRESSION: Difficult to assess current pain levels, pt with varying subjective, with no pain,but still reports 5/10 pain.   Pt with no pain today with rotation or rotation to the L. He has mild tightness with flexion, which improved with repeated motions. Showing improvements today for for first time, without pain with ROM. Requires mod cueing for head posture with UE strengthening.   Eval: Patient presents with primary complaint of pain in L side of neck and cervical muscualture. Pt with much stiffness in c-spine and t-spine, with lack of range of motion. He has increased pain in L UT, levator region, but most  pain in L mid/lateral c-spine, with L rotation. He has poor posture, with stiff/fwd head and kyphosis. He has lack of effective HEP for his ongoing pain.  Pt with decreased ability for full functional activities, driving, turing, exercise and  IADLs. Pt to benefit from skilled PT to improve deficits and pain.    OBJECTIVE IMPAIRMENTS: decreased activity tolerance, decreased mobility, decreased ROM, decreased strength, hypomobility, increased muscle spasms, impaired flexibility, postural dysfunction, and pain.   ACTIVITY LIMITATIONS: bending, hygiene/grooming, and locomotion level  PARTICIPATION LIMITATIONS: meal prep, cleaning, driving, shopping, and community activity  PERSONAL FACTORS: Past/current experiences and 1-2 comorbidities: stiffness, posture, OA are also affecting patient's functional outcome.   REHAB POTENTIAL: Good  CLINICAL DECISION MAKING: Stable/uncomplicated  EVALUATION COMPLEXITY: Low  GOALS: Goals reviewed with patient? Yes   SHORT TERM GOALS: Target date: 10/04/23  Pt to be intendment with initial HEP  Goal status: INITIAL  2.  Pt to demo improved L rotation by at least 5 deg.   Goal status: INITIAL    LONG TERM GOALS: Target date: 11/15/23  Pt to be independent with final HEP  Goal status: INITIAL  2.  Pt to demo improved L cervical rotation, to be equal to R,  and pain free, to improve ability for ADLs.   Goal status: INITIAL  3.  Pt to report decreased pain in neck to 0-2/10 with activity and driving.   Goal status: INITIAL    PLAN: PT FREQUENCY: 1-2x/week  PT DURATION: 8 weeks  PLANNED INTERVENTIONS: Therapeutic exercises, Therapeutic activity, Neuromuscular re-education, Patient/Family education, Self Care, Joint mobilization, Joint manipulation, Stair training, DME instructions, Aquatic Therapy, Dry Needling, Electrical stimulation, Cryotherapy, Moist heat, Taping, Ultrasound, Ionotophoresis 4mg /ml Dexamethasone , Manual therapy,   Vasopneumatic device, Traction, Spinal manipulation, Spinal mobilization,    PLAN FOR NEXT SESSION:   manual, postural education, postural mobility and strength. Pain with L rotation ( on L)    Tinnie Don,  PT, DPT 1:19 PM  10/26/23

## 2023-10-28 ENCOUNTER — Encounter: Payer: Self-pay | Admitting: Physical Therapy

## 2023-10-28 ENCOUNTER — Ambulatory Visit: Admitting: Physical Therapy

## 2023-10-28 DIAGNOSIS — M542 Cervicalgia: Secondary | ICD-10-CM

## 2023-10-28 NOTE — Therapy (Signed)
 OUTPATIENT PHYSICAL THERAPY UPPER EXTREMITY TREATMENT   Patient Name: [name] [name] MRN: [id number] DOB:05/27/1940, 83 y.o., male Today's Date: 10/26/2023  END OF SESSION:  PT End of Session - 10/28/23 1306     Visit Number 8    Number of Visits 16    Date for PT Re-Evaluation 11/15/23    Authorization Type HTA    PT Start Time 1306    PT Stop Time 1345    PT Time Calculation (min) 39 min    Activity Tolerance Patient tolerated treatment well    Behavior During Therapy [facility] for tasks assessed/performed             Past Medical History:  Diagnosis Date   Bladder tumor    Cancer (HCC)    Cardiac pacemaker in situ 01/27/2010   by dr fernande for documented SND,  St Jude dual chamber   GERD (gastroesophageal reflux disease)    History of adenomatous polyp of colon    History of basal cell carcinoma (BCC) excision    forehead and nose area 2015, 2016   History of squamous cell carcinoma excision    excision bilateral temple area  2015, 2016, 2019   History of syncope 2005   2005  w/ recurrent ;  documented sinus node dysfunction,   s/p PPM 2011  (03-20-2022  pt stated many yrs since last syncope)   History of transient ischemic attack (TIA)    Hyperlipidemia    Hypertension    Mild cognitive impairment    Nocturia    OA (osteoarthritis)    OSA on CPAP    followed by dr chalice;   study in epic 06-23-2021 severe complex apnea syndrome,   pt stated usese cpap nightly   Presence of permanent cardiac pacemaker    RLS (restless legs syndrome)    Rotator cuff tear arthropathy, right    Seizure disorder [facility]) 2021   neurologist--- dr rosemarie;   started 2021 nocturnal seizures;   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, not intractable , without status epilepticus  (03-20-2022  pt stated last seizure 01/ 2023)   Sinus node dysfunction [facility]) 2010   cardiologist--- dr fernande;  hx recurrent syncope;   04/ 2010  loop recorder  implant w/ documented SND;   01-27-2010  s/p PPM dual chamber , St Jude   TIA (transient ischemic attack)    Wears glasses    Wears hearing aid in both ears    Past Surgical History:  Procedure Laterality Date   CARDIAC PACEMAKER PLACEMENT  01/27/2010   @[facility]  by dr fernande;   explant loop recorder /  PPM  St Jude dual chamber   COLONOSCOPY WITH PROPOFOL   08/07/2020   dr pyrle   ESOPHAGOGASTRODUODENOSCOPY (EGD) WITH PROPOFOL   02/05/2014   dr pyrtle   LOOP RECORDER IMPLANT  08/17/2008   @[facility]   by dr fernande;   after tilt table study normal;  for recurrent syncope   PPM GENERATOR CHANGEOUT N/A 12/02/2022   Procedure: PPM GENERATOR CHANGEOUT;  Surgeon: fernande Elspeth BROCKS, MD;  Location: [facility] INVASIVE CV LAB;  Service: Cardiovascular;  Laterality: N/A;   TRANSURETHRAL RESECTION OF BLADDER TUMOR WITH MITOMYCIN -C N/A 03/23/2022   Procedure: TRANSURETHRAL RESECTION OF BLADDER TUMOR WITH POST OPERATIVE GEMCITABINE ;  Surgeon: Matilda Senior, MD;  Location: [facility];  Service: Urology;  Laterality: N/A;  45 MINS   TRANSURETHRAL RESECTION OF BLADDER TUMOR WITH MITOMYCIN -C N/A 05/14/2022   Procedure: TRANSURETHRAL RESECTION  OF BLADDER TUMOR WITH GEMCITABINE ;  Surgeon: Matilda Senior, MD;  Location: [facility];  Service: Urology;  Laterality: N/A;  30 MINS   Patient Active Problem List   Diagnosis Date Noted   Degenerative cervical disc 09/01/2023   Trigger point of left shoulder region 09/01/2023   Bladder cancer (HCC) 04/28/2023   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 10/13/2021   Complex sleep apnea syndrome 07/09/2021   Insomnia 07/09/2021   PLMD (periodic limb movement disorder) 07/09/2021   TIA (transient ischemic attack) 06/24/2021   Nocturnal seizure (HCC) 06/24/2021   Obstructive sleep apnea syndrome 06/24/2021   Degenerative joint disease of knee, left 12/10/2020   Right rotator cuff tear arthropathy 08/11/2019   Degenerative arthritis of right knee 07/13/2019   Acute medial meniscal  tear, right, initial encounter 07/13/2019   Baker's cyst, right 07/13/2019   GERD (gastroesophageal reflux disease) 01/06/2015   Cerumen impaction 07/31/2014   Abdominal pain, chronic, epigastric 04/17/2014   Arthritis 04/17/2014   Essential hypertension, benign 04/17/2014   History of adenomatous polyp of colon 08/11/2011   Sinus node dysfunction (HCC) 01/27/2011   CHEST WALL PAIN, ACUTE 10/11/2009   PACEMAKER-St.Jude 12/18/2008   SYNCOPE 07/24/2008   Hyperlipidemia 07/17/2008   Essential hypertension 07/17/2008    PCP: Wolm Scarlet  REFERRING PROVIDER: Darlyn Sharps   REFERRING DIAG: Neck Pain   THERAPY DIAG:  Cervicalgia  Rationale for Evaluation and Treatment: Rehabilitation  ONSET DATE:   SUBJECTIVE:                                                                                                                                                                                      SUBJECTIVE STATEMENT:    Pt states improving pain in neck, L still tight, but thinks its moving to the back/center of his neck .   Eval: Pt states Neck pain, ongoing, but worsening lately.  Pain On L side. He is R handed. Used to get massages which helped, but hasn't been lately.  States pain in region of : L UT, levator, into rhomboid,   none on R.   Has some R  shoulder pain   Hand dominance: Right   PERTINENT HISTORY: CA, bladder tumor, TIA, Pacemaker, HTN, seizure disorder, OA,   PAIN:  Are you having pain? Yes: NPRS scale:  5 /10, pt reports no pain, but then also 5/10 pain.  Pain location: L side of neck Pain description: sore, pinching  Aggravating factors: rotation and neck movement  Relieving factors: none stated   PRECAUTIONS: ICD/Pacemaker **  RED FLAGS: None   WEIGHT BEARING RESTRICTIONS: No  FALLS:  Has patient fallen in last 6 months? No  PLOF: Independent  PATIENT GOALS: Decreased pain in neck, improve movement   NEXT MD VISIT:   OBJECTIVE:    DIAGNOSTIC FINDINGS:    PATIENT SURVEYS :    COGNITION: Overall cognitive status: Within functional limits for tasks assessed     SENSATION: WFL  POSTURE: fwd head, kyphosis, stiff upper body and neck posture.    UPPER EXTREMITY ROM:   Active  ROM Right eval Left eval  Shoulder flexion    Shoulder extension    Shoulder abduction    Shoulder adduction    Shoulder internal rotation    Shoulder external rotation        Neck  flexion  Mild limitation  Neck extension  Mod/significant limitation  L rotation  35  R rotation   55                  (Blank rows = not tested)  UPPER EXTREMITY MMT:  MMT Right eval Left eval  Shoulder flexion 4 4  Shoulder extension    Shoulder abduction 4 4  Shoulder adduction    Shoulder internal rotation    Shoulder external rotation    Middle trapezius    Lower trapezius    Elbow flexion    Elbow extension    Wrist flexion    Wrist extension    Wrist ulnar deviation    Wrist radial deviation    Wrist pronation    Wrist supination    Grip strength (lbs)    (Blank rows = not tested)  SHOULDER SPECIAL TESTS:   JOINT MOBILITY TESTING:  Very stiff c-spine and t-spine   PALPATION:  Pain :  L UT, levator,   pain in L mid-c-spine with L rotation.     TODAY'S TREATMENT:                                                                                                                                         DATE:   10/28/23: Exercise: Aerobic:  Supine: prom for cervical flexion, rotation and SB;  pec stretch 30 sec x 3; horiz abd 2  x 10;  Seated:  pulley/flexion x 15 ; Cervical flexion x 10,    Rotation x 10 bil;  thoracic rotation x 15;  Standing:  bwd shoulder rolls x 15;  shrugs x 10, scap squeeze x 10;  Rows Blue TB x 20;  Stretches: LTR x 10 Neuromuscular Re-education:   Manual Therapy:    cervical PA s; manual UT stretching , light cervical distraction ,  Therapeutic Activity: Self  Care:    10/07/23: Exercise: Aerobic:  Supine: prom for cervical flexion, rotation and SB Seated: Cervical flexion x 5, Rotation x 10 bil;  light overpressure to L x 10;  Standing:  bwd shoulder rolls x 15;  Scap squeeze x 15; Rows Blue TB x 20;  Stretches: doorway stretch 15 sec x 6  Neuromuscular Re-education:   Manual Therapy:  STM to L paraspinals, L UT, levator,  light cervical PA s; manual UT stretching , light cervical distraction  Therapeutic Activity: Self Care:   Eval: manual: STM to L UT, levator, and paraspinals,  prom for rotation and SB.   PATIENT EDUCATION:  Education details: updated and reviewed HEP , posture  Person educated: Patient Education method: Explanation, Demonstration, Tactile cues, Verbal cues, and Handouts Education comprehension: verbalized understanding, returned demonstration, verbal cues required, tactile cues required, and needs further education   HOME EXERCISE PROGRAM: Access Code: [email or web address]   ASSESSMENT:  CLINICAL IMPRESSION: Pt reports decreased soreness with ther ex, but still some pain in center of c-spine at end of session with ROM. Encouraged hep for ROM a few times/day. Pt will benefit from likely 2 more visits, will progress to d/c as able. He reports improvements with sessions, feeling looser, but pain has not resolved.   Eval: Patient presents with primary complaint of pain in L side of neck and cervical muscualture. Pt with much stiffness in c-spine and t-spine, with lack of range of motion. He has increased pain in L UT, levator region, but most pain in L mid/lateral c-spine, with L rotation. He has poor posture, with stiff/fwd head and kyphosis. He has lack of effective HEP for his ongoing pain.  Pt with decreased ability for full functional activities, driving, turing, exercise and  IADLs. Pt to benefit from skilled PT to improve deficits and pain.    OBJECTIVE IMPAIRMENTS: decreased activity tolerance, decreased mobility,  decreased ROM, decreased strength, hypomobility, increased muscle spasms, impaired flexibility, postural dysfunction, and pain.   ACTIVITY LIMITATIONS: bending, hygiene/grooming, and locomotion level  PARTICIPATION LIMITATIONS: meal prep, cleaning, driving, shopping, and community activity  PERSONAL FACTORS: Past/current experiences and 1-2 comorbidities: stiffness, posture, OA are also affecting patient's functional outcome.   REHAB POTENTIAL: Good  CLINICAL DECISION MAKING: Stable/uncomplicated  EVALUATION COMPLEXITY: Low  GOALS: Goals reviewed with patient? Yes   SHORT TERM GOALS: Target date: 10/04/23  Pt to be intendment with initial HEP  Goal status: MET  2.  Pt to demo improved L rotation by at least 5 deg.   Goal status: MET    LONG TERM GOALS: Target date: 11/15/23  Pt to be independent with final HEP  Goal status: In progress  2.  Pt to demo improved L cervical rotation, to be equal to R,  and pain free, to improve ability for ADLs.   Goal status: In progress  3.  Pt to report decreased pain in neck to 0-2/10 with activity and driving.   Goal status: In progress    PLAN: PT FREQUENCY: 1-2x/week  PT DURATION: 8 weeks  PLANNED INTERVENTIONS: Therapeutic exercises, Therapeutic activity, Neuromuscular re-education, Patient/Family education, Self Care, Joint mobilization, Joint manipulation, Stair training, DME instructions, Aquatic Therapy, Dry Needling, Electrical stimulation, Cryotherapy, Moist heat, Taping, Ultrasound, Ionotophoresis 4mg /ml Dexamethasone , Manual therapy,  Vasopneumatic device, Traction, Spinal manipulation, Spinal mobilization,    PLAN FOR NEXT SESSION:   manual, postural education, postural mobility and strength. Pain with L rotation ( on L)    Tinnie Don, PT, DPT 1:06 PM  10/28/23

## 2023-11-01 ENCOUNTER — Ambulatory Visit: Admitting: Family Medicine

## 2023-11-01 DIAGNOSIS — C672 Malignant neoplasm of lateral wall of bladder: Secondary | ICD-10-CM | POA: Diagnosis not present

## 2023-11-02 ENCOUNTER — Encounter: Payer: Self-pay | Admitting: Family Medicine

## 2023-11-02 ENCOUNTER — Ambulatory Visit (INDEPENDENT_AMBULATORY_CARE_PROVIDER_SITE_OTHER): Admitting: Family Medicine

## 2023-11-02 VITALS — BP 128/68 | HR 71 | Temp 97.9°F | Wt 150.3 lb

## 2023-11-02 DIAGNOSIS — J3489 Other specified disorders of nose and nasal sinuses: Secondary | ICD-10-CM | POA: Diagnosis not present

## 2023-11-02 MED ORDER — AZELASTINE HCL 0.1 % NA SOLN: 1.0000 | Freq: Two times a day (BID) | NASAL | 11 refills | Status: AC

## 2023-11-02 MED ORDER — AZELASTINE HCL 0.1 % NA SOLN: 1.0000 | Freq: Two times a day (BID) | NASAL | 11 refills | Status: DC

## 2023-11-02 NOTE — Progress Notes (Signed)
 Established Patient Office Visit  Subjective   Patient ID: [name] [name], male    DOB: 03/21/1941  Age: 83 y.o. MRN: [id number]  Chief Complaint  Patient presents with   Nasal Congestion    HPI   [name] [name] is seen with some persistent nasal drip symptoms.  These are somewhat intermittent.  He thinks this may be related to the cold air going from outdoors to the inside.  He had been on Flonase but developed some epistaxis.  He then shifted to oral antihistamine with Allegra but did not see any improvement.  His symptoms are not daily or consistent.  No purulent discharge.  No sinusitis symptoms.  Past Medical History:  Diagnosis Date   Bladder tumor    Cancer [facility])    Cardiac pacemaker in situ 01/27/2010   by dr fernande for documented SND,  St Jude dual chamber   GERD (gastroesophageal reflux disease)    History of adenomatous polyp of colon    History of basal cell carcinoma (BCC) excision    forehead and nose area 2015, 2016   History of squamous cell carcinoma excision    excision bilateral temple area  2015, 2016, 2019   History of syncope 2005   2005  w/ recurrent ;  documented sinus node dysfunction,   s/p PPM 2011  (03-20-2022  pt stated many yrs since last syncope)   History of transient ischemic attack (TIA)    Hyperlipidemia    Hypertension    Mild cognitive impairment    Nocturia    OA (osteoarthritis)    OSA on CPAP    followed by dr chalice;   study in epic 06-23-2021 severe complex apnea syndrome,   pt stated usese cpap nightly   Presence of permanent cardiac pacemaker    RLS (restless legs syndrome)    Rotator cuff tear arthropathy, right    Seizure disorder [facility]) 2021   neurologist--- dr rosemarie;   started 2021 nocturnal seizures;   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, not intractable , without status epilepticus  (03-20-2022  pt stated last seizure 01/ 2023)   Sinus node dysfunction [facility]) 2010    cardiologist--- dr fernande;  hx recurrent syncope;   04/ 2010  loop recorder implant w/ documented SND;   01-27-2010  s/p PPM dual chamber , St Jude   TIA (transient ischemic attack)    Wears glasses    Wears hearing aid in both ears    Past Surgical History:  Procedure Laterality Date   CARDIAC PACEMAKER PLACEMENT  01/27/2010   @[facility]  by dr fernande;   explant loop recorder /  PPM  St Jude dual chamber   COLONOSCOPY WITH PROPOFOL   08/07/2020   dr pyrle   ESOPHAGOGASTRODUODENOSCOPY (EGD) WITH PROPOFOL   02/05/2014   dr pyrtle   LOOP RECORDER IMPLANT  08/17/2008   @[facility]   by dr fernande;   after tilt table study normal;  for recurrent syncope   PPM GENERATOR CHANGEOUT N/A 12/02/2022   Procedure: PPM GENERATOR CHANGEOUT;  Surgeon: fernande Elspeth BROCKS, MD;  Location: [facility] INVASIVE CV LAB;  Service: Cardiovascular;  Laterality: N/A;   TRANSURETHRAL RESECTION OF BLADDER TUMOR WITH MITOMYCIN -C N/A 03/23/2022   Procedure: TRANSURETHRAL RESECTION OF BLADDER TUMOR WITH POST OPERATIVE GEMCITABINE ;  Surgeon: Matilda Senior, MD;  Location: [facility];  Service: Urology;  Laterality: N/A;  45 MINS   TRANSURETHRAL RESECTION OF BLADDER TUMOR WITH MITOMYCIN -C N/A 05/14/2022   Procedure: TRANSURETHRAL  RESECTION OF BLADDER TUMOR WITH GEMCITABINE ;  Surgeon: Matilda Senior, MD;  Location: [facility];  Service: Urology;  Laterality: N/A;  30 MINS    reports that he quit smoking about 43 years ago. His smoking use included cigarettes. He started smoking about 68 years ago. He has never used smokeless tobacco. He reports current alcohol use of about 14.0 standard drinks of alcohol per week. He reports current drug use. Frequency: 7.00 times per week. Drug: Marijuana. family history includes Alcohol abuse in his maternal aunt; Colitis in his father; Hypertension in his father and mother; Liver disease in his father; Other in his father; Stroke in his mother. No Known Allergies  Review of Systems  Constitutional:   Negative for chills and fever.  HENT:  Negative for nosebleeds and sore throat.   Respiratory:  Negative for cough.       Objective:     BP 128/68 (BP Location: Left Arm, Cuff Size: Normal)   Pulse 71   Temp 97.9 F (36.6 C) (Oral)   Wt 150 lb 4.8 oz (68.2 kg)   SpO2 96%   BMI 23.54 kg/m  BP Readings from Last 3 Encounters:  11/02/23 128/68  09/14/23 (!) 142/64  09/01/23 138/64   Wt Readings from Last 3 Encounters:  11/02/23 150 lb 4.8 oz (68.2 kg)  09/14/23 150 lb 9.6 oz (68.3 kg)  09/01/23 155 lb (70.3 kg)      Physical Exam Vitals reviewed.  Constitutional:      General: He is not in acute distress.    Appearance: He is not ill-appearing.  HENT:     Nose: Nose normal.     Comments: Mucosa is unremarkable.  No significant mucus at this time.  No blood clots.  No ulcerations. Cardiovascular:     Rate and Rhythm: Normal rate and regular rhythm.  Pulmonary:     Effort: Pulmonary effort is normal.     Breath sounds: Normal breath sounds. No wheezing or rales.  Neurological:     Mental Status: He is alert.      No results found for any visits on 11/02/23.    The ASCVD Risk score (Arnett DK, et al., 2019) failed to calculate for the following reasons:   The 2019 ASCVD risk score is only valid for ages 26 to 62    Assessment & Plan:   Rhinorrhea/postnasal drip.  Question allergic versus nonallergic rhinitis.  He has tried Allegra without improvement.  Has some occasional nosebleeds with Flonase.  We suggested a trial of Astelin  nasal 1 to 2 sprays per nostril up to twice daily as needed.  Be in touch if this is not improving  Wolm Scarlet, MD

## 2023-11-03 DIAGNOSIS — C44329 Squamous cell carcinoma of skin of other parts of face: Secondary | ICD-10-CM | POA: Diagnosis not present

## 2023-11-03 DIAGNOSIS — Z85828 Personal history of other malignant neoplasm of skin: Secondary | ICD-10-CM | POA: Diagnosis not present

## 2023-11-04 DIAGNOSIS — G3184 Mild cognitive impairment, so stated: Secondary | ICD-10-CM | POA: Diagnosis not present

## 2023-11-08 DIAGNOSIS — D3001 Benign neoplasm of right kidney: Secondary | ICD-10-CM | POA: Diagnosis not present

## 2023-11-08 NOTE — Progress Notes (Signed)
 [name] [address] [place] [address] Phone: [phone] Subjective:   [name] [name], am serving as a scribe for Dr. Arthea Claudene.  I'm seeing this patient by the request  of:  [name] Wolm ORN, MD  CC: Left neck left neck pain.  [name] [name]  [name] [name] is a 83 y.o. male coming in with complaint of left neck pain, moderate arthritic changes noted of the cervical spine also moderate to severe arthritic changes noted of the left shoulder.  Has been in physical therapy.  Patient states that he is having constant tightness and pain in L side of cervical spine. Pain radiates into top of shoulder. Rotation to the R is limited.        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   by dr fernande for documented SND,  St Jude dual chamber   GERD (gastroesophageal reflux disease)    History of adenomatous polyp of colon    History of basal cell carcinoma (BCC) excision    forehead and nose area 2015, 2016   History of squamous cell carcinoma excision    excision bilateral temple area  2015, 2016, 2019   History of syncope 2005   2005  w/ recurrent ;  documented sinus node dysfunction,   s/p PPM 2011  (03-20-2022  pt stated many yrs since last syncope)   History of transient ischemic attack (TIA)    Hyperlipidemia    Hypertension    Mild cognitive impairment    Nocturia    OA (osteoarthritis)    OSA on CPAP    followed by dr chalice;   study in epic 06-23-2021 severe complex apnea syndrome,   pt stated usese cpap nightly   Presence of permanent cardiac pacemaker    RLS (restless legs syndrome)    Rotator cuff tear arthropathy, right    Seizure disorder [name]) 2021   neurologist--- dr rosemarie;   started 2021 nocturnal seizures;   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, not intractable , without status epilepticus   (03-20-2022  pt stated last seizure 01/ 2023)   Sinus node dysfunction [facility]) 2010   cardiologist--- dr fernande;  hx recurrent syncope;   04/ 2010  loop recorder implant w/ documented SND;   01-27-2010  s/p PPM dual chamber , St Jude   TIA (transient ischemic attack)    Wears glasses    Wears hearing aid in both ears    Past Surgical History:  Procedure Laterality Date   CARDIAC PACEMAKER PLACEMENT  01/27/2010   @[facility]  by dr fernande;   explant loop recorder /  PPM  St Jude dual chamber   COLONOSCOPY WITH PROPOFOL   08/07/2020   dr pyrle   ESOPHAGOGASTRODUODENOSCOPY (EGD) WITH PROPOFOL   02/05/2014   dr pyrtle   LOOP RECORDER IMPLANT  08/17/2008   @[facility]   by dr fernande;   after tilt table study normal;  for recurrent syncope   PPM GENERATOR CHANGEOUT N/A 12/02/2022   Procedure: PPM GENERATOR CHANGEOUT;  Surgeon: fernande Elspeth BROCKS, MD;  Location: [facility] INVASIVE CV LAB;  Service: Cardiovascular;  Laterality: N/A;   TRANSURETHRAL RESECTION OF BLADDER TUMOR WITH MITOMYCIN -C N/A 03/23/2022   Procedure: TRANSURETHRAL RESECTION OF BLADDER TUMOR WITH POST OPERATIVE GEMCITABINE ;  Surgeon: Matilda Senior, MD;  Location: [facility];  Service: Urology;  Laterality: N/A;  45 MINS  TRANSURETHRAL RESECTION OF BLADDER TUMOR WITH MITOMYCIN -C N/A 05/14/2022   Procedure: TRANSURETHRAL RESECTION OF BLADDER TUMOR WITH GEMCITABINE ;  Surgeon: Matilda Senior, MD;  Location: [facility];  Service: Urology;  Laterality: N/A;  30 MINS   Social History   Socioeconomic History   Marital status: Married    Spouse name: Peggy   Number of children: 1   Years of education: Not on file   Highest education level: Bachelor's degree (e.g., BA, AB, BS)  Occupational History   Occupation: CO-OWNER    Employer: [employer]   Tobacco Use   Smoking status: Former    Current packs/day: 0.00    Types: Cigarettes    Start date: 65    Quit date: 1982    Years since quitting: 43.5   Smokeless tobacco: Never  Vaping Use    Vaping status: Never Used  Substance and Sexual Activity   Alcohol use: Yes    Alcohol/week: 14.0 standard drinks of alcohol    Types: 14 Cans of beer per week    Comment: 03-20-2022 2 beers daily , 12 oz each   Drug use: Yes    Frequency: 7.0 times per week    Types: Marijuana    Comment: 03/22/2022   Sexual activity: Not on file  Other Topics Concern   Not on file  Social History Narrative   Lives with spouse   Right Handed   3-4 c caffeine    Social Drivers of Health   Financial Resource Strain: Low Risk  (09/10/2023)   Overall Financial Resource Strain (CARDIA)    Difficulty of Paying Living Expenses: Not hard at all  Food Insecurity: No Food Insecurity (09/10/2023)   Hunger Vital Sign    Worried About Running Out of Food in the Last Year: Never true    Ran Out of Food in the Last Year: Never true  Transportation Needs: No Transportation Needs (09/10/2023)   PRAPARE - Administrator, Civil Service (Medical): No    Lack of Transportation (Non-Medical): No  Physical Activity: Sufficiently Active (09/10/2023)   Exercise Vital Sign    Days of Exercise per Week: 5 days    Minutes of Exercise per Session: 30 min  Stress: No Stress Concern Present (09/10/2023)   [employer] of Occupational Health - Occupational Stress Questionnaire    Feeling of Stress : Not at all  Social Connections: Moderately Isolated (09/10/2023)   Social Connection and Isolation Panel    Frequency of Communication with Friends and Family: Three times a week    Frequency of Social Gatherings with Friends and Family: Twice a week    Attends Religious Services: Never    Database administrator or Organizations: No    Attends Engineer, structural: Not on file    Marital Status: Married   No Known Allergies Family History  Problem Relation Age of Onset   Hypertension Mother    Stroke Mother    Colitis Father    Hypertension Father    Other Father        esophageal stricture    Liver disease Father    Alcohol abuse Maternal Aunt    Rectal cancer Neg Hx    Stomach cancer Neg Hx    Esophageal cancer Neg Hx    Colon polyps Neg Hx    Colon cancer Neg Hx      Current Outpatient Medications (Cardiovascular):    benazepril  (LOTENSIN ) 40 MG tablet, TAKE 1 TABLET BY MOUTH EVERY DAY  rosuvastatin  (CRESTOR ) 5 MG tablet, TAKE 1 TABLET BY MOUTH EVERY DAY (Patient taking differently: Take 2.5 mg by mouth every evening.)  Current Outpatient Medications (Respiratory):    azelastine  (ASTELIN ) 0.1 % nasal spray, Place 1 spray into both nostrils 2 (two) times daily. Use in each nostril as directed    Current Outpatient Medications (Other):    Ascorbic Acid (VITAMIN C PO), Take 1,000 mg by mouth 2 (two) times a week.   cholecalciferol (VITAMIN D3) 25 MCG (1000 UNIT) tablet, Take 1,000 Units by mouth daily.   donepezil  (ARICEPT ) 10 MG tablet, Take 1 tablet (10 mg total) by mouth at bedtime.   levETIRAcetam  (KEPPRA ) 500 MG tablet, Take one tablet in am and two tablets in pm.   NON FORMULARY, Pt uses a cpap nightly   traZODone  (DESYREL ) 100 MG tablet, Take 1 tablet (100 mg total) by mouth at bedtime as needed. for sleep   Turmeric (QC TUMERIC COMPLEX PO), Take 1 capsule by mouth 2 (two) times a week.   Reviewed prior external information including notes and imaging from  primary care provider As well as notes that were available from care everywhere and other healthcare systems.  Past medical history, social, surgical and family history all reviewed in electronic medical record.  No pertanent information unless stated regarding to the chief complaint.   Review of Systems:  No , visual changes, nausea, vomiting, diarrhea, constipation, dizziness, abdominal pain, skin ras joint swelling, chest pain, shortness of breath, mood changes. POSITIVE muscle aches, body aches, headache  Objective  Blood pressure 112/76, pulse (!) 55, height 5' 7 (1.702 m), weight 149 lb (67.6  kg), SpO2 98%.   General: No apparent distress alert and oriented x3 mood and affect normal, dressed appropriately.  HEENT: Pupils equal, extraocular movements intact  Neck exam does have significant loss of lordosis.  No significant crepitus noted.  .  Unable to go more than 5 degrees of extension of the neck.  No severe discomfort noted.  Really start setting of neutral.  No sidebending whatsoever.  Unable to even rotate the head to the left.  Significant tightness of the musculature in the area.    Impression and Recommendations:    The above documentation has been reviewed and is accurate and complete Rodert Hinch M Ayen Viviano, DO

## 2023-11-09 ENCOUNTER — Ambulatory Visit: Admitting: Family Medicine

## 2023-11-09 VITALS — BP 112/76 | HR 55 | Ht 67.0 in | Wt 149.0 lb

## 2023-11-09 DIAGNOSIS — M5030 Other cervical disc degeneration, unspecified cervical region: Secondary | ICD-10-CM | POA: Diagnosis not present

## 2023-11-09 DIAGNOSIS — M542 Cervicalgia: Secondary | ICD-10-CM

## 2023-11-09 NOTE — Assessment & Plan Note (Signed)
 Severe worsening pain in the neck especially on the left side.  Increase in limitation in range of motion.  Patient has 0 degrees of left sided rotation and left-sided sidebending.  Able to go even 2 degrees of extension past midline with the neck.  Audible popping heard.  Affecting daily activities as well as his sleep.  Failed formal physical therapy.  Will get MRI of the cervical spine and patient does have a pacemaker so we will have to do it at the hospital.  Depending on findings we will see if patient is a candidate for possible epidurals.  Follow-up with me again after imaging

## 2023-11-09 NOTE — Patient Instructions (Signed)
 MRI at [facility]  When we receive your results we will contact you.

## 2023-11-10 ENCOUNTER — Telehealth: Payer: Self-pay

## 2023-11-10 NOTE — Telephone Encounter (Signed)
 Left a detail message letting pt know he can have an MRI with his device.

## 2023-11-11 ENCOUNTER — Other Ambulatory Visit (HOSPITAL_COMMUNITY)

## 2023-11-11 ENCOUNTER — Other Ambulatory Visit: Payer: Self-pay | Admitting: Family Medicine

## 2023-11-11 DIAGNOSIS — M542 Cervicalgia: Secondary | ICD-10-CM

## 2023-11-11 NOTE — Progress Notes (Signed)
  Device system confirmed to be MRI conditional, with implant date > 6 weeks ago, and no evidence of abandoned or epicardial leads in review of most recent CXR  Device last cleared by EP Provider: Elvie RIddle, NP  Clearance is good through for 1 year as long as parameters remain stable at time of check. If pt undergoes a cardiac device procedure during that time, they should be re-cleared.   Tachy-therapies to be programmed off if applicable with device back to pre-MRI settings after completion of exam.  [employer] will be present for programming for the MRI.   Suzan Recardo Arna Debby  11/11/2023 1:45 PM

## 2023-11-15 ENCOUNTER — Emergency Department (HOSPITAL_BASED_OUTPATIENT_CLINIC_OR_DEPARTMENT_OTHER)

## 2023-11-15 ENCOUNTER — Observation Stay (HOSPITAL_BASED_OUTPATIENT_CLINIC_OR_DEPARTMENT_OTHER)
Admission: EM | Admit: 2023-11-15 | Discharge: 2023-11-16 | Disposition: A | Discharge status: Home / Self Care | Attending: Emergency Medicine | Admitting: Emergency Medicine

## 2023-11-15 ENCOUNTER — Encounter (HOSPITAL_COMMUNITY): Payer: Self-pay | Admitting: Internal Medicine

## 2023-11-15 ENCOUNTER — Other Ambulatory Visit: Payer: Self-pay

## 2023-11-15 DIAGNOSIS — I6529 Occlusion and stenosis of unspecified carotid artery: Secondary | ICD-10-CM | POA: Diagnosis not present

## 2023-11-15 DIAGNOSIS — N289 Disorder of kidney and ureter, unspecified: Secondary | ICD-10-CM | POA: Diagnosis not present

## 2023-11-15 DIAGNOSIS — S0990XA Unspecified injury of head, initial encounter: Secondary | ICD-10-CM | POA: Diagnosis not present

## 2023-11-15 DIAGNOSIS — Z8669 Personal history of other diseases of the nervous system and sense organs: Secondary | ICD-10-CM | POA: Insufficient documentation

## 2023-11-15 DIAGNOSIS — R55 Syncope and collapse: Principal | ICD-10-CM | POA: Diagnosis present

## 2023-11-15 DIAGNOSIS — I10 Essential (primary) hypertension: Secondary | ICD-10-CM | POA: Diagnosis not present

## 2023-11-15 DIAGNOSIS — E785 Hyperlipidemia, unspecified: Secondary | ICD-10-CM | POA: Diagnosis not present

## 2023-11-15 DIAGNOSIS — I700 Atherosclerosis of aorta: Secondary | ICD-10-CM | POA: Diagnosis not present

## 2023-11-15 DIAGNOSIS — Z87898 Personal history of other specified conditions: Secondary | ICD-10-CM | POA: Diagnosis not present

## 2023-11-15 DIAGNOSIS — N2889 Other specified disorders of kidney and ureter: Secondary | ICD-10-CM | POA: Insufficient documentation

## 2023-11-15 DIAGNOSIS — E782 Mixed hyperlipidemia: Secondary | ICD-10-CM | POA: Diagnosis not present

## 2023-11-15 DIAGNOSIS — Z87891 Personal history of nicotine dependence: Secondary | ICD-10-CM | POA: Insufficient documentation

## 2023-11-15 DIAGNOSIS — R0789 Other chest pain: Secondary | ICD-10-CM | POA: Diagnosis not present

## 2023-11-15 DIAGNOSIS — F1090 Alcohol use, unspecified, uncomplicated: Secondary | ICD-10-CM | POA: Diagnosis not present

## 2023-11-15 DIAGNOSIS — R918 Other nonspecific abnormal finding of lung field: Secondary | ICD-10-CM | POA: Diagnosis not present

## 2023-11-15 DIAGNOSIS — I495 Sick sinus syndrome: Secondary | ICD-10-CM | POA: Diagnosis present

## 2023-11-15 DIAGNOSIS — F12920 Cannabis use, unspecified with intoxication, uncomplicated: Secondary | ICD-10-CM | POA: Insufficient documentation

## 2023-11-15 DIAGNOSIS — S2231XA Fracture of one rib, right side, initial encounter for closed fracture: Secondary | ICD-10-CM | POA: Diagnosis not present

## 2023-11-15 DIAGNOSIS — G4733 Obstructive sleep apnea (adult) (pediatric): Secondary | ICD-10-CM | POA: Insufficient documentation

## 2023-11-15 DIAGNOSIS — W19XXXA Unspecified fall, initial encounter: Secondary | ICD-10-CM | POA: Insufficient documentation

## 2023-11-15 DIAGNOSIS — N281 Cyst of kidney, acquired: Secondary | ICD-10-CM | POA: Diagnosis not present

## 2023-11-15 DIAGNOSIS — Z950 Presence of cardiac pacemaker: Secondary | ICD-10-CM | POA: Diagnosis not present

## 2023-11-15 DIAGNOSIS — Z23 Encounter for immunization: Secondary | ICD-10-CM | POA: Insufficient documentation

## 2023-11-15 DIAGNOSIS — S199XXA Unspecified injury of neck, initial encounter: Secondary | ICD-10-CM | POA: Diagnosis not present

## 2023-11-15 DIAGNOSIS — Z043 Encounter for examination and observation following other accident: Secondary | ICD-10-CM | POA: Diagnosis not present

## 2023-11-15 LAB — BASIC METABOLIC PANEL WITH GFR
Anion gap: 8 (ref 5–15)
BUN: 20 mg/dL (ref 8–23)
CO2: 29 mmol/L (ref 22–32)
Calcium: 9.4 mg/dL (ref 8.9–10.3)
Chloride: 101 mmol/L (ref 98–111)
Creatinine, Ser: 1.04 mg/dL (ref 0.61–1.24)
GFR, Estimated: >60 mL/min (ref >60)
Glucose, Bld: 97 mg/dL (ref 70–99)
Potassium: 4.8 mmol/L (ref 3.5–5.1)
Sodium: 139 mmol/L (ref 135–145)

## 2023-11-15 LAB — CBC
HCT: 40.8 % (ref 39.0–52.0)
Hemoglobin: 13.5 g/dL (ref 13.0–17.0)
MCH: 31.8 pg (ref 26.0–34.0)
MCHC: 33.1 g/dL (ref 30.0–36.0)
MCV: 96 fL (ref 80.0–100.0)
Platelets: 145 K/uL — ABNORMAL LOW (ref 150–400)
RBC: 4.25 MIL/uL (ref 4.22–5.81)
RDW: 13.5 % (ref 11.5–15.5)
WBC: 5.8 K/uL (ref 4.0–10.5)
nRBC: 0 % (ref 0.0–0.2)

## 2023-11-15 LAB — TROPONIN T, HIGH SENSITIVITY
Troponin T High Sensitivity: <15 ng/L (ref <19)
Troponin T High Sensitivity: <15 ng/L (ref <19)
Troponin T High Sensitivity: <15 ng/L (ref <19)

## 2023-11-15 MED ORDER — IOHEXOL 350 MG/ML SOLN
100.0000 mL | Freq: Once | INTRAVENOUS | Status: AC | PRN
  Administered 2023-11-15: 75 mL via INTRAVENOUS

## 2023-11-15 MED ORDER — IOHEXOL 350 MG/ML SOLN
100.0000 mL | Freq: Once | INTRAVENOUS | Status: AC | PRN
  Administered 2023-11-15: 85 mL via INTRAVENOUS

## 2023-11-15 MED ORDER — TETANUS-DIPHTH-ACELL PERTUSSIS 5-2.5-18.5 LF-MCG/0.5 IM SUSY
0.5000 mL | PREFILLED_SYRINGE | Freq: Once | INTRAMUSCULAR | Status: AC
  Administered 2023-11-15: 0.5 mL via INTRAMUSCULAR
  Filled 2023-11-15: qty 0.5

## 2023-11-15 MED ORDER — LEVETIRACETAM 500 MG PO TABS
1000.0000 mg | ORAL_TABLET | Freq: Every day | ORAL | Status: DC
  Administered 2023-11-15: 1000 mg via ORAL
  Filled 2023-11-15: qty 2

## 2023-11-15 MED ORDER — MELATONIN 3 MG PO TABS: 3.0000 mg | ORAL_TABLET | Freq: Every evening | ORAL | Status: DC | PRN

## 2023-11-15 MED ORDER — ACETAMINOPHEN 325 MG PO TABS
650.0000 mg | ORAL_TABLET | Freq: Four times a day (QID) | ORAL | Status: DC | PRN
  Administered 2023-11-16: 650 mg via ORAL
  Filled 2023-11-15: qty 2

## 2023-11-15 MED ORDER — PANTOPRAZOLE SODIUM 40 MG IV SOLR
40.0000 mg | INTRAVENOUS | Status: DC
  Administered 2023-11-15: 40 mg via INTRAVENOUS
  Filled 2023-11-15: qty 10

## 2023-11-15 MED ORDER — HYDRALAZINE HCL 20 MG/ML IJ SOLN: 10.0000 mg | INTRAMUSCULAR | Status: DC | PRN

## 2023-11-15 MED ORDER — BENAZEPRIL HCL 5 MG PO TABS
40.0000 mg | ORAL_TABLET | Freq: Every day | ORAL | Status: DC
  Administered 2023-11-16: 40 mg via ORAL
  Filled 2023-11-15: qty 8

## 2023-11-15 MED ORDER — ACETAMINOPHEN 650 MG RE SUPP
650.0000 mg | Freq: Four times a day (QID) | RECTAL | Status: DC | PRN
Start: 2023-11-15 — End: 2023-11-16

## 2023-11-15 MED ORDER — ONDANSETRON HCL 4 MG/2ML IJ SOLN: 4.0000 mg | Freq: Four times a day (QID) | INTRAMUSCULAR | Status: DC | PRN

## 2023-11-15 MED ORDER — DONEPEZIL HCL 5 MG PO TABS
10.0000 mg | ORAL_TABLET | Freq: Every day | ORAL | Status: DC
  Administered 2023-11-15: 10 mg via ORAL
  Filled 2023-11-15: qty 2

## 2023-11-15 MED ORDER — TRAZODONE HCL 100 MG PO TABS
100.0000 mg | ORAL_TABLET | Freq: Every evening | ORAL | Status: DC | PRN
  Administered 2023-11-15: 100 mg via ORAL
  Filled 2023-11-15: qty 1

## 2023-11-15 MED ORDER — ACETAMINOPHEN 500 MG PO TABS
1000.0000 mg | ORAL_TABLET | Freq: Once | ORAL | Status: AC
  Administered 2023-11-15: 1000 mg via ORAL
  Filled 2023-11-15: qty 2

## 2023-11-15 MED ORDER — NITROGLYCERIN 0.4 MG SL SUBL
0.4000 mg | SUBLINGUAL_TABLET | SUBLINGUAL | Status: DC | PRN
  Administered 2023-11-15: 0.4 mg via SUBLINGUAL
  Filled 2023-11-15: qty 1

## 2023-11-15 MED ORDER — HYDROCODONE-ACETAMINOPHEN 5-325 MG PO TABS
1.0000 | ORAL_TABLET | Freq: Once | ORAL | Status: AC
  Administered 2023-11-15: 1 via ORAL
  Filled 2023-11-15: qty 1

## 2023-11-15 MED ORDER — FENTANYL CITRATE PF 50 MCG/ML IJ SOSY
25.0000 ug | PREFILLED_SYRINGE | Freq: Once | INTRAMUSCULAR | Status: AC
  Administered 2023-11-15: 25 ug via INTRAVENOUS
  Filled 2023-11-15: qty 1

## 2023-11-15 MED ORDER — NALOXONE HCL 0.4 MG/ML IJ SOLN: 0.4000 mg | INTRAMUSCULAR | Status: DC | PRN

## 2023-11-15 MED ORDER — ROSUVASTATIN CALCIUM 5 MG PO TABS
2.5000 mg | ORAL_TABLET | Freq: Every evening | ORAL | Status: DC
  Administered 2023-11-15: 2.5 mg via ORAL
  Filled 2023-11-15: qty 1

## 2023-11-15 MED ORDER — LEVETIRACETAM 500 MG PO TABS
500.0000 mg | ORAL_TABLET | Freq: Every day | ORAL | Status: DC
  Administered 2023-11-16: 500 mg via ORAL
  Filled 2023-11-15: qty 1

## 2023-11-15 MED ORDER — LACTATED RINGERS IV BOLUS
1000.0000 mL | Freq: Once | INTRAVENOUS | Status: AC
  Administered 2023-11-15: 1000 mL via INTRAVENOUS

## 2023-11-15 MED ORDER — KETOROLAC TROMETHAMINE 15 MG/ML IJ SOLN
15.0000 mg | Freq: Once | INTRAMUSCULAR | Status: AC
  Administered 2023-11-15: 15 mg via INTRAVENOUS
  Filled 2023-11-15: qty 1

## 2023-11-15 MED ORDER — FENTANYL CITRATE PF 50 MCG/ML IJ SOSY: 25.0000 ug | PREFILLED_SYRINGE | INTRAMUSCULAR | Status: DC | PRN

## 2023-11-15 MED ORDER — FENTANYL CITRATE PF 50 MCG/ML IJ SOSY
50.0000 ug | PREFILLED_SYRINGE | Freq: Once | INTRAMUSCULAR | Status: AC
  Administered 2023-11-15: 50 ug via INTRAVENOUS
  Filled 2023-11-15: qty 1

## 2023-11-15 NOTE — Plan of Care (Signed)

## 2023-11-15 NOTE — Plan of Care (Signed)
 [facility]  Zyan Coby (MRN [id number])  EDP Requesting Transfer: Rocky Massy, MD  Patient is an 83 year old Caucasian male with a history of RLS, TIA, history of essential hypertension who presented to the ED after a fall.  The fall was unwitnessed and he was walking his dog and apparently fell outside.  Corporate investment banker reminding her vital deficits and hold the patient inside and he reported a headache.  He did not recall the events of the fall.  He did subsequently complain of chest pain afterwards. Given his chest discomfort a CTA PE protocol was done and showed no PE. Cardiac Troponin x 2 Negative.  His chest pain persisted radiated to his back and so then subsequently a CTA aortic dissection study was done which showed no dissection.  TRH has been asked to admit this patient for syncope and collapse and he has been accepted to the cardiac telemetry and will need further workup.

## 2023-11-15 NOTE — ED Provider Notes (Signed)
 [place] EMERGENCY DEPARTMENT AT [facility] Provider Note   CSN: [id number] Arrival date & time: 11/15/23  1011     Patient presents with: Fall   [name] [name] is a 83 y.o. male.  {Add pertinent medical, surgical, social history, OB history to HPI:32947} 35 male with past medical history of RLS, TIA, and hypertension presenting to the emergency department today after a fall.  This was an unwitnessed fall.  Patient was walking his dog and apparently fell outside.  Some construction workers nearby noticed this and help patient inside.  He is reporting a headache since then.  He is not on any blood thinners.  Denies any pain or symptoms elsewhere other than the headache.  He does not recall the event.   Fall Associated symptoms include headaches.       Prior to Admission medications   Medication Sig Start Date End Date Taking? Authorizing Provider  Ascorbic Acid (VITAMIN C PO) Take 1,000 mg by mouth 2 (two) times a week.    [provider]  azelastine  (ASTELIN ) 0.1 % nasal spray Place 1 spray into both nostrils 2 (two) times daily. Use in each nostril as directed 11/02/23   Burchette, Wolm ORN, MD  benazepril  (LOTENSIN ) 40 MG tablet TAKE 1 TABLET BY MOUTH EVERY DAY 11/29/20   Burchette, Wolm ORN, MD  cholecalciferol (VITAMIN D3) 25 MCG (1000 UNIT) tablet Take 1,000 Units by mouth daily.    [provider]  donepezil  (ARICEPT ) 10 MG tablet Take 1 tablet (10 mg total) by mouth at bedtime. 09/01/23   Lomax, Amy, NP  levETIRAcetam  (KEPPRA ) 500 MG tablet Take one tablet in am and two tablets in pm. 04/29/21   Rosemarie Eather RAMAN, MD  NON FORMULARY Pt uses a cpap nightly    [provider]  rosuvastatin  (CRESTOR ) 5 MG tablet TAKE 1 TABLET BY MOUTH EVERY DAY Patient taking differently: Take 2.5 mg by mouth every evening. 11/11/20   Burchette, Wolm ORN, MD  traZODone  (DESYREL ) 100 MG tablet Take 1 tablet (100 mg total) by mouth at bedtime as needed. for sleep 10/14/22    Lomax, Amy, NP  Turmeric (QC TUMERIC COMPLEX PO) Take 1 capsule by mouth 2 (two) times a week.    [provider]    Allergies: Patient has no known allergies.    Review of Systems  Neurological:  Positive for headaches.  All other systems reviewed and are negative.   Updated Vital Signs BP (!) 143/72 (BP Location: Left Arm)   Pulse 72   Temp 98 F (36.7 C) (Oral)   Resp 16   SpO2 93%   Physical Exam Vitals and nursing note reviewed.   Gen: NAD Eyes: PERRL, EOMI HEENT: no oropharyngeal swelling Neck: trachea midline, no midline tenderness Resp: clear to auscultation bilaterally Card: RRR, no murmurs, rubs, or gallops Abd: nontender, nondistended Extremities: no calf tenderness, no edema Vascular: 2+ radial pulses bilaterally, 2+ DP pulses bilaterally Skin: Abrasion noted over occipital region, no laceration noted Neuro: Cranial nerves intact, equal strength and sensation throughout bilateral upper and lower extremities Psyc: acting appropriately   (all labs ordered are listed, but only abnormal results are displayed) Labs Reviewed  BASIC METABOLIC PANEL WITH GFR  CBC    EKG: None  Radiology: No results found.  {Document cardiac monitor, telemetry assessment procedure when appropriate:32947} Procedures   Medications Ordered in the ED  acetaminophen  (TYLENOL ) tablet 1,000 mg (has no administration in time range)      {Click  here for ABCD2, HEART and other calculators REFRESH Note before signing:1}                              Medical Decision Making 83 year old male with past medical history of RLS, TIA, and hypertension presenting to the emergency department today with closed head injury and amnesia.  Unsure if this is a syncopal episode or mechanical fall.  Will obtain basic labs here as well as an EKG and keep the patient on the cardiac monitor to evaluate for anemia, electrolyte abnormalities, or arrhythmias.  Will obtain a CT scan of the  patient's head and cervical spine for further evaluation for acute traumatic injuries.  Will update his tetanus.  Will give him Tylenol  for headache.  I will reevaluate for ultimate disposition.  Think that if his workup is unremarkable and he is ambulatory here with no further symptoms that he could potentially be discharged.  The patient did develop some chest pain while he was here.  Troponins are added on.  His labs are largely reassuring.  He did have recurrent chest pain so CT angiogram was also ordered to evaluate for pulmonary embolism given the episode of possible syncope with this.  Given his ongoing symptoms and age plan is for admission for syncope workup.  Amount and/or Complexity of Data Reviewed Labs: ordered. Radiology: ordered.  Risk OTC drugs. Prescription drug management.     {Document critical care time when appropriate  Document review of labs and clinical decision tools ie CHADS2VASC2, etc  Document your independent review of radiology images and any outside records  Document your discussion with family members, caretakers and with consultants  Document social determinants of health affecting pt's care  Document your decision making why or why not admission, treatments were needed:32947:::1}   Final diagnoses:  None    ED Discharge Orders     None

## 2023-11-15 NOTE — H&P (Signed)
 History and Physical      DAVIT VASSAR FMW:[id number] DOB: [birth year] DOA: 11/15/2023; DOS: 11/15/2023  PCP: Micheal Wolm ORN, MD *** Patient coming from: home ***  I have personally briefly reviewed patient's old medical records in [facility] Link  Chief Complaint: ***  HPI: [name] [name] is a 83 y.o. male with medical history significant for *** who is admitted to [facility] on 11/15/2023 with *** after presenting from home*** to [facility] ED complaining of ***.    ***       ***   ED Course:  Vital signs in the ED were notable for the following: ***  Labs were notable for the following: ***  Per my interpretation, EKG in ED demonstrated the following:  ***  Imaging in the ED, per corresponding formal radiology read, was notable for the following:  ***  While in the ED, the following were administered: ***  Subsequently, the patient was admitted  ***  ***red    Review of Systems: As per HPI otherwise 10 point review of systems negative.   Past Medical History:  Diagnosis Date   Bladder tumor    Cancer [facility])    Cardiac pacemaker in situ 01/27/2010   by dr fernande for documented SND,  St Jude dual chamber   GERD (gastroesophageal reflux disease)    History of adenomatous polyp of colon    History of basal cell carcinoma (BCC) excision    forehead and nose area 2015, 2016   History of squamous cell carcinoma excision    excision bilateral temple area  2015, 2016, 2019   History of syncope 2005   2005  w/ recurrent ;  documented sinus node dysfunction,   s/p PPM 2011  (03-20-2022  pt stated many yrs since last syncope)   History of transient ischemic attack (TIA)    Hyperlipidemia    Hypertension    Mild cognitive impairment    Nocturia    OA (osteoarthritis)    OSA on CPAP    followed by dr chalice;   study in epic 06-23-2021 severe complex apnea syndrome,   pt stated usese cpap nightly   Presence of permanent cardiac pacemaker    RLS (restless  legs syndrome)    Rotator cuff tear arthropathy, right    Seizure disorder [facility]) 2021   neurologist--- dr rosemarie;   started 2021 nocturnal seizures;   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, not intractable , without status epilepticus  (03-20-2022  pt stated last seizure 01/ 2023)   Sinus node dysfunction [facility]) 2010   cardiologist--- dr fernande;  hx recurrent syncope;   04/ 2010  loop recorder implant w/ documented SND;   01-27-2010  s/p PPM dual chamber , St Jude   TIA (transient ischemic attack)    Wears glasses    Wears hearing aid in both ears     Past Surgical History:  Procedure Laterality Date   CARDIAC PACEMAKER PLACEMENT  01/27/2010   @[facility]  by dr fernande;   explant loop recorder /  PPM  St Jude dual chamber   COLONOSCOPY WITH PROPOFOL   08/07/2020   dr pyrle   ESOPHAGOGASTRODUODENOSCOPY (EGD) WITH PROPOFOL   02/05/2014   dr pyrtle   LOOP RECORDER IMPLANT  08/17/2008   @[facility]   by dr fernande;   after tilt table study normal;  for recurrent syncope   PPM GENERATOR CHANGEOUT N/A 12/02/2022   Procedure: PPM GENERATOR CHANGEOUT;  Surgeon: fernande Elspeth BROCKS,  MD;  Location: [facility] INVASIVE CV LAB;  Service: Cardiovascular;  Laterality: N/A;   TRANSURETHRAL RESECTION OF BLADDER TUMOR WITH MITOMYCIN -C N/A 03/23/2022   Procedure: TRANSURETHRAL RESECTION OF BLADDER TUMOR WITH POST OPERATIVE GEMCITABINE ;  Surgeon: Matilda Senior, MD;  Location: [facility];  Service: Urology;  Laterality: N/A;  45 MINS   TRANSURETHRAL RESECTION OF BLADDER TUMOR WITH MITOMYCIN -C N/A 05/14/2022   Procedure: TRANSURETHRAL RESECTION OF BLADDER TUMOR WITH GEMCITABINE ;  Surgeon: Matilda Senior, MD;  Location: [facility] ORS;  Service: Urology;  Laterality: N/A;  30 MINS    Social History:  reports that he quit smoking about 43 years ago. His smoking use included cigarettes. He started smoking about 68 years ago. He has never used smokeless tobacco. He reports  current alcohol use of about 14.0 standard drinks of alcohol per week. He reports current drug use. Frequency: 7.00 times per week. Drug: Marijuana.   No Known Allergies  Family History  Problem Relation Age of Onset   Hypertension Mother    Stroke Mother    Colitis Father    Hypertension Father    Other Father        esophageal stricture   Liver disease Father    Alcohol abuse Maternal Aunt    Rectal cancer Neg Hx    Stomach cancer Neg Hx    Esophageal cancer Neg Hx    Colon polyps Neg Hx    Colon cancer Neg Hx     Family history reviewed and not pertinent ***   Prior to Admission medications   Medication Sig Start Date End Date Taking? Authorizing Provider  Ascorbic Acid (VITAMIN C PO) Take 1,000 mg by mouth 2 (two) times a week.    [provider]  azelastine  (ASTELIN ) 0.1 % nasal spray Place 1 spray into both nostrils 2 (two) times daily. Use in each nostril as directed 11/02/23   Burchette, Wolm ORN, MD  benazepril  (LOTENSIN ) 40 MG tablet TAKE 1 TABLET BY MOUTH EVERY DAY 11/29/20   Burchette, Wolm ORN, MD  cholecalciferol (VITAMIN D3) 25 MCG (1000 UNIT) tablet Take 1,000 Units by mouth daily.    [provider]  donepezil  (ARICEPT ) 10 MG tablet Take 1 tablet (10 mg total) by mouth at bedtime. 09/01/23   Lomax, Amy, NP  levETIRAcetam  (KEPPRA ) 500 MG tablet Take one tablet in am and two tablets in pm. 04/29/21   Rosemarie Eather RAMAN, MD  NON FORMULARY Pt uses a cpap nightly    [provider]  rosuvastatin  (CRESTOR ) 5 MG tablet TAKE 1 TABLET BY MOUTH EVERY DAY Patient taking differently: Take 2.5 mg by mouth every evening. 11/11/20   Burchette, Wolm ORN, MD  traZODone  (DESYREL ) 100 MG tablet Take 1 tablet (100 mg total) by mouth at bedtime as needed. for sleep 10/14/22   Lomax, Amy, NP  Turmeric (QC TUMERIC COMPLEX PO) Take 1 capsule by mouth 2 (two) times a week.    [provider]     Objective    Physical Exam: Vitals:   11/15/23 1930 11/15/23  2000 11/15/23 2058 11/15/23 2100  BP: (!) 192/61 (!) 175/109  (!) 169/62  Pulse: (!) 52 74  (!) 59  Resp: (!) 23 19  17   Temp:    97.9 F (36.6 C)  TempSrc:    Oral  SpO2: 100% 100%  100%  Weight:   65.8 kg     General: appears to be stated age; alert, oriented Skin: warm, dry, no rash Head:  AT/[place]  Mouth:  Oral mucosa membranes appear moist, normal dentition Neck: supple; trachea midline Heart:  RRR; did not appreciate any M/R/G Lungs: CTAB, did not appreciate any wheezes, rales, or rhonchi Abdomen: + BS; soft, ND, NT Vascular: 2+ pedal pulses b/l; 2+ radial pulses b/l Extremities: no peripheral edema, no muscle wasting Neuro: strength and sensation intact in upper and lower extremities b/l ***   *** Neuro: 5/5 strength of the proximal and distal flexors and extensors of the upper and lower extremities bilaterally; sensation intact in upper and lower extremities b/l; cranial nerves II through XII grossly intact; no pronator drift; no evidence suggestive of slurred speech, dysarthria, or facial droop; Normal muscle tone. No tremors.  *** Neuro: In the setting of the patient's current mental status and associated inability to follow instructions, unable to perform full neurologic exam at this time.  As such, assessment of strength, sensation, and cranial nerves is limited at this time. Patient noted to spontaneously move all 4 extremities. No tremors.  ***    Labs on Admission: I have personally reviewed following labs and imaging studies  CBC: Recent Labs  Lab 11/15/23 1105  WBC 5.8  HGB 13.5  HCT 40.8  MCV 96.0  PLT 145*   Basic Metabolic Panel: Recent Labs  Lab 11/15/23 1105  NA 139  K 4.8  CL 101  CO2 29  GLUCOSE 97  BUN 20  CREATININE 1.04  CALCIUM  9.4   GFR: Estimated Creatinine Clearance: 52.7 mL/min (by C-G formula based on SCr of 1.04 mg/dL). Liver Function Tests: No results for input(s): AST, ALT, ALKPHOS, BILITOT, PROT, ALBUMIN in the  last 168 hours. No results for input(s): LIPASE, AMYLASE in the last 168 hours. No results for input(s): AMMONIA in the last 168 hours. Coagulation Profile: No results for input(s): INR, PROTIME in the last 168 hours. Cardiac Enzymes: No results for input(s): CKTOTAL, CKMB, CKMBINDEX, TROPONINI in the last 168 hours. BNP (last 3 results) No results for input(s): PROBNP in the last 8760 hours. HbA1C: No results for input(s): HGBA1C in the last 72 hours. CBG: No results for input(s): GLUCAP in the last 168 hours. Lipid Profile: No results for input(s): CHOL, HDL, LDLCALC, TRIG, CHOLHDL, LDLDIRECT in the last 72 hours. Thyroid  Function Tests: No results for input(s): TSH, T4TOTAL, FREET4, T3FREE, THYROIDAB in the last 72 hours. Anemia Panel: No results for input(s): VITAMINB12, FOLATE, FERRITIN, TIBC, IRON, RETICCTPCT in the last 72 hours. Urine analysis: No results found for: COLORURINE, APPEARANCEUR, LABSPEC, PHURINE, GLUCOSEU, HGBUR, BILIRUBINUR, KETONESUR, PROTEINUR, UROBILINOGEN, NITRITE, LEUKOCYTESUR  Radiological Exams on Admission: CT Angio Chest/Abd/Pel for Dissection W and/or Wo Contrast Result Date: 11/15/2023 CLINICAL DATA:  Acute aortic syndrome suspected. Patient fell outside and struck head. EXAM: CT ANGIOGRAPHY CHEST, ABDOMEN AND PELVIS TECHNIQUE: Non-contrast CT of the chest was initially obtained. Multidetector CT imaging through the chest, abdomen and pelvis was performed using the standard protocol during bolus administration of intravenous contrast. Multiplanar reconstructed images and MIPs were obtained and reviewed to evaluate the vascular anatomy. RADIATION DOSE REDUCTION: This exam was performed according to the departmental dose-optimization program which includes automated exposure control, adjustment of the mA and/or kV according to patient size and/or use of iterative reconstruction  technique. CONTRAST:  85mL OMNIPAQUE  IOHEXOL  350 MG/ML SOLN COMPARISON:  CTA chest for pulmonary arteries since 11/15/2023. CT abdomen and pelvis 02/25/2022 FINDINGS: CTA CHEST FINDINGS Cardiovascular: Unenhanced images of the chest demonstrate calcification of the aorta and coronary arteries. Cardiac pacemaker in place. No evidence of acute  intramural hematoma. Images obtained during the arterial phase after contrast material administration demonstrate normal caliber thoracic aorta. No aortic dissection. Great vessel origins are patent. Normal heart size. No pericardial effusions. Central pulmonary arteries are well opacified. No evidence of significant pulmonary embolus. Mediastinum/Nodes: No enlarged mediastinal, hilar, or axillary lymph nodes. Thyroid  gland, trachea, and esophagus demonstrate no significant findings. Lungs/Pleura: Calcified granulomas in the lungs. No significant pulmonary nodules. No airspace disease or consolidation in the lungs. No pleural effusion or pneumothorax. Minimal linear scarring in the right lung base. Musculoskeletal: Degenerative changes in the spine. No acute bony abnormalities. Review of the MIP images confirms the above findings. CTA ABDOMEN AND PELVIS FINDINGS VASCULAR Aorta: Normal caliber abdominal aorta. Atherosclerotic changes with calcific and noncalcific plaque formation. Focal area of ulcerated plaque formation in the mid abdominal aorta. This is unchanged since prior study. No significant stenosis or discrete dissection. Celiac: Patent without evidence of aneurysm, dissection, vasculitis or significant stenosis. SMA: Patent without evidence of aneurysm, dissection, vasculitis or significant stenosis. Renals: Both renal arteries are patent without evidence of aneurysm, dissection, vasculitis, fibromuscular dysplasia or significant stenosis. IMA: Patent without evidence of aneurysm, dissection, vasculitis or significant stenosis. Inflow: Patent without evidence of  aneurysm, dissection, vasculitis or significant stenosis. Veins: No obvious venous abnormality within the limitations of this arterial phase study. Review of the MIP images confirms the above findings. NON-VASCULAR Hepatobiliary: No focal liver abnormality is seen. No gallstones, gallbladder wall thickening, or biliary dilatation. Pancreas: Unremarkable. No pancreatic ductal dilatation or surrounding inflammatory changes. Spleen: Normal in size without focal abnormality. Adrenals/Urinary Tract: No adrenal gland nodules. Nephrograms are symmetrical. No hydronephrosis or hydroureter. Hypoenhancing solid-appearing nodule in the lower pole right kidney measuring 1.9 cm diameter. There is slight enlargement since the previous study. The appearance is indeterminate and could indicate a small renal cell carcinoma or adenoma or enhancing benign lesion. Consider further characterization with MRI or short-term follow-up in 6 months. Bladder is normal. Stomach/Bowel: Stomach, small bowel, and colon are not abnormally distended. No wall thickening or inflammatory changes are seen. Scattered sigmoid colonic diverticula without evidence of acute diverticulitis. Appendix is normal. Lymphatic: No significant lymphadenopathy. Reproductive: Prostate is unremarkable. Other: No abdominal wall hernia or abnormality. No abdominopelvic ascites. Musculoskeletal: Spondylolysis with mild spondylolisthesis at L5-S1. Degenerative changes in the spine. No acute bony abnormalities. Review of the MIP images confirms the above findings. IMPRESSION: 1. Aortic atherosclerosis. No evidence of aneurysm, dissection, or significant stenosis involving the thoracic or abdominal aorta. Focal ulcerating plaques suggested in the abdominal aorta without change. 2. No evidence of active pulmonary disease. 3. 1.9 cm enhancing solid-appearing lesion in the lower pole right kidney. Mild enlargement since previous study. This lesion is indeterminate. Consider  further characterization with elective MRI or six-month follow-up. Electronically Signed   By: Elsie Gravely M.D.   On: 11/15/2023 16:50   CT Angio Chest PE W and/or Wo Contrast Result Date: 11/15/2023 CLINICAL DATA:  Evaluate for pulmonary embolism EXAM: CT ANGIOGRAPHY CHEST WITH CONTRAST TECHNIQUE: Multidetector CT imaging of the chest was performed using the standard protocol during bolus administration of intravenous contrast. Multiplanar CT image reconstructions and MIPs were obtained to evaluate the vascular anatomy. RADIATION DOSE REDUCTION: This exam was performed according to the departmental dose-optimization program which includes automated exposure control, adjustment of the mA and/or kV according to patient size and/or use of iterative reconstruction technique. CONTRAST:  75mL OMNIPAQUE  IOHEXOL  350 MG/ML SOLN COMPARISON:  Chest radiograph November 15, 2023 FINDINGS: Cardiovascular: Satisfactory opacification  of the pulmonary arteries to the segmental level. No evidence of pulmonary embolism. Arteries. Normal heart size. No pericardial effusion. Left chest wall dual lead pacer in place with leads in right atrium and ventricle. Mediastinum/Nodes: No enlarged mediastinal, hilar, or axillary lymph nodes. Thyroid  gland, trachea, and esophagus demonstrate no significant findings. Lungs/Pleura: Mild bronchial and bronchiolar wall thickening. Bibasilar atelectasis. No suspicious pulmonary nodule. No pleural effusion. Upper Abdomen: Right kidney cortical simple cyst which does not require imaging follow-up Musculoskeletal: Bilateral mild gynecomastia. No suspicious osseous lesion. Healed fracture of right sixth rib Review of the MIP images confirms the above findings. IMPRESSION: No suspicious findings to suggest pulmonary embolism. Mild bronchial wall thickening suggestive of medium and large size airway disease. Aortic Atherosclerosis (ICD10-I70.0). Electronically Signed   By: Megan  Zare M.D.   On:  11/15/2023 15:18   CT Head Wo Contrast Result Date: 11/15/2023 CLINICAL DATA:  Trauma, fall, hit head on ground, loss of consciousness. EXAM: CT HEAD WITHOUT CONTRAST CT CERVICAL SPINE WITHOUT CONTRAST TECHNIQUE: Multidetector CT imaging of the head and cervical spine was performed following the standard protocol without intravenous contrast. Multiplanar CT image reconstructions of the cervical spine were also generated. RADIATION DOSE REDUCTION: This exam was performed according to the departmental dose-optimization program which includes automated exposure control, adjustment of the mA and/or kV according to patient size and/or use of iterative reconstruction technique. COMPARISON:  None Available. FINDINGS: CT HEAD FINDINGS Brain: No acute intracranial hemorrhage. No CT evidence of acute infarct. No edema, mass effect, or midline shift. The basilar cisterns are patent. Ventricles: Prominence of the ventricles suggesting underlying parenchymal volume loss. Vascular: Atherosclerotic calcifications of the carotid siphons. No hyperdense vessel. Skull: No acute or aggressive finding. Orbits: Bilateral lens replacement. Sinuses: Mucosal thickening in the left ethmoid sinus. Other: Mastoid air cells are clear. CT CERVICAL SPINE FINDINGS Alignment: There is mild straightening of the normal lumbar lordosis. Trace anterolisthesis of T1 on T2 and T2 on T3. No facet subluxation or dislocation. Skull base and vertebrae: No compression fracture or displaced fracture in the cervical spine. No suspicious osseous lesion. Degenerative endplate changes at multiple levels. Soft tissues and spinal canal: No prevertebral fluid or swelling. No visible canal hematoma. Disc levels: Moderate to severe disc space narrowing at C5-6 and severe disc space narrowing at C6-7 and C7-T1. Degenerative endplate osteophytes at multiple levels. There are small disc osteophyte complexes at multiple levels in the cervical spine. Mild spinal canal  stenosis at C5-6. Facet arthrosis and uncovertebral hypertrophy at multiple levels. Moderate to severe foraminal narrowing at multiple levels. Upper chest: Negative. Other: None IMPRESSION: No CT evidence of acute intracranial abnormality. No acute fracture or traumatic malalignment of the cervical spine. Degenerative changes as above. Electronically Signed   By: Donnice Mania M.D.   On: 11/15/2023 12:18   CT Cervical Spine Wo Contrast Result Date: 11/15/2023 CLINICAL DATA:  Trauma, fall, hit head on ground, loss of consciousness. EXAM: CT HEAD WITHOUT CONTRAST CT CERVICAL SPINE WITHOUT CONTRAST TECHNIQUE: Multidetector CT imaging of the head and cervical spine was performed following the standard protocol without intravenous contrast. Multiplanar CT image reconstructions of the cervical spine were also generated. RADIATION DOSE REDUCTION: This exam was performed according to the departmental dose-optimization program which includes automated exposure control, adjustment of the mA and/or kV according to patient size and/or use of iterative reconstruction technique. COMPARISON:  None Available. FINDINGS: CT HEAD FINDINGS Brain: No acute intracranial hemorrhage. No CT evidence of acute infarct. No edema, mass  effect, or midline shift. The basilar cisterns are patent. Ventricles: Prominence of the ventricles suggesting underlying parenchymal volume loss. Vascular: Atherosclerotic calcifications of the carotid siphons. No hyperdense vessel. Skull: No acute or aggressive finding. Orbits: Bilateral lens replacement. Sinuses: Mucosal thickening in the left ethmoid sinus. Other: Mastoid air cells are clear. CT CERVICAL SPINE FINDINGS Alignment: There is mild straightening of the normal lumbar lordosis. Trace anterolisthesis of T1 on T2 and T2 on T3. No facet subluxation or dislocation. Skull base and vertebrae: No compression fracture or displaced fracture in the cervical spine. No suspicious osseous lesion. Degenerative  endplate changes at multiple levels. Soft tissues and spinal canal: No prevertebral fluid or swelling. No visible canal hematoma. Disc levels: Moderate to severe disc space narrowing at C5-6 and severe disc space narrowing at C6-7 and C7-T1. Degenerative endplate osteophytes at multiple levels. There are small disc osteophyte complexes at multiple levels in the cervical spine. Mild spinal canal stenosis at C5-6. Facet arthrosis and uncovertebral hypertrophy at multiple levels. Moderate to severe foraminal narrowing at multiple levels. Upper chest: Negative. Other: None IMPRESSION: No CT evidence of acute intracranial abnormality. No acute fracture or traumatic malalignment of the cervical spine. Degenerative changes as above. Electronically Signed   By: Donnice Mania M.D.   On: 11/15/2023 12:18   DG Chest Portable 1 View Result Date: 11/15/2023 CLINICAL DATA:  Fall, pulse ox 93%. EXAM: PORTABLE CHEST 1 VIEW COMPARISON:  01/27/2010. FINDINGS: Bilateral lung fields are clear. Bilateral costophrenic angles are clear. Stable cardio-mediastinal silhouette. There is a left sided 2-lead pacemaker. No acute osseous abnormalities. Old healed right clavicular fracture noted. The soft tissues are within normal limits. IMPRESSION: No active disease. Electronically Signed   By: Ree Molt M.D.   On: 11/15/2023 12:15      Assessment/Plan   Principal Problem:   Syncope   ***            ***                  ***                   ***                  ***                  ***                  ***                    #) Right kidney lesion: Today's CTA chest, abdomen, pelvis with dissection protocol showed evidence of a 1.9 cm enhancing solid-appearing lesion in the lower pole of the right kidney, which, per radiology report, was associated with mild enlargement relative to that seen on CT  abdomen/pelvis performed on 02/25/2022.  Per radiology, this lesion is felt to be indeterminate, with radiology conveying options for elective MRI versus 42-month follow-up scan.   Plan: will convey radiology's options for follow-up, including elective MRI versus 85-month follow-up screening.                 ***                  ***                  ***                  ***                 ***                ***  DVT prophylaxis: SCD's ***  Code Status: Full code*** Family Communication: none*** Disposition Plan: Per Rounding Team Consults called: none***;  Admission status: ***     I SPENT GREATER THAN 75 *** MINUTES IN CLINICAL CARE TIME/MEDICAL DECISION-MAKING IN COMPLETING THIS ADMISSION.      Eva NOVAK Sora Olivo DO [facility]  From 7PM - 7AM   11/15/2023, 9:18 PM   ***

## 2023-11-15 NOTE — ED Triage Notes (Signed)
 States fell outside and hit head over ground. + LOC. No memory of event. Denies thinners.   Lac to posterior head, R elbow, R wrist.

## 2023-11-16 ENCOUNTER — Observation Stay (HOSPITAL_BASED_OUTPATIENT_CLINIC_OR_DEPARTMENT_OTHER)

## 2023-11-16 ENCOUNTER — Ambulatory Visit (HOSPITAL_COMMUNITY)
Admission: RE | Admit: 2023-11-16 | Discharge: 2023-11-16 | Disposition: A | Discharge status: Home / Self Care | Source: Ambulatory Visit | Attending: Family Medicine | Admitting: Family Medicine

## 2023-11-16 ENCOUNTER — Encounter (HOSPITAL_COMMUNITY): Payer: Self-pay

## 2023-11-16 DIAGNOSIS — R55 Syncope and collapse: Secondary | ICD-10-CM

## 2023-11-16 DIAGNOSIS — Z87898 Personal history of other specified conditions: Secondary | ICD-10-CM | POA: Diagnosis not present

## 2023-11-16 DIAGNOSIS — I495 Sick sinus syndrome: Secondary | ICD-10-CM | POA: Diagnosis not present

## 2023-11-16 DIAGNOSIS — E782 Mixed hyperlipidemia: Secondary | ICD-10-CM | POA: Diagnosis not present

## 2023-11-16 DIAGNOSIS — G4733 Obstructive sleep apnea (adult) (pediatric): Secondary | ICD-10-CM | POA: Diagnosis not present

## 2023-11-16 DIAGNOSIS — W19XXXA Unspecified fall, initial encounter: Secondary | ICD-10-CM | POA: Insufficient documentation

## 2023-11-16 DIAGNOSIS — N289 Disorder of kidney and ureter, unspecified: Secondary | ICD-10-CM | POA: Insufficient documentation

## 2023-11-16 DIAGNOSIS — R0789 Other chest pain: Secondary | ICD-10-CM | POA: Insufficient documentation

## 2023-11-16 DIAGNOSIS — I10 Essential (primary) hypertension: Secondary | ICD-10-CM | POA: Diagnosis not present

## 2023-11-16 LAB — CBC WITH DIFFERENTIAL/PLATELET
Abs Immature Granulocytes: 0.02 K/uL (ref 0.00–0.07)
Basophils Absolute: 0 K/uL (ref 0.0–0.1)
Basophils Relative: 0 %
Eosinophils Absolute: 0.1 K/uL (ref 0.0–0.5)
Eosinophils Relative: 2 %
HCT: 37.8 % — ABNORMAL LOW (ref 39.0–52.0)
Hemoglobin: 13 g/dL (ref 13.0–17.0)
Immature Granulocytes: 0 %
Lymphocytes Relative: 26 %
Lymphs Abs: 1.8 K/uL (ref 0.7–4.0)
MCH: 32.3 pg (ref 26.0–34.0)
MCHC: 34.4 g/dL (ref 30.0–36.0)
MCV: 93.8 fL (ref 80.0–100.0)
Monocytes Absolute: 0.6 K/uL (ref 0.1–1.0)
Monocytes Relative: 9 %
Neutro Abs: 4.2 K/uL (ref 1.7–7.7)
Neutrophils Relative %: 63 %
Platelets: 140 K/uL — ABNORMAL LOW (ref 150–400)
RBC: 4.03 MIL/uL — ABNORMAL LOW (ref 4.22–5.81)
RDW: 13.4 % (ref 11.5–15.5)
WBC: 6.8 K/uL (ref 4.0–10.5)
nRBC: 0 % (ref 0.0–0.2)

## 2023-11-16 LAB — COMPREHENSIVE METABOLIC PANEL WITH GFR
ALT: 18 U/L (ref 0–44)
AST: 21 U/L (ref 15–41)
Albumin: 3.2 g/dL — ABNORMAL LOW (ref 3.5–5.0)
Alkaline Phosphatase: 39 U/L (ref 38–126)
Anion gap: 10 (ref 5–15)
BUN: 17 mg/dL (ref 8–23)
CO2: 26 mmol/L (ref 22–32)
Calcium: 8.6 mg/dL — ABNORMAL LOW (ref 8.9–10.3)
Chloride: 101 mmol/L (ref 98–111)
Creatinine, Ser: 0.9 mg/dL (ref 0.61–1.24)
GFR, Estimated: >60 mL/min (ref >60)
Glucose, Bld: 92 mg/dL (ref 70–99)
Potassium: 3.7 mmol/L (ref 3.5–5.1)
Sodium: 137 mmol/L (ref 135–145)
Total Bilirubin: 0.8 mg/dL (ref 0.0–1.2)
Total Protein: 5.7 g/dL — ABNORMAL LOW (ref 6.5–8.1)

## 2023-11-16 LAB — ECHOCARDIOGRAM COMPLETE
AR max vel: 2.11 cm2
AV Area VTI: 2.31 cm2
AV Area mean vel: 2.15 cm2
AV Mean grad: 5 mmHg
AV Peak grad: 10.5 mmHg
Ao pk vel: 1.62 m/s
Area-P 1/2: 3.79 cm2
Height: 67 in
P 1/2 time: 536 ms
S' Lateral: 2.8 cm
Weight: 2324.53 [oz_av]

## 2023-11-16 LAB — LIPASE, BLOOD: Lipase: 152 U/L — ABNORMAL HIGH (ref 11–51)

## 2023-11-16 LAB — MAGNESIUM
Magnesium: 2.1 mg/dL (ref 1.7–2.4)
Magnesium: 2.1 mg/dL (ref 1.7–2.4)

## 2023-11-16 LAB — TROPONIN I (HIGH SENSITIVITY): Troponin I (High Sensitivity): 4 ng/L (ref <18)

## 2023-11-16 MED ORDER — ACETAMINOPHEN 325 MG PO TABS: 650.0000 mg | ORAL_TABLET | Freq: Four times a day (QID) | ORAL | Status: AC | PRN

## 2023-11-16 MED ORDER — PANTOPRAZOLE SODIUM 40 MG PO TBEC
40.0000 mg | DELAYED_RELEASE_TABLET | Freq: Every day | ORAL | Status: DC
  Administered 2023-11-16: 40 mg via ORAL
  Filled 2023-11-16: qty 1

## 2023-11-16 NOTE — Plan of Care (Signed)

## 2023-11-16 NOTE — Assessment & Plan Note (Signed)
 Continue cpap.

## 2023-11-16 NOTE — Discharge Summary (Signed)
 Physician Discharge Summary   Patient: [name] [name] MRN: [id number] DOB: 10/20/1940  Admit date:     11/15/2023  Discharge date: 11/16/23  Discharge Physician: Elidia Sieving Beauden Tremont   PCP: Micheal Wolm ORN, MD   Recommendations at discharge:    Patient was diagnosed with vasovagal syncope, ruled out arrhythmias.  Follow up with Dr Micheal in 7 to 10 days.   Discharge Diagnoses: Principal Problem:   Syncope Active Problems:   HLD (hyperlipidemia)   Essential hypertension   OSA on CPAP   Atypical chest pain   Fall   Kidney lesion, native, right   History of seizures  Resolved Problems:   * No resolved hospital problems. [facility] Course: [name] [name] was admitted to the hospital with the working diagnosis of syncope.   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  Patient loss his consciousness while walking his dog during the morning hours. The ambient temperature was elevated. While walking he felt extreme pain in his right knee, right before the syncope episode. He was able to put his right hand down, before falling to the ground.  After recovering consciousness he noticed chest pain.  On his initial physical examination his blood pressure was 192/61, HR 52, RR 23 and 02 saturation 100%  Lungs with no wheezing or rhonchi, heart with S1 and S2 present and regular, abdomen with no distention and no lower extremity edema, neurologically non focal.   Na 139, K 4,8 Cl 101 bicarbonate 29 glucose 97 bun 20 cr 1,0  High sensitive troponin < 15, < 15 , < 15  Wbc 5.8 hgb 13.5 plt 145   Chest radiograph with mild cardiomegaly, hyperinflation, no infiltrates or effusions, pacemaker in place with right atrial and right ventricle lead.   EKG 53 bpm, normal axis, normal intervals, qtc 402, no significant ST segment or T wave changes.   CT head and  cervical spine, no evidence of acute intracranial changes, no acute fracture or traumatic malalignment of the cervical spine.   CT chest with no suspicious findings to suggest pulmonary embolism. Bibasilar atelectasis.  CT angio chest, abdomen and pelvis with aortic atherosclerosis, no evidence of aneurysm, dissection or significant stenosis involving the thoracic or abdominal aorta.  1.9 cm enhancing solid appearing lesion in the lower pole right kidney. Indeterminate lesion, (recommendation elective MRI or follow up in 6 months).   Assessment and Plan: * Syncope, vasovagal Likely vasovagal syncope, related to acute knee pain and walking outdoors under high temperatures.   Further work up with echocardiogram, showed normal LV systolic function with EF 60 to 65%, mild asymmetric LVH of the basal septal segment. RV systolic function is normal, mild enlargement RA and LA, no significant valvular disease.   Pacemaker interrogation with no arrhythmias that will explain syncope.   Plan to follow up as outpatient.,   Essential hypertension Continue blood pressure control with benazepril .   Sinus node dysfunction (HCC) Sp pacemaker implantation.  Telemetry with atrial and ventricular sensing.  11/2022 his generator was replaced with no complications.        HLD (hyperlipidemia) Continue statin therapy   OSA on CPAP Continue cpap  History of seizures No active seizures Plan to continue levetiracetam .       Consultants: none  Procedures performed: none   Disposition: Home Diet recommendation:  Cardiac diet DISCHARGE MEDICATION: Allergies as of 11/16/2023  No Known Allergies      Medication List     TAKE these medications    acetaminophen  325 MG tablet Commonly known as: TYLENOL  Take 2 tablets (650 mg total) by mouth every 6 (six) hours as needed for moderate pain (pain score 4-6).   azelastine  0.1 % nasal spray Commonly known as: ASTELIN  Place 1 spray into both  nostrils 2 (two) times daily. Use in each nostril as directed   benazepril  40 MG tablet Commonly known as: LOTENSIN  TAKE 1 TABLET BY MOUTH EVERY DAY   calcium  citrate 950 (200 Ca) MG tablet Commonly known as: CALCITRATE - dosed in mg elemental calcium  Take 200 mg of elemental calcium  by mouth daily.   cholecalciferol 25 MCG (1000 UNIT) tablet Commonly known as: VITAMIN D3 Take 1,000 Units by mouth daily.   donepezil  10 MG tablet Commonly known as: ARICEPT  Take 1 tablet (10 mg total) by mouth at bedtime.   levETIRAcetam  500 MG tablet Commonly known as: Keppra  Take one tablet in am and two tablets in pm. What changed:  how much to take how to take this when to take this additional instructions   NON FORMULARY Pt uses a cpap nightly   QC TUMERIC COMPLEX PO Take 2 capsules by mouth daily.   rosuvastatin  5 MG tablet Commonly known as: CRESTOR  TAKE 1 TABLET BY MOUTH EVERY DAY What changed:  how much to take when to take this   traZODone  100 MG tablet Commonly known as: DESYREL  Take 1 tablet (100 mg total) by mouth at bedtime as needed. for sleep   VITAMIN C PO Take 1,000 mg by mouth daily.        Discharge Exam: Filed Weights   11/15/23 2058 11/16/23 0417  Weight: 65.8 kg 65.9 kg   BP (!) 145/59 (BP Location: Left Arm)   Pulse 72   Temp 98 F (36.7 C) (Oral)   Resp 19   Ht 5' 7 (1.702 m)   Wt 65.9 kg   SpO2 100%   BMI 22.75 kg/m   Patient with no chest pain and no dyspnea, no further syncope episode.   Neurology awake and alert ENT with no pallor or icterus Cardiovascular with S1 and S2 present and regular with no gallops, rubs or murmurs Respiratory with no rales or wheezing, no rhonchi  Abdomen with no distention  No lower extremity edema   Condition at discharge: stable  The results of significant diagnostics from this hospitalization (including imaging, microbiology, ancillary and laboratory) are listed below for reference.   Imaging  Studies: ECHOCARDIOGRAM COMPLETE Result Date: 11/16/2023    ECHOCARDIOGRAM REPORT   Patient Name:   [name] [name] [facility] Date of Exam: 11/16/2023 Medical Rec #:  [id number]      Height:       67.0 in Accession #:    [id number]     Weight:       145.3 lb Date of Birth:  24-Jun-1940     BSA:          1.765 m Patient Age:    82 years       BP:           145/9 mmHg Patient Gender: M              HR:           57 bpm. Exam Location:  Inpatient Procedure: 2D Echo, Cardiac Doppler, Color Doppler, 3D Echo and Strain Analysis            (  Both Spectral and Color Flow Doppler were utilized during            procedure). Indications:    Syncope  History:        Patient has no prior history of Echocardiogram examinations.                 Pacemaker; Risk Factors:Hypertension, Dyslipidemia, Sleep Apnea                 and Former Smoker.  Sonographer:    Therisa Crouch Referring Phys: [id number] JUSTIN B HOWERTER IMPRESSIONS  1. Left ventricular ejection fraction, by estimation, is 60 to 65%. Left ventricular ejection fraction by 3D volume is 61 %. The left ventricle has normal function. The left ventricle has no regional wall motion abnormalities. There is mild asymmetric left ventricular hypertrophy of the basal-septal segment. Left ventricular diastolic parameters were normal. The average left ventricular global longitudinal strain is -20.2 %. The global longitudinal strain is normal.  2. Right ventricular systolic function is normal. The right ventricular size is moderately enlarged.  3. Left atrial size was mildly dilated.  4. Right atrial size was mildly dilated.  5. The mitral valve is normal in structure. No evidence of mitral valve regurgitation. No evidence of mitral stenosis.  6. The aortic valve is tricuspid. Aortic valve regurgitation is mild. Aortic valve sclerosis is present, with no evidence of aortic valve stenosis.  7. The inferior vena cava is normal in size with greater than 50% respiratory variability, suggesting right  atrial pressure of 3 mmHg. Comparison(s): No prior Echocardiogram. FINDINGS  Left Ventricle: Left ventricular ejection fraction, by estimation, is 60 to 65%. Left ventricular ejection fraction by 3D volume is 61 %. The left ventricle has normal function. The left ventricle has no regional wall motion abnormalities. The average left ventricular global longitudinal strain is -20.2 %. Strain was performed and the global longitudinal strain is normal. The left ventricular internal cavity size was normal in size. There is mild asymmetric left ventricular hypertrophy of the basal-septal segment. Left ventricular diastolic parameters were normal. Right Ventricle: The right ventricular size is moderately enlarged. No increase in right ventricular wall thickness. Right ventricular systolic function is normal. Left Atrium: Left atrial size was mildly dilated. Right Atrium: Right atrial size was mildly dilated. Pericardium: There is no evidence of pericardial effusion. Mitral Valve: The mitral valve is normal in structure. No evidence of mitral valve regurgitation. No evidence of mitral valve stenosis. Tricuspid Valve: The tricuspid valve is normal in structure. Tricuspid valve regurgitation is mild . No evidence of tricuspid stenosis. Aortic Valve: The aortic valve is tricuspid. Aortic valve regurgitation is mild. Aortic regurgitation PHT measures 536 msec. Aortic valve sclerosis is present, with no evidence of aortic valve stenosis. Aortic valve mean gradient measures 5.0 mmHg. Aortic valve peak gradient measures 10.5 mmHg. Aortic valve area, by VTI measures 2.31 cm. Pulmonic Valve: The pulmonic valve was normal in structure. Pulmonic valve regurgitation is mild. No evidence of pulmonic stenosis. Aorta: The aortic root and ascending aorta are structurally normal, with no evidence of dilitation. Venous: The inferior vena cava is normal in size with greater than 50% respiratory variability, suggesting right atrial pressure of  3 mmHg. IAS/Shunts: The atrial septum is grossly normal. Additional Comments: 3D was performed not requiring image post processing on an independent workstation and was normal. A device lead is visualized.  LEFT VENTRICLE PLAX 2D LVIDd:         4.00 cm  Diastology LVIDs:         2.80 cm         LV e' medial:    8.55 cm/s LV PW:         1.00 cm         LV E/e' medial:  8.0 LV IVS:        1.20 cm         LV e' lateral:   10.60 cm/s LVOT diam:     2.00 cm         LV E/e' lateral: 6.4 LV SV:         80 LV SV Index:   45              2D Longitudinal LVOT Area:     3.14 cm        Strain                                2D Strain GLS   -20.2 %                                Avg:                                 3D Volume EF                                LV 3D EF:    Left                                             ventricul                                             ar                                             ejection                                             fraction                                             by 3D                                             volume is  61 %.                                LV 3D EDV:   107.07 ml                                LV 3D ESV:   41.62 ml                                 3D Volume EF:                                3D EF:        61 % RIGHT VENTRICLE             IVC RV Basal diam:  4.70 cm     IVC diam: 2.00 cm RV S prime:     13.20 cm/s LEFT ATRIUM             Index        RIGHT ATRIUM           Index LA diam:        3.00 cm 1.70 cm/m   RA Area:     22.20 cm LA Vol (A2C):   78.8 ml 44.64 ml/m  RA Volume:   75.90 ml  42.99 ml/m LA Vol (A4C):   58.7 ml 33.25 ml/m LA Biplane Vol: 70.4 ml 39.88 ml/m  AORTIC VALVE AV Area (Vmax):    2.11 cm AV Area (Vmean):   2.15 cm AV Area (VTI):     2.31 cm AV Vmax:           162.00 cm/s AV Vmean:          107.000 cm/s AV VTI:            0.346 m AV Peak Grad:      10.5 mmHg AV Mean  Grad:      5.0 mmHg LVOT Vmax:         109.00 cm/s LVOT Vmean:        73.300 cm/s LVOT VTI:          0.254 m LVOT/AV VTI ratio: 0.73 AI PHT:            536 msec  AORTA Ao Root diam: 3.60 cm Ao Asc diam:  2.90 cm MITRAL VALVE               TRICUSPID VALVE MV Area (PHT): 3.79 cm    TR Peak grad:   28.9 mmHg MV Decel Time: 200 msec    TR Vmax:        269.00 cm/s MV E velocity: 68.10 cm/s MV A velocity: 80.10 cm/s  SHUNTS MV E/A ratio:  0.85        Systemic VTI:  0.25 m                            Systemic Diam: 2.00 cm Stanly Leavens MD Electronically signed by Stanly Leavens MD Signature Date/Time: 11/16/2023/3:04:07 PM    Final    CT Angio Chest/Abd/Pel for Dissection W and/or Wo Contrast Result Date: 11/15/2023 CLINICAL DATA:  Acute aortic syndrome suspected. Patient fell outside and struck  head. EXAM: CT ANGIOGRAPHY CHEST, ABDOMEN AND PELVIS TECHNIQUE: Non-contrast CT of the chest was initially obtained. Multidetector CT imaging through the chest, abdomen and pelvis was performed using the standard protocol during bolus administration of intravenous contrast. Multiplanar reconstructed images and MIPs were obtained and reviewed to evaluate the vascular anatomy. RADIATION DOSE REDUCTION: This exam was performed according to the departmental dose-optimization program which includes automated exposure control, adjustment of the mA and/or kV according to patient size and/or use of iterative reconstruction technique. CONTRAST:  85mL OMNIPAQUE  IOHEXOL  350 MG/ML SOLN COMPARISON:  CTA chest for pulmonary arteries since 11/15/2023. CT abdomen and pelvis 02/25/2022 FINDINGS: CTA CHEST FINDINGS Cardiovascular: Unenhanced images of the chest demonstrate calcification of the aorta and coronary arteries. Cardiac pacemaker in place. No evidence of acute intramural hematoma. Images obtained during the arterial phase after contrast material administration demonstrate normal caliber thoracic aorta. No aortic dissection.  Great vessel origins are patent. Normal heart size. No pericardial effusions. Central pulmonary arteries are well opacified. No evidence of significant pulmonary embolus. Mediastinum/Nodes: No enlarged mediastinal, hilar, or axillary lymph nodes. Thyroid  gland, trachea, and esophagus demonstrate no significant findings. Lungs/Pleura: Calcified granulomas in the lungs. No significant pulmonary nodules. No airspace disease or consolidation in the lungs. No pleural effusion or pneumothorax. Minimal linear scarring in the right lung base. Musculoskeletal: Degenerative changes in the spine. No acute bony abnormalities. Review of the MIP images confirms the above findings. CTA ABDOMEN AND PELVIS FINDINGS VASCULAR Aorta: Normal caliber abdominal aorta. Atherosclerotic changes with calcific and noncalcific plaque formation. Focal area of ulcerated plaque formation in the mid abdominal aorta. This is unchanged since prior study. No significant stenosis or discrete dissection. Celiac: Patent without evidence of aneurysm, dissection, vasculitis or significant stenosis. SMA: Patent without evidence of aneurysm, dissection, vasculitis or significant stenosis. Renals: Both renal arteries are patent without evidence of aneurysm, dissection, vasculitis, fibromuscular dysplasia or significant stenosis. IMA: Patent without evidence of aneurysm, dissection, vasculitis or significant stenosis. Inflow: Patent without evidence of aneurysm, dissection, vasculitis or significant stenosis. Veins: No obvious venous abnormality within the limitations of this arterial phase study. Review of the MIP images confirms the above findings. NON-VASCULAR Hepatobiliary: No focal liver abnormality is seen. No gallstones, gallbladder wall thickening, or biliary dilatation. Pancreas: Unremarkable. No pancreatic ductal dilatation or surrounding inflammatory changes. Spleen: Normal in size without focal abnormality. Adrenals/Urinary Tract: No adrenal gland  nodules. Nephrograms are symmetrical. No hydronephrosis or hydroureter. Hypoenhancing solid-appearing nodule in the lower pole right kidney measuring 1.9 cm diameter. There is slight enlargement since the previous study. The appearance is indeterminate and could indicate a small renal cell carcinoma or adenoma or enhancing benign lesion. Consider further characterization with MRI or short-term follow-up in 6 months. Bladder is normal. Stomach/Bowel: Stomach, small bowel, and colon are not abnormally distended. No wall thickening or inflammatory changes are seen. Scattered sigmoid colonic diverticula without evidence of acute diverticulitis. Appendix is normal. Lymphatic: No significant lymphadenopathy. Reproductive: Prostate is unremarkable. Other: No abdominal wall hernia or abnormality. No abdominopelvic ascites. Musculoskeletal: Spondylolysis with mild spondylolisthesis at L5-S1. Degenerative changes in the spine. No acute bony abnormalities. Review of the MIP images confirms the above findings. IMPRESSION: 1. Aortic atherosclerosis. No evidence of aneurysm, dissection, or significant stenosis involving the thoracic or abdominal aorta. Focal ulcerating plaques suggested in the abdominal aorta without change. 2. No evidence of active pulmonary disease. 3. 1.9 cm enhancing solid-appearing lesion in the lower pole right kidney. Mild enlargement since previous study. This lesion is indeterminate.  Consider further characterization with elective MRI or six-month follow-up. Electronically Signed   By: Elsie Gravely M.D.   On: 11/15/2023 16:50   CT Angio Chest PE W and/or Wo Contrast Result Date: 11/15/2023 CLINICAL DATA:  Evaluate for pulmonary embolism EXAM: CT ANGIOGRAPHY CHEST WITH CONTRAST TECHNIQUE: Multidetector CT imaging of the chest was performed using the standard protocol during bolus administration of intravenous contrast. Multiplanar CT image reconstructions and MIPs were obtained to evaluate the  vascular anatomy. RADIATION DOSE REDUCTION: This exam was performed according to the departmental dose-optimization program which includes automated exposure control, adjustment of the mA and/or kV according to patient size and/or use of iterative reconstruction technique. CONTRAST:  75mL OMNIPAQUE  IOHEXOL  350 MG/ML SOLN COMPARISON:  Chest radiograph November 15, 2023 FINDINGS: Cardiovascular: Satisfactory opacification of the pulmonary arteries to the segmental level. No evidence of pulmonary embolism. Arteries. Normal heart size. No pericardial effusion. Left chest wall dual lead pacer in place with leads in right atrium and ventricle. Mediastinum/Nodes: No enlarged mediastinal, hilar, or axillary lymph nodes. Thyroid  gland, trachea, and esophagus demonstrate no significant findings. Lungs/Pleura: Mild bronchial and bronchiolar wall thickening. Bibasilar atelectasis. No suspicious pulmonary nodule. No pleural effusion. Upper Abdomen: Right kidney cortical simple cyst which does not require imaging follow-up Musculoskeletal: Bilateral mild gynecomastia. No suspicious osseous lesion. Healed fracture of right sixth rib Review of the MIP images confirms the above findings. IMPRESSION: No suspicious findings to suggest pulmonary embolism. Mild bronchial wall thickening suggestive of medium and large size airway disease. Aortic Atherosclerosis (ICD10-I70.0). Electronically Signed   By: Megan  Zare M.D.   On: 11/15/2023 15:18   CT Head Wo Contrast Result Date: 11/15/2023 CLINICAL DATA:  Trauma, fall, hit head on ground, loss of consciousness. EXAM: CT HEAD WITHOUT CONTRAST CT CERVICAL SPINE WITHOUT CONTRAST TECHNIQUE: Multidetector CT imaging of the head and cervical spine was performed following the standard protocol without intravenous contrast. Multiplanar CT image reconstructions of the cervical spine were also generated. RADIATION DOSE REDUCTION: This exam was performed according to the departmental dose-optimization  program which includes automated exposure control, adjustment of the mA and/or kV according to patient size and/or use of iterative reconstruction technique. COMPARISON:  None Available. FINDINGS: CT HEAD FINDINGS Brain: No acute intracranial hemorrhage. No CT evidence of acute infarct. No edema, mass effect, or midline shift. The basilar cisterns are patent. Ventricles: Prominence of the ventricles suggesting underlying parenchymal volume loss. Vascular: Atherosclerotic calcifications of the carotid siphons. No hyperdense vessel. Skull: No acute or aggressive finding. Orbits: Bilateral lens replacement. Sinuses: Mucosal thickening in the left ethmoid sinus. Other: Mastoid air cells are clear. CT CERVICAL SPINE FINDINGS Alignment: There is mild straightening of the normal lumbar lordosis. Trace anterolisthesis of T1 on T2 and T2 on T3. No facet subluxation or dislocation. Skull base and vertebrae: No compression fracture or displaced fracture in the cervical spine. No suspicious osseous lesion. Degenerative endplate changes at multiple levels. Soft tissues and spinal canal: No prevertebral fluid or swelling. No visible canal hematoma. Disc levels: Moderate to severe disc space narrowing at C5-6 and severe disc space narrowing at C6-7 and C7-T1. Degenerative endplate osteophytes at multiple levels. There are small disc osteophyte complexes at multiple levels in the cervical spine. Mild spinal canal stenosis at C5-6. Facet arthrosis and uncovertebral hypertrophy at multiple levels. Moderate to severe foraminal narrowing at multiple levels. Upper chest: Negative. Other: None IMPRESSION: No CT evidence of acute intracranial abnormality. No acute fracture or traumatic malalignment of the cervical spine. Degenerative changes  as above. Electronically Signed   By: Donnice Mania M.D.   On: 11/15/2023 12:18   CT Cervical Spine Wo Contrast Result Date: 11/15/2023 CLINICAL DATA:  Trauma, fall, hit head on ground, loss of  consciousness. EXAM: CT HEAD WITHOUT CONTRAST CT CERVICAL SPINE WITHOUT CONTRAST TECHNIQUE: Multidetector CT imaging of the head and cervical spine was performed following the standard protocol without intravenous contrast. Multiplanar CT image reconstructions of the cervical spine were also generated. RADIATION DOSE REDUCTION: This exam was performed according to the departmental dose-optimization program which includes automated exposure control, adjustment of the mA and/or kV according to patient size and/or use of iterative reconstruction technique. COMPARISON:  None Available. FINDINGS: CT HEAD FINDINGS Brain: No acute intracranial hemorrhage. No CT evidence of acute infarct. No edema, mass effect, or midline shift. The basilar cisterns are patent. Ventricles: Prominence of the ventricles suggesting underlying parenchymal volume loss. Vascular: Atherosclerotic calcifications of the carotid siphons. No hyperdense vessel. Skull: No acute or aggressive finding. Orbits: Bilateral lens replacement. Sinuses: Mucosal thickening in the left ethmoid sinus. Other: Mastoid air cells are clear. CT CERVICAL SPINE FINDINGS Alignment: There is mild straightening of the normal lumbar lordosis. Trace anterolisthesis of T1 on T2 and T2 on T3. No facet subluxation or dislocation. Skull base and vertebrae: No compression fracture or displaced fracture in the cervical spine. No suspicious osseous lesion. Degenerative endplate changes at multiple levels. Soft tissues and spinal canal: No prevertebral fluid or swelling. No visible canal hematoma. Disc levels: Moderate to severe disc space narrowing at C5-6 and severe disc space narrowing at C6-7 and C7-T1. Degenerative endplate osteophytes at multiple levels. There are small disc osteophyte complexes at multiple levels in the cervical spine. Mild spinal canal stenosis at C5-6. Facet arthrosis and uncovertebral hypertrophy at multiple levels. Moderate to severe foraminal narrowing at  multiple levels. Upper chest: Negative. Other: None IMPRESSION: No CT evidence of acute intracranial abnormality. No acute fracture or traumatic malalignment of the cervical spine. Degenerative changes as above. Electronically Signed   By: Donnice Mania M.D.   On: 11/15/2023 12:18   DG Chest Portable 1 View Result Date: 11/15/2023 CLINICAL DATA:  Fall, pulse ox 93%. EXAM: PORTABLE CHEST 1 VIEW COMPARISON:  01/27/2010. FINDINGS: Bilateral lung fields are clear. Bilateral costophrenic angles are clear. Stable cardio-mediastinal silhouette. There is a left sided 2-lead pacemaker. No acute osseous abnormalities. Old healed right clavicular fracture noted. The soft tissues are within normal limits. IMPRESSION: No active disease. Electronically Signed   By: Ree Molt M.D.   On: 11/15/2023 12:15    Microbiology: Results for orders placed or performed during the hospital encounter of 01/27/10  Surgical pcr screen     Status: None   Collection Time: 01/27/10  9:15 AM  Result Value Ref Range Status   MRSA, PCR NEGATIVE NEGATIVE Final   Staphylococcus aureus  NEGATIVE Final    NEGATIVE        The Xpert SA Assay (FDA approved for NASAL specimens only), is one component of a comprehensive surveillance program.  It is not intended to diagnose infection nor to guide or monitor treatment.    Labs: CBC: Recent Labs  Lab 11/15/23 1105 11/16/23 0306  WBC 5.8 6.8  NEUTROABS  --  4.2  HGB 13.5 13.0  HCT 40.8 37.8*  MCV 96.0 93.8  PLT 145* 140*   Basic Metabolic Panel: Recent Labs  Lab 11/15/23 1105 11/16/23 0306 11/16/23 0307  NA 139 137  --   K 4.8 3.7  --  CL 101 101  --   CO2 29 26  --   GLUCOSE 97 92  --   BUN 20 17  --   CREATININE 1.04 0.90  --   CALCIUM  9.4 8.6*  --   MG  --  2.1 2.1   Liver Function Tests: Recent Labs  Lab 11/16/23 0306  AST 21  ALT 18  ALKPHOS 39  BILITOT 0.8  PROT 5.7*  ALBUMIN 3.2*   CBG: No results for input(s): GLUCAP in the last 168  hours.  Discharge time spent: greater than 30 minutes.  Signed: Elidia Toribio Furnace, MD [facility] 11/16/2023

## 2023-11-16 NOTE — Progress Notes (Signed)
   11/16/23 1425  TOC Brief Assessment  Insurance and Status Reviewed  Patient has primary care physician Yes  Home environment has been reviewed home w/ spouse  Prior level of function: independent  Prior/Current Home Services No current home services  Social Drivers of Health Review SDOH reviewed no interventions necessary  Readmission risk has been reviewed Yes  Transition of care needs no transition of care needs at this time    Echo pending, may discharge home later today.

## 2023-11-16 NOTE — Assessment & Plan Note (Signed)
 No active seizures Plan to continue levetiracetam .

## 2023-11-16 NOTE — ED Provider Notes (Signed)
 Received care of pt from Dr. Ula. Will admit with concern for syncope and chest pain.   O my evaluation has worsening pain now in the thoracic back, appears uncomfortable.  Will order specific dissection study in setting of symptoms.  CTA without dissection> Recommend MRI to evaluate right kidney lesion as outpt or 6 mos follow up.   Will continue plan for admission for syncope work up and chest pain.    Dreama Longs, MD 11/16/23 1032

## 2023-11-16 NOTE — Plan of Care (Signed)
  Problem: Education: Goal: Knowledge of General Education information will improve Description: Including pain rating scale, medication(s)/side effects and non-pharmacologic comfort measures 11/16/2023 1552 by Terryl Lauraine LABOR, RN Outcome: Adequate for Discharge 11/16/2023 1522 by Terryl Lauraine LABOR, RN Outcome: Progressing   Problem: Health Behavior/Discharge Planning: Goal: Ability to manage health-related needs will improve 11/16/2023 1552 by Terryl Lauraine LABOR, RN Outcome: Adequate for Discharge 11/16/2023 1522 by Terryl Lauraine LABOR, RN Outcome: Progressing   Problem: Clinical Measurements: Goal: Ability to maintain clinical measurements within normal limits will improve 11/16/2023 1552 by Terryl Lauraine LABOR, RN Outcome: Adequate for Discharge 11/16/2023 1522 by Terryl Lauraine LABOR, RN Outcome: Progressing Goal: Will remain free from infection 11/16/2023 1552 by Terryl Lauraine LABOR, RN Outcome: Adequate for Discharge 11/16/2023 1522 by Terryl Lauraine LABOR, RN Outcome: Progressing Goal: Diagnostic test results will improve 11/16/2023 1552 by Terryl Lauraine LABOR, RN Outcome: Adequate for Discharge 11/16/2023 1522 by Terryl Lauraine LABOR, RN Outcome: Progressing Goal: Respiratory complications will improve 11/16/2023 1552 by Terryl Lauraine LABOR, RN Outcome: Adequate for Discharge 11/16/2023 1522 by Terryl Lauraine LABOR, RN Outcome: Progressing Goal: Cardiovascular complication will be avoided 11/16/2023 1552 by Terryl Lauraine LABOR, RN Outcome: Adequate for Discharge 11/16/2023 1522 by Terryl Lauraine LABOR, RN Outcome: Progressing   Problem: Activity: Goal: Risk for activity intolerance will decrease 11/16/2023 1552 by Terryl Lauraine LABOR, RN Outcome: Adequate for Discharge 11/16/2023 1522 by Terryl Lauraine LABOR, RN Outcome: Progressing   Problem: Nutrition: Goal: Adequate nutrition will be maintained 11/16/2023 1552 by Terryl Lauraine LABOR, RN Outcome: Adequate for Discharge 11/16/2023 1522 by Terryl Lauraine LABOR, RN Outcome: Progressing    Problem: Coping: Goal: Level of anxiety will decrease 11/16/2023 1552 by Terryl Lauraine LABOR, RN Outcome: Adequate for Discharge 11/16/2023 1522 by Terryl Lauraine LABOR, RN Outcome: Progressing   Problem: Elimination: Goal: Will not experience complications related to bowel motility 11/16/2023 1552 by Terryl Lauraine LABOR, RN Outcome: Adequate for Discharge 11/16/2023 1522 by Terryl Lauraine LABOR, RN Outcome: Progressing Goal: Will not experience complications related to urinary retention 11/16/2023 1552 by Terryl Lauraine LABOR, RN Outcome: Adequate for Discharge 11/16/2023 1522 by Terryl Lauraine LABOR, RN Outcome: Progressing   Problem: Pain Managment: Goal: General experience of comfort will improve and/or be controlled 11/16/2023 1552 by Terryl Lauraine LABOR, RN Outcome: Adequate for Discharge 11/16/2023 1522 by Terryl Lauraine LABOR, RN Outcome: Progressing   Problem: Safety: Goal: Ability to remain free from injury will improve 11/16/2023 1552 by Terryl Lauraine LABOR, RN Outcome: Adequate for Discharge 11/16/2023 1522 by Terryl Lauraine LABOR, RN Outcome: Progressing   Problem: Skin Integrity: Goal: Risk for impaired skin integrity will decrease 11/16/2023 1552 by Terryl Lauraine LABOR, RN Outcome: Adequate for Discharge 11/16/2023 1522 by Terryl Lauraine LABOR, RN Outcome: Progressing

## 2023-11-16 NOTE — Assessment & Plan Note (Signed)
 Continue blood pressure control with benazepril .

## 2023-11-16 NOTE — Progress Notes (Signed)
 Heart Failure Navigator Progress Note  Assessed for Heart & Vascular TOC clinic readiness.  Patient does not meet criteria due to EF 60-65%, admitted with  vasovagal syncope, ruled out arrhythmias.Per MD will follow up with Dr. Micheal in 7-10 days. No HF TOC.     Navigator will sign off at this time.   Stephane Haddock, BSN, Scientist, clinical (histocompatibility and immunogenetics) Only

## 2023-11-16 NOTE — Progress Notes (Signed)
 PHARMACIST - PHYSICIAN COMMUNICATION  DR:   Noralee  CONCERNING: IV to Oral Route Change Policy  RECOMMENDATION: This patient is receiving Protonix  by the intravenous route.  Based on criteria approved by the Pharmacy and Therapeutics Committee, the intravenous medication(s) is/are being converted to the equivalent oral dose form(s).   DESCRIPTION: These criteria include: The patient is eating (either orally or via tube) and/or has been taking other orally administered medications for a least 24 hours The patient has no evidence of active gastrointestinal bleeding or impaired GI absorption (gastrectomy, short bowel, patient on TNA or NPO).  If you have questions about this conversion, please contact the Pharmacy Department  []   [phone] )  [facility] []   [phone] )  [facility] [x]   [phone] )  [facility] []   [phone] )  [facility] []   [phone] )  [facility] Lionel   Benedetta Elvan HECKLE, PharmD, BCPS Clinical Pharmacist 11/16/2023 1:54 PM  Contact: [phone] after 3 PM

## 2023-11-16 NOTE — Assessment & Plan Note (Signed)
 Continue statin therapy.

## 2023-11-16 NOTE — Assessment & Plan Note (Signed)
 Likely vasovagal syncope, related to acute knee pain and walking outdoors under high temperatures.   Further work up with echocardiogram, showed normal LV systolic function with EF 60 to 65%, mild asymmetric LVH of the basal septal segment. RV systolic function is normal, mild enlargement RA and LA, no significant valvular disease.   Pacemaker interrogation with no arrhythmias that will explain syncope.   Plan to follow up as outpatient.,

## 2023-11-16 NOTE — Hospital Course (Addendum)
 [name] [name] was admitted to the hospital with the working diagnosis of syncope.   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  Patient loss his consciousness while walking his dog during the morning hours. The ambient temperature was elevated. While walking he felt extreme pain in his right knee, right before the syncope episode. He was able to put his right hand down, before falling to the ground.  After recovering consciousness he noticed chest pain.  On his initial physical examination his blood pressure was 192/61, HR 52, RR 23 and 02 saturation 100%  Lungs with no wheezing or rhonchi, heart with S1 and S2 present and regular, abdomen with no distention and no lower extremity edema, neurologically non focal.   Na 139, K 4,8 Cl 101 bicarbonate 29 glucose 97 bun 20 cr 1,0  High sensitive troponin < 15, < 15 , < 15  Wbc 5.8 hgb 13.5 plt 145   Chest radiograph with mild cardiomegaly, hyperinflation, no infiltrates or effusions, pacemaker in place with right atrial and right ventricle lead.   EKG 53 bpm, normal axis, normal intervals, qtc 402, no significant ST segment or T wave changes.   CT head and cervical spine, no evidence of acute intracranial changes, no acute fracture or traumatic malalignment of the cervical spine.   CT chest with no suspicious findings to suggest pulmonary embolism. Bibasilar atelectasis.  CT angio chest, abdomen and pelvis with aortic atherosclerosis, no evidence of aneurysm, dissection or significant stenosis involving the thoracic or abdominal aorta.  1.9 cm enhancing solid appearing lesion in the lower pole right kidney. Indeterminate lesion, (recommendation elective MRI or follow up in 6 months).

## 2023-11-16 NOTE — Assessment & Plan Note (Addendum)
 Sp pacemaker implantation.  Telemetry with atrial and ventricular sensing.  11/2022 his generator was replaced with no complications.

## 2023-11-17 ENCOUNTER — Telehealth: Payer: Self-pay

## 2023-11-17 NOTE — Transitions of Care (Post Inpatient/ED Visit) (Unsigned)
   11/17/2023  Name: DAYVEON HALLEY MRN: [id number] DOB: [birth year]  Today's TOC FU Call Status: Today's TOC FU Call Status:: Unsuccessful Call (1st Attempt) Unsuccessful Call (1st Attempt) Date: 11/17/23  Attempted to reach the patient regarding the most recent Inpatient/ED visit.  Follow Up Plan: Additional outreach attempts will be made to reach the patient to complete the Transitions of Care (Post Inpatient/ED visit) call.   Signature Julian Lemmings, LPN [facility] Nurse Health Advisor Direct Dial [phone]

## 2023-11-18 ENCOUNTER — Telehealth: Payer: Self-pay | Admitting: *Deleted

## 2023-11-18 ENCOUNTER — Telehealth: Payer: Self-pay | Admitting: Family Medicine

## 2023-11-18 ENCOUNTER — Ambulatory Visit (INDEPENDENT_AMBULATORY_CARE_PROVIDER_SITE_OTHER)

## 2023-11-18 ENCOUNTER — Ambulatory Visit: Admitting: Sports Medicine

## 2023-11-18 VITALS — BP 110/60 | HR 57 | Ht 67.0 in

## 2023-11-18 DIAGNOSIS — G8929 Other chronic pain: Secondary | ICD-10-CM

## 2023-11-18 DIAGNOSIS — M1711 Unilateral primary osteoarthritis, right knee: Secondary | ICD-10-CM | POA: Diagnosis not present

## 2023-11-18 DIAGNOSIS — M25561 Pain in right knee: Secondary | ICD-10-CM

## 2023-11-18 NOTE — Progress Notes (Signed)
 Ben Tascha Casares D.CLEMENTEEN [facility] [address] [place] [address] Phone: [phone]   Assessment and Plan:     1. Chronic pain of right knee (Primary) 2. Primary osteoarthritis of right knee -Chronic with exacerbation, initial sports medicine visit - Recurrent flare of right knee pain after an episode where her right knee gave out and patient fell on 11/15/2023.  Suspect flare of meniscal pathology with history of meniscal tear versus presyncope/syncopal episode from walking in the heat - Reassuring the patient was evaluated in ER with no acute fracture or other acute pathology based on CT head, C-spine, chest abdomen pelvis - X-ray obtained in clinic.  My interpretation: No acute fracture or dislocation.  Mild degenerative changes -Patient elected for intra-articular CSI.  Tolerated well per note below.  CSI may temporarily increase blood pressure in patient past medical history of hypertension - Start HEP for knee  Procedure: Knee Joint Injection Side: Right Indication: Knee instability and pain  Risks explained and consent was given verbally. The site was cleaned with alcohol prep. A needle was introduced with an anterio-lateral approach. Injection given using 2mL of 1% lidocaine  without epinephrine and 1mL of kenalog  40mg /ml. This was well tolerated.  Needle was removed, hemostasis achieved, and post injection instructions were explained.   Pt was advised to call or return to clinic if these symptoms worsen or fail to improve as anticipated.    15 additional minutes spent for educating Therapeutic Home Exercise Program.  This included exercises focusing on stretching, strengthening, with focus on eccentric aspects.   Long term goals include an improvement in range of motion, strength, endurance as well as avoiding reinjury. Patient's frequency would include in 1-2 times a day, 3-5 times a week for a duration of 6-12 weeks. Proper technique shown and  discussed handout in great detail with ATC.  All questions were discussed and answered.    Pertinent previous records reviewed include ER notes 11/16/2023, CT head, C-spine, chest abdomen pelvis, x-rays left shoulder 11/15/2023  Follow Up: 3 to 4 weeks for reevaluation.  Could discuss advanced imaging if no improvement in knee pain.  Could further discuss neck pain   Subjective:   I, [name] [name] am a scribe for Dr. Leonce.    Chief Complaint: right knee pain   HPI:   11/18/23 Patient is a 83 year old male with right knee pain. Patient states knee buckled and he went down on the ground while walking the dog. Pain does not wake him up at night nor does it keep him from sleeping. Hit the back of his head when he went down on his left side. Think he might have bruised a rib as well.   Duration? Monday morning Did you have an Injury to cause this pain?yes Taking Medication for pain? Tylenol , ice Numbness or Tingling?no Does the pain Radiate? no Altered gait or use?yes ROM/ impairment of movement?yes   Relevant Historical Information: GERD, hypertension, history of bladder cancer  Additional pertinent review of systems negative.   Current Outpatient Medications:    acetaminophen  (TYLENOL ) 325 MG tablet, Take 2 tablets (650 mg total) by mouth every 6 (six) hours as needed for moderate pain (pain score 4-6)., Disp: , Rfl:    Ascorbic Acid (VITAMIN C PO), Take 1,000 mg by mouth daily., Disp: , Rfl:    azelastine  (ASTELIN ) 0.1 % nasal spray, Place 1 spray into both nostrils 2 (two) times daily. Use in each nostril as directed, Disp: 30  mL, Rfl: 11   benazepril  (LOTENSIN ) 40 MG tablet, TAKE 1 TABLET BY MOUTH EVERY DAY, Disp: 90 tablet, Rfl: 2   calcium  citrate (CALCITRATE - DOSED IN MG ELEMENTAL CALCIUM ) 950 (200 Ca) MG tablet, Take 200 mg of elemental calcium  by mouth daily., Disp: , Rfl:    cholecalciferol (VITAMIN D3) 25 MCG (1000 UNIT) tablet, Take 1,000 Units by mouth daily., Disp:  , Rfl:    donepezil  (ARICEPT ) 10 MG tablet, Take 1 tablet (10 mg total) by mouth at bedtime., Disp: 90 tablet, Rfl: 2   levETIRAcetam  (KEPPRA ) 500 MG tablet, Take one tablet in am and two tablets in pm. (Patient taking differently: Take 500-1,000 mg by mouth See admin instructions. Take one tablet by mouth in the morning and then take two tablets by mouth in the evening), Disp: 270 tablet, Rfl: 3   NON FORMULARY, Pt uses a cpap nightly, Disp: , Rfl:    rosuvastatin  (CRESTOR ) 5 MG tablet, TAKE 1 TABLET BY MOUTH EVERY DAY (Patient taking differently: Take 2.5 mg by mouth every evening.), Disp: 90 tablet, Rfl: 3   traZODone  (DESYREL ) 100 MG tablet, Take 1 tablet (100 mg total) by mouth at bedtime as needed. for sleep, Disp: 90 tablet, Rfl: 3   Turmeric (QC TUMERIC COMPLEX PO), Take 2 capsules by mouth daily., Disp: , Rfl:    Objective:     Vitals:   11/18/23 1258  BP: 110/60  Pulse: (!) 57  SpO2: 99%  Height: 5' 7 (1.702 m)      Body mass index is 22.75 kg/m.    Physical Exam:    General:  awake, alert oriented, no acute distress nontoxic Skin: no suspicious lesions or rashes Neuro:sensation intact and strength 5/5 with no deficits, no atrophy, normal muscle tone Psych: No signs of anxiety, depression or other mood disorder  Right knee: Crepitus present No swelling No deformity Neg fluid wave, joint milking ROM Flex 110, Ext 0 TTPmedial fem condyle, lat fem condyle, patella, plica, patella tendon, tibial tuberostiy, NTTP over the quad tendon,  fibular head, posterior fossa, pes anserine bursa, gerdy's tubercle, medial jt line, lateral jt line Neg anterior and posterior drawer  Gait antalgic, favoring left leg   Electronically signed by:  Odis Mace D.[facility] 1:21 PM 11/18/23

## 2023-11-18 NOTE — Telephone Encounter (Signed)
 Pt seen today by Dr. Leonce for his knee, he fell on Monday.  Pt called [facility] to schedule Cervical MRI and per Melissa, this order is no longer needed due to all the CT imaging that was done at [facility] on 11/15/2023 after his fall.  Pt would like to confirm if Dr. Claudene still needs the MRI or not.

## 2023-11-18 NOTE — Transitions of Care (Post Inpatient/ED Visit) (Signed)
 11/18/2023  Name: [name] [name] MRN: [id number] DOB: October 13, 1940  Today's TOC FU Call Status: Today's TOC FU Call Status:: Successful TOC FU Call Completed Unsuccessful Call (1st Attempt) Date: 11/17/23 [facility] FU Call Complete Date: 11/18/23 Patient's Name and Date of Birth confirmed.  Transition Care Management Follow-up Telephone Call Date of Discharge: 11/16/23 [facility]charge Facility: [facility]) Type of Discharge: Inpatient Admission Primary Inpatient Discharge Diagnosis:: syncope How have you been since you were released from the hospital?: Better Any questions or concerns?: No  Items Reviewed: Did you receive and understand the discharge instructions provided?: Yes Medications obtained,verified, and reconciled?: Yes (Medications Reviewed) Any new allergies since your discharge?: No Dietary orders reviewed?: Yes Do you have support at home?: Yes People in Home [RPT]: spouse  Medications Reviewed Today: Medications Reviewed Today     Reviewed by Emmitt Pan, LPN (Licensed Practical Nurse) on 11/18/23 at 1244  Med List Status: <None>   Medication Order Taking? Sig Documenting Provider Last Dose Status Informant  acetaminophen  (TYLENOL ) 325 MG tablet [id number] Yes Take 2 tablets (650 mg total) by mouth every 6 (six) hours as needed for moderate pain (pain score 4-6). Arrien, Elidia Sieving, MD  Active   Ascorbic Acid (VITAMIN C PO) [id number] Yes Take 1,000 mg by mouth daily. [provider]  Active Self  azelastine  (ASTELIN ) 0.1 % nasal spray [id number] Yes Place 1 spray into both nostrils 2 (two) times daily. Use in each nostril as directed Micheal Wolm ORN, MD  Active Self  benazepril  (LOTENSIN ) 40 MG tablet [id number] Yes TAKE 1 TABLET BY MOUTH EVERY DAY Burchette, Wolm ORN, MD  Active Self  calcium  citrate (CALCITRATE - DOSED IN MG ELEMENTAL CALCIUM ) 950 (200 Ca) MG tablet [id number] Yes Take 200 mg of elemental calcium  by mouth daily. [provider]   Active Self  cholecalciferol (VITAMIN D3) 25 MCG (1000 UNIT) tablet [id number] Yes Take 1,000 Units by mouth daily. [provider]  Active Self  donepezil  (ARICEPT ) 10 MG tablet [id number] Yes Take 1 tablet (10 mg total) by mouth at bedtime. Lomax, Amy, NP  Active Self  levETIRAcetam  (KEPPRA ) 500 MG tablet [id number] Yes Take one tablet in am and two tablets in pm.  Patient taking differently: Take 500-1,000 mg by mouth See admin instructions. Take one tablet by mouth in the morning and then take two tablets by mouth in the evening   Rosemarie Eather RAMAN, MD  Active Self  NON FORMULARY [id number] Yes Pt uses a cpap nightly [provider]  Active Self  rosuvastatin  (CRESTOR ) 5 MG tablet [id number] Yes TAKE 1 TABLET BY MOUTH EVERY DAY  Patient taking differently: Take 2.5 mg by mouth every evening.   Micheal Wolm ORN, MD  Active Self  traZODone  (DESYREL ) 100 MG tablet [id number] Yes Take 1 tablet (100 mg total) by mouth at bedtime as needed. for sleep Lomax, Amy, NP  Active Self  Turmeric (QC TUMERIC COMPLEX PO) [id number] Yes Take 2 capsules by mouth daily. [provider]  Active Self            Home Care and Equipment/Supplies: Were Home Health Services Ordered?: NA Any new equipment or medical supplies ordered?: NA  Functional Questionnaire: Do you need assistance with bathing/showering or dressing?: No Do you need assistance with meal preparation?: No Do you need assistance with eating?: No Do you have difficulty maintaining continence: No Do you need assistance with getting out of bed/getting out of a chair/moving?: No Do you have difficulty managing or  taking your medications?: No  Follow up appointments reviewed: PCP Follow-up appointment confirmed?: Yes Date of PCP follow-up appointment?: 11/22/23 Follow-up Provider: [facility] Follow-up appointment confirmed?: NA Do you need transportation to your follow-up appointment?: No Do you  understand care options if your condition(s) worsen?: Yes-patient verbalized understanding    SIGNATURE Julian Lemmings, LPN [facility] Nurse Health Advisor Direct Dial [phone]

## 2023-11-18 NOTE — Telephone Encounter (Signed)
 Copied from CRM [id number]. Topic: General - Other >> Nov 18, 2023  8:59 AM [name] [name] wrote: Reason for CRM: Patient called in to return call to [facility]. To provide update regarding ED visit.

## 2023-11-18 NOTE — Patient Instructions (Addendum)
 Knee HEP. Injected Right knee today. Recommend contacting the Imaging Center and asking about the MRI.  Follow up in 3 to 4 weeks with Dr. Leonce or Dr. Claudene.

## 2023-11-19 NOTE — Telephone Encounter (Signed)
 Patient would like to proceed with epidural injection.

## 2023-11-22 ENCOUNTER — Ambulatory Visit: Admitting: Family Medicine

## 2023-11-22 ENCOUNTER — Encounter: Payer: Self-pay | Admitting: Family Medicine

## 2023-11-22 ENCOUNTER — Other Ambulatory Visit: Payer: Self-pay

## 2023-11-22 VITALS — BP 128/60 | HR 57 | Temp 97.8°F | Wt 152.8 lb

## 2023-11-22 DIAGNOSIS — Z87898 Personal history of other specified conditions: Secondary | ICD-10-CM | POA: Diagnosis not present

## 2023-11-22 DIAGNOSIS — M542 Cervicalgia: Secondary | ICD-10-CM

## 2023-11-22 DIAGNOSIS — I10 Essential (primary) hypertension: Secondary | ICD-10-CM

## 2023-11-22 DIAGNOSIS — D3001 Benign neoplasm of right kidney: Secondary | ICD-10-CM | POA: Diagnosis not present

## 2023-11-22 DIAGNOSIS — C672 Malignant neoplasm of lateral wall of bladder: Secondary | ICD-10-CM | POA: Diagnosis not present

## 2023-11-22 DIAGNOSIS — R55 Syncope and collapse: Secondary | ICD-10-CM | POA: Diagnosis not present

## 2023-11-22 DIAGNOSIS — R8289 Other abnormal findings on cytological and histological examination of urine: Secondary | ICD-10-CM | POA: Diagnosis not present

## 2023-11-22 NOTE — Progress Notes (Signed)
 Established Patient Office Visit  Subjective   Patient ID: [name] [name], male    DOB: [birth year]  Age: 83 y.o. MRN: [id number]  Chief Complaint  Patient presents with   Hospitalization Follow-up    HPI   [name] [name] is seen today for hospital follow-up following recent syncopal episode which occurred while he was out walking his dog.  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  He was doing well the morning of 7-28 when he got up.  He went out to walk his dog and it was very warm outside and humid.  He felt sudden sharp pain in his knee and recalls feeling very dizzy.  He started to go down and was able to get his right hand and elbow down before falling to the ground.  Noticed some chest pain after recovering consciousness.  Fall apparently witnessed by some construction workers who assisted him.  Was taken to the ER and initial blood pressure 192/61 with heart rate of 52.  O2 sats 100%.  Electrolytes stable.  Troponin is negative.  Chest x-ray showed no acute findings.  EKG no acute findings.  CT head and cervical spine no bleed no fracture.  CT chest no evidence for pulmonary embolus.  He had CT angiogram chest abdomen pelvis with no evidence for aneurysm or dissection.  1.9 cm enhancing solid lesion lower pole right kidney.  He actually saw urologist just earlier today and they compared with prior imaging and states this had not grown any over time and they had no concerns  Echocardiogram revealed EF of 60 to 65% with mild asymmetric LVH of the basal septal segment.  No major valvular disease.  Pacemaker interrogation with no arrhythmias  No suspicion of recent seizure activity.  Was felt like he likely had a vasovagal syncope.  No episodes since then.  He admits to not drinking a lot of fluids in the morning  Past Medical History:  Diagnosis Date   Bladder tumor    Cancer [facility])     Cardiac pacemaker in situ 01/27/2010   by dr fernande for documented SND,  St Jude dual chamber   GERD (gastroesophageal reflux disease)    History of adenomatous polyp of colon    History of basal cell carcinoma (BCC) excision    forehead and nose area 2015, 2016   History of squamous cell carcinoma excision    excision bilateral temple area  2015, 2016, 2019   History of syncope 2005   2005  w/ recurrent ;  documented sinus node dysfunction,   s/p PPM 2011  (03-20-2022  pt stated many yrs since last syncope)   History of transient ischemic attack (TIA)    Hyperlipidemia    Hypertension    Mild cognitive impairment    Nocturia    OA (osteoarthritis)    OSA on CPAP    followed by dr chalice;   study in epic 06-23-2021 severe complex apnea syndrome,   pt stated usese cpap nightly   Presence of permanent cardiac pacemaker    RLS (restless legs syndrome)    Rotator cuff tear arthropathy, right    Seizure disorder [facility]) 2021   neurologist--- dr rosemarie;   started 2021 nocturnal seizures;   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, not intractable , without status epilepticus  (03-20-2022  pt stated last seizure 01/ 2023)   Sinus node dysfunction (HCC)  2010   cardiologist--- dr fernande;  hx recurrent syncope;   04/ 2010  loop recorder implant w/ documented SND;   01-27-2010  s/p PPM dual chamber , St Jude   TIA (transient ischemic attack)    Wears glasses    Wears hearing aid in both ears    Past Surgical History:  Procedure Laterality Date   CARDIAC PACEMAKER PLACEMENT  01/27/2010   @[facility]  by dr fernande;   explant loop recorder /  PPM  St Jude dual chamber   COLONOSCOPY WITH PROPOFOL   08/07/2020   dr pyrle   ESOPHAGOGASTRODUODENOSCOPY (EGD) WITH PROPOFOL   02/05/2014   dr pyrtle   LOOP RECORDER IMPLANT  08/17/2008   @[facility]   by dr fernande;   after tilt table study normal;  for recurrent syncope   PPM GENERATOR CHANGEOUT N/A 12/02/2022    Procedure: PPM GENERATOR CHANGEOUT;  Surgeon: fernande Elspeth BROCKS, MD;  Location: [facility] INVASIVE CV LAB;  Service: Cardiovascular;  Laterality: N/A;   TRANSURETHRAL RESECTION OF BLADDER TUMOR WITH MITOMYCIN -C N/A 03/23/2022   Procedure: TRANSURETHRAL RESECTION OF BLADDER TUMOR WITH POST OPERATIVE GEMCITABINE ;  Surgeon: Matilda Senior, MD;  Location: [facility];  Service: Urology;  Laterality: N/A;  45 MINS   TRANSURETHRAL RESECTION OF BLADDER TUMOR WITH MITOMYCIN -C N/A 05/14/2022   Procedure: TRANSURETHRAL RESECTION OF BLADDER TUMOR WITH GEMCITABINE ;  Surgeon: Matilda Senior, MD;  Location: [facility];  Service: Urology;  Laterality: N/A;  30 MINS    reports that he quit smoking about 43 years ago. His smoking use included cigarettes. He started smoking about 68 years ago. He has never used smokeless tobacco. He reports current alcohol use of about 14.0 standard drinks of alcohol per week. He reports current drug use. Frequency: 7.00 times per week. Drug: Marijuana. family history includes Alcohol abuse in his maternal aunt; Colitis in his father; Hypertension in his father and mother; Liver disease in his father; Other in his father; Stroke in his mother. No Known Allergies  Review of Systems  Constitutional:  Negative for fever and malaise/fatigue.  Eyes:  Negative for blurred vision.  Respiratory:  Negative for cough and shortness of breath.   Cardiovascular:  Negative for chest pain, orthopnea and leg swelling.  Genitourinary:  Negative for dysuria.  Neurological:  Negative for dizziness, weakness and headaches.       See HPI      Objective:     BP 128/60 (BP Location: Left Arm, Cuff Size: Normal)   Pulse (!) 57   Temp 97.8 F (36.6 C) (Oral)   Wt 152 lb 12.8 oz (69.3 kg)   SpO2 97%   BMI 23.93 kg/m  BP Readings from Last 3 Encounters:  11/22/23 128/60  11/18/23 110/60  11/16/23 (!) 147/76   Wt Readings from Last 3 Encounters:  11/22/23 152 lb 12.8 oz (69.3 kg)   11/16/23 145 lb 4.5 oz (65.9 kg)  11/09/23 149 lb (67.6 kg)      Physical Exam Vitals reviewed.  Constitutional:      Appearance: He is well-developed.  HENT:     Right Ear: External ear normal.     Left Ear: External ear normal.  Eyes:     Pupils: Pupils are equal, round, and reactive to light.  Neck:     Thyroid : No thyromegaly.  Cardiovascular:     Rate and Rhythm: Normal rate and regular rhythm.  Pulmonary:     Effort: Pulmonary effort is normal. No respiratory distress.     Breath  sounds: Normal breath sounds. No wheezing or rales.  Musculoskeletal:     Cervical back: Neck supple.     Comments: Brace on right knee.  No peripheral edema.  Neurological:     General: No focal deficit present.     Mental Status: He is alert and oriented to person, place, and time.     Cranial Nerves: No cranial nerve deficit.     Motor: No weakness.     Coordination: Coordination normal.     Gait: Gait normal.      No results found for any visits on 11/22/23.  Last CBC Lab Results  Component Value Date   WBC 6.8 11/16/2023   HGB 13.0 11/16/2023   HCT 37.8 (L) 11/16/2023   MCV 93.8 11/16/2023   MCH 32.3 11/16/2023   RDW 13.4 11/16/2023   PLT 140 (L) 11/16/2023   Last metabolic panel Lab Results  Component Value Date   GLUCOSE 92 11/16/2023   NA 137 11/16/2023   K 3.7 11/16/2023   CL 101 11/16/2023   CO2 26 11/16/2023   BUN 17 11/16/2023   CREATININE 0.90 11/16/2023   GFRNONAA >60 11/16/2023   CALCIUM  8.6 (L) 11/16/2023   PROT 5.7 (L) 11/16/2023   ALBUMIN 3.2 (L) 11/16/2023   BILITOT 0.8 11/16/2023   ALKPHOS 39 11/16/2023   AST 21 11/16/2023   ALT 18 11/16/2023   ANIONGAP 10 11/16/2023   Last lipids Lab Results  Component Value Date   CHOL 171 09/10/2017   HDL 64 09/10/2017   LDLCALC 94 09/10/2017   LDLDIRECT 146.0 07/01/2012   TRIG 63 09/10/2017   CHOLHDL 4 12/22/2016   Last thyroid  functions Lab Results  Component Value Date   TSH 1.93 12/24/2022       The ASCVD Risk score (Arnett DK, et al., 2019) failed to calculate for the following reasons:   The 2019 ASCVD risk score is only valid for ages 71 to 41    Assessment & Plan:   Recent syncopal episode.  This occurred after experiencing severe knee pain in the setting of increased heat.  No suspicion of recent seizure activity.  Recent pacemaker interrogation revealed no concerns.  Lab work mostly reassuring.  No episodes of dizziness or syncope since hospital discharge.  -Discussed importance of increasing hydration starting early in the morning. - Avoid extreme heat during the day - Did discuss 1.9 cm right kidney lesion and he has already seen urologist and discussed with them and they did not see any evidence for growth or change from prior imaging. - Follow-up immediately for any recurrent dizziness or syncope  Wolm Scarlet, MD

## 2023-11-25 DIAGNOSIS — G4733 Obstructive sleep apnea (adult) (pediatric): Secondary | ICD-10-CM | POA: Diagnosis not present

## 2023-11-28 ENCOUNTER — Ambulatory Visit: Payer: Self-pay | Admitting: Sports Medicine

## 2023-12-01 NOTE — Discharge Instructions (Signed)

## 2023-12-02 ENCOUNTER — Ambulatory Visit
Admission: RE | Admit: 2023-12-02 | Discharge: 2023-12-02 | Disposition: A | Discharge status: Home / Self Care | Source: Ambulatory Visit | Attending: Family Medicine | Admitting: Family Medicine

## 2023-12-02 DIAGNOSIS — M4722 Other spondylosis with radiculopathy, cervical region: Secondary | ICD-10-CM | POA: Diagnosis not present

## 2023-12-02 DIAGNOSIS — M5010 Cervical disc disorder with radiculopathy, unspecified cervical region: Secondary | ICD-10-CM | POA: Diagnosis not present

## 2023-12-02 DIAGNOSIS — M542 Cervicalgia: Secondary | ICD-10-CM

## 2023-12-02 MED ORDER — TRIAMCINOLONE ACETONIDE 40 MG/ML IJ SUSP (RADIOLOGY)
60.0000 mg | Freq: Once | INTRAMUSCULAR | Status: AC
  Administered 2023-12-02: 60 mg via EPIDURAL

## 2023-12-02 MED ORDER — IOPAMIDOL (ISOVUE-M 300) INJECTION 61%
1.0000 mL | Freq: Once | INTRAMUSCULAR | Status: AC | PRN
Start: 2023-12-02 — End: 2023-12-02
  Administered 2023-12-02: 1 mL via EPIDURAL

## 2023-12-10 ENCOUNTER — Ambulatory Visit: Admitting: Family Medicine

## 2023-12-13 NOTE — Patient Instructions (Incomplete)
 Below is our plan:  We will continue levetiracetam  500mg  in am and 1000mg  in evenings. donepezil  10mg  daily. Please keep a close eye on your heart rate at home. If you have another syncopal event, we may need to evaluate further.   We could consider an MRI to look at your brain. We could look at labs that can help us  diagnose Alzheimer's Dementia which may open up a conversation regarding use of two new IV infusions that can help stabilize memory loss. We could also consider adding an oral medication called memantine. I will include info on this medication.   We will increase your pressure settings to get better control of your apnea. I will recheck download in 4-6 weeks.   Please make sure you are consistent with timing of seizure medication. I recommend annual visit with primary care provider (PCP) for complete physical and routine blood work. I recommend daily intake of vitamin D (400-800iu) and calcium  (800-1000mg ) for bone health. Discuss Dexa screening with PCP.   According to [place] law, you can not drive unless you are seizure / syncope free for at least 6 months and under physician's care.  Please maintain precautions. Do not participate in activities where a loss of awareness could harm you or someone else. No swimming alone, no tub bathing, no hot tubs, no driving, no operating motorized vehicles (cars, ATVs, motocycles, etc), lawnmowers, power tools or firearms. No standing at heights, such as rooftops, ladders or stairs. Avoid hot objects such as stoves, heaters, open fires. Wear a helmet when riding a bicycle, scooter, skateboard, etc. and avoid areas of traffic. Set your water  heater to 120 degrees or less.  SUDEP is the sudden, unexpected death of someone with epilepsy, who was otherwise healthy. In SUDEP cases, no other cause of death is found when an autopsy is done. Each year, more than 1 in 1,000 people with epilepsy die from SUDEP. This is the leading cause of death in people with  uncontrolled seizures. Until further answers are available, the best way to prevent SUDEP is to lower your risk by controlling seizures. Research has found that people with all types of epilepsy that experience convulsive seizures can be at risk.  Please continue using your CPAP regularly. While your insurance requires that you use CPAP at least 4 hours each night on 70% of the nights, I recommend, that you not skip any nights and use it throughout the night if you can. Getting used to CPAP and staying with the treatment long term does take time and patience and discipline. Untreated obstructive sleep apnea when it is moderate to severe can have an adverse impact on cardiovascular health and raise her risk for heart disease, arrhythmias, hypertension, congestive heart failure, stroke and diabetes. Untreated obstructive sleep apnea causes sleep disruption, nonrestorative sleep, and sleep deprivation. This can have an impact on your day to day functioning and cause daytime sleepiness and impairment of cognitive function, memory loss, mood disturbance, and problems focussing. Using CPAP regularly can improve these symptoms.  We will update supply orders, today.   Please make sure you are staying well hydrated. I recommend 50-60 ounces daily. Well balanced diet and regular exercise encouraged. Consistent sleep schedule with 6-8 hours recommended.   Please continue follow up with care team as directed.   Follow up with me in 6 months   You may receive a survey regarding today's visit. I encourage you to leave honest feed back as I do use this information to improve  patient care. Thank you for seeing me today!   Management of Memory Problems   There are some general things you can do to help manage your memory problems.  Your memory may not in fact recover, but by using techniques and strategies you will be able to manage your memory difficulties better.   1)  Establish a routine. Try to establish and  then stick to a regular routine.  By doing this, you will get used to what to expect and you will reduce the need to rely on your memory.  Also, try to do things at the same time of day, such as taking your medication or checking your calendar first thing in the morning. Think about think that you can do as a part of a regular routine and make a list.  Then enter them into a daily planner to remind you.  This will help you establish a routine.   2)  Organize your environment. Organize your environment so that it is uncluttered.  Decrease visual stimulation.  Place everyday items such as keys or cell phone in the same place every day (ie.  Basket next to front door) Use post it notes with a brief message to yourself (ie. Turn off light, lock the door) Use labels to indicate where things go (ie. Which cupboards are for food, dishes, etc.) Keep a notepad and pen by the telephone to take messages   3)  Memory Aids A diary or journal/notebook/daily planner Making a list (shopping list, chore list, to do list that needs to be done) Using an alarm as a reminder (kitchen timer or cell phone alarm) Using cell phone to store information (Notes, Calendar, Reminders) Calendar/White board placed in a prominent position Post-it notes   In order for memory aids to be useful, you need to have good habits.  It's no good remembering to make a note in your journal if you don't remember to look in it.  Try setting aside a certain time of day to look in journal.   4)  Improving mood and managing fatigue. There may be other factors that contribute to memory difficulties.  Factors, such as anxiety, depression and tiredness can affect memory. Regular gentle exercise can help improve your mood and give you more energy. Exercise: there are short videos created by the [employer] on Health specially for older adults: https://bit.ly/2I30q97.  Mediterranean diet: which emphasizes fruits, vegetables, whole grains,  legumes, fish, and other seafood; unsaturated fats such as olive oils; and low amounts of red meat, eggs, and sweets. A variation of this, called MIND (Mediterranean-DASH Intervention for Neurodegenerative Delay) incorporates the DASH (Dietary Approaches to Stop Hypertension) diet, which has been shown to lower high blood pressure, a risk factor for Alzheimer's disease. More information at: ExitMarketing.de.  Aerobic exercise that improve heart health is also good for the mind.  [employer] on Aging have short videos for exercises that you can do at home: BlindWorkshop.com.pt Simple relaxation techniques may help relieve symptoms of anxiety Try to get back to completing activities or hobbies you enjoyed doing in the past. Learn to pace yourself through activities to decrease fatigue. Find out about some local support groups where you can share experiences with others. Try and achieve 7-8 hours of sleep at night.   Tasks to improve attention/working memory 1. Good sleep hygiene (7-8 hrs of sleep) 2. Learning a new skill (Painting, Carpentry, Pottery, new language, Knitting). 3.Cognitive exercises (keep a daily journal, Puzzles) 4. Physical exercise and  training  (30 min/day X 4 days week) 5. Being on Antidepressant if needed 6.Yoga, Meditation, Tai Chi 7. Decrease alcohol intake 8.Have a clear schedule and structure in daily routine   MIND Diet: The Mediterranean-DASH Diet Intervention for Neurodegenerative Delay, or MIND diet, targets the health of the aging brain. Research participants with the highest MIND diet scores had a significantly slower rate of cognitive decline compared with those with the lowest scores. The effects of the MIND diet on cognition showed greater effects than either the Mediterranean or the DASH diet alone.   The healthy items the MIND diet guidelines suggest include:   3+ servings a  day of whole grains 1+ servings a day of vegetables (other than green leafy) 6+ servings a week of green leafy vegetables 5+ servings a week of nuts 4+ meals a week of beans 2+ servings a week of berries 2+ meals a week of poultry 1+ meals a week of fish Mainly olive oil if added fat is used   The unhealthy items, which are higher in saturated and trans fat, include: Less than 5 servings a week of pastries and sweets Less than 4 servings a week of red meat (including beef, pork, lamb, and products made from these meats) Less than one serving a week of cheese and fried foods Less than 1 tablespoon a day of butter/stick margarine

## 2023-12-13 NOTE — Progress Notes (Unsigned)
 PATIENT: [name] [name] DOB: 06/28/1940  REASON FOR VISIT: follow up HISTORY FROM: patient  No chief complaint on file.   Sethi: [name]: OSA   HISTORY OF PRESENT ILLNESS:  12/13/23 ALL:  [name] [name] returns for follow up for [name], OSA on CPAP and memory loss. He was last seen by me 03/2023 and reported worsening memory. MOCA 22/30, previously 24/30. Lab eval declined. I placed referral for neurocognitive evaluation. After discussion with cardiology, we started donepezil  5mg  daily for 4-6 weeks then increased dose to 10mg  daily. He was encouraged to continue lev 500/1000mg  and CPAP therapy.   Since,   03/30/2023 ALL:  [name] [name] returns for an acute visit to discuss concerns of worsening memory. He was last seen 09/2022 and reported doing well on CPAP, [name] were well managed on lev 500/1000mg  and memory was stable. MOCA 24/30.   Since, he feels that his memory has steadily worsened over the past year. He reports having more difficulty remembering where she placed items like his phone or keys. His wife reports concerns of him being able to retain information. For example, she tells me about a time when she had fed their dog when [name] [name] was with her and shortly after he could not remember that she had fed the dog. He has been reading the same book for over a year. He has difficulty with recalling recent events. [name] [name] reports about 20lb weight loss over the past 1-2 years. No changes with appetite. He continues to be active. He plays pickleball regularly. He enjoys playing brain games. He continues to drive. He denies accidents or concerns of getting lost. His wife reports that he drives much slower and more cautious than he has in the past. He drinks 1-2 beers at night.  He continues CPAP therapy nightly. He admits that he usually takes his machine off during the night and after going to the bathroom, he does not restart. Compliance review showed 100% daily compliance with 50%  four hour compliance. Average usage 4h , Residual AHI 3/hr.   10/14/2022 ALL:  [name] [name] is a 82 y.o. male here today for follow up for complex on CPAP, insomnia, [name]. He was last seen by Dr Rosemarie 03/2022. [name] were stable on lev 500/1000 and memory felt to be stable.   Since, he reports doing well. He continues lev 500mg  in and 1000mg  in pm. No seizure activity. Last seizure 04/2020. He denies missed doses.   Memory is stable. He continues to drive without difficulty. He helps maintain home. [name] [name] manages finances and always has. He is able to run to grocery store. Performs ADLs independently. He continues an active lifestyle with pickle ball and walking. He liks to do crossword puzzles and play sudoku. He is sleeping well. He continues trazodone  100mg  at bedtime. He is not sure he needs to continue. He seems to tolerate it well.   He continues regular follow up with PCP. BP is well managed. He continues asa and rosuvastatin . No recent TIA symptoms.     History (copied from Dr Bucky previous note)  He returns for follow-up after last visit 6 months ago.  He states he has had no further [name].  He remains on Keppra  500 mg in the morning and 1 gm at night and is tolerating it well without any side effects.  He continues to have mild short-term memory difficulties which she feels are unchanged and stable and not necessarily getting worse.  He has not been participating in any regular activities for stress relaxation.  He denies any new complaints.  He had follow-up appointment with Dr. Chalice on 09/23/2021.  He has no new complaints today.   HISTORY: (copied from Dr Dohmeier's previous note)  [name] [name] is a 42 y.o. right- handed  Caucasian male patient who seen here  in a sleep consultation   on 10/13/2021 from Dr Rosemarie, who he follows for nocturnal [name], he once had 4 in one night when he was vacationing in [place]- was hospitalized there. Beginning in 10/  2021 and had one last seizure January 2023. Dr Rosemarie had assumed that the initial event in [facility] in 2021 was a possible TIA related seizure. [name] [name] has a past medical history of Allergy, Arthritis, GERD (gastroesophageal reflux disease), HYPERLIPIDEMIA (07/17/2008), HYPERTENSION (07/17/2008), Nocturnal [name] (HCC), Sinus node dysfunction (HCC) (01/27/2011), and Sleep apnea. 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  He hasn't had any further [name] since march and no longer needs his [name] [name].    10-13-2021: I have the pleasure of seeing [name] [name] today he is a patient of Dr. Promotes that he is to whom he had presented with nocturnal [name] recently.  The patient had a diagnostic polysomnography on 06-23-2021 which revealed a very high apnea hypopnea index at 36.9/h and he had 10 minutes total with lower oxygen saturation.  The nadir was 84% this is not concerning by apnea standards he was a very frequent mover at night his periodic limb movement arousal index was 3.4 so he barely slept for half the recording time and even each time he went to sleep he had still significant apnea and periodic limb movements.  He underwent an in-house titration this was performed on 15 March with the help of registered PSG technician Donnice Counts.  Donnice increased his CPAP pressure to 7 cm setting which is a mild setting by all accounts.   There was a reduction of his apnea to 0.9/h so significant improvement but he still was frequently moving.  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  This has been helped by CPAP.   The patient has a clinical history of restless leg syndrome but he endorsed the Epworth Sleepiness Scale at 0 out of 24 points today his compliance report is excellent 100% of use 7 hours 41 minutes daily over 30 out of 30 days his AutoSet is  offering a pressure between 6 and 10 cm water  pressure with 1 cm EPR and his residual AHI was 2.0.  The 95th percentile pressure was 9.4 cm water .  There are some obstructive and some central apneas remaining but there was no Cheyne-Stokes respiration.  I would like to add that he did not have abnormal EEG findings during his attended sleep study.  Sleep relevant medical history: Nocturia 3-5 times- Family medical /sleep history:  no hx; cousin with OSA, insomnia, sleep walkers.  Social history: retired  from [employer], camping and [employer] -   Patient is working as and lives in a household with spouse, one dog. Adult children. Tobacco use quit 39 years ago- ETOH use 2-3 beers a day-  Smoking pot,  Caffeine intake in form of Coffee( 3 cups a day). Regular exercise in form of  walking, pickle ball. .     Sleep habits  are as follows: The patient's dinner time is between 5-7 PM. The patient goes to bed at 10-11 PM and continues to sleep for intervals of 2-3 hours , wakes for many,many  bathroom breaks,. He goes easily back to sleep, bedroom is cool, quiet and dark.  The preferred sleep position is sideways, with the support of 1 pillow, bed is adjustable but kept flat. . Dreams are reportedly rare.  7.30  AM is the usual rise time. The patient wakes up spontaneously. He reports  feeling refreshed / restored in AM, but stiffness.. Naps are taken infrequently.   REVIEW OF SYSTEMS: Out of a complete 14 system review of symptoms, the patient complains only of the following symptoms, memory loss, and all other reviewed systems are negative.   ALLERGIES: No Known Allergies  HOME MEDICATIONS: Outpatient Medications Prior to Visit  Medication Sig Dispense Refill   acetaminophen  (TYLENOL ) 325 MG tablet Take 2 tablets (650 mg total) by mouth every 6 (six) hours as needed for moderate pain (pain score 4-6).     Ascorbic Acid (VITAMIN C PO) Take 1,000 mg by mouth daily.     azelastine  (ASTELIN ) 0.1 %  nasal spray Place 1 spray into both nostrils 2 (two) times daily. Use in each nostril as directed 30 mL 11   benazepril  (LOTENSIN ) 40 MG tablet TAKE 1 TABLET BY MOUTH EVERY DAY 90 tablet 2   calcium  citrate (CALCITRATE - DOSED IN MG ELEMENTAL CALCIUM ) 950 (200 Ca) MG tablet Take 200 mg of elemental calcium  by mouth daily.     cholecalciferol (VITAMIN D3) 25 MCG (1000 UNIT) tablet Take 1,000 Units by mouth daily.     donepezil  (ARICEPT ) 10 MG tablet Take 1 tablet (10 mg total) by mouth at bedtime. 90 tablet 2   levETIRAcetam  (KEPPRA ) 500 MG tablet Take one tablet in am and two tablets in pm. (Patient taking differently: Take 500-1,000 mg by mouth See admin instructions. Take one tablet by mouth in the morning and then take two tablets by mouth in the evening) 270 tablet 3   NON FORMULARY Pt uses a cpap nightly     rosuvastatin  (CRESTOR ) 5 MG tablet TAKE 1 TABLET BY MOUTH EVERY DAY (Patient taking differently: Take 2.5 mg by mouth every evening.) 90 tablet 3   traZODone  (DESYREL ) 100 MG tablet Take 1 tablet (100 mg total) by mouth at bedtime as needed. for sleep 90 tablet 3   Turmeric (QC TUMERIC COMPLEX PO) Take 2 capsules by mouth daily.     No facility-administered medications prior to visit.    PAST MEDICAL HISTORY: Past Medical History:  Diagnosis Date   Bladder tumor    Cancer [facility])    Cardiac pacemaker in situ 01/27/2010   by dr fernande for documented SND,  St Jude dual chamber   GERD (gastroesophageal reflux disease)    History of adenomatous polyp of colon    History of basal cell carcinoma (BCC) excision    forehead and nose area 2015, 2016   History of squamous cell carcinoma excision    excision bilateral temple area  2015, 2016, 2019   History of syncope 2005   2005  w/ recurrent ;  documented sinus node dysfunction,   s/p PPM 2011  (03-20-2022  pt stated many yrs since last syncope)   History of transient ischemic attack (TIA)    Hyperlipidemia    Hypertension    Mild  cognitive impairment    Nocturia    OA (osteoarthritis)  OSA on CPAP    followed by dr chalice;   study in epic 06-23-2021 severe complex apnea syndrome,   pt stated usese cpap nightly   Presence of permanent cardiac pacemaker    RLS (restless legs syndrome)    Rotator cuff tear arthropathy, right    Seizure disorder [facility]) 2021   neurologist--- dr rosemarie;   started 2021 nocturnal [name];   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, not intractable , without status epilepticus  (03-20-2022  pt stated last seizure 01/ 2023)   Sinus node dysfunction [facility]) 2010   cardiologist--- dr fernande;  hx recurrent syncope;   04/ 2010  loop recorder implant w/ documented SND;   01-27-2010  s/p PPM dual chamber , St Jude   TIA (transient ischemic attack)    Wears glasses    Wears hearing aid in both ears     PAST SURGICAL HISTORY: Past Surgical History:  Procedure Laterality Date   CARDIAC PACEMAKER PLACEMENT  01/27/2010   @[facility]  by dr fernande;   explant loop recorder /  PPM  St Jude dual chamber   COLONOSCOPY WITH PROPOFOL   08/07/2020   dr pyrle   ESOPHAGOGASTRODUODENOSCOPY (EGD) WITH PROPOFOL   02/05/2014   dr pyrtle   LOOP RECORDER IMPLANT  08/17/2008   @[facility]   by dr fernande;   after tilt table study normal;  for recurrent syncope   PPM GENERATOR CHANGEOUT N/A 12/02/2022   Procedure: PPM GENERATOR CHANGEOUT;  Surgeon: fernande Elspeth BROCKS, MD;  Location: [facility] INVASIVE CV LAB;  Service: Cardiovascular;  Laterality: N/A;   TRANSURETHRAL RESECTION OF BLADDER TUMOR WITH MITOMYCIN -C N/A 03/23/2022   Procedure: TRANSURETHRAL RESECTION OF BLADDER TUMOR WITH POST OPERATIVE GEMCITABINE ;  Surgeon: Matilda Senior, MD;  Location: [facility];  Service: Urology;  Laterality: N/A;  45 MINS   TRANSURETHRAL RESECTION OF BLADDER TUMOR WITH MITOMYCIN -C N/A 05/14/2022   Procedure: TRANSURETHRAL RESECTION OF BLADDER TUMOR WITH GEMCITABINE ;  Surgeon: Matilda Senior, MD;  Location: [facility];  Service: Urology;  Laterality: N/A;  30 MINS    FAMILY HISTORY: Family History  Problem Relation Age of Onset   Hypertension Mother    Stroke Mother    Colitis Father    Hypertension Father    Other Father        esophageal stricture   Liver disease Father    Alcohol abuse Maternal Aunt    Rectal cancer Neg Hx    Stomach cancer Neg Hx    Esophageal cancer Neg Hx    Colon polyps Neg Hx    Colon cancer Neg Hx     SOCIAL HISTORY: Social History   Socioeconomic History   Marital status: Married    Spouse name: Peggy   Number of children: 1   Years of education: Not on file   Highest education level: Bachelor's degree (e.g., BA, AB, BS)  Occupational History   Occupation: CO-OWNER    Employer: [employer]   Tobacco Use   Smoking status: Former    Current packs/day: 0.00    Types: Cigarettes    Start date: 17    Quit date: 1982    Years since quitting: 43.6   Smokeless tobacco: Never  Vaping Use   Vaping status: Never Used  Substance and Sexual Activity   Alcohol use: Yes    Alcohol/week: 14.0 standard drinks of alcohol    Types: 14 Cans of beer per week    Comment: 03-20-2022 2 beers daily , 12  oz each   Drug use: Yes    Frequency: 7.0 times per week    Types: Marijuana    Comment: 03/22/2022   Sexual activity: Not on file  Other Topics Concern   Not on file  Social History Narrative   Lives with spouse   Right Handed   3-4 c caffeine    Social Drivers of Health   Financial Resource Strain: Low Risk  (09/10/2023)   Overall Financial Resource Strain (CARDIA)    Difficulty of Paying Living Expenses: Not hard at all  Food Insecurity: No Food Insecurity (11/15/2023)   Hunger Vital Sign    Worried About Running Out of Food in the Last Year: Never true    Ran Out of Food in the Last Year: Never true  Transportation Needs: No Transportation Needs (11/15/2023)   PRAPARE - Administrator, Civil Service (Medical):  No    Lack of Transportation (Non-Medical): No  Physical Activity: Sufficiently Active (09/10/2023)   Exercise Vital Sign    Days of Exercise per Week: 5 days    Minutes of Exercise per Session: 30 min  Stress: No Stress Concern Present (09/10/2023)   [employer] of Occupational Health - Occupational Stress Questionnaire    Feeling of Stress : Not at all  Social Connections: Socially Integrated (11/15/2023)   Social Connection and Isolation Panel    Frequency of Communication with Friends and Family: Three times a week    Frequency of Social Gatherings with Friends and Family: Three times a week    Attends Religious Services: More than 4 times per year    Active Member of Clubs or Organizations: Yes    Attends Banker Meetings: More than 4 times per year    Marital Status: Married  Recent Concern: Social Connections - Moderately Isolated (09/10/2023)   Social Connection and Isolation Panel    Frequency of Communication with Friends and Family: Three times a week    Frequency of Social Gatherings with Friends and Family: Twice a week    Attends Religious Services: Never    Database administrator or Organizations: No    Attends Engineer, structural: Not on file    Marital Status: Married  Catering manager Violence: Not At Risk (11/15/2023)   Humiliation, Afraid, Rape, and Kick questionnaire    Fear of Current or Ex-Partner: No    Emotionally Abused: No    Physically Abused: No    Sexually Abused: No     PHYSICAL EXAM  There were no vitals filed for this visit.   There is no height or weight on file to calculate BMI.  Generalized: Well developed, in no acute distress  Cardiology: normal rate and rhythm, no murmur noted Respiratory: clear to auscultation bilaterally  Neurological examination  Mentation: Alert oriented to time, place, history taking. Follows all commands speech and language fluent Cranial nerve II-XII: Pupils were equal round reactive  to light. Extraocular movements were full, visual field were full  Motor: The motor testing reveals 5 over 5 strength of all 4 extremities. Good symmetric motor tone is noted throughout.  Gait and station: Gait is normal.    DIAGNOSTIC DATA (LABS, IMAGING, TESTING) - I reviewed patient records, labs, notes, testing and imaging myself where available.     Lab Results  Component Value Date   WBC 6.8 11/16/2023   HGB 13.0 11/16/2023   HCT 37.8 (L) 11/16/2023   MCV 93.8 11/16/2023   PLT 140 (L)  11/16/2023      Component Value Date/Time   NA 137 11/16/2023 0306   NA 139 11/25/2022 1230   K 3.7 11/16/2023 0306   CL 101 11/16/2023 0306   CO2 26 11/16/2023 0306   GLUCOSE 92 11/16/2023 0306   BUN 17 11/16/2023 0306   BUN 20 11/25/2022 1230   CREATININE 0.90 11/16/2023 0306   CREATININE 0.84 04/16/2015 0742   CALCIUM  8.6 (L) 11/16/2023 0306   PROT 5.7 (L) 11/16/2023 0306   ALBUMIN 3.2 (L) 11/16/2023 0306   AST 21 11/16/2023 0306   ALT 18 11/16/2023 0306   ALKPHOS 39 11/16/2023 0306   BILITOT 0.8 11/16/2023 0306   GFRNONAA >60 11/16/2023 0306   GFRAA >60 12/11/2019 0454   Lab Results  Component Value Date   CHOL 171 09/10/2017   HDL 64 09/10/2017   LDLCALC 94 09/10/2017   LDLDIRECT 146.0 07/01/2012   TRIG 63 09/10/2017   CHOLHDL 4 12/22/2016   No results found for: HGBA1C No results found for: VITAMINB12 Lab Results  Component Value Date   TSH 1.93 12/24/2022       03/30/2023    4:27 PM 01/19/2018    1:33 PM  MMSE - Mini Mental State Exam  Not completed:  --  Orientation to time 5   Orientation to Place 5   Registration 3   Attention/ Calculation 3   Recall 0   Language- name 2 objects 2   Language- repeat 1   Language- follow 3 step command 3   Language- read & follow direction 1   Write a sentence 1   Copy design 0   Total score 24        03/30/2023    3:29 PM 10/14/2022    3:05 PM  Montreal Cognitive Assessment   Visuospatial/ Executive (0/5)  3 4  Naming (0/3) 3 3  Attention: Read list of digits (0/2) 2 2  Attention: Read list of letters (0/1) 1 1  Attention: Serial 7 subtraction starting at 100 (0/3) 3 3  Language: Repeat phrase (0/2) 2 2  Language : Fluency (0/1) 0 0  Abstraction (0/2) 2 2  Delayed Recall (0/5) 0 1  Orientation (0/6) 6 6  Total 22 24  Adjusted Score (based on education) 22      ASSESSMENT AND PLAN 83 y.o. year old male  has a past medical history of Bladder tumor, Cancer (HCC), Cardiac pacemaker in situ (01/27/2010), GERD (gastroesophageal reflux disease), History of adenomatous polyp of colon, History of basal cell carcinoma (BCC) excision, History of squamous cell carcinoma excision, History of syncope (2005), History of transient ischemic attack (TIA), Hyperlipidemia, Hypertension, Mild cognitive impairment, Nocturia, OA (osteoarthritis), OSA on CPAP, Presence of permanent cardiac pacemaker, RLS (restless legs syndrome), Rotator cuff tear arthropathy, right, Seizure disorder (HCC) (2021), Sinus node dysfunction (HCC) (2010), TIA (transient ischemic attack), Wears glasses, and Wears hearing aid in both ears. here with     ICD-10-CM   1. Seizure disorder (HCC)  G40.909     2. MCI (mild cognitive impairment)  G31.84     3. 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 He is doing well on CPAP therapy but admits to not using it consistently for 4 hours. Compliance report reveals excellent daily but sub optimal four hour compliance. He was encouraged to continue using CPAP nightly and for greater than  4 hours each night. Risks of untreated sleep apnea review and education materials provided. He will continue trazodone  100mg  at bedtime for insomnia for now. He will continue levetiracetam  500mg  in am and 1000mg  in te evenings. We discussed possible weaning seizure medicaitons but he is hesitant of driving  restrictions. I have advised extreme caution with driving. MOCA 22/30. MMSE 24/30. Labs offered but declined. He has cardiac history and will discuss use of donepezil  with Dr Beverli. Can consider memantine if he wishes. Formal neurocognitive referral placed. Memory compensation strategies reviewed. Healthy lifestyle habits encouraged. He will follow up in 6 months, sooner if needed. He verbalizes understanding and agreement with this plan.    No orders of the defined types were placed in this encounter.    No orders of the defined types were placed in this encounter.     Greig Forbes, FNP-C 12/13/2023, 8:22 AM [facility] [address] [place], [place] [address] [phone]

## 2023-12-14 ENCOUNTER — Other Ambulatory Visit: Payer: Self-pay

## 2023-12-14 ENCOUNTER — Encounter: Payer: Self-pay | Admitting: Family Medicine

## 2023-12-14 ENCOUNTER — Ambulatory Visit (INDEPENDENT_AMBULATORY_CARE_PROVIDER_SITE_OTHER): Admitting: Family Medicine

## 2023-12-14 ENCOUNTER — Telehealth: Payer: Self-pay | Admitting: *Deleted

## 2023-12-14 VITALS — BP 117/52 | HR 64 | Ht 67.0 in | Wt 150.5 lb

## 2023-12-14 DIAGNOSIS — Z7189 Other specified counseling: Secondary | ICD-10-CM | POA: Insufficient documentation

## 2023-12-14 DIAGNOSIS — G40909 Epilepsy, unspecified, not intractable, without status epilepticus: Secondary | ICD-10-CM | POA: Diagnosis not present

## 2023-12-14 DIAGNOSIS — G4739 Other sleep apnea: Secondary | ICD-10-CM

## 2023-12-14 DIAGNOSIS — H9193 Unspecified hearing loss, bilateral: Secondary | ICD-10-CM | POA: Insufficient documentation

## 2023-12-14 DIAGNOSIS — Z85828 Personal history of other malignant neoplasm of skin: Secondary | ICD-10-CM | POA: Insufficient documentation

## 2023-12-14 DIAGNOSIS — H532 Diplopia: Secondary | ICD-10-CM | POA: Insufficient documentation

## 2023-12-14 DIAGNOSIS — H4912 Fourth [trochlear] nerve palsy, left eye: Secondary | ICD-10-CM | POA: Insufficient documentation

## 2023-12-14 DIAGNOSIS — H903 Sensorineural hearing loss, bilateral: Secondary | ICD-10-CM | POA: Insufficient documentation

## 2023-12-14 DIAGNOSIS — H538 Other visual disturbances: Secondary | ICD-10-CM | POA: Insufficient documentation

## 2023-12-14 DIAGNOSIS — G3184 Mild cognitive impairment, so stated: Secondary | ICD-10-CM | POA: Diagnosis not present

## 2023-12-14 DIAGNOSIS — N2889 Other specified disorders of kidney and ureter: Secondary | ICD-10-CM | POA: Insufficient documentation

## 2023-12-14 DIAGNOSIS — R6889 Other general symptoms and signs: Secondary | ICD-10-CM | POA: Insufficient documentation

## 2023-12-14 DIAGNOSIS — G529 Cranial nerve disorder, unspecified: Secondary | ICD-10-CM | POA: Insufficient documentation

## 2023-12-14 DIAGNOSIS — L408 Other psoriasis: Secondary | ICD-10-CM | POA: Insufficient documentation

## 2023-12-14 DIAGNOSIS — H35033 Hypertensive retinopathy, bilateral: Secondary | ICD-10-CM | POA: Insufficient documentation

## 2023-12-14 DIAGNOSIS — J309 Allergic rhinitis, unspecified: Secondary | ICD-10-CM | POA: Insufficient documentation

## 2023-12-14 DIAGNOSIS — H6693 Otitis media, unspecified, bilateral: Secondary | ICD-10-CM | POA: Insufficient documentation

## 2023-12-14 DIAGNOSIS — D1803 Hemangioma of intra-abdominal structures: Secondary | ICD-10-CM | POA: Insufficient documentation

## 2023-12-14 DIAGNOSIS — Z461 Encounter for fitting and adjustment of hearing aid: Secondary | ICD-10-CM | POA: Insufficient documentation

## 2023-12-14 MED ORDER — TRAZODONE HCL 100 MG PO TABS: 100.0000 mg | ORAL_TABLET | Freq: Every evening | ORAL | 3 refills | Status: AC | PRN

## 2023-12-14 NOTE — Telephone Encounter (Signed)
 Received message from Amy: Can you guys check with pharmacy and see who is filling levetiracetam ? We have not filled it since 2023  [facility] pharmacy at [phone]. Spoke w/ Debby who transferred me to pharm tech/Salomon who transferred to medical records who would not release information without signed release.  I spoke w/ Amy who requested I call wife to see if she knew who was filling rx for husband. I called her at [phone]. LVM for her to call office.

## 2023-12-14 NOTE — Progress Notes (Unsigned)
 [name] [address] [place] [address] Phone: [phone] Subjective:   [name] [name], am serving as a scribe for Dr. Arthea Claudene.  I'm seeing this patient by the request  of:  Micheal Wolm ORN, MD  CC: right knee pain, neck pain   [name]  11/09/2023 Severe worsening pain in the neck especially on the left side.  Increase in limitation in range of motion.  Patient has 0 degrees of left sided rotation and left-sided sidebending.  Able to go even 2 degrees of extension past midline with the neck.  Audible popping heard.  Affecting daily activities as well as his sleep.  Failed formal physical therapy.  Will get MRI of the cervical spine and patient does have a pacemaker so we will have to do it at the hospital.  Depending on findings we will see if patient is a candidate for possible epidurals.  Follow-up with me again after imaging      Update 12/15/2023 [name] [name] is a 83 y.o. male coming in with complaint of cervical and R knee pain. Epidural 12/02/2023. Epidural injection was not helpful.   Steroid injection in R knee by Dr. Leonce on 11/18/2023. Patient states that his knee pain is not as bad as it was after the injection. Otherwise pain in neck is the same as last visit.       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   by dr fernande for documented SND,  St Jude dual chamber   GERD (gastroesophageal reflux disease)    History of adenomatous polyp of colon    History of basal cell carcinoma (BCC) excision    forehead and nose area 2015, 2016   History of squamous cell carcinoma excision    excision bilateral temple area  2015, 2016, 2019   History of syncope 2005   2005  w/ recurrent ;  documented sinus node dysfunction,   s/p PPM 2011  (03-20-2022  pt stated many yrs since last syncope)   History of transient ischemic attack (TIA)    Hyperlipidemia     Hypertension    Mild cognitive impairment    Nocturia    OA (osteoarthritis)    OSA on CPAP    followed by dr chalice;   study in epic 06-23-2021 severe complex apnea syndrome,   pt stated usese cpap nightly   Presence of permanent cardiac pacemaker    RLS (restless legs syndrome)    Rotator cuff tear arthropathy, right    Seizure disorder [facility]) 2021   neurologist--- dr rosemarie;   started 2021 nocturnal seizures;   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, not intractable , without status epilepticus  (03-20-2022  pt stated last seizure 01/ 2023)   Sinus node dysfunction [facility]) 2010   cardiologist--- dr fernande;  hx recurrent syncope;   04/ 2010  loop recorder implant w/ documented SND;   01-27-2010  s/p PPM dual chamber , St Jude   TIA (transient ischemic attack)    Wears glasses    Wears hearing aid in both ears    Past Surgical History:  Procedure Laterality Date   CARDIAC PACEMAKER PLACEMENT  01/27/2010   @[facility]  by dr fernande;   explant loop recorder /  PPM  St Jude dual chamber   COLONOSCOPY WITH PROPOFOL   08/07/2020   dr graylin  ESOPHAGOGASTRODUODENOSCOPY (EGD) WITH PROPOFOL   02/05/2014   dr pyrtle   LOOP RECORDER IMPLANT  08/17/2008   @[facility]   by dr fernande;   after tilt table study normal;  for recurrent syncope   PPM GENERATOR CHANGEOUT N/A 12/02/2022   Procedure: PPM GENERATOR CHANGEOUT;  Surgeon: fernande Elspeth BROCKS, MD;  Location: [facility] INVASIVE CV LAB;  Service: Cardiovascular;  Laterality: N/A;   TRANSURETHRAL RESECTION OF BLADDER TUMOR WITH MITOMYCIN -C N/A 03/23/2022   Procedure: TRANSURETHRAL RESECTION OF BLADDER TUMOR WITH POST OPERATIVE GEMCITABINE ;  Surgeon: Matilda Senior, MD;  Location: [facility];  Service: Urology;  Laterality: N/A;  45 MINS   TRANSURETHRAL RESECTION OF BLADDER TUMOR WITH MITOMYCIN -C N/A 05/14/2022   Procedure: TRANSURETHRAL RESECTION OF BLADDER TUMOR WITH GEMCITABINE ;  Surgeon: Matilda Senior, MD;  Location: [facility];  Service: Urology;  Laterality: N/A;  30 MINS   Social History   Socioeconomic History   Marital status: Married    Spouse name: Peggy   Number of children: 1   Years of education: Not on file   Highest education level: Bachelor's degree (e.g., BA, AB, BS)  Occupational History   Occupation: CO-OWNER    Employer: [employer]   Tobacco Use   Smoking status: Former    Current packs/day: 0.00    Types: Cigarettes    Start date: 80    Quit date: 1982    Years since quitting: 43.6   Smokeless tobacco: Never  Vaping Use   Vaping status: Never Used  Substance and Sexual Activity   Alcohol use: Yes    Alcohol/week: 14.0 standard drinks of alcohol    Types: 14 Cans of beer per week    Comment: 03-20-2022 2 beers daily , 12 oz each   Drug use: Yes    Frequency: 7.0 times per week    Types: Marijuana    Comment: 03/22/2022   Sexual activity: Not on file  Other Topics Concern   Not on file  Social History Narrative   Lives with spouse   Right Handed   3-4 c caffeine    Social Drivers of Health   Financial Resource Strain: Low Risk  (09/10/2023)   Overall Financial Resource Strain (CARDIA)    Difficulty of Paying Living Expenses: Not hard at all  Food Insecurity: No Food Insecurity (11/15/2023)   Hunger Vital Sign    Worried About Running Out of Food in the Last Year: Never true    Ran Out of Food in the Last Year: Never true  Transportation Needs: No Transportation Needs (11/15/2023)   PRAPARE - Administrator, Civil Service (Medical): No    Lack of Transportation (Non-Medical): No  Physical Activity: Sufficiently Active (09/10/2023)   Exercise Vital Sign    Days of Exercise per Week: 5 days    Minutes of Exercise per Session: 30 min  Stress: No Stress Concern Present (09/10/2023)   [employer] of Occupational Health - Occupational Stress Questionnaire    Feeling of Stress : Not at all  Social Connections: Socially  Integrated (11/15/2023)   Social Connection and Isolation Panel    Frequency of Communication with Friends and Family: Three times a week    Frequency of Social Gatherings with Friends and Family: Three times a week    Attends Religious Services: More than 4 times per year    Active Member of Clubs or Organizations: Yes    Attends Banker Meetings: More than 4 times per year  Marital Status: Married  Recent Concern: Social Connections - Moderately Isolated (09/10/2023)   Social Connection and Isolation Panel    Frequency of Communication with Friends and Family: Three times a week    Frequency of Social Gatherings with Friends and Family: Twice a week    Attends Religious Services: Never    Database administrator or Organizations: No    Attends Engineer, structural: Not on file    Marital Status: Married   No Known Allergies Family History  Problem Relation Age of Onset   Hypertension Mother    Stroke Mother    Colitis Father    Hypertension Father    Other Father        esophageal stricture   Liver disease Father    Alcohol abuse Maternal Aunt    Rectal cancer Neg Hx    Stomach cancer Neg Hx    Esophageal cancer Neg Hx    Colon polyps Neg Hx    Colon cancer Neg Hx      Current Outpatient Medications (Cardiovascular):    benazepril  (LOTENSIN ) 40 MG tablet, TAKE 1 TABLET BY MOUTH EVERY DAY   rosuvastatin  (CRESTOR ) 5 MG tablet, TAKE 1 TABLET BY MOUTH EVERY DAY (Patient taking differently: Take 2.5 mg by mouth daily.)  Current Outpatient Medications (Respiratory):    azelastine  (ASTELIN ) 0.1 % nasal spray, Place 1 spray into both nostrils 2 (two) times daily. Use in each nostril as directed  Current Outpatient Medications (Analgesics):    acetaminophen  (TYLENOL ) 325 MG tablet, Take 2 tablets (650 mg total) by mouth every 6 (six) hours as needed for moderate pain (pain score 4-6).   Current Outpatient Medications (Other):    Ascorbic Acid (VITAMIN C  PO), Take 1,000 mg by mouth daily.   calcium  citrate (CALCITRATE - DOSED IN MG ELEMENTAL CALCIUM ) 950 (200 Ca) MG tablet, Take 200 mg of elemental calcium  by mouth daily.   cholecalciferol (VITAMIN D3) 25 MCG (1000 UNIT) tablet, Take 1,000 Units by mouth daily.   donepezil  (ARICEPT ) 10 MG tablet, Take 1 tablet (10 mg total) by mouth at bedtime.   DULoxetine  (CYMBALTA ) 20 MG capsule, Take 1 capsule (20 mg total) by mouth daily.   levETIRAcetam  (KEPPRA ) 500 MG tablet, Take one tablet in am and two tablets in pm.   NON FORMULARY, Pt uses a cpap nightly   traZODone  (DESYREL ) 100 MG tablet, Take 1 tablet (100 mg total) by mouth at bedtime as needed. for sleep   Turmeric (QC TUMERIC COMPLEX PO), Take 2 capsules by mouth daily.   Reviewed prior external information including notes and imaging from  primary care provider As well as notes that were available from care everywhere and other healthcare systems.  Past medical history, social, surgical and family history all reviewed in electronic medical record.  No pertanent information unless stated regarding to the chief complaint.   Review of Systems:  No headache, visual changes, nausea, vomiting, diarrhea, constipation, dizziness, abdominal pain, skin rash, fevers, chills, night sweats, weight loss, swollen lymph nodes, body aches, joint swelling, chest pain, shortness of breath, mood changes. POSITIVE muscle aches  Objective  Blood pressure (!) 94/54, pulse 68, height 5' 7 (1.702 m), weight 149 lb (67.6 kg), SpO2 98%.   General: No apparent distress alert and oriented x3 mood and affect normal, dressed appropriately.  HEENT: Pupils equal, extraocular movements intact  Respiratory: Patient's speak in full sentences and does not appear short of breath  Cardiovascular: No lower extremity edema,  non tender, no erythema  Right knee exam shows patient does have some instability noted.  Trace effusion noted.  Was wearing a compression sleeve.  Neck  exam shows patient does have significant loss of lordosis.  Significant tightness noted with less than 5 degrees of extension of the neck and no significant sidebending on the left side.    Impression and Recommendations:     The above documentation has been reviewed and is accurate and complete Valecia Beske M Colbin Jovel, DO

## 2023-12-15 ENCOUNTER — Other Ambulatory Visit: Payer: Self-pay

## 2023-12-15 ENCOUNTER — Ambulatory Visit: Admitting: Family Medicine

## 2023-12-15 ENCOUNTER — Telehealth: Payer: Self-pay

## 2023-12-15 ENCOUNTER — Telehealth: Payer: Self-pay | Admitting: Family Medicine

## 2023-12-15 VITALS — BP 94/54 | HR 68 | Ht 67.0 in | Wt 149.0 lb

## 2023-12-15 DIAGNOSIS — M5030 Other cervical disc degeneration, unspecified cervical region: Secondary | ICD-10-CM | POA: Diagnosis not present

## 2023-12-15 DIAGNOSIS — M1711 Unilateral primary osteoarthritis, right knee: Secondary | ICD-10-CM | POA: Diagnosis not present

## 2023-12-15 MED ORDER — DULOXETINE HCL 20 MG PO CPEP: 20.0000 mg | ORAL_CAPSULE | Freq: Every day | ORAL | 0 refills | Status: DC

## 2023-12-15 NOTE — Assessment & Plan Note (Signed)
 Awaiting approval for viscosupplementation and we will have patient come back at a later date.

## 2023-12-15 NOTE — Assessment & Plan Note (Addendum)
 Severe arthritic changes that did not respond to an epidural.  Concern secondary to the osteophyte formation and facet narrowing.  Significant limitation in range of motion noted.  Discussed with patient as well as his significant other about different treatment options.  Elected to try Cymbalta  20 mg at night.  Warned of potential side effects.  Discussed with patient about icing regimen and home exercises, discussed which activities to do and which ones to avoid.  Follow-up again in 6 to 8 weeks.  Worsening pain need to consider referral to neurosurgery

## 2023-12-15 NOTE — Patient Instructions (Addendum)
 Cymbalta  20mg  Send message in 2-3 weeks We'll get gel approval See you again in 2 months to discuss Cymbalta 

## 2023-12-15 NOTE — Telephone Encounter (Signed)
 Patient ran for Monovisc for bilateral knees. Case [id number]. Pending approval.

## 2023-12-15 NOTE — Telephone Encounter (Signed)
 Patients wife called and said that patients prescription was supposed to be sent to [facility] [address] and not the pharmacy in [place]. Please advise.

## 2023-12-16 NOTE — Telephone Encounter (Signed)
 Called wife at  [phone]. LVM.

## 2023-12-16 NOTE — Telephone Encounter (Signed)
 Pt has returned call to [place], [place]

## 2023-12-16 NOTE — Telephone Encounter (Signed)
 Took call from [name] [name]/phone room. Spoke w/ pt/wife. They thought our office was filling levertiracetam . He recently saw new PCP via [place]. Got refill of levetiracetam  yesterday. They will call to find out who is prescribing and call back today to let us  know.

## 2023-12-16 NOTE — Telephone Encounter (Signed)
 Patient needs an appointment when medication is in stock.   Monovisc is approved for bilateral knees.   Deductible does not apply. Once the OOP has been met, patient is covered at 100%. Prior authorization is not required. REF #: [id number]   Case ID: [id number] Exp: 06/16/2024

## 2023-12-16 NOTE — Telephone Encounter (Signed)
 Pt returned call. He stated that as far as he knows Dr. Cesario will continue to fill his levETIRAcetam  (KEPPRA ) 500 MG tablet . Pt requested nurse to call if any other questions.

## 2023-12-16 NOTE — Telephone Encounter (Signed)
 Amy- FYI. Dr. Cesario (PCP at [facility])

## 2023-12-21 NOTE — Telephone Encounter (Signed)
Scheduled 10/29

## 2023-12-22 NOTE — Progress Notes (Unsigned)
 [name] [address] [place] [address] Phone: [phone] Subjective:   [name] [name], am serving as a scribe for Dr. Arthea Claudene.  I'm seeing this patient by the request  of:  Micheal Wolm ORN, MD  CC: Right knee pain  [name]  12/15/2023 Awaiting approval for viscosupplementation and we will have patient come back at a later date.     Severe arthritic changes that did not respond to an epidural.  Concern secondary to the osteophyte formation and facet narrowing.  Significant limitation in range of motion noted.  Discussed with patient as well as his significant other about different treatment options.  Elected to try Cymbalta  20 mg at night.  Warned of potential side effects.  Discussed with patient about icing regimen and home exercises, discussed which activities to do and which ones to avoid.  Follow-up again in 6 to 8 weeks.  Worsening pain need to consider referral to neurosurgery     Updated 12/23/2023 [name] [name] is a 83 y.o. male coming in with complaint of R knee pain. Here for gel injection patient is having pain on a regular basis.  Steroids have been given and no longer working on regular basis or only gives him a very finite amount of time.       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   by dr fernande for documented SND,  St Jude dual chamber   GERD (gastroesophageal reflux disease)    History of adenomatous polyp of colon    History of basal cell carcinoma (BCC) excision    forehead and nose area 2015, 2016   History of squamous cell carcinoma excision    excision bilateral temple area  2015, 2016, 2019   History of syncope 2005   2005  w/ recurrent ;  documented sinus node dysfunction,   s/p PPM 2011  (03-20-2022  pt stated many yrs since last syncope)   History of transient ischemic attack (TIA)    Hyperlipidemia    Hypertension     Mild cognitive impairment    Nocturia    OA (osteoarthritis)    OSA on CPAP    followed by dr chalice;   study in epic 06-23-2021 severe complex apnea syndrome,   pt stated usese cpap nightly   Presence of permanent cardiac pacemaker    RLS (restless legs syndrome)    Rotator cuff tear arthropathy, right    Seizure disorder [facility]) 2021   neurologist--- dr rosemarie;   started 2021 nocturnal seizures;   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, not intractable , without status epilepticus  (03-20-2022  pt stated last seizure 01/ 2023)   Sinus node dysfunction [facility]) 2010   cardiologist--- dr fernande;  hx recurrent syncope;   04/ 2010  loop recorder implant w/ documented SND;   01-27-2010  s/p PPM dual chamber , St Jude   TIA (transient ischemic attack)    Wears glasses    Wears hearing aid in both ears    Past Surgical History:  Procedure Laterality Date   CARDIAC PACEMAKER PLACEMENT  01/27/2010   @[facility]  by dr fernande;   explant loop recorder /  PPM  St Jude dual chamber   COLONOSCOPY WITH PROPOFOL   08/07/2020   dr pyrle   ESOPHAGOGASTRODUODENOSCOPY (EGD) WITH PROPOFOL   02/05/2014   dr albertus  LOOP RECORDER IMPLANT  08/17/2008   @[facility]   by dr fernande;   after tilt table study normal;  for recurrent syncope   PPM GENERATOR CHANGEOUT N/A 12/02/2022   Procedure: PPM GENERATOR CHANGEOUT;  Surgeon: fernande Elspeth BROCKS, MD;  Location: [facility] INVASIVE CV LAB;  Service: Cardiovascular;  Laterality: N/A;   TRANSURETHRAL RESECTION OF BLADDER TUMOR WITH MITOMYCIN -C N/A 03/23/2022   Procedure: TRANSURETHRAL RESECTION OF BLADDER TUMOR WITH POST OPERATIVE GEMCITABINE ;  Surgeon: Matilda Senior, MD;  Location: [facility];  Service: Urology;  Laterality: N/A;  45 MINS   TRANSURETHRAL RESECTION OF BLADDER TUMOR WITH MITOMYCIN -C N/A 05/14/2022   Procedure: TRANSURETHRAL RESECTION OF BLADDER TUMOR WITH GEMCITABINE ;  Surgeon: Matilda Senior, MD;  Location:  [facility];  Service: Urology;  Laterality: N/A;  30 MINS   Social History   Socioeconomic History   Marital status: Married    Spouse name: Peggy   Number of children: 1   Years of education: Not on file   Highest education level: Bachelor's degree (e.g., BA, AB, BS)  Occupational History   Occupation: CO-OWNER    Employer: [employer]   Tobacco Use   Smoking status: Former    Current packs/day: 0.00    Types: Cigarettes    Start date: 68    Quit date: 1982    Years since quitting: 43.7   Smokeless tobacco: Never  Vaping Use   Vaping status: Never Used  Substance and Sexual Activity   Alcohol use: Yes    Alcohol/week: 14.0 standard drinks of alcohol    Types: 14 Cans of beer per week    Comment: 03-20-2022 2 beers daily , 12 oz each   Drug use: Yes    Frequency: 7.0 times per week    Types: Marijuana    Comment: 03/22/2022   Sexual activity: Not on file  Other Topics Concern   Not on file  Social History Narrative   Lives with spouse   Right Handed   3-4 c caffeine    Social Drivers of Health   Financial Resource Strain: Low Risk  (09/10/2023)   Overall Financial Resource Strain (CARDIA)    Difficulty of Paying Living Expenses: Not hard at all  Food Insecurity: No Food Insecurity (11/15/2023)   Hunger Vital Sign    Worried About Running Out of Food in the Last Year: Never true    Ran Out of Food in the Last Year: Never true  Transportation Needs: No Transportation Needs (11/15/2023)   PRAPARE - Administrator, Civil Service (Medical): No    Lack of Transportation (Non-Medical): No  Physical Activity: Sufficiently Active (09/10/2023)   Exercise Vital Sign    Days of Exercise per Week: 5 days    Minutes of Exercise per Session: 30 min  Stress: No Stress Concern Present (09/10/2023)   [employer] of Occupational Health - Occupational Stress Questionnaire    Feeling of Stress : Not at all  Social Connections: Socially Integrated (11/15/2023)    Social Connection and Isolation Panel    Frequency of Communication with Friends and Family: Three times a week    Frequency of Social Gatherings with Friends and Family: Three times a week    Attends Religious Services: More than 4 times per year    Active Member of Clubs or Organizations: Yes    Attends Banker Meetings: More than 4 times per year    Marital Status: Married  Recent Concern: Social Connections - Moderately Isolated (09/10/2023)  Social Connection and Isolation Panel    Frequency of Communication with Friends and Family: Three times a week    Frequency of Social Gatherings with Friends and Family: Twice a week    Attends Religious Services: Never    Database administrator or Organizations: No    Attends Engineer, structural: Not on file    Marital Status: Married   No Known Allergies Family History  Problem Relation Age of Onset   Hypertension Mother    Stroke Mother    Colitis Father    Hypertension Father    Other Father        esophageal stricture   Liver disease Father    Alcohol abuse Maternal Aunt    Rectal cancer Neg Hx    Stomach cancer Neg Hx    Esophageal cancer Neg Hx    Colon polyps Neg Hx    Colon cancer Neg Hx      Current Outpatient Medications (Cardiovascular):    benazepril  (LOTENSIN ) 40 MG tablet, TAKE 1 TABLET BY MOUTH EVERY DAY   rosuvastatin  (CRESTOR ) 5 MG tablet, TAKE 1 TABLET BY MOUTH EVERY DAY (Patient taking differently: Take 2.5 mg by mouth daily.)  Current Outpatient Medications (Respiratory):    azelastine  (ASTELIN ) 0.1 % nasal spray, Place 1 spray into both nostrils 2 (two) times daily. Use in each nostril as directed  Current Outpatient Medications (Analgesics):    acetaminophen  (TYLENOL ) 325 MG tablet, Take 2 tablets (650 mg total) by mouth every 6 (six) hours as needed for moderate pain (pain score 4-6).   Current Outpatient Medications (Other):    Ascorbic Acid (VITAMIN C PO), Take 1,000 mg by mouth  daily.   calcium  citrate (CALCITRATE - DOSED IN MG ELEMENTAL CALCIUM ) 950 (200 Ca) MG tablet, Take 200 mg of elemental calcium  by mouth daily.   cholecalciferol (VITAMIN D3) 25 MCG (1000 UNIT) tablet, Take 1,000 Units by mouth daily.   donepezil  (ARICEPT ) 10 MG tablet, Take 1 tablet (10 mg total) by mouth at bedtime.   DULoxetine  (CYMBALTA ) 20 MG capsule, Take 1 capsule (20 mg total) by mouth daily.   levETIRAcetam  (KEPPRA ) 500 MG tablet, Take one tablet in am and two tablets in pm.   NON FORMULARY, Pt uses a cpap nightly   traZODone  (DESYREL ) 100 MG tablet, Take 1 tablet (100 mg total) by mouth at bedtime as needed. for sleep   Turmeric (QC TUMERIC COMPLEX PO), Take 2 capsules by mouth daily.   Reviewed prior external information including notes and imaging from  primary care provider As well as notes that were available from care everywhere and other healthcare systems.  Past medical history, social, surgical and family history all reviewed in electronic medical record.  No pertanent information unless stated regarding to the chief complaint.   Review of Systems:  No headache, visual changes, nausea, vomiting, diarrhea, constipation, dizziness, abdominal pain, skin rash, fevers, chills, night sweats, weight loss, swollen lymph nodes, body aches, joint swelling, chest pain, shortness of breath, mood changes. POSITIVE muscle aches  Objective  Blood pressure 112/72, pulse 67, height 5' 7 (1.702 m), SpO2 97%.   General: No apparent distress alert and oriented x3 mood and affect normal, dressed appropriately.  HEENT: Pupils equal, extraocular movements intact  Respiratory: Patient's speak in full sentences and does not appear short of breath  Cardiovascular: No lower extremity edema, non tender, no erythema   Right knee does have arthritic changes noted.  Crepitus noted.  Instability with valgus  and varus force.  After informed written and verbal consent, patient was seated on exam table.  Right knee was prepped with alcohol swab and utilizing anterolateral approach, patient's right knee space was injected with 48 mg per 3 mL of Monovisc (sodium hyaluronate) in a prefilled syringe was injected easily into the knee through a 22-gauge needle..Patient tolerated the procedure well without immediate complications.The above documentation has been reviewed and is accurate and complete Arthea CHRISTELLA Sharps, DO    Impression and Recommendations:     The above documentation has been reviewed and is accurate and complete Laiken Nohr M Prabhjot Maddux, DO

## 2023-12-23 ENCOUNTER — Ambulatory Visit (INDEPENDENT_AMBULATORY_CARE_PROVIDER_SITE_OTHER): Admitting: Family Medicine

## 2023-12-23 ENCOUNTER — Encounter: Payer: Self-pay | Admitting: Family Medicine

## 2023-12-23 VITALS — BP 112/72 | HR 67 | Ht 67.0 in

## 2023-12-23 DIAGNOSIS — M1711 Unilateral primary osteoarthritis, right knee: Secondary | ICD-10-CM | POA: Diagnosis not present

## 2023-12-23 MED ORDER — HYALURONAN 88 MG/4ML IX SOSY
88.0000 mg | PREFILLED_SYRINGE | Freq: Once | INTRA_ARTICULAR | Status: AC
Start: 2023-12-23 — End: 2023-12-23
  Administered 2023-12-23: 88 mg via INTRA_ARTICULAR

## 2023-12-23 NOTE — Assessment & Plan Note (Signed)
 I discussed supplementation given today.  Discussed exercises, which activities to do which ones to avoid.  Discussed slow improvement noted.  Follow-up again in 2 to 3 months

## 2023-12-23 NOTE — Patient Instructions (Addendum)
 Monovisc injection to R knee today  If any signs of infection occur such as redness, swelling, heat, or pain, please seek medical attention or go into emergency room for further evaluation See me again in 2 months

## 2023-12-28 ENCOUNTER — Encounter: Payer: Self-pay | Admitting: Pulmonary Disease

## 2023-12-28 ENCOUNTER — Ambulatory Visit: Attending: Pulmonary Disease | Admitting: Pulmonary Disease

## 2023-12-28 VITALS — BP 122/52 | HR 75 | Ht 67.0 in | Wt 147.0 lb

## 2023-12-28 DIAGNOSIS — Z950 Presence of cardiac pacemaker: Secondary | ICD-10-CM

## 2023-12-28 DIAGNOSIS — I495 Sick sinus syndrome: Secondary | ICD-10-CM

## 2023-12-28 LAB — CUP PACEART INCLINIC DEVICE CHECK
Date Time Interrogation Session: 20250909162424
Implantable Lead Connection Status: 753985
Implantable Lead Connection Status: 753985
Implantable Lead Implant Date: 20111010
Implantable Lead Implant Date: 20111010
Implantable Lead Location: 753859
Implantable Lead Location: 753860
Implantable Pulse Generator Implant Date: 20240814
Pulse Gen Model: 2272
Pulse Gen Serial Number: 8201207

## 2023-12-28 NOTE — Progress Notes (Signed)
  Electrophysiology Office Note:   Date:  12/28/2023  ID:  [name] [name], DOB [birth year], MRN [id number]  Primary Cardiologist: None Primary Heart Failure: None Electrophysiologist: OLE ONEIDA HOLTS, MD      History of Present Illness:   [name] [name] is a 83 y.o. male with h/o SND s/p PPM, HTN, HLD, TIA, OSA on CPAP, RLS, GERD, seizure disorder seen today for routine electrophysiology followup.   Since last being seen in our clinic the patient reports doing well overall.  He fell early in the summer while out in the heat and was concerned he had damaged his device. No injuries from the fall. No other device related concerns.  He denies chest pain, palpitations, dyspnea, PND, orthopnea, nausea, vomiting, dizziness, syncope, edema, weight gain, or early satiety.   Review of systems complete and found to be negative unless listed in HPI.    EP Information / Studies Reviewed:    EKG is not ordered today. EKG from 11/17/23 reviewed which showed SR with prolonged PR interval 50 bpm      PPM Interrogation-  reviewed in detail today,  See PACEART report.  Device History: Abbott Dual Chamber PPM implanted 01/27/2010 for Sinus Node Dysfunction Generator Change > 12/02/2022 Known lead noise with AMS / noise reversion episodes   Risk Assessment/Calculations:              Physical Exam:   VS:  BP (!) 122/52 (BP Location: Right Arm, Patient Position: Sitting, Cuff Size: Normal)   Pulse 75   Ht 5' 7 (1.702 m)   Wt 147 lb (66.7 kg)   SpO2 95%   BMI 23.02 kg/m    Wt Readings from Last 3 Encounters:  12/28/23 147 lb (66.7 kg)  12/15/23 149 lb (67.6 kg)  12/14/23 150 lb 8 oz (68.3 kg)     GEN: Well nourished, well developed in no acute distress NECK: No JVD; No carotid bruits CARDIAC: Regular rate and rhythm, no murmurs, rubs, gallops. Device site wnl, no tethering RESPIRATORY:  Clear to auscultation without rales, wheezing or rhonchi  ABDOMEN: Soft, non-tender,  non-distended EXTREMITIES:  No edema; No deformity   ASSESSMENT AND PLAN:    SND s/p Abbott PPM  Hx Recurrent Syncope (prior to device) -Normal PPM function -See Pace Art report -No changes today -4 episodes of known lead noise with AMS & noise reversion episodes / very brief  -no evidence of injury to device after fall early in summer on check / wnl  Hypertension  LVEF 60-65% 10/2023  -well controlled on current regimen     Disposition:   Follow up with Dr. HOLTS in 12 months  Signed, Daphne Barrack, NP-C, AGACNP-BC [facility] - Electrophysiology  12/28/2023, 3:15 PM

## 2023-12-28 NOTE — Patient Instructions (Signed)
 Medication Instructions:  Your physician recommends that you continue on your current medications as directed. Please refer to the Current Medication list given to you today.  *If you need a refill on your cardiac medications before your next appointment, please call your pharmacy*  Lab Work: None ordered If you have labs (blood work) drawn today and your tests are completely normal, you will receive your results only by: MyChart Message (if you have MyChart) OR A paper copy in the mail If you have any lab test that is abnormal or we need to change your treatment, we will call you to review the results.  Follow-Up: At [facility], you and your health needs are our priority.  As part of our continuing mission to provide you with exceptional heart care, our providers are all part of one team.  This team includes your primary Cardiologist (physician) and Advanced Practice Providers or APPs (Physician Assistants and Nurse Practitioners) who all work together to provide you with the care you need, when you need it.  Your next appointment:   1 year(s)  Provider:   Harvie Liner, MD

## 2023-12-29 ENCOUNTER — Telehealth: Payer: Self-pay | Admitting: *Deleted

## 2023-12-29 MED ORDER — COVID-19 MRNA VACC (MODERNA) 50 MCG/0.5ML IM SUSP: 0.5000 mL | Freq: Once | INTRAMUSCULAR | 0 refills | Status: AC

## 2023-12-29 NOTE — Telephone Encounter (Signed)
 Rx done.

## 2023-12-29 NOTE — Telephone Encounter (Signed)
 Copied from CRM #[id number]. Topic: Clinical - Medication Question >> Dec 29, 2023  4:15 PM [name] wrote: Reason for CRM: Patient called in regarding the covid prescription to be sent to  [id number] [address] [place], [place] - [address] [place] [address] Phone: [phone] Fax: [phone]

## 2023-12-31 ENCOUNTER — Ambulatory Visit: Payer: Self-pay | Admitting: Cardiology

## 2023-12-31 ENCOUNTER — Other Ambulatory Visit: Payer: Self-pay | Admitting: Family Medicine

## 2024-01-11 ENCOUNTER — Other Ambulatory Visit: Payer: Self-pay

## 2024-01-11 ENCOUNTER — Telehealth: Payer: Self-pay | Admitting: Family Medicine

## 2024-01-11 DIAGNOSIS — M5030 Other cervical disc degeneration, unspecified cervical region: Secondary | ICD-10-CM

## 2024-01-11 NOTE — Telephone Encounter (Signed)
 Patient called stating that the medication he was given has not improved his neck pain. He said that Dr Claudene mentioned there might be a bone spur on his vertebrae? He asked if someone could call him to discuss other options?  Please advise.

## 2024-01-11 NOTE — Telephone Encounter (Signed)
 Called pt and let him know that I manually went in and changed his settings back to Set Min Pressure to 10 cmH2O Set Max Pressure to 12 cmH2O. Told pt once he uses his machine tonight his settings will be where they need to be.

## 2024-01-11 NOTE — Telephone Encounter (Signed)
 Pt called to request  a callback , PT states  that  [employer] fixed the pressure on CPAP machine . However pressure has went back low . PT wanted to inform MD  Should Pt call [employer] , Informed Pt to call [employer]  and I will message MD to let Msd know about CPAP machine. Pt has [employer] number

## 2024-01-17 DIAGNOSIS — M4722 Other spondylosis with radiculopathy, cervical region: Secondary | ICD-10-CM | POA: Diagnosis not present

## 2024-01-18 ENCOUNTER — Other Ambulatory Visit (HOSPITAL_COMMUNITY): Payer: Self-pay | Admitting: Surgery

## 2024-01-18 DIAGNOSIS — M4722 Other spondylosis with radiculopathy, cervical region: Secondary | ICD-10-CM

## 2024-01-26 ENCOUNTER — Ambulatory Visit: Admitting: Family Medicine

## 2024-02-08 ENCOUNTER — Other Ambulatory Visit: Payer: Self-pay | Admitting: Family Medicine

## 2024-02-15 ENCOUNTER — Ambulatory Visit (INDEPENDENT_AMBULATORY_CARE_PROVIDER_SITE_OTHER): Admitting: Family Medicine

## 2024-02-15 VITALS — BP 120/60 | HR 83 | Temp 97.6°F | Wt 145.4 lb

## 2024-02-15 DIAGNOSIS — R051 Acute cough: Secondary | ICD-10-CM

## 2024-02-15 MED ORDER — CEFDINIR 300 MG PO CAPS: 300.0000 mg | ORAL_CAPSULE | Freq: Two times a day (BID) | ORAL | 0 refills | Status: AC

## 2024-02-15 NOTE — Patient Instructions (Signed)
 Check to see if you have had RSV vaccine.    Follow up for any fever or increased shortness of breath.

## 2024-02-15 NOTE — Progress Notes (Signed)
 Established Patient Office Visit  Subjective   Patient ID: [name] [name], male    DOB: 07-24-1940  Age: 83 y.o. MRN: [id number]  Chief Complaint  Patient presents with   Cough   Nasal Congestion    HPI   [name] is seen today with productive cough which seem to be worsening.  He and his wife went to [place]  for a very short turnaround trip couple weeks ago and he feels like he may have picked up something there.  He had significant nasal congestion and cough and has basically felt no better in a couple of weeks.  Has tried over-the-counter Mucinex without improvement.  No fever.  Denies any nausea, vomiting, dyspnea.  He has had profound fatigue.  Sleeping a little more than usual.  His chronic problems include hypertension, history of TIA, obstructive sleep apnea, GERD, history of bladder cancer  Past Medical History:  Diagnosis Date   Bladder tumor    Cancer [facility])    Cardiac pacemaker in situ 01/27/2010   by dr fernande for documented SND,  St Jude dual chamber   GERD (gastroesophageal reflux disease)    History of adenomatous polyp of colon    History of basal cell carcinoma (BCC) excision    forehead and nose area 2015, 2016   History of squamous cell carcinoma excision    excision bilateral temple area  2015, 2016, 2019   History of syncope 2005   2005  w/ recurrent ;  documented sinus node dysfunction,   s/p PPM 2011  (03-20-2022  pt stated many yrs since last syncope)   History of transient ischemic attack (TIA)    Hyperlipidemia    Hypertension    Mild cognitive impairment    Nocturia    OA (osteoarthritis)    OSA on CPAP    followed by dr chalice;   study in epic 06-23-2021 severe complex apnea syndrome,   pt stated usese cpap nightly   Presence of permanent cardiac pacemaker    RLS (restless legs syndrome)    Rotator cuff tear arthropathy, right    Seizure disorder [facility]) 2021   neurologist--- dr rosemarie;   started 2021 nocturnal seizures;   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, not intractable , without status epilepticus  (03-20-2022  pt stated last seizure 01/ 2023)   Sinus node dysfunction [facility]) 2010   cardiologist--- dr fernande;  hx recurrent syncope;   04/ 2010  loop recorder implant w/ documented SND;   01-27-2010  s/p PPM dual chamber , St Jude   TIA (transient ischemic attack)    Wears glasses    Wears hearing aid in both ears    Past Surgical History:  Procedure Laterality Date   CARDIAC PACEMAKER PLACEMENT  01/27/2010   @[facility]  by dr fernande;   explant loop recorder /  PPM  St Jude dual chamber   COLONOSCOPY WITH PROPOFOL   08/07/2020   dr pyrle   ESOPHAGOGASTRODUODENOSCOPY (EGD) WITH PROPOFOL   02/05/2014   dr pyrtle   LOOP RECORDER IMPLANT  08/17/2008   @[facility]   by dr fernande;   after tilt table study normal;  for recurrent syncope   PPM GENERATOR CHANGEOUT N/A 12/02/2022   Procedure: PPM GENERATOR CHANGEOUT;  Surgeon: Fernande Elspeth BROCKS, MD;  Location: [facility] INVASIVE CV LAB;  Service: Cardiovascular;  Laterality: N/A;   TRANSURETHRAL RESECTION OF BLADDER TUMOR WITH MITOMYCIN -C N/A 03/23/2022   Procedure: TRANSURETHRAL RESECTION OF BLADDER TUMOR WITH POST OPERATIVE  GEMCITABINE ;  Surgeon: Matilda Senior, MD;  Location: [facility];  Service: Urology;  Laterality: N/A;  45 MINS   TRANSURETHRAL RESECTION OF BLADDER TUMOR WITH MITOMYCIN -C N/A 05/14/2022   Procedure: TRANSURETHRAL RESECTION OF BLADDER TUMOR WITH GEMCITABINE ;  Surgeon: Matilda Senior, MD;  Location: [facility];  Service: Urology;  Laterality: N/A;  30 MINS    reports that he quit smoking about 43 years ago. His smoking use included cigarettes. He started smoking about 68 years ago. He has never used smokeless tobacco. He reports current alcohol use of about 14.0 standard drinks of alcohol per week. He reports current drug use. Frequency: 7.00 times per week. Drug: Marijuana. family history includes Alcohol abuse in his  maternal aunt; Colitis in his father; Hypertension in his father and mother; Liver disease in his father; Other in his father; Stroke in his mother. No Known Allergies  Review of Systems  Constitutional:  Positive for malaise/fatigue. Negative for chills and fever.  HENT:  Negative for ear pain and sore throat.   Respiratory:  Positive for cough and sputum production. Negative for hemoptysis and wheezing.   Cardiovascular:  Negative for chest pain.      Objective:     BP 120/60   Pulse 83   Temp 97.6 F (36.4 C) (Oral)   Wt 145 lb 6.4 oz (66 kg)   SpO2 98%   BMI 22.77 kg/m  BP Readings from Last 3 Encounters:  02/15/24 120/60  12/28/23 (!) 122/52  12/23/23 112/72   Wt Readings from Last 3 Encounters:  02/15/24 145 lb 6.4 oz (66 kg)  12/28/23 147 lb (66.7 kg)  12/15/23 149 lb (67.6 kg)      Physical Exam Vitals reviewed.  HENT:     Mouth/Throat:     Mouth: Mucous membranes are moist.     Pharynx: Oropharynx is clear. No oropharyngeal exudate or posterior oropharyngeal erythema.  Cardiovascular:     Rate and Rhythm: Normal rate and regular rhythm.  Pulmonary:     Effort: Pulmonary effort is normal.     Breath sounds: Normal breath sounds. No wheezing or rales.  Neurological:     Mental Status: He is alert.      No results found for any visits on 02/15/24.    The ASCVD Risk score (Arnett DK, et al., 2019) failed to calculate for the following reasons:   The 2019 ASCVD risk score is only valid for ages 74 to 59    Assessment & Plan:   Problem List Items Addressed This Visit   None Visit Diagnoses       Acute cough    -  Primary     Mr. Nied is seen today with 2+ week history of productive cough.  He feels like he is getting worse if anything.  No fever.  Nonfocal exam.  Pulse ox 98% room air.  We explained this may all be viral but he has certainly increased risk with his age.  We agreed to go ahead and start Omnicef 300 mg twice daily for 7 days.   Continue over-the-counter plain Mucinex.  Continue good hydration.  Follow-up immediately for any fever, increased shortness of breath or other concerns.  No follow-ups on file.    Wolm Scarlet, MD

## 2024-02-16 ENCOUNTER — Ambulatory Visit: Admitting: Family Medicine

## 2024-02-18 NOTE — CV Procedure (Signed)
  Device system confirmed to be MRI conditional, with implant date > 6 weeks ago, and no evidence of abandoned or epicardial leads in review of most recent CXR  Device last cleared by EP Provider: Suzann Riddle 02/18/24  Clearance is good through for 1 year as long as parameters remain stable at time of check. If pt undergoes a cardiac device procedure during that time, they should be re-cleared.   Tachy-therapies to be programmed off if applicable with device back to pre-MRI settings after completion of exam.  Abbott/St Jude - [employer] will be present for programming for the MRI.   Rocky Catalan, RT  02/18/2024 9:44 AM

## 2024-02-21 ENCOUNTER — Ambulatory Visit (HOSPITAL_COMMUNITY)
Admission: RE | Admit: 2024-02-21 | Discharge: 2024-02-21 | Disposition: A | Discharge status: Home / Self Care | Source: Ambulatory Visit | Attending: Surgery | Admitting: Surgery

## 2024-02-21 DIAGNOSIS — M4722 Other spondylosis with radiculopathy, cervical region: Secondary | ICD-10-CM | POA: Insufficient documentation

## 2024-02-21 DIAGNOSIS — M4802 Spinal stenosis, cervical region: Secondary | ICD-10-CM | POA: Diagnosis not present

## 2024-02-21 NOTE — Progress Notes (Signed)
 Patient was monitored by this RN during MRI scan due to presence of a pacemaker. Cardiac rhythm was continuously monitored throughout the procedure. Prior to the start of the scan, the pacemaker was placed in MRI-safe mode by the pacemaker representative. Following the completion of the scan, the device was returned to its pre-MRI settings. Neurological status and orientation post-procedure were unchanged from baseline.   Pre-procedure Heart Rate (Prior to being placed in MRI safe mode): 50 Intra-MRI HR: 70 Post-procedure Heart Rate (Once pacemaker is returned to baseline mode):  56

## 2024-02-22 NOTE — Progress Notes (Deleted)
 [name] [address] [place] [address] Phone: [phone] Subjective:    I'm seeing this patient by the request  of:  [name] Wolm ORN, MD  CC:   [name]  12/23/2023 I discussed supplementation given today.  Discussed exercises, which activities to do which ones to avoid.  Discussed slow improvement noted.  Follow-up again in 2 to 3 months     Updated 02/24/2024 [name] [name] is a 83 y.o. male coming in with complaint of knee pain      Past Medical History:  Diagnosis Date   Bladder tumor    Cancer [facility])    Cardiac pacemaker in situ 01/27/2010   by [name] [name] for documented SND,  St Jude dual chamber   GERD (gastroesophageal reflux disease)    History of adenomatous polyp of colon    History of basal cell carcinoma (BCC) excision    forehead and nose area 2015, 2016   History of squamous cell carcinoma excision    excision bilateral temple area  2015, 2016, 2019   History of syncope 2005   2005  w/ recurrent ;  documented sinus node dysfunction,   s/p PPM 2011  (03-20-2022  pt stated many yrs since last syncope)   History of transient ischemic attack (TIA)    Hyperlipidemia    Hypertension    Mild cognitive impairment    Nocturia    OA (osteoarthritis)    OSA on CPAP    followed by [name] [name];   study in epic 06-23-2021 severe complex apnea syndrome,   pt stated usese cpap nightly   Presence of permanent cardiac pacemaker    RLS (restless legs syndrome)    Rotator cuff tear arthropathy, right    Seizure disorder [facility]) 2021   neurologist--- [name] [name];   started 2021 nocturnal seizures;   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, not intractable , without status epilepticus  (03-20-2022  pt stated last seizure 01/ 2023)   Sinus node dysfunction [facility]) 2010   cardiologist--- [name] [name];  hx recurrent syncope;   04/ 2010  loop recorder implant w/ documented SND;    01-27-2010  s/p PPM dual chamber , St Jude   TIA (transient ischemic attack)    Wears glasses    Wears hearing aid in both ears    Past Surgical History:  Procedure Laterality Date   CARDIAC PACEMAKER PLACEMENT  01/27/2010   @[facility]  by [name] [name];   explant loop recorder /  PPM  St Jude dual chamber   COLONOSCOPY WITH PROPOFOL   08/07/2020   [name] [name]   ESOPHAGOGASTRODUODENOSCOPY (EGD) WITH PROPOFOL   02/05/2014   [name] [name]   LOOP RECORDER IMPLANT  08/17/2008   @[facility]   by [name] [name];   after tilt table study normal;  for recurrent syncope   PPM GENERATOR CHANGEOUT N/A 12/02/2022   Procedure: PPM GENERATOR CHANGEOUT;  Surgeon: [name] Elspeth BROCKS, MD;  Location: [facility] INVASIVE CV LAB;  Service: Cardiovascular;  Laterality: N/A;   TRANSURETHRAL RESECTION OF BLADDER TUMOR WITH MITOMYCIN -C N/A 03/23/2022   Procedure: TRANSURETHRAL RESECTION OF BLADDER TUMOR WITH POST OPERATIVE GEMCITABINE ;  Surgeon: [name] Senior, MD;  Location: [facility];  Service: Urology;  Laterality: N/A;  45 MINS   TRANSURETHRAL RESECTION OF BLADDER TUMOR WITH MITOMYCIN -C N/A 05/14/2022   Procedure: TRANSURETHRAL RESECTION OF BLADDER TUMOR WITH GEMCITABINE ;  Surgeon: [name] Senior, MD;  Location: [facility];  Service: Urology;  Laterality: N/A;  30 MINS   Social History   Socioeconomic History   Marital status: Married    Spouse name: Peggy   Number of children: 1   Years of education: Not on file   Highest education level: Bachelor's degree (e.g., BA, AB, BS)  Occupational History   Occupation: CO-OWNER    Employer: [employer]   Tobacco Use   Smoking status: Former    Current packs/day: 0.00    Types: Cigarettes    Start date: 5    Quit date: 1982    Years since quitting: 43.8   Smokeless tobacco: Never  Vaping Use   Vaping status: Never Used  Substance and Sexual Activity   Alcohol use: Yes    Alcohol/week: 14.0 standard drinks of alcohol    Types: 14 Cans of beer per week    Comment:  03-20-2022 2 beers daily , 12 oz each   Drug use: Yes    Frequency: 7.0 times per week    Types: Marijuana    Comment: 03/22/2022   Sexual activity: Not on file  Other Topics Concern   Not on file  Social History Narrative   Lives with spouse   Right Handed   3-4 c caffeine    Social Drivers of [name]   Financial Resource Strain: Low Risk  (02/15/2024)   Overall Financial Resource Strain (CARDIA)    Difficulty of Paying Living Expenses: Not hard at all  Food Insecurity: No Food Insecurity (02/15/2024)   Hunger Vital Sign    Worried About Running Out of Food in the Last Year: Never true    Ran Out of Food in the Last Year: Never true  Transportation Needs: No Transportation Needs (02/15/2024)   PRAPARE - Administrator, Civil Service (Medical): No    Lack of Transportation (Non-Medical): No  Physical Activity: Insufficiently Active (02/15/2024)   Exercise Vital Sign    Days of Exercise per Week: 3 days    Minutes of Exercise per Session: 30 min  Stress: No Stress Concern Present (09/10/2023)   [name] [name] - Occupational Stress Questionnaire    Feeling of Stress : Not at all  Social Connections: Moderately Isolated (02/15/2024)   Social Connection and Isolation Panel    Frequency of Communication with Friends and Family: Three times a week    Frequency of Social Gatherings with Friends and Family: Twice a week    Attends Religious Services: Patient declined    Database Administrator or Organizations: No    Attends Engineer, Structural: Not on file    Marital Status: Married   No Known Allergies Family History  Problem Relation Age of Onset   Hypertension Mother    Stroke Mother    Colitis Father    Hypertension Father    Other Father        esophageal stricture   Liver disease Father    Alcohol abuse Maternal Aunt    Rectal cancer Neg Hx    Stomach cancer Neg Hx    Esophageal cancer Neg Hx    Colon polyps Neg Hx     Colon cancer Neg Hx      Current Outpatient Medications (Cardiovascular):    benazepril  (LOTENSIN ) 40 MG tablet, TAKE 1 TABLET BY MOUTH EVERY DAY   rosuvastatin  (CRESTOR ) 5 MG tablet, TAKE 1 TABLET BY MOUTH EVERY DAY (Patient taking differently: Take 2.5 mg by mouth daily.)  Current Outpatient Medications (Respiratory):    azelastine  (ASTELIN ) 0.1 %  nasal spray, Place 1 spray into both nostrils 2 (two) times daily. Use in each nostril as directed  Current Outpatient Medications (Analgesics):    acetaminophen  (TYLENOL ) 325 MG tablet, Take 2 tablets (650 mg total) by mouth every 6 (six) hours as needed for moderate pain (pain score 4-6).   Current Outpatient Medications (Other):    Ascorbic Acid (VITAMIN C PO), Take 1,000 mg by mouth daily.   calcium  citrate (CALCITRATE - DOSED IN MG ELEMENTAL CALCIUM ) 950 (200 Ca) MG tablet, Take 200 mg of elemental calcium  by mouth daily.   cefdinir (OMNICEF) 300 MG capsule, Take 1 capsule (300 mg total) by mouth 2 (two) times daily.   cholecalciferol (VITAMIN D3) 25 MCG (1000 UNIT) tablet, Take 1,000 Units by mouth daily.   donepezil  (ARICEPT ) 10 MG tablet, Take 1 tablet (10 mg total) by mouth at bedtime.   DULoxetine  (CYMBALTA ) 20 MG capsule, TAKE 1 CAPSULE BY MOUTH EVERY DAY   levETIRAcetam  (KEPPRA ) 500 MG tablet, Take one tablet in am and two tablets in pm.   NON FORMULARY, Pt uses a cpap nightly   traZODone  (DESYREL ) 100 MG tablet, Take 1 tablet (100 mg total) by mouth at bedtime as needed. for sleep   Turmeric (QC TUMERIC COMPLEX PO), Take 2 capsules by mouth daily.   Reviewed prior external information including notes and imaging from  primary care provider As well as notes that were available from care everywhere and other healthcare systems.  Past medical history, social, surgical and family history all reviewed in electronic medical record.  No pertanent information unless stated regarding to the chief complaint.   Review of Systems:  No  headache, visual changes, nausea, vomiting, diarrhea, constipation, dizziness, abdominal pain, skin rash, fevers, chills, night sweats, weight loss, swollen lymph nodes, body aches, joint swelling, chest pain, shortness of breath, mood changes. POSITIVE muscle aches  Objective  There were no vitals taken for this visit.   General: No apparent distress alert and oriented x3 mood and affect normal, dressed appropriately.  HEENT: Pupils equal, extraocular movements intact  Respiratory: Patient's speak in full sentences and does not appear short of breath  Cardiovascular: No lower extremity edema, non tender, no erythema      Impression and Recommendations:

## 2024-02-24 ENCOUNTER — Ambulatory Visit: Admitting: Family Medicine

## 2024-02-27 ENCOUNTER — Encounter (HOSPITAL_BASED_OUTPATIENT_CLINIC_OR_DEPARTMENT_OTHER): Payer: Self-pay

## 2024-02-27 ENCOUNTER — Emergency Department (HOSPITAL_BASED_OUTPATIENT_CLINIC_OR_DEPARTMENT_OTHER)
Admission: EM | Admit: 2024-02-27 | Discharge: 2024-02-27 | Disposition: A | Discharge status: Home / Self Care | Attending: Emergency Medicine | Admitting: Emergency Medicine

## 2024-02-27 ENCOUNTER — Other Ambulatory Visit: Payer: Self-pay

## 2024-02-27 ENCOUNTER — Emergency Department (HOSPITAL_BASED_OUTPATIENT_CLINIC_OR_DEPARTMENT_OTHER): Admitting: Radiology

## 2024-02-27 DIAGNOSIS — S60511A Abrasion of right hand, initial encounter: Secondary | ICD-10-CM

## 2024-02-27 DIAGNOSIS — S6991XA Unspecified injury of right wrist, hand and finger(s), initial encounter: Secondary | ICD-10-CM | POA: Diagnosis present

## 2024-02-27 DIAGNOSIS — Y9301 Activity, walking, marching and hiking: Secondary | ICD-10-CM | POA: Diagnosis not present

## 2024-02-27 DIAGNOSIS — S61411A Laceration without foreign body of right hand, initial encounter: Secondary | ICD-10-CM

## 2024-02-27 DIAGNOSIS — W1839XA Other fall on same level, initial encounter: Secondary | ICD-10-CM | POA: Insufficient documentation

## 2024-02-27 DIAGNOSIS — S61421A Laceration with foreign body of right hand, initial encounter: Secondary | ICD-10-CM | POA: Diagnosis not present

## 2024-02-27 NOTE — ED Triage Notes (Signed)
 Pt reports falling yesterday when he fell walking the dog. Pt reports injury to R hand. Pt reports striking head. Pt denies any blood thinners. Pt denies any LOC.

## 2024-02-27 NOTE — ED Provider Notes (Signed)
 [place] EMERGENCY DEPARTMENT AT [facility] Provider Note   CSN: [id number] Arrival date & time: 02/27/24  1353     Patient presents with: Fall and Hand Injury   [name] is a 83 y.o. male.   83 year old male presents to ED with complaints of right hand injury.  Patient reports he was walking his dog yesterday at 4 PM and had a mechanical fall.  He caught his fall with his right hand and fell forward onto the concrete.  Patient scraped his left cheek during the incident.  Patient denies blood thinners, dizziness, syncope, or any other injuries from the fall.  Patient was able to stand without assistance and was ambulatory after the fall.  Patient reports he is seen closely by orthopedic spine surgeon and has a follow-up appointment this upcoming week to go over MRI results.  Patient denies any symptoms today including headache, dizziness, blurry vision, chest pain, shortness of breath, weakness, or loss of function of his hand.     Prior to Admission medications   Medication Sig Start Date End Date Taking? Authorizing Provider  acetaminophen  (TYLENOL ) 325 MG tablet Take 2 tablets (650 mg total) by mouth every 6 (six) hours as needed for moderate pain (pain score 4-6). 11/16/23   Arrien, Mauricio Daniel, MD  Ascorbic Acid (VITAMIN C PO) Take 1,000 mg by mouth daily.    [provider]  azelastine  (ASTELIN ) 0.1 % nasal spray Place 1 spray into both nostrils 2 (two) times daily. Use in each nostril as directed 11/02/23   Burchette, Wolm ORN, MD  benazepril  (LOTENSIN ) 40 MG tablet TAKE 1 TABLET BY MOUTH EVERY DAY 11/29/20   Burchette, Wolm ORN, MD  calcium  citrate (CALCITRATE - DOSED IN MG ELEMENTAL CALCIUM ) 950 (200 Ca) MG tablet Take 200 mg of elemental calcium  by mouth daily.    [provider]  cefdinir (OMNICEF) 300 MG capsule Take 1 capsule (300 mg total) by mouth 2 (two) times daily. 02/15/24   Burchette, Wolm ORN, MD  cholecalciferol (VITAMIN D3) 25 MCG  (1000 UNIT) tablet Take 1,000 Units by mouth daily.    [provider]  donepezil  (ARICEPT ) 10 MG tablet Take 1 tablet (10 mg total) by mouth at bedtime. 09/01/23   Lomax, Amy, NP  DULoxetine  (CYMBALTA ) 20 MG capsule TAKE 1 CAPSULE BY MOUTH EVERY DAY 02/08/24   Smith, Zachary M, DO  levETIRAcetam  (KEPPRA ) 500 MG tablet Take one tablet in am and two tablets in pm. 04/29/21   Rosemarie Eather RAMAN, MD  NON FORMULARY Pt uses a cpap nightly    [provider]  rosuvastatin  (CRESTOR ) 5 MG tablet TAKE 1 TABLET BY MOUTH EVERY DAY Patient taking differently: Take 2.5 mg by mouth daily. 11/11/20   Burchette, Wolm ORN, MD  traZODone  (DESYREL ) 100 MG tablet Take 1 tablet (100 mg total) by mouth at bedtime as needed. for sleep 12/14/23   Lomax, Amy, NP  Turmeric (QC TUMERIC COMPLEX PO) Take 2 capsules by mouth daily.    [provider]    Allergies: Patient has no known allergies.    Review of Systems  Skin:  Positive for wound.  All other systems reviewed and are negative.   Updated Vital Signs BP (!) 140/72 (BP Location: Right Arm)   Pulse (!) 58   Temp 97.8 F (36.6 C) (Oral)   Resp 17   Ht 5' 7 (1.702 m)   Wt 65.8 kg   SpO2 100%   BMI 22.71 kg/m  Physical Exam Vitals and nursing note reviewed.  Constitutional:      Appearance: Normal appearance.  HENT:     Head: Normocephalic and atraumatic.     Nose: Nose normal.  Eyes:     Extraocular Movements: Extraocular movements intact.     Conjunctiva/sclera: Conjunctivae normal.     Pupils: Pupils are equal, round, and reactive to light.  Neck:     Comments: Some decreased range of motion in his neck which is chronic for patient.  He recently just had an MRI and is followed by neuro spine. Cardiovascular:     Rate and Rhythm: Normal rate.  Pulmonary:     Effort: Pulmonary effort is normal. No respiratory distress.  Abdominal:     General: Abdomen is flat.     Tenderness: There is no guarding.  Musculoskeletal:         General: Signs of injury present. No swelling. Normal range of motion.     Cervical back: Normal range of motion.     Right lower leg: No edema.     Left lower leg: No edema.     Comments: Small 3cm skin abrasion on the dorsal side of his rt hand.  On the medial side of the right hand there is some bruising with a shallow laceration.  Full wound can be visualized.  There is no active bleeding.   Skin:    General: Skin is warm.     Capillary Refill: Capillary refill takes less than 2 seconds.     Findings: Bruising present.  Neurological:     General: No focal deficit present.     Mental Status: He is alert.     Comments: No neurodeficits on exam.  Patient does have resting tremor which is chronic.  Psychiatric:        Mood and Affect: Mood normal.        Behavior: Behavior normal.     (all labs ordered are listed, but only abnormal results are displayed) Labs Reviewed - No data to display  EKG: None  Radiology: No results found.   .Laceration Repair  Date/Time: 02/27/2024 3:20 PM  Performed by: Myriam Fonda RAMAN, PA-C Authorized by: Myriam Fonda RAMAN, PA-C   Consent:    Consent obtained:  Verbal   Consent given by:  Patient   Risks, benefits, and alternatives were discussed: yes     Risks discussed:  Infection, need for additional repair, nerve damage, poor wound healing, poor cosmetic result, pain, retained foreign body, tendon damage and vascular damage   Alternatives discussed:  No treatment Universal protocol:    Procedure explained and questions answered to patient or proxy's satisfaction: yes     Relevant documents present and verified: yes     Site/side marked: yes     Immediately prior to procedure, a time out was called: yes     Patient identity confirmed:  Verbally with patient Anesthesia:    Anesthesia method:  None Laceration details:    Location:  Hand   Hand location:  R palm   Length (cm):  2   Depth (mm):  1 Pre-procedure details:     Preparation:  Patient was prepped and draped in usual sterile fashion Treatment:    Area cleansed with:  Saline   Amount of cleaning:  Standard   Irrigation solution:  Sterile saline   Irrigation volume:    Irrigation method:  Syringe Skin repair:    Repair method:  Steri-Strips   Number of  Steri-Strips:  2 Approximation:    Approximation:  Close Repair type:    Repair type:  Simple Post-procedure details:    Dressing:  Sterile dressing   Procedure completion:  Tolerated    Medications Ordered in the ED - No data to display  83 y.o. male presents to the ED with complaints of fall with injury to the rt hand, The differential diagnosis includes fracture, dislocation, skin abrasion/laceration, nerve injury, tendon injury, head injury, intracranial bleed.  (Ddx)  On arrival pt is nontoxic, vitals unremarkable. Exam significant for abrasion and shallow laceration on the right hand. Patient has abrasion on his left cheek as well.   Imaging Studies ordered:  Patient refused imaging.    ED Course:   Patient sitting comfortably in ED bed in no acute distress nontoxic-appearing.  Patient's wound was shallow and almost 24 hours since incident.  I do not believe it would benefit from suturing.  There was some bruising noted to the injury with no active bleeding.  Patient has full sensation, range of motion, and cap refill less than 2 seconds.  Wound was cleaned with 500 mL of sterile saline.  Wound was approximated with Steri-Strips and patient was given wound care instructions.  Patient was offered antibiotics and he declined and reported that he would follow-up with his primary care for reassessment this week.  The wound was wrapped and patient was given gauze and Steri-Strips for at home use if they were to fall off.  Patient was advised to follow-up with primary care for wound recheck.  Patient was given strict return precautions for any worsening symptoms and he agreed to treatment  plan.  Portions of this note were generated with Scientist, clinical (histocompatibility and immunogenetics). Dictation errors may occur despite best attempts at proofreading.   Final diagnoses:  Abrasion of right hand, initial encounter  Laceration of right hand, foreign body presence unspecified, initial encounter    ED Discharge Orders     None          Myriam Fonda GORMAN DEVONNA 02/27/24 1612    Dean Clarity, MD 02/28/24 [phone]

## 2024-02-27 NOTE — Discharge Instructions (Signed)
 Please clean the wound daily with warm soap and water .  I have given you extra bandages and Steri-Strips if the strips do fall off.  Please monitor for any symptoms of infection including warmth, redness, discharge.  If you experience any of these or other concerning symptoms please return to ED for further evaluation.  It is also advised to follow-up with primary care for a wound recheck this upcoming week.

## 2024-03-01 DIAGNOSIS — M4722 Other spondylosis with radiculopathy, cervical region: Secondary | ICD-10-CM | POA: Diagnosis not present

## 2024-03-02 ENCOUNTER — Ambulatory Visit (INDEPENDENT_AMBULATORY_CARE_PROVIDER_SITE_OTHER): Payer: Medicare Other

## 2024-03-02 DIAGNOSIS — I495 Sick sinus syndrome: Secondary | ICD-10-CM

## 2024-03-03 LAB — CUP PACEART REMOTE DEVICE CHECK
Battery Remaining Longevity: 119 mo
Battery Remaining Percentage: 94 %
Battery Voltage: 3.01 V
Brady Statistic AP VP Percent: 1 %
Brady Statistic AP VS Percent: 3.4 %
Brady Statistic AS VP Percent: 1 %
Brady Statistic AS VS Percent: 97 %
Brady Statistic RA Percent Paced: 3.1 %
Brady Statistic RV Percent Paced: 1 %
Date Time Interrogation Session: 20251113081608
Implantable Lead Connection Status: 753985
Implantable Lead Connection Status: 753985
Implantable Lead Implant Date: 20111010
Implantable Lead Implant Date: 20111010
Implantable Lead Location: 753859
Implantable Lead Location: 753860
Implantable Pulse Generator Implant Date: 20240814
Lead Channel Impedance Value: 260 Ohm
Lead Channel Impedance Value: 330 Ohm
Lead Channel Pacing Threshold Amplitude: 0.5 V
Lead Channel Pacing Threshold Amplitude: 1.25 V
Lead Channel Pacing Threshold Pulse Width: 0.4 ms
Lead Channel Pacing Threshold Pulse Width: 0.4 ms
Lead Channel Sensing Intrinsic Amplitude: 1.7 mV
Lead Channel Sensing Intrinsic Amplitude: 4.5 mV
Lead Channel Setting Pacing Amplitude: 2 V
Lead Channel Setting Pacing Amplitude: 2.5 V
Lead Channel Setting Pacing Pulse Width: 0.4 ms
Lead Channel Setting Sensing Sensitivity: 2 mV
Pulse Gen Model: 2272
Pulse Gen Serial Number: 8201207

## 2024-03-06 ENCOUNTER — Telehealth: Payer: Self-pay | Admitting: Family Medicine

## 2024-03-06 NOTE — Telephone Encounter (Signed)
 Patient is wanting to get schedule to get a gel injection. Please confirm when he could get one done. Right knee is really bothering him. Please advise.

## 2024-03-07 ENCOUNTER — Ambulatory Visit: Payer: Self-pay | Admitting: Cardiology

## 2024-03-07 NOTE — Progress Notes (Signed)
 Remote PPM Transmission

## 2024-03-15 DIAGNOSIS — M47812 Spondylosis without myelopathy or radiculopathy, cervical region: Secondary | ICD-10-CM | POA: Diagnosis not present

## 2024-03-21 NOTE — Progress Notes (Unsigned)
 [name] [address] [place] [address] Phone: [phone] Subjective:   [name] [name], am serving as a scribe for Dr. Arthea Claudene.  I'm seeing this patient by the request  of:  Micheal Wolm ORN, MD  CC: Right knee pain follow-up  [name]  [name] [name] is a 83 y.o. male coming in with complaint of R knee pain. Here for worsening knee pain.  Visco given September 2025. Patient states that he feels unstable.        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   by dr fernande for documented SND,  St Jude dual chamber   GERD (gastroesophageal reflux disease)    History of adenomatous polyp of colon    History of basal cell carcinoma (BCC) excision    forehead and nose area 2015, 2016   History of squamous cell carcinoma excision    excision bilateral temple area  2015, 2016, 2019   History of syncope 2005   2005  w/ recurrent ;  documented sinus node dysfunction,   s/p PPM 2011  (03-20-2022  pt stated many yrs since last syncope)   History of transient ischemic attack (TIA)    Hyperlipidemia    Hypertension    Mild cognitive impairment    Nocturia    OA (osteoarthritis)    OSA on CPAP    followed by dr chalice;   study in epic 06-23-2021 severe complex apnea syndrome,   pt stated usese cpap nightly   Presence of permanent cardiac pacemaker    RLS (restless legs syndrome)    Rotator cuff tear arthropathy, right    Seizure disorder [facility]) 2021   neurologist--- dr rosemarie;   started 2021 nocturnal seizures;   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, not intractable , without status epilepticus  (03-20-2022  pt stated last seizure 01/ 2023)   Sinus node dysfunction [facility]) 2010   cardiologist--- dr fernande;  hx recurrent syncope;   04/ 2010  loop recorder implant w/ documented SND;   01-27-2010  s/p PPM dual  chamber , St Jude   TIA (transient ischemic attack)    Wears glasses    Wears hearing aid in both ears    Past Surgical History:  Procedure Laterality Date   CARDIAC PACEMAKER PLACEMENT  01/27/2010   @[facility]  by dr fernande;   explant loop recorder /  PPM  St Jude dual chamber   COLONOSCOPY WITH PROPOFOL   08/07/2020   dr pyrle   ESOPHAGOGASTRODUODENOSCOPY (EGD) WITH PROPOFOL   02/05/2014   dr pyrtle   LOOP RECORDER IMPLANT  08/17/2008   @[facility]   by dr fernande;   after tilt table study normal;  for recurrent syncope   PPM GENERATOR CHANGEOUT N/A 12/02/2022   Procedure: PPM GENERATOR CHANGEOUT;  Surgeon: Fernande Elspeth BROCKS, MD;  Location: [facility] INVASIVE CV LAB;  Service: Cardiovascular;  Laterality: N/A;   TRANSURETHRAL RESECTION OF BLADDER TUMOR WITH MITOMYCIN -C N/A 03/23/2022   Procedure: TRANSURETHRAL RESECTION OF BLADDER TUMOR WITH POST OPERATIVE GEMCITABINE ;  Surgeon: Matilda Senior, MD;  Location[facility];  Service: Urology;  Laterality: N/A;  45 MINS   TRANSURETHRAL RESECTION OF BLADDER TUMOR WITH MITOMYCIN -C N/A 05/14/2022   Procedure: TRANSURETHRAL RESECTION OF BLADDER TUMOR WITH GEMCITABINE ;  Surgeon: Matilda Senior, MD;  Location: [facility];  Service: Urology;  Laterality: N/A;  30 MINS  Social History   Socioeconomic History   Marital status: Married    Spouse name: Peggy   Number of children: 1   Years of education: Not on file   Highest education level: Bachelor's degree (e.g., BA, AB, BS)  Occupational History   Occupation: CO-OWNER    Employer: [employer]   Tobacco Use   Smoking status: Former    Current packs/day: 0.00    Types: Cigarettes    Start date: 29    Quit date: 1982    Years since quitting: 43.9   Smokeless tobacco: Never  Vaping Use   Vaping status: Never Used  Substance and Sexual Activity   Alcohol use: Yes    Alcohol/week: 14.0 standard drinks of alcohol    Types: 14 Cans of beer per week    Comment: 03-20-2022 2 beers daily , 12 oz  each   Drug use: Yes    Frequency: 7.0 times per week    Types: Marijuana    Comment: 03/22/2022   Sexual activity: Not on file  Other Topics Concern   Not on file  Social History Narrative   Lives with spouse   Right Handed   3-4 c caffeine    Social Drivers of Health   Financial Resource Strain: Low Risk  (02/15/2024)   Overall Financial Resource Strain (CARDIA)    Difficulty of Paying Living Expenses: Not hard at all  Food Insecurity: No Food Insecurity (02/15/2024)   Hunger Vital Sign    Worried About Running Out of Food in the Last Year: Never true    Ran Out of Food in the Last Year: Never true  Transportation Needs: No Transportation Needs (02/15/2024)   PRAPARE - Administrator, Civil Service (Medical): No    Lack of Transportation (Non-Medical): No  Physical Activity: Insufficiently Active (02/15/2024)   Exercise Vital Sign    Days of Exercise per Week: 3 days    Minutes of Exercise per Session: 30 min  Stress: No Stress Concern Present (09/10/2023)   [employer] of Occupational Health - Occupational Stress Questionnaire    Feeling of Stress : Not at all  Social Connections: Moderately Isolated (02/15/2024)   Social Connection and Isolation Panel    Frequency of Communication with Friends and Family: Three times a week    Frequency of Social Gatherings with Friends and Family: Twice a week    Attends Religious Services: Patient declined    Database Administrator or Organizations: No    Attends Engineer, Structural: Not on file    Marital Status: Married   No Known Allergies Family History  Problem Relation Age of Onset   Hypertension Mother    Stroke Mother    Colitis Father    Hypertension Father    Other Father        esophageal stricture   Liver disease Father    Alcohol abuse Maternal Aunt    Rectal cancer Neg Hx    Stomach cancer Neg Hx    Esophageal cancer Neg Hx    Colon polyps Neg Hx    Colon cancer Neg Hx       Current Outpatient Medications (Cardiovascular):    benazepril  (LOTENSIN ) 40 MG tablet, TAKE 1 TABLET BY MOUTH EVERY DAY   rosuvastatin  (CRESTOR ) 5 MG tablet, TAKE 1 TABLET BY MOUTH EVERY DAY (Patient taking differently: Take 2.5 mg by mouth daily.)  Current Outpatient Medications (Respiratory):    azelastine  (ASTELIN ) 0.1 % nasal spray, Place  1 spray into both nostrils 2 (two) times daily. Use in each nostril as directed  Current Outpatient Medications (Analgesics):    acetaminophen  (TYLENOL ) 325 MG tablet, Take 2 tablets (650 mg total) by mouth every 6 (six) hours as needed for moderate pain (pain score 4-6).   Current Outpatient Medications (Other):    Ascorbic Acid (VITAMIN C PO), Take 1,000 mg by mouth daily.   calcium  citrate (CALCITRATE - DOSED IN MG ELEMENTAL CALCIUM ) 950 (200 Ca) MG tablet, Take 200 mg of elemental calcium  by mouth daily.   cefdinir  (OMNICEF ) 300 MG capsule, Take 1 capsule (300 mg total) by mouth 2 (two) times daily.   cholecalciferol (VITAMIN D3) 25 MCG (1000 UNIT) tablet, Take 1,000 Units by mouth daily.   donepezil  (ARICEPT ) 10 MG tablet, Take 1 tablet (10 mg total) by mouth at bedtime.   DULoxetine  (CYMBALTA ) 20 MG capsule, TAKE 1 CAPSULE BY MOUTH EVERY DAY   levETIRAcetam  (KEPPRA ) 500 MG tablet, Take one tablet in am and two tablets in pm.   NON FORMULARY, Pt uses a cpap nightly   traZODone  (DESYREL ) 100 MG tablet, Take 1 tablet (100 mg total) by mouth at bedtime as needed. for sleep   Turmeric (QC TUMERIC COMPLEX PO), Take 2 capsules by mouth daily.   Reviewed prior external information including notes and imaging from  primary care provider As well as notes that were available from care everywhere and other healthcare systems.  Past medical history, social, surgical and family history all reviewed in electronic medical record.  No pertanent information unless stated regarding to the chief complaint.   Review of Systems:  No headache, visual  changes, nausea, vomiting, diarrhea, constipation, dizziness, abdominal pain, skin rash, fevers, chills, night sweats, weight loss, swollen lymph nodes, body aches, joint swelling, chest pain, shortness of breath, mood changes. POSITIVE muscle aches  Objective  Blood pressure (!) 152/72, pulse (!) 56, height 5' 7 (1.702 m), weight 153 lb (69.4 kg), SpO2 98%.   General: No apparent distress alert and oriented x3 mood and affect normal, dressed appropriately.  HEENT: Pupils equal, extraocular movements intact  Respiratory: Patient's speak in full sentences and does not appear short of breath  Cardiovascular: No lower extremity edema, non tender, no erythema  Right knee significant instability noted with valgus and varus force.  Abnormal 5 to calf ratio noted.  Patient is ambulatory but antalgic gait noted  After informed written and verbal consent, patient was seated on exam table. Right knee was prepped with alcohol swab and utilizing anterolateral approach, patient's right knee space was injected with 4:1  marcaine 0.5%: Kenalog  40mg /dL. Patient tolerated the procedure well without immediate complications.    Impression and Recommendations:      The above documentation has been reviewed and is accurate and complete Susane Bey M Stefanee Mckell, DO

## 2024-03-22 ENCOUNTER — Ambulatory Visit: Payer: Self-pay | Admitting: Family Medicine

## 2024-03-22 ENCOUNTER — Encounter: Payer: Self-pay | Admitting: Family Medicine

## 2024-03-22 VITALS — BP 152/72 | HR 56 | Ht 67.0 in | Wt 153.0 lb

## 2024-03-22 DIAGNOSIS — M5030 Other cervical disc degeneration, unspecified cervical region: Secondary | ICD-10-CM

## 2024-03-22 DIAGNOSIS — M1711 Unilateral primary osteoarthritis, right knee: Secondary | ICD-10-CM | POA: Diagnosis not present

## 2024-03-22 NOTE — Assessment & Plan Note (Signed)
 Severe overall at this time.  Discussed with patient about the possibility of different treatment options.  Patient has elected to try another C7-T1 epidural and see if that would be beneficial.  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  No radicular symptoms at this time.  Will follow-up again when patient is back in town in March

## 2024-03-22 NOTE — Patient Instructions (Addendum)
 See us  after March 4th for gel

## 2024-03-22 NOTE — Assessment & Plan Note (Signed)
 Will see how patient does with the injection.  Is going to be living in [place]  for the next 2 to 3 months.  Will come back at that time and see if patient could be a candidate for viscosupplementation.  Increase activity slowly.  Discussed icing regimen.  Follow-up again in that time.SABRA

## 2024-06-26 ENCOUNTER — Ambulatory Visit: Admitting: Family Medicine

## 2024-07-11 ENCOUNTER — Ambulatory Visit: Admitting: Family Medicine
